# Patient Record
Sex: Male | Born: 1945
Health system: Southern US, Community
[De-identification: ages and names within clinical notes are randomized; demographics above are authoritative.]

## PROBLEM LIST (undated history)

## (undated) DIAGNOSIS — E785 Hyperlipidemia, unspecified: Secondary | ICD-10-CM

## (undated) DIAGNOSIS — I350 Nonrheumatic aortic (valve) stenosis: Secondary | ICD-10-CM

## (undated) DIAGNOSIS — K219 Gastro-esophageal reflux disease without esophagitis: Secondary | ICD-10-CM

## (undated) DIAGNOSIS — J45909 Unspecified asthma, uncomplicated: Secondary | ICD-10-CM

## (undated) DIAGNOSIS — M1990 Unspecified osteoarthritis, unspecified site: Secondary | ICD-10-CM

## (undated) DIAGNOSIS — I2510 Atherosclerotic heart disease of native coronary artery without angina pectoris: Secondary | ICD-10-CM

## (undated) DIAGNOSIS — E669 Obesity, unspecified: Secondary | ICD-10-CM

## (undated) DIAGNOSIS — D649 Anemia, unspecified: Secondary | ICD-10-CM

## (undated) DIAGNOSIS — N529 Male erectile dysfunction, unspecified: Secondary | ICD-10-CM

## (undated) DIAGNOSIS — K635 Polyp of colon: Secondary | ICD-10-CM

## (undated) DIAGNOSIS — N289 Disorder of kidney and ureter, unspecified: Secondary | ICD-10-CM

## (undated) DIAGNOSIS — G2581 Restless legs syndrome: Secondary | ICD-10-CM

## (undated) DIAGNOSIS — K5790 Diverticulosis of intestine, part unspecified, without perforation or abscess without bleeding: Secondary | ICD-10-CM

## (undated) DIAGNOSIS — K760 Fatty (change of) liver, not elsewhere classified: Secondary | ICD-10-CM

## (undated) DIAGNOSIS — R0989 Other specified symptoms and signs involving the circulatory and respiratory systems: Secondary | ICD-10-CM

## (undated) DIAGNOSIS — M109 Gout, unspecified: Secondary | ICD-10-CM

## (undated) DIAGNOSIS — I10 Essential (primary) hypertension: Secondary | ICD-10-CM

## (undated) HISTORY — DX: Unspecified osteoarthritis, unspecified site: M19.90

## (undated) HISTORY — DX: Diverticulosis of intestine, part unspecified, without perforation or abscess without bleeding: K57.90

## (undated) HISTORY — DX: Hyperlipidemia, unspecified: E78.5

## (undated) HISTORY — DX: Essential (primary) hypertension: I10

## (undated) HISTORY — DX: Male erectile dysfunction, unspecified: N52.9

## (undated) HISTORY — DX: Gout, unspecified: M10.9

## (undated) HISTORY — DX: Polyp of colon: K63.5

## (undated) HISTORY — DX: Unspecified asthma, uncomplicated: J45.909

## (undated) HISTORY — DX: Obesity, unspecified: E66.9

## (undated) HISTORY — DX: Gastro-esophageal reflux disease without esophagitis: K21.9

## (undated) HISTORY — DX: Disorder of kidney and ureter, unspecified: N28.9

## (undated) HISTORY — DX: Restless legs syndrome: G25.81

## (undated) HISTORY — DX: Fatty (change of) liver, not elsewhere classified: K76.0

## (undated) HISTORY — PX: LYMPH NODE BIOPSY: SHX201

## (undated) HISTORY — DX: Other specified symptoms and signs involving the circulatory and respiratory systems: R09.89

---

## 1999-04-19 ENCOUNTER — Encounter: Admission: RE | Admit: 1999-04-19 | Discharge: 1999-04-19 | Payer: Self-pay | Admitting: General Surgery

## 1999-04-19 ENCOUNTER — Encounter: Payer: Self-pay | Admitting: General Surgery

## 1999-04-20 ENCOUNTER — Ambulatory Visit (HOSPITAL_BASED_OUTPATIENT_CLINIC_OR_DEPARTMENT_OTHER): Admission: RE | Admit: 1999-04-20 | Discharge: 1999-04-20 | Payer: Self-pay | Admitting: General Surgery

## 1999-04-20 ENCOUNTER — Encounter (INDEPENDENT_AMBULATORY_CARE_PROVIDER_SITE_OTHER): Payer: Self-pay | Admitting: *Deleted

## 1999-05-05 ENCOUNTER — Encounter: Payer: Self-pay | Admitting: *Deleted

## 1999-05-05 ENCOUNTER — Emergency Department (HOSPITAL_COMMUNITY): Admission: EM | Admit: 1999-05-05 | Discharge: 1999-05-05 | Payer: Self-pay | Admitting: *Deleted

## 2000-05-28 ENCOUNTER — Encounter: Payer: Self-pay | Admitting: Family Medicine

## 2000-05-28 ENCOUNTER — Encounter: Admission: RE | Admit: 2000-05-28 | Discharge: 2000-05-28 | Payer: Self-pay | Admitting: Family Medicine

## 2000-11-04 ENCOUNTER — Encounter: Admission: RE | Admit: 2000-11-04 | Discharge: 2000-11-04 | Payer: Self-pay | Admitting: Family Medicine

## 2000-11-04 ENCOUNTER — Encounter: Payer: Self-pay | Admitting: Family Medicine

## 2004-08-15 ENCOUNTER — Ambulatory Visit: Payer: Self-pay | Admitting: Family Medicine

## 2004-08-21 ENCOUNTER — Ambulatory Visit: Payer: Self-pay | Admitting: Internal Medicine

## 2004-08-21 ENCOUNTER — Encounter: Admission: RE | Admit: 2004-08-21 | Discharge: 2004-08-21 | Payer: Self-pay | Admitting: Family Medicine

## 2004-08-29 ENCOUNTER — Ambulatory Visit: Payer: Self-pay | Admitting: Family Medicine

## 2004-09-04 ENCOUNTER — Ambulatory Visit: Payer: Self-pay | Admitting: Internal Medicine

## 2004-10-31 ENCOUNTER — Ambulatory Visit: Payer: Self-pay | Admitting: Family Medicine

## 2005-09-18 ENCOUNTER — Ambulatory Visit: Payer: Self-pay | Admitting: Family Medicine

## 2005-09-24 ENCOUNTER — Ambulatory Visit: Payer: Self-pay | Admitting: Family Medicine

## 2006-04-07 ENCOUNTER — Ambulatory Visit: Payer: Self-pay | Admitting: Family Medicine

## 2006-04-10 ENCOUNTER — Ambulatory Visit: Payer: Self-pay | Admitting: Family Medicine

## 2006-04-17 ENCOUNTER — Ambulatory Visit: Payer: Self-pay | Admitting: Family Medicine

## 2006-04-29 ENCOUNTER — Ambulatory Visit: Payer: Self-pay | Admitting: Family Medicine

## 2006-05-01 ENCOUNTER — Ambulatory Visit: Payer: Self-pay | Admitting: Family Medicine

## 2006-05-05 ENCOUNTER — Ambulatory Visit: Payer: Self-pay | Admitting: Family Medicine

## 2006-05-05 ENCOUNTER — Ambulatory Visit: Payer: Self-pay

## 2006-05-05 LAB — CONVERTED CEMR LAB
BUN: 15 mg/dL (ref 6–23)
Creatinine, Ser: 1.3 mg/dL (ref 0.4–1.5)
Potassium: 4.2 meq/L (ref 3.5–5.1)

## 2006-05-08 ENCOUNTER — Ambulatory Visit: Payer: Self-pay | Admitting: Cardiovascular Disease

## 2006-05-08 ENCOUNTER — Ambulatory Visit: Payer: Self-pay | Admitting: Family Medicine

## 2006-05-20 ENCOUNTER — Ambulatory Visit: Payer: Self-pay | Admitting: Family Medicine

## 2006-05-28 ENCOUNTER — Ambulatory Visit: Payer: Self-pay | Admitting: Family Medicine

## 2006-07-28 ENCOUNTER — Ambulatory Visit: Payer: Self-pay | Admitting: Family Medicine

## 2006-11-27 ENCOUNTER — Encounter: Payer: Self-pay | Admitting: Family Medicine

## 2006-11-27 DIAGNOSIS — E785 Hyperlipidemia, unspecified: Secondary | ICD-10-CM | POA: Insufficient documentation

## 2006-11-27 DIAGNOSIS — I10 Essential (primary) hypertension: Secondary | ICD-10-CM

## 2006-11-27 DIAGNOSIS — M1990 Unspecified osteoarthritis, unspecified site: Secondary | ICD-10-CM | POA: Insufficient documentation

## 2006-11-27 DIAGNOSIS — J309 Allergic rhinitis, unspecified: Secondary | ICD-10-CM

## 2006-11-27 DIAGNOSIS — K219 Gastro-esophageal reflux disease without esophagitis: Secondary | ICD-10-CM

## 2007-03-03 ENCOUNTER — Telehealth: Payer: Self-pay | Admitting: Family Medicine

## 2007-04-06 ENCOUNTER — Ambulatory Visit: Payer: Self-pay | Admitting: Family Medicine

## 2007-04-06 DIAGNOSIS — R609 Edema, unspecified: Secondary | ICD-10-CM

## 2007-05-22 ENCOUNTER — Ambulatory Visit: Payer: Self-pay | Admitting: Family Medicine

## 2007-05-22 LAB — CONVERTED CEMR LAB
ALT: 21 units/L (ref 0–53)
AST: 22 units/L (ref 0–37)
Basophils Relative: 0.4 % (ref 0.0–1.0)
Bilirubin, Direct: 0.2 mg/dL (ref 0.0–0.3)
CO2: 32 meq/L (ref 19–32)
Calcium: 9.2 mg/dL (ref 8.4–10.5)
Chloride: 105 meq/L (ref 96–112)
Eosinophils Relative: 7.2 % — ABNORMAL HIGH (ref 0.0–5.0)
Glucose, Bld: 138 mg/dL — ABNORMAL HIGH (ref 70–99)
HDL: 36.1 mg/dL — ABNORMAL LOW (ref >39.0)
Ketones, urine, test strip: NEGATIVE
Lymphocytes Relative: 41.6 % (ref 12.0–46.0)
Neutro Abs: 2.2 10*3/uL (ref 1.4–7.7)
Nitrite: NEGATIVE
Platelets: 187 10*3/uL (ref 150–400)
RBC: 4.1 M/uL — ABNORMAL LOW (ref 4.22–5.81)
Total Protein: 5.9 g/dL — ABNORMAL LOW (ref 6.0–8.3)
Urobilinogen, UA: 0.2
VLDL: 25 mg/dL (ref 0–40)
WBC Urine, dipstick: NEGATIVE
WBC: 5.3 10*3/uL (ref 4.5–10.5)

## 2007-06-05 ENCOUNTER — Ambulatory Visit: Payer: Self-pay | Admitting: Family Medicine

## 2007-07-07 ENCOUNTER — Ambulatory Visit: Payer: Self-pay | Admitting: Family Medicine

## 2007-07-07 DIAGNOSIS — H6120 Impacted cerumen, unspecified ear: Secondary | ICD-10-CM

## 2007-07-14 ENCOUNTER — Telehealth (INDEPENDENT_AMBULATORY_CARE_PROVIDER_SITE_OTHER): Payer: Self-pay | Admitting: *Deleted

## 2007-08-18 ENCOUNTER — Telehealth: Payer: Self-pay | Admitting: Family Medicine

## 2007-08-24 ENCOUNTER — Telehealth (INDEPENDENT_AMBULATORY_CARE_PROVIDER_SITE_OTHER): Payer: Self-pay | Admitting: *Deleted

## 2007-08-26 ENCOUNTER — Telehealth (INDEPENDENT_AMBULATORY_CARE_PROVIDER_SITE_OTHER): Payer: Self-pay | Admitting: *Deleted

## 2007-09-08 ENCOUNTER — Telehealth (INDEPENDENT_AMBULATORY_CARE_PROVIDER_SITE_OTHER): Payer: Self-pay | Admitting: *Deleted

## 2007-10-29 ENCOUNTER — Ambulatory Visit: Payer: Self-pay | Admitting: Family Medicine

## 2007-11-03 ENCOUNTER — Encounter: Payer: Self-pay | Admitting: Family Medicine

## 2007-11-09 ENCOUNTER — Encounter: Payer: Self-pay | Admitting: Family Medicine

## 2007-12-02 ENCOUNTER — Encounter: Payer: Self-pay | Admitting: Family Medicine

## 2007-12-22 ENCOUNTER — Ambulatory Visit: Payer: Self-pay | Admitting: Internal Medicine

## 2008-01-04 ENCOUNTER — Ambulatory Visit: Payer: Self-pay | Admitting: Family Medicine

## 2008-01-07 ENCOUNTER — Telehealth: Payer: Self-pay | Admitting: Family Medicine

## 2008-04-15 ENCOUNTER — Telehealth: Payer: Self-pay | Admitting: *Deleted

## 2008-04-22 ENCOUNTER — Ambulatory Visit: Payer: Self-pay | Admitting: Family Medicine

## 2008-04-22 LAB — CONVERTED CEMR LAB
Direct LDL: 160 mg/dL
Ferritin: 29.5 ng/mL (ref 22.0–322.0)
Total CHOL/HDL Ratio: 6.6
VLDL: 27 mg/dL (ref 0–40)

## 2008-04-25 ENCOUNTER — Telehealth: Payer: Self-pay | Admitting: *Deleted

## 2008-05-03 ENCOUNTER — Telehealth: Payer: Self-pay | Admitting: Family Medicine

## 2008-05-23 ENCOUNTER — Telehealth: Payer: Self-pay | Admitting: Family Medicine

## 2008-05-31 ENCOUNTER — Telehealth: Payer: Self-pay | Admitting: Family Medicine

## 2008-06-15 ENCOUNTER — Telehealth: Payer: Self-pay | Admitting: *Deleted

## 2008-06-20 ENCOUNTER — Telehealth: Payer: Self-pay | Admitting: *Deleted

## 2008-06-23 ENCOUNTER — Telehealth: Payer: Self-pay | Admitting: *Deleted

## 2008-08-31 ENCOUNTER — Telehealth: Payer: Self-pay | Admitting: Family Medicine

## 2008-09-19 ENCOUNTER — Telehealth: Payer: Self-pay | Admitting: Family Medicine

## 2008-09-22 ENCOUNTER — Telehealth: Payer: Self-pay | Admitting: Family Medicine

## 2008-09-27 ENCOUNTER — Telehealth: Payer: Self-pay | Admitting: Family Medicine

## 2008-09-28 ENCOUNTER — Telehealth: Payer: Self-pay | Admitting: Family Medicine

## 2008-10-24 ENCOUNTER — Telehealth: Payer: Self-pay | Admitting: Family Medicine

## 2008-10-26 ENCOUNTER — Ambulatory Visit: Payer: Self-pay | Admitting: Family Medicine

## 2008-10-26 LAB — CONVERTED CEMR LAB
AST: 25 units/L (ref 0–37)
Albumin: 3.8 g/dL (ref 3.5–5.2)
Alkaline Phosphatase: 47 units/L (ref 39–117)
BUN: 23 mg/dL (ref 6–23)
Basophils Absolute: 0 10*3/uL (ref 0.0–0.1)
Bilirubin, Direct: 0.1 mg/dL (ref 0.0–0.3)
Blood in Urine, dipstick: NEGATIVE
Chloride: 112 meq/L (ref 96–112)
Cholesterol: 227 mg/dL — ABNORMAL HIGH (ref 0–200)
Creatinine, Ser: 1.2 mg/dL (ref 0.4–1.5)
Eosinophils Absolute: 0.3 10*3/uL (ref 0.0–0.7)
Glucose, Bld: 128 mg/dL — ABNORMAL HIGH (ref 70–99)
HCT: 34.1 % — ABNORMAL LOW (ref 39.0–52.0)
Lymphs Abs: 1.7 10*3/uL (ref 0.7–4.0)
MCHC: 34.2 g/dL (ref 30.0–36.0)
MCV: 88.4 fL (ref 78.0–100.0)
Monocytes Absolute: 0.5 10*3/uL (ref 0.1–1.0)
Monocytes Relative: 8.2 % (ref 3.0–12.0)
Neutro Abs: 3 10*3/uL (ref 1.4–7.7)
Nitrite: NEGATIVE
Platelets: 164 10*3/uL (ref 150.0–400.0)
RDW: 12.2 % (ref 11.5–14.6)
Specific Gravity, Urine: 1.025
TSH: 1.45 microintl units/mL (ref 0.35–5.50)
Total CHOL/HDL Ratio: 6
WBC Urine, dipstick: NEGATIVE

## 2008-11-08 ENCOUNTER — Ambulatory Visit: Payer: Self-pay | Admitting: Family Medicine

## 2008-11-08 DIAGNOSIS — R0989 Other specified symptoms and signs involving the circulatory and respiratory systems: Secondary | ICD-10-CM

## 2009-02-07 ENCOUNTER — Telehealth: Payer: Self-pay | Admitting: Family Medicine

## 2009-02-08 ENCOUNTER — Encounter: Payer: Self-pay | Admitting: *Deleted

## 2009-03-23 ENCOUNTER — Ambulatory Visit: Payer: Self-pay | Admitting: Family Medicine

## 2009-03-23 DIAGNOSIS — G2581 Restless legs syndrome: Secondary | ICD-10-CM

## 2009-03-23 DIAGNOSIS — N529 Male erectile dysfunction, unspecified: Secondary | ICD-10-CM

## 2009-05-05 ENCOUNTER — Ambulatory Visit: Payer: Self-pay | Admitting: Family Medicine

## 2009-08-03 ENCOUNTER — Encounter (INDEPENDENT_AMBULATORY_CARE_PROVIDER_SITE_OTHER): Payer: Self-pay | Admitting: *Deleted

## 2009-08-10 ENCOUNTER — Telehealth: Payer: Self-pay | Admitting: Family Medicine

## 2009-08-15 ENCOUNTER — Telehealth: Payer: Self-pay | Admitting: Family Medicine

## 2009-11-15 ENCOUNTER — Ambulatory Visit: Payer: Self-pay | Admitting: Family Medicine

## 2009-11-15 LAB — CONVERTED CEMR LAB
ALT: 27 units/L (ref 0–53)
AST: 26 units/L (ref 0–37)
BUN: 18 mg/dL (ref 6–23)
Basophils Relative: 1 % (ref 0.0–3.0)
Bilirubin, Direct: 0.1 mg/dL (ref 0.0–0.3)
Blood in Urine, dipstick: NEGATIVE
Chloride: 106 meq/L (ref 96–112)
Creatinine, Ser: 1.1 mg/dL (ref 0.4–1.5)
Eosinophils Relative: 6.4 % — ABNORMAL HIGH (ref 0.0–5.0)
GFR calc non Af Amer: 75.47 mL/min (ref >60)
HCT: 33.5 % — ABNORMAL LOW (ref 39.0–52.0)
HDL: 41.8 mg/dL (ref >39.00)
MCV: 87.6 fL (ref 78.0–100.0)
Monocytes Absolute: 0.6 10*3/uL (ref 0.1–1.0)
Monocytes Relative: 9 % (ref 3.0–12.0)
Neutrophils Relative %: 53.3 % (ref 43.0–77.0)
Nitrite: NEGATIVE
Platelets: 189 10*3/uL (ref 150.0–400.0)
Potassium: 4.5 meq/L (ref 3.5–5.1)
RBC: 3.83 M/uL — ABNORMAL LOW (ref 4.22–5.81)
Total Bilirubin: 0.7 mg/dL (ref 0.3–1.2)
Total CHOL/HDL Ratio: 6
Total Protein: 6.3 g/dL (ref 6.0–8.3)
Urobilinogen, UA: 0.2
VLDL: 31.2 mg/dL (ref 0.0–40.0)
WBC Urine, dipstick: NEGATIVE
WBC: 6.1 10*3/uL (ref 4.5–10.5)

## 2009-11-23 ENCOUNTER — Ambulatory Visit: Payer: Self-pay | Admitting: Family Medicine

## 2009-12-14 ENCOUNTER — Telehealth: Payer: Self-pay | Admitting: Family Medicine

## 2009-12-18 ENCOUNTER — Ambulatory Visit: Payer: Self-pay | Admitting: Family Medicine

## 2009-12-18 DIAGNOSIS — D631 Anemia in chronic kidney disease: Secondary | ICD-10-CM | POA: Insufficient documentation

## 2009-12-18 DIAGNOSIS — N189 Chronic kidney disease, unspecified: Secondary | ICD-10-CM

## 2009-12-19 LAB — CONVERTED CEMR LAB
BUN: 25 mg/dL — ABNORMAL HIGH (ref 6–23)
CO2: 28 meq/L (ref 19–32)
Chloride: 113 meq/L — ABNORMAL HIGH (ref 96–112)
Creatinine, Ser: 1.5 mg/dL (ref 0.4–1.5)
Glucose, Bld: 103 mg/dL — ABNORMAL HIGH (ref 70–99)
Potassium: 4.6 meq/L (ref 3.5–5.1)
Saturation Ratios: 10.3 % — ABNORMAL LOW (ref 20.0–50.0)
Transferrin: 276.7 mg/dL (ref 212.0–360.0)

## 2010-01-11 ENCOUNTER — Encounter (HOSPITAL_COMMUNITY): Admission: RE | Admit: 2010-01-11 | Discharge: 2010-01-16 | Payer: Self-pay | Admitting: Nephrology

## 2010-04-09 ENCOUNTER — Telehealth: Payer: Self-pay | Admitting: Family Medicine

## 2010-04-12 ENCOUNTER — Encounter (HOSPITAL_COMMUNITY)
Admission: RE | Admit: 2010-04-12 | Discharge: 2010-07-03 | Payer: Self-pay | Source: Home / Self Care | Attending: Nephrology | Admitting: Nephrology

## 2010-05-07 ENCOUNTER — Encounter (INDEPENDENT_AMBULATORY_CARE_PROVIDER_SITE_OTHER): Payer: Self-pay | Admitting: *Deleted

## 2010-06-03 LAB — HM COLONOSCOPY

## 2010-06-11 ENCOUNTER — Encounter (INDEPENDENT_AMBULATORY_CARE_PROVIDER_SITE_OTHER): Payer: Self-pay | Admitting: *Deleted

## 2010-06-13 ENCOUNTER — Telehealth: Payer: Self-pay | Admitting: Family Medicine

## 2010-06-15 ENCOUNTER — Ambulatory Visit
Admission: RE | Admit: 2010-06-15 | Discharge: 2010-06-15 | Payer: Self-pay | Source: Home / Self Care | Attending: Internal Medicine | Admitting: Internal Medicine

## 2010-06-19 ENCOUNTER — Telehealth: Payer: Self-pay | Admitting: Family Medicine

## 2010-06-26 ENCOUNTER — Other Ambulatory Visit: Payer: Self-pay | Admitting: Internal Medicine

## 2010-06-26 ENCOUNTER — Ambulatory Visit
Admission: RE | Admit: 2010-06-26 | Discharge: 2010-06-26 | Payer: Self-pay | Source: Home / Self Care | Attending: Internal Medicine | Admitting: Internal Medicine

## 2010-07-02 ENCOUNTER — Encounter: Payer: Self-pay | Admitting: Internal Medicine

## 2010-07-03 NOTE — Therapy (Signed)
Summary: Audiology Evaluation/[facility]  Audiology Evaluation/[facility]   Imported By: Maryln Gottron 02/15/2010 14:47:54  _____________________________________________________________________  External Attachment:    Type:   Image     Comment:   External Document

## 2010-07-03 NOTE — Assessment & Plan Note (Signed)
Summary: CPX // RS   Vital Signs:  Patient profile:   65 year old male Height:      65.5 inches Weight:      259 pounds BMI:     42.60 Temp:     98.8 degrees F oral BP sitting:   140 / 72  (left arm) Cuff size:   regular  Vitals Entered By: Kern Reap CMA Duncan Dull) (November 23, 2009 3:03 PM) CC: cpx Is Patient Diabetic? No   CC:  cpx.  History of Present Illness: Todd Spears is a 65 year old male, who comes in today for evaluation of multiple issues.  He has underlying hypertension, for which he takes Cardura 8 mg daily, labetalol 400 mg b.i.d., Lasix, 40 mg t.i.d., Zestril 80 mg daily, clonidine, .1, if his BP goes over 170,..exforge -- 320 daily.  He also sees the renal folks Dr. Gabriel Rung  He takes Nasonex nasal spray for allergic rhinitis, along with Zyrtec 10 mg plane nightly  He also uses Nexium 40 mg daily for reflux esophagitis.  He uses Viagra 50 mg p.r.n. for ED.  He also takes frequent to 1 mg daily however, he states is not working very well.  We discussed increasing the dose.  He also takes tramadol 50 mg two tabs t.i.d. p.r.n. from his orthopedist.  Tetanus 2004 seasonal flu 2010 Pneumovax 2006 information given on shingles  Allergies (verified): No Known Drug Allergies  Past History:  Past medical, surgical, family and social histories (including risk factors) reviewed, and no changes noted (except as noted below).  Past Medical History: Reviewed history from 11/27/2006 and no changes required. Allergic rhinitis GERD Hyperlipidemia Hypertension Osteoarthritis ED R CAROTID BRUIT  Past Surgical History: Reviewed history from 11/27/2006 and no changes required. LYMPHOMA L SIDE OF WUJW11'  Family History: Reviewed history from 04/06/2007 and no changes required. Family History Hypertension Family History of Stroke F 1st degree relative <60  Social History: Reviewed history from 04/06/2007 and no changes required. Retired Married Never Smoked Alcohol  use-no Drug use-no Regular exercise-no  Review of Systems      See HPI  Physical Exam  General:  Well-developed,well-nourished,in no acute distress; alert,appropriate and cooperative throughout examination Head:  Normocephalic and atraumatic without obvious abnormalities. No apparent alopecia or balding. Eyes:  No corneal or conjunctival inflammation noted. EOMI. Perrla. Funduscopic exam benign, without hemorrhages, exudates or papilledema. Vision grossly normal. Ears:  External ear exam shows no significant lesions or deformities.  Otoscopic examination reveals clear canals, tympanic membranes are intact bilaterally without bulging, retraction, inflammation or discharge. Hearing is grossly normal bilaterally. Nose:  External nasal examination shows no deformity or inflammation. Nasal mucosa are pink and moist without lesions or exudates. Mouth:  Oral mucosa and oropharynx without lesions or exudates.  Teeth in good repair. Neck:  No deformities, masses, or tenderness noted. Chest Wall:  No deformities, masses, tenderness or gynecomastia noted. Breasts:  No masses or gynecomastia noted Lungs:  Normal respiratory effort, chest expands symmetrically. Lungs are clear to auscultation, no crackles or wheezes. Heart:  Normal rate and regular rhythm. S1 and S2 normal without gallop, murmur, click, rub or other extra sounds. Abdomen:  Bowel sounds positive,abdomen soft and non-tender without masses, organomegaly or hernias noted. Rectal:  No external abnormalities noted. Normal sphincter tone. No rectal masses or tenderness. Genitalia:  Testes bilaterally descended without nodularity, tenderness or masses. No scrotal masses or lesions. No penis lesions or urethral discharge. Prostate:  Prostate gland firm and smooth, no enlargement, nodularity, tenderness,  mass, asymmetry or induration. Msk:  No deformity or scoliosis noted of thoracic or lumbar spine.   Pulses:  R and L  carotid,radial,femoral,dorsalis pedis and posterior tibial pulses are full and equal bilaterally Extremities:  No clubbing, cyanosis, edema, or deformity noted with normal full range of motion of all joints.   Neurologic:  No cranial nerve deficits noted. Station and gait are normal. Plantar reflexes are down-going bilaterally. DTRs are symmetrical throughout. Sensory, motor and coordinative functions appear intact. Skin:  Intact without suspicious lesions or rashes Cervical Nodes:  No lymphadenopathy noted Axillary Nodes:  No palpable lymphadenopathy Inguinal Nodes:  No significant adenopathy Psych:  Cognition and judgment appear intact. Alert and cooperative with normal attention span and concentration. No apparent delusions, illusions, hallucinations   Impression & Recommendations:  Problem # 1:  PHYSICAL EXAMINATION (ICD-V70.0) Assessment Unchanged  Orders: Prescription Created Electronically [phone])  Problem # 2:  ERECTILE DYSFUNCTION, ORGANIC (ICD-607.84) Assessment: Improved  His updated medication list for this problem includes:    Viagra 50 Mg Tabs (Sildenafil citrate) ..... Uad  Orders: Prescription Created Electronically [phone])  Problem # 3:  RESTLESS LEG SYNDROME (ICD-333.94) Assessment: Deteriorated  Orders: Prescription Created Electronically [phone])  Problem # 4:  CAROTID BRUIT, RIGHT (ICD-785.9) Assessment: Unchanged  Orders: Prescription Created Electronically [phone])  Problem # 5:  HYPERTENSION (ICD-401.9) Assessment: Improved  His updated medication list for this problem includes:    Doxazosin Mesylate 8 Mg Tabs (Doxazosin mesylate) .Marland Kitchen... Take 1 tablet by mouth once a day    Labetalol Hcl 200 Mg Tabs (Labetalol hcl) .Marland Kitchen... 2 tablet by mouth twice a day    Lasix 40 Mg Tabs (Furosemide) .Marland Kitchen... Take 1 tablet by mouth three times a day    Zestril 40 Mg Tabs (Lisinopril) .Marland Kitchen... Take 1 tablet by mouth two times a day    Clonidine Hcl 0.1 Mg Tabs (Clonidine  hcl) ..... One tablet every hour for systolic blood pressure in excess of 170    Exforge 10-320 Mg Tabs (Amlodipine besylate-valsartan) .Marland Kitchen... Take one tablet by mouth once daily  Orders: Prescription Created Electronically [phone]) EKG w/ Interpretation (93000)  Problem # 6:  GERD (ICD-530.81) Assessment: Improved  His updated medication list for this problem includes:    Nexium 40 Mg Cpdr (Esomeprazole magnesium) .Marland Kitchen... Take one tab by mouth once daily  Orders: Prescription Created Electronically [phone])  Complete Medication List: 1)  Aspir-81 81 Mg Tbec (Aspirin) .... Take 1 tablet by mouth once a day 2)  Doxazosin Mesylate 8 Mg Tabs (Doxazosin mesylate) .... Take 1 tablet by mouth once a day 3)  Labetalol Hcl 200 Mg Tabs (Labetalol hcl) .... 2 tablet by mouth twice a day 4)  Lasix 40 Mg Tabs (Furosemide) .... Take 1 tablet by mouth three times a day 5)  Nasonex 50 Mcg/act Susp (Mometasone furoate) .... Spray 2 spray as directed once a day 6)  Nexium 40 Mg Cpdr (Esomeprazole magnesium) .... Take one tab by mouth once daily 7)  Zyrtec Allergy 10 Mg Tabs (Cetirizine hcl) .... Once daily 8)  Zestril 40 Mg Tabs (Lisinopril) .... Take 1 tablet by mouth two times a day 9)  Clonidine Hcl 0.1 Mg Tabs (Clonidine hcl) .... One tablet every hour for systolic blood pressure in excess of 170 10)  Exforge 10-320 Mg Tabs (Amlodipine besylate-valsartan) .... Take one tablet by mouth once daily 11)  Meloxicam 15 Mg Tabs (Meloxicam) .... Take one tab once daily 12)  Anucort-hc 25 Mg Supp (Hydrocortisone acetate) .... Insert  one at bedtime 13)  Viagra 50 Mg Tabs (Sildenafil citrate) .... Uad 14)  Tramadol Hcl 50 Mg Tabs (Tramadol hcl) .... Take 2 tabs every 6-8 hours as needed pain 15)  Metaxalone 800 Mg Tabs (Metaxalone) .... Take one tab by mouth two times a day 16)  Requip 2 Mg Tabs (Ropinirole hcl) .Marland Kitchen.. 1 tab @ bedtime  Patient Instructions: 1)  Please schedule a follow-up appointment in 1  year. 2)  It is important that you exercise regularly at least 20 minutes 5 times a week. If you develop chest pain, have severe difficulty breathing, or feel very tired , stop exercising immediately and seek medical attention. Prescriptions: REQUIP 2 MG TABS (ROPINIROLE HCL) 1 tab @ bedtime  #100 x 3   Entered and Authorized by:   Roderick Pee MD   Signed by:   Roderick Pee MD on 11/23/2009   Method used:   Print then Give to Patient   RxID:   [id number] VIAGRA 50 MG TABS (SILDENAFIL CITRATE) UAD  #6 x 11   Entered and Authorized by:   Roderick Pee MD   Signed by:   Roderick Pee MD on 11/23/2009   Method used:   Print then Give to Patient   RxID:   [id number] EXFORGE 10-320 MG TABS (AMLODIPINE BESYLATE-VALSARTAN) take one tablet by mouth once daily  #100 x 3   Entered and Authorized by:   Roderick Pee MD   Signed by:   Roderick Pee MD on 11/23/2009   Method used:   Print then Give to Patient   RxID:   [id number] CLONIDINE HCL 0.1 MG  TABS (CLONIDINE HCL) one tablet every hour for systolic blood pressure in excess of 170  #90 x 0   Entered and Authorized by:   Roderick Pee MD   Signed by:   Roderick Pee MD on 11/23/2009   Method used:   Print then Give to Patient   RxID:   [id number] ZESTRIL 40 MG  TABS (LISINOPRIL) Take 1 tablet by mouth two times a day  #200 x 3   Entered and Authorized by:   Roderick Pee MD   Signed by:   Roderick Pee MD on 11/23/2009   Method used:   Print then Give to Patient   RxID:   [id number] NEXIUM 40 MG CPDR (ESOMEPRAZOLE MAGNESIUM) take one tab by mouth once daily  #100 x 3   Entered and Authorized by:   Roderick Pee MD   Signed by:   Roderick Pee MD on 11/23/2009   Method used:   Print then Give to Patient   RxID:   [id number] NASONEX 50 MCG/ACT SUSP (MOMETASONE FUROATE) Spray 2 spray as directed once a day  #3 x 4   Entered and Authorized by:   Roderick Pee MD   Signed by:   Roderick Pee  MD on 11/23/2009   Method used:   Print then Give to Patient   RxID:   [id number] LASIX 40 MG TABS (FUROSEMIDE) Take 1 tablet by mouth three times a day  #300 x 3   Entered and Authorized by:   Roderick Pee MD   Signed by:   Roderick Pee MD on 11/23/2009   Method used:   Print then Give to Patient   RxID:   [id number] LABETALOL HCL 200 MG TABS (LABETALOL HCL) 2 tablet by mouth twice a day  #400 x 3  Entered and Authorized by:   Roderick Pee MD   Signed by:   Roderick Pee MD on 11/23/2009   Method used:   Print then Give to Patient   RxID:   [id number] DOXAZOSIN MESYLATE 8 MG TABS (DOXAZOSIN MESYLATE) Take 1 tablet by mouth once a day  #100 x 3   Entered and Authorized by:   Roderick Pee MD   Signed by:   Roderick Pee MD on 11/23/2009   Method used:   Print then Give to Patient   RxID:   [id number] REQUIP 2 MG TABS (ROPINIROLE HCL) 1 tab @ bedtime  #100 x 3   Entered and Authorized by:   Roderick Pee MD   Signed by:   Roderick Pee MD on 11/23/2009   Method used:   Electronically to        [facility]  [phone]* (retail)       [address]       [place], [place]  [address]       Ph: [phone] or [phone]       Fax: [phone]   RxID:   [id number] VIAGRA 50 MG TABS (SILDENAFIL CITRATE) UAD  #6 x 11   Entered and Authorized by:   Roderick Pee MD   Signed by:   Roderick Pee MD on 11/23/2009   Method used:   Electronically to        [facility]  [phone]* (retail)       [address]       [place], [place]  [address]       Ph: [phone] or [phone]       Fax: [phone]   RxID:   [id number] EXFORGE 10-320 MG TABS (AMLODIPINE BESYLATE-VALSARTAN) take one tablet by mouth once daily  #100 x 3   Entered and Authorized by:   Roderick Pee MD   Signed by:   Roderick Pee MD on 11/23/2009   Method used:   Electronically to        [facility]  [phone]* (retail)       [address]       [place], [place]  [address]       Ph: [phone] or [phone]       Fax: [phone]   RxID:   [id number] CLONIDINE HCL 0.1 MG  TABS (CLONIDINE HCL) one tablet every hour for systolic blood pressure in excess of 170  #90 x 0   Entered and Authorized by:   Roderick Pee MD   Signed by:   Roderick Pee MD on 11/23/2009   Method used:   Electronically to        [facility]  [phone]* (retail)       [address]       [place], [place]  [address]       Ph: [phone] or [phone]       Fax: [phone]   RxID:   [id number] ZESTRIL 40 MG  TABS (LISINOPRIL) Take 1 tablet by mouth two times a day  #200 x 3   Entered and Authorized by:   Roderick Pee MD   Signed by:   Roderick Pee MD on 11/23/2009   Method  used:   Electronically to        [facility]  [phone]* (retail)       [address]       [place], [place]  [address]       Ph: [phone] or [phone]       Fax: [phone]   RxID:   [id number] NEXIUM 40 MG CPDR (ESOMEPRAZOLE MAGNESIUM) take one tab by mouth once daily  #100 x 3   Entered and Authorized by:   Roderick Pee MD   Signed by:   Roderick Pee MD on 11/23/2009   Method used:   Electronically to        [facility]  [phone]* (retail)       [address]       [place], [place]  [address]       Ph: [phone] or [phone]       Fax: [phone]   RxID:   [id number] NASONEX 50 MCG/ACT SUSP (MOMETASONE FUROATE) Spray 2 spray as directed once a day  #3 x 4   Entered and Authorized by:   Roderick Pee MD   Signed by:   Roderick Pee MD on 11/23/2009   Method used:   Electronically to        [facility]  [phone]* (retail)       [address]       [place], [place]  [address]       Ph: [phone] or  [phone]       Fax: [phone]   RxID:   [id number] LASIX 40 MG TABS (FUROSEMIDE) Take 1 tablet by mouth three times a day  #300 x 3   Entered and Authorized by:   Roderick Pee MD   Signed by:   Roderick Pee MD on 11/23/2009   Method used:   Electronically to        [facility]  [phone]* (retail)       [address]       [place], [place]  [address]       Ph: [phone] or [phone]       Fax: [phone]   RxID:   [id number] LABETALOL HCL 200 MG TABS (LABETALOL HCL) 2 tablet by mouth twice a day  #400 x 3   Entered and Authorized by:   Roderick Pee MD   Signed by:   Roderick Pee MD on 11/23/2009   Method used:   Electronically to        [facility]  [phone]* (retail)       [address]       [place], [place]  [address]       Ph: [phone] or [phone]       Fax: [phone]   RxID:   [id number] DOXAZOSIN MESYLATE 8 MG TABS (DOXAZOSIN MESYLATE) Take 1 tablet by mouth once a day  #100 x 3   Entered and Authorized by:   Roderick Pee MD   Signed by:   Roderick Pee MD on 11/23/2009   Method used:   Electronically to        [facility]  [phone]* (retail)  [address]       [place], [place]  [address]       Ph: [phone] or [phone]       Fax: [phone]   RxID:   [id number]

## 2010-07-03 NOTE — Letter (Signed)
Summary: [facility]   [facility]   Imported By: Maryln Gottron 02/15/2010 14:42:28  _____________________________________________________________________  External Attachment:    Type:   Image     Comment:   External Document

## 2010-07-03 NOTE — Letter (Signed)
Summary: Pre Visit Letter Revised  [facility]  [address] [place], [place] [address]   Phone: [phone]  Fax: [phone]        05/07/2010 MRN: [id number] Todd Spears [address] [place], [place]  [address]             Procedure Date:  06/26/2010   Welcome to the Gastroenterology Division at [facility].    You are scheduled to see a nurse for your pre-procedure visit on 06/12/2010 at 8:30 AM on the 3rd floor at [facility], [address]  We ask that you try to arrive at our office 15 minutes prior to your appointment time to allow for check-in.  Please take a minute to review the attached form.  If you answer "Yes" to one or more of the questions on the first page, we ask that you call the person listed at your earliest opportunity.  If you answer "No" to all of the questions, please complete the rest of the form and bring it to your appointment.    Your nurse visit will consist of discussing your medical and surgical history, your immediate family medical history, and your medications.   If you are unable to list all of your medications on the form, please bring the medication bottles to your appointment and we will list them.  We will need to be aware of both prescribed and over the counter drugs.  We will need to know exact dosage information as well.    Please be prepared to read and sign documents such as consent forms, a financial agreement, and acknowledgement forms.  If necessary, and with your consent, a friend or relative is welcome to sit-in on the nurse visit with you.  Please bring your insurance card so that we may make a copy of it.  If your insurance requires a referral to see a specialist, please bring your referral form from your primary care physician.  No co-pay is required for this nurse visit.     If you cannot keep your appointment, please call [phone] to cancel or reschedule prior to your appointment date.  This  allows [place] the opportunity to schedule an appointment for another patient in need of care.    Thank you for choosing [facility] for your medical needs.  We appreciate the opportunity to care for you.  Please visit [place] at our website  to learn more about our practice.  Sincerely, The Gastroenterology Division

## 2010-07-03 NOTE — Progress Notes (Signed)
Summary: refills  Phone Note Refill Request Message from:  Fax from Pharmacy on August 15, 2009 11:47 AM  Refills Requested: Medication #1:  LABETALOL HCL 200 MG TABS 2 tablet by mouth twice a day  Medication #2:  ZESTRIL 40 MG  TABS Take 1 tablet by mouth two times a day  Method Requested: Fax to Local Pharmacy Initial call taken by: Kern Reap CMA Duncan Dull),  August 15, 2009 11:47 AM    Prescriptions: ZESTRIL 40 MG  TABS (LISINOPRIL) Take 1 tablet by mouth two times a day  #200 x 3   Entered by:   Kern Reap CMA (AAMA)   Authorized by:   Roderick Pee MD   Signed by:   Kern Reap CMA (AAMA) on 08/15/2009   Method used:   Electronically to        [facility]* (mail-order)             ,          Ph: [phone]       Fax: [phone]   RxID:   [id number] LABETALOL HCL 200 MG TABS (LABETALOL HCL) 2 tablet by mouth twice a day  #400 x 3   Entered by:   Kern Reap CMA (AAMA)   Authorized by:   Roderick Pee MD   Signed by:   Kern Reap CMA (AAMA) on 08/15/2009   Method used:   Electronically to        [facility]* (mail-order)             ,          Ph: [phone]       Fax: [phone]   RxID:   [id number]

## 2010-07-03 NOTE — Progress Notes (Signed)
Summary: Rx  Phone Note Call from Patient Call back at Work Phone [phone]   Caller: Patient Call For: Roderick Pee MD Summary of Call: wants 90-day supply Ropinirole and wants to pick up. Initial call taken by: Raechel Ache, RN,  August 10, 2009 1:12 PM  Follow-up for Phone Call        rx up front ready for p/u, pt aware Follow-up by: Alfred Levins, CMA,  August 10, 2009 2:24 PM    Prescriptions: REQUIP 1 MG TABS (ROPINIROLE HCL) 1 tab @ bedtime  #90 x 3   Entered by:   Kern Reap CMA (AAMA)   Authorized by:   Roderick Pee MD   Signed by:   Kern Reap CMA (AAMA) on 08/10/2009   Method used:   Print then Give to Patient   RxID:   [id number]

## 2010-07-03 NOTE — Letter (Signed)
Summary: Colonoscopy Letter  [facility]  [address] [place], [place] [address]   Phone: [phone]  Fax: [phone]      August 03, 2009 MRN: [id number]   Aria Kiddy [address] [place], [place]  [address]   Dear Mr. DEARMAS,   According to your medical record, it is time for you to schedule a Colonoscopy. The American Cancer Society recommends this procedure as a method to detect early colon cancer. Patients with a family history of colon cancer, or a personal history of colon polyps or inflammatory bowel disease are at increased risk.  This letter has beeen generated based on the recommendations made at the time of your procedure. If you feel that in your particular situation this may no longer apply, please contact our office.  Please call our office at [phone] to schedule this appointment or to update your records at your earliest convenience.  Thank you for cooperating with [place] to provide you with the very best care possible.   Sincerely,  Wilhemina Bonito. Marina Goodell, M.D.  [facility] Gastroenterology Division [phone]

## 2010-07-03 NOTE — Progress Notes (Signed)
Summary: rachel--please return call  Phone Note Call from Patient Call back at Work Phone [phone] Call back at [phone]   Caller: Patient---live call Summary of Call: was told to call [facility].  ( no further message was given.) Initial call taken by: Warnell Forester,  December 14, 2009 12:23 PM  Follow-up for Phone Call        patient wife would like a shingles shot.  patient would like a referral for his leg cramps is this okay to do? Follow-up by: Kern Reap CMA Duncan Dull),  December 14, 2009 5:29 PM  Additional Follow-up for Phone Call Additional follow up Details #1::        Phone Call Completed Additional Follow-up by: Kern Reap CMA Duncan Dull),  December 19, 2009 3:06 PM

## 2010-07-03 NOTE — Therapy (Signed)
Summary: Audiology Evaluation/[facility]  Audiology Evaluation/[facility]   Imported By: Maryln Gottron 02/15/2010 14:46:31  _____________________________________________________________________  External Attachment:    Type:   Image     Comment:   External Document

## 2010-07-03 NOTE — Letter (Signed)
Summary: [facility]  [facility]   Imported By: Maryln Gottron 02/15/2010 14:43:34  _____________________________________________________________________  External Attachment:    Type:   Image     Comment:   External Document

## 2010-07-03 NOTE — Progress Notes (Signed)
Summary: REQUEST FOR RX / REFERRAL  Phone Note Call from Patient   Caller: Patient     [phone] Summary of Call: Pt would like a Rx for Colchicine to be sent to [facility] on [address] [facility], pt has been exp pain from gout..... Pt would also like a referral to a podiatrist for eval of knots / nodules on feet..... Pt was offered OV but adv he couldn't come in till late Friday due to work (Dr Tawanna Cooler will be out of office).... Can you advise?   Initial call taken by: Debbra Riding,  April 09, 2010 4:00 PM  Follow-up for Phone Call        Fleet Contras please call............ I would recommend Dr. Johnny Bridge A. the podiatrist also colchicine is no longer made, if he's having trouble with his gout.  I need to see him next week for further valuation to discuss the treatment options Follow-up by: Roderick Pee MD,  April 10, 2010 5:30 PM  Additional Follow-up for Phone Call Additional follow up Details #1::        left message on machine for patient to return our call Additional Follow-up by: Kern Reap CMA Duncan Dull),  April 11, 2010 8:51 AM    Additional Follow-up for Phone Call Additional follow up Details #2::    spoke with patient  Follow-up by: Kern Reap CMA Duncan Dull),  April 12, 2010 10:49 AM

## 2010-07-03 NOTE — Assessment & Plan Note (Signed)
Summary: HIGH BP/RCD   Vital Signs:  Patient profile:   65 year old male Weight:      250 pounds Temp:     98.3 degrees F oral BP sitting:   130 / 70  (left arm) Cuff size:   regular  Vitals Entered By: Kern Reap CMA Duncan Dull) (December 18, 2009 8:44 AM) CC: blood pressure concerns   CC:  blood pressure concerns.  History of Present Illness: Todd Spears is a 65 year old, married male, nonsmoker, who comes in today for evaluation of hypotension.  About two weeks ago he began having 4 to 5 loose bowel movements daily.  He continued to take his normal medications which include labetalol 400 mg b.i.d., Lasix, 40 mg t.i.d., Zestril, 40 mg b.i.d., ex-forge -- 320 daily for his hypertension.  BP on Saturday dropped to 90/60.  The diarrhea has now abated.  He feels back to normal.  Allergies: No Known Drug Allergies PMH-FH-SH reviewed-no changes except otherwise noted  Review of Systems      See HPI  Physical Exam  General:  Well-developed,well-nourished,in no acute distress; alert,appropriate and cooperative throughout examination   Impression & Recommendations:  Problem # 1:  HYPERTENSION (ICD-401.9) Assessment Unchanged  His updated medication list for this problem includes:    Doxazosin Mesylate 8 Mg Tabs (Doxazosin mesylate) .Marland Kitchen... Take 1 tablet by mouth once a day    Labetalol Hcl 200 Mg Tabs (Labetalol hcl) .Marland Kitchen... 2 tablet by mouth twice a day    Lasix 40 Mg Tabs (Furosemide) .Marland Kitchen... Take 1 tablet by mouth three times a day    Zestril 40 Mg Tabs (Lisinopril) .Marland Kitchen... Take 1 tablet by mouth two times a day    Clonidine Hcl 0.1 Mg Tabs (Clonidine hcl) ..... One tablet every hour for systolic blood pressure in excess of 170    Exforge 10-320 Mg Tabs (Amlodipine besylate-valsartan) .Marland Kitchen... Take one tablet by mouth once daily  Orders: Venipuncture (54098) TLB-BMP (Basic Metabolic Panel-BMET) (80048-METABOL) TLB-B12 + Folate Pnl (11914_78295-A21/HYQ) TLB-IBC Pnl (Iron/FE;Transferrin)  (83550-IBC)  Complete Medication List: 1)  Aspir-81 81 Mg Tbec (Aspirin) .... Take 1 tablet by mouth once a day 2)  Doxazosin Mesylate 8 Mg Tabs (Doxazosin mesylate) .... Take 1 tablet by mouth once a day 3)  Labetalol Hcl 200 Mg Tabs (Labetalol hcl) .... 2 tablet by mouth twice a day 4)  Lasix 40 Mg Tabs (Furosemide) .... Take 1 tablet by mouth three times a day 5)  Nasonex 50 Mcg/act Susp (Mometasone furoate) .... Spray 2 spray as directed once a day 6)  Nexium 40 Mg Cpdr (Esomeprazole magnesium) .... Take one tab by mouth once daily 7)  Zyrtec Allergy 10 Mg Tabs (Cetirizine hcl) .... Once daily 8)  Zestril 40 Mg Tabs (Lisinopril) .... Take 1 tablet by mouth two times a day 9)  Clonidine Hcl 0.1 Mg Tabs (Clonidine hcl) .... One tablet every hour for systolic blood pressure in excess of 170 10)  Exforge 10-320 Mg Tabs (Amlodipine besylate-valsartan) .... Take one tablet by mouth once daily 11)  Meloxicam 15 Mg Tabs (Meloxicam) .... Take one tab once daily 12)  Anucort-hc 25 Mg Supp (Hydrocortisone acetate) .... Insert one at bedtime 13)  Viagra 50 Mg Tabs (Sildenafil citrate) .... Uad 14)  Tramadol Hcl 50 Mg Tabs (Tramadol hcl) .... Take 2 tabs every 6-8 hours as needed pain 15)  Metaxalone 800 Mg Tabs (Metaxalone) .... Take one tab by mouth two times a day 16)  Requip 2 Mg Tabs (Ropinirole hcl) .Marland KitchenMarland KitchenMarland Kitchen  1 tab @ bedtime  Patient Instructions: 1)  continue medications except hold the Lasix until you see Dr. Veleta Miners.  We will get electrolytes today.  I will call u  the report.  This afternoon

## 2010-07-05 NOTE — Letter (Signed)
Summary: [facility] Instructions  [facility]  [address] [place], [place] [address]   Phone: [phone]  Fax: [phone]       Todd Spears    Nov 01, 1945    MRN: [id number]        Procedure Day /Date:  Tuesday 06/26/2010     Arrival Time: 7:30 am     Procedure Time:8:30 am     Location of Procedure:                    _x _  [facility] (4th Floor)                        PREPARATION FOR COLONOSCOPY WITH MOVIPREP   Starting 5 days prior to your procedure Thursday 1/19 do not eat nuts, seeds, popcorn, corn, beans, peas,  salads, or any raw vegetables.  Do not take any fiber supplements (e.g. Metamucil, Citrucel, and Benefiber).  THE DAY BEFORE YOUR PROCEDURE         DATE: Monday 1/23  1.  Drink clear liquids the entire day-NO SOLID FOOD  2.  Do not drink anything colored red or purple.  Avoid juices with pulp.  No orange juice.  3.  Drink at least 64 oz. (8 glasses) of fluid/clear liquids during the day to prevent dehydration and help the prep work efficiently.  CLEAR LIQUIDS INCLUDE: Water Jello Ice Popsicles Tea (sugar ok, no milk/cream) Powdered fruit flavored drinks Coffee (sugar ok, no milk/cream) Gatorade Juice: apple, white grape, white cranberry  Lemonade Clear bullion, consomm, broth Carbonated beverages (any kind) Strained chicken noodle soup Hard Candy                             4.  In the morning, mix first dose of MoviPrep solution:    Empty 1 Pouch A and 1 Pouch B into the disposable container    Add lukewarm drinking water to the top line of the container. Mix to dissolve    Refrigerate (mixed solution should be used within 24 hrs)  5.  Begin drinking the prep at 5:00 p.m. The MoviPrep container is divided by 4 marks.   Every 15 minutes drink the solution down to the next mark (approximately 8 oz) until the full liter is complete.   6.  Follow completed prep with 16 oz of clear liquid of your choice (Nothing red  or purple).  Continue to drink clear liquids until bedtime.  7.  Before going to bed, mix second dose of MoviPrep solution:    Empty 1 Pouch A and 1 Pouch B into the disposable container    Add lukewarm drinking water to the top line of the container. Mix to dissolve    Refrigerate  THE DAY OF YOUR PROCEDURE      DATE: Tuesday 1/24  Beginning at 3:30 a.m. (5 hours before procedure):         1. Every 15 minutes, drink the solution down to the next mark (approx 8 oz) until the full liter is complete.  2. Follow completed prep with 16 oz. of clear liquid of your choice.    3. You may drink clear liquids until 6:30 am (2 HOURS BEFORE PROCEDURE).   MEDICATION INSTRUCTIONS  Unless otherwise instructed, you should take regular prescription medications with a small sip of water   as early as possible the morning of your procedure.  Additional medication instructions:  Hold lasix morning of procedure only!         OTHER INSTRUCTIONS  You will need a responsible adult at least 65 years of age to accompany you and drive you home.   This person must remain in the waiting room during your procedure.  Wear loose fitting clothing that is easily removed.  Leave jewelry and other valuables at home.  However, you may wish to bring a book to read or  an iPod/MP3 player to listen to music as you wait for your procedure to start.  Remove all body piercing jewelry and leave at home.  Total time from sign-in until discharge is approximately 2-3 hours.  You should go home directly after your procedure and rest.  You can resume normal activities the  day after your procedure.  The day of your procedure you should not:   Drive   Make legal decisions   Operate machinery   Drink alcohol   Return to work  You will receive specific instructions about eating, activities and medications before you leave.    The above instructions have been reviewed and explained to me by   Sherren Kerns RN  June 15, 2010 3:50 PM   I Fully understand and can verbalize these instructions _____________________________ Date _________

## 2010-07-05 NOTE — Progress Notes (Signed)
Summary: Pt needs name of podiatrist and rheumatologist referral  Phone Note Call from Patient Call back at Work Phone [phone]   Caller: Patient Summary of Call: Pt called and would like to get the name of the podiatrist again, that Dr. Tawanna Cooler referred him to. Also pt req a referral to Rheumatologist. Pls call.  Initial call taken by: Lucy Antigua,  June 19, 2010 4:01 PM  Follow-up for Phone Call        the podiatrist would be Dr. Johnny Bridge A.......Marland Kitchen rheumatologist would be Dr. Dareen Piano Follow-up by: Roderick Pee MD,  June 21, 2010 9:37 AM  Additional Follow-up for Phone Call Additional follow up Details #1::        patient is aware Additional Follow-up by: Kern Reap CMA Duncan Dull),  June 22, 2010 9:46 AM

## 2010-07-05 NOTE — Progress Notes (Signed)
Summary: Rx Refill  Phone Note Refill Request Message from:  Patient on June 13, 2010 8:07 AM  Refills Requested: Medication #1:  Lisinopril Needs 30 day supply sent to [facility] until [facility] order script comes in.   Method Requested: Electronic Initial call taken by: Trixie Dredge,  June 13, 2010 8:08 AM    Prescriptions: ZESTRIL 40 MG  TABS (LISINOPRIL) Take 1 tablet by mouth two times a day  #30 x 0   Entered by:   Kern Reap CMA (AAMA)   Authorized by:   Roderick Pee MD   Signed by:   Kern Reap CMA (AAMA) on 06/13/2010   Method used:   Electronically to        [facility]  [phone]* (retail)       [address]       [place], [place]  [address]       Ph: [phone] or [phone]       Fax: [phone]   RxID:   [id number]

## 2010-07-05 NOTE — Procedures (Addendum)
Summary: Colonoscopy  Patient: [name] Note: All result statuses are Final unless otherwise noted.  Tests: (1) Colonoscopy (COL)   COL Colonoscopy           DONE     [facility]     [address]     [place], [place]  [address]           COLONOSCOPY PROCEDURE REPORT           PATIENT:  Todd Spears, Todd Spears  MR#:  [id number]     BIRTHDATE:  02/02/1946, 65 yrs. old  GENDER:  male     ENDOSCOPIST:  Wilhemina Bonito. Eda Keys, MD     REF. BY:  Surveillance Program Recall,     PROCEDURE DATE:  06/26/2010     PROCEDURE:  Colonoscopy with snare polypectomy x 1     ASA CLASS:  Class III     INDICATIONS:  history of pre-cancerous (adenomatous) colon polyps,     surveillance and high-risk screening ; index exam 09-2004 w/ TA     MEDICATIONS:   Fentanyl 75 mcg IV, Versed 6 mg IV           DESCRIPTION OF PROCEDURE:   After the risks benefits and     alternatives of the procedure were thoroughly explained, informed     consent was obtained.  Digital rectal exam was performed and     revealed no abnormalities.   The LB 180AL [id number] endoscope was     introduced through the anus and advanced to the cecum, which was     identified by both the appendix and ileocecal valve, without     limitations.Time to cecum = 2:00 min.  The quality of the prep was     excellent, using MoviPrep.  The instrument was then slowly     withdrawn (time = 11:43 min) as the colon was fully examined.     <<PROCEDUREIMAGES>>           FINDINGS:  A diminutive polyp was found in the proximal transverse     colon. Polyp was snared without cautery. Retrieval was successful.     snare polyp  Moderate diverticulosis was found in the left colon.     This was otherwise a normal examination of the colon.   Retroflexed     views in the rectum revealed no abnormalities.    The scope was     then withdrawn from the patient and the procedure completed.           COMPLICATIONS:  None           ENDOSCOPIC IMPRESSION:     1)  Diminutive polyp in the proximal transverse colon - removed     2) Moderate diverticulosis in the left colon     3) Otherwise normal examination           RECOMMENDATIONS:     1) Follow up colonoscopy in 5 years           ______________________________     Wilhemina Bonito. Eda Keys, MD           CC:  The Patient;  Roderick Pee, MD           n.     Rosalie DoctorWilhemina Bonito. Eda Keys at 06/26/2010 09:08 AM           Danie Binder, [id number]  Note: An exclamation mark (!) indicates a result that was not dispersed  into the flowsheet. Document Creation Date: 06/26/2010 9:09 AM _______________________________________________________________________  (1) Order result status: Final Collection or observation date-time: 06/26/2010 09:03 Requested date-time:  Receipt date-time:  Reported date-time:  Referring Physician:   Ordering Physician: Fransico Setters [phone]) Specimen Source:  Source: Launa Grill Order Number: [id number] Lab site:   Appended Document: Colonoscopy recall 5 yrs     Procedures Next Due Date:    Colonoscopy: 06/2015

## 2010-07-05 NOTE — Miscellaneous (Signed)
Summary: previsit prep/rm  Clinical Lists Changes  Medications: Added new medication of MOVIPREP 100 GM  SOLR (PEG-KCL-NACL-NASULF-NA ASC-C) As per prep instructions. - Signed Rx of MOVIPREP 100 GM  SOLR (PEG-KCL-NACL-NASULF-NA ASC-C) As per prep instructions.;  #1 x 0;  Signed;  Entered by: Sherren Kerns RN;  Authorized by: Hilarie Fredrickson MD;  Method used: Electronically to [facility]  [phone]*, [address] [place], [place], [place]  [address], Ph: [phone] or [phone], Fax: [phone] Allergies: Added new allergy or adverse reaction of PREDNISONE Observations: Added new observation of ALLERGY REV: Done (06/15/2010 15:35) Added new observation of NKA: F (06/15/2010 15:35)    Prescriptions: MOVIPREP 100 GM  SOLR (PEG-KCL-NACL-NASULF-NA ASC-C) As per prep instructions.  #1 x 0   Entered by:   Sherren Kerns RN   Authorized by:   Hilarie Fredrickson MD   Signed by:   Sherren Kerns RN on 06/15/2010   Method used:   Electronically to        [facility]  [phone]* (retail)       [address]       [place], [place]  [address]       Ph: [phone] or [phone]       Fax: [phone]   RxID:   [id number]

## 2010-07-11 NOTE — Letter (Signed)
Summary: Patient Notice- Polyp Results  [facility]  [address] [place], [place] [address]   Phone: [phone]  Fax: [phone]        July 02, 2010 MRN: [id number]    Todd Spears [address] [place], [place]  [address]    Dear [name],  I am pleased to inform you that the colon polyp(s) removed during your recent colonoscopy was (were) found to be benign (no cancer detected) upon pathologic examination.  I recommend you have a repeat colonoscopy examination in 5 years to look for recurrent polyps, as having colon polyps increases your risk for having recurrent polyps or even colon cancer in the future.  Should you develop new or worsening symptoms of abdominal pain, bowel habit changes or bleeding from the rectum or bowels, please schedule an evaluation with either your primary care physician or with me.  Additional information/recommendations:  __ No further action with gastroenterology is needed at this time. Please      follow-up with your primary care physician for your other healthcare      needs.  Please call us if you are having persistent problems or have questions about your condition that have not been fully answered at this time.  Sincerely,  Hilarie Fredrickson MD  This letter has been electronically signed by your physician.  Appended Document: Patient Notice- Polyp Results LETTER MAILED

## 2010-07-20 ENCOUNTER — Telehealth: Payer: Self-pay | Admitting: *Deleted

## 2010-07-20 NOTE — Telephone Encounter (Signed)
Ok.............have the foot doctor set up a consult since they are the ones who are referring him

## 2010-07-20 NOTE — Telephone Encounter (Signed)
patient  Was sent to a foot doctor by his kidney doctor.  The foot doctor would like for the patient to go see a rheumatologist.  Is this okay?

## 2010-08-17 LAB — IRON AND TIBC: Iron: 111 ug/dL (ref 42–135)

## 2010-08-17 LAB — BASIC METABOLIC PANEL
BUN: 9 mg/dL (ref 6–23)
Calcium: 9.1 mg/dL (ref 8.4–10.5)
Creatinine, Ser: 1.13 mg/dL (ref 0.4–1.5)
GFR calc non Af Amer: >60 mL/min (ref >60)
Potassium: 3.3 mEq/L — ABNORMAL LOW (ref 3.5–5.1)

## 2010-10-25 ENCOUNTER — Other Ambulatory Visit: Payer: Self-pay | Admitting: Family Medicine

## 2010-11-12 ENCOUNTER — Other Ambulatory Visit: Payer: Self-pay | Admitting: Family Medicine

## 2010-11-13 ENCOUNTER — Telehealth: Payer: Self-pay | Admitting: *Deleted

## 2010-11-13 ENCOUNTER — Encounter: Payer: Self-pay | Admitting: Internal Medicine

## 2010-11-13 ENCOUNTER — Ambulatory Visit (INDEPENDENT_AMBULATORY_CARE_PROVIDER_SITE_OTHER): Payer: BC Managed Care – PPO | Admitting: Internal Medicine

## 2010-11-13 DIAGNOSIS — J309 Allergic rhinitis, unspecified: Secondary | ICD-10-CM

## 2010-11-13 DIAGNOSIS — IMO0002 Reserved for concepts with insufficient information to code with codable children: Secondary | ICD-10-CM

## 2010-11-13 DIAGNOSIS — M5416 Radiculopathy, lumbar region: Secondary | ICD-10-CM

## 2010-11-13 MED ORDER — ALBUTEROL 90 MCG/ACT IN AERS: 2.0000 | INHALATION_SPRAY | Freq: Four times a day (QID) | RESPIRATORY_TRACT | Status: DC | PRN

## 2010-11-13 MED ORDER — MONTELUKAST SODIUM 10 MG PO TABS: 10.0000 mg | ORAL_TABLET | Freq: Every day | ORAL | Status: DC

## 2010-11-13 NOTE — Assessment & Plan Note (Signed)
Obtain plain radiograph of the lumbar sacral spine. Attempt Medrol Dosepak. Followup closely if no improvement or worsening.

## 2010-11-13 NOTE — Assessment & Plan Note (Signed)
Change antihistamine to Allegra. Begin Singulair daily. Consider albuterol MDI when necessary.Followup if no improvement or worsening.

## 2010-11-13 NOTE — Progress Notes (Deleted)
  Subjective:    Patient ID: Todd Spears, male    DOB: 1946/05/08, 65 y.o.   MRN: [id number]  HPI Pt presents to clinic for evaluation of leg pain. two-month history of intermittent right low back pain that radiates to the right leg to the level of the ankle. No associated weakness however does have intermittent associated numbness on the bottom of her right foot. No injury or trauma. Has had history of cervical spine neck surgery. No exacerbating or alleviating factors. Did note recently a temporary soft tissue prominence right mid anterior leg near the tibia without injury or trauma. Resolved quickly within twenty-four hours. Denies leg swelling chest pain or shortness of breath. No known risks for thromboembolic disease. No complaints  Reviewed past medical history, medications and allergies.  Review of Systems  Respiratory: Negative for shortness of breath.   Cardiovascular: Negative for chest pain and leg swelling.  Musculoskeletal: Positive for back pain. Negative for myalgias, joint swelling, arthralgias and gait problem.  Neurological: Positive for numbness. Negative for weakness.       Objective:   Physical Exam  Nursing note and vitals reviewed. Constitutional: He appears well-developed and well-nourished. No distress.  HENT:  Head: Normocephalic and atraumatic.  Eyes: Conjunctivae are normal. No scleral icterus.  Musculoskeletal: He exhibits no edema.       Bilateral lower extremity strength 5/5. Gait normal. Modified straight leg raise negative on the right. No leg edema or palpable cords warmth/erythema  Skin: Skin is warm and dry. No rash noted. He is not diaphoretic. No erythema. No pallor.  Psychiatric: He has a normal mood and affect.          Assessment & Plan:

## 2010-11-13 NOTE — Progress Notes (Addendum)
  Subjective:    Patient ID: Todd Spears, male    DOB: 10/09/1945, 65 y.o.   MRN: [id number]  HPI Pt presents to clinic for evaluation of congestion. Notes chronic intermittent history over several years which he believes to be nasal congestion and drainage. Recently worse last week and notes a dusty environment. Symptoms are better at night and does involve nasal and throat congestion. Sometimes has a difficulty taking a deep breath but denies wheezing. Had a childhood history of asthma with no recent exacerbation. Has attempted and failed and Zyrtec as well as Claritin. Uses Nasonex on a regular basis. Denies fever chills cough chest pain or chest pressure. No other alleviating or exacerbating factors. No other complaints.  Reviewed past medical history, medications and allergies.  Review of Systems see hpi     Objective:   Physical Exam  Nursing note and vitals reviewed. Constitutional: He appears well-developed and well-nourished. No distress.  HENT:  Head: Normocephalic and atraumatic.  Right Ear: Tympanic membrane, external ear and ear canal normal.  Left Ear: Tympanic membrane, external ear and ear canal normal.  Nose: Mucosal edema and rhinorrhea present.  Mouth/Throat: Uvula is midline, oropharynx is clear and moist and mucous membranes are normal. No oropharyngeal exudate, posterior oropharyngeal edema, posterior oropharyngeal erythema or tonsillar abscesses.  Cardiovascular: Normal rate, regular rhythm and normal heart sounds.   Pulmonary/Chest: Effort normal and breath sounds normal. No respiratory distress. He has no wheezes. He has no rales.  Neurological: He is alert.  Skin: Skin is warm and dry. He is not diaphoretic.  Psychiatric: He has a normal mood and affect.          Assessment & Plan:

## 2010-11-13 NOTE — Progress Notes (Signed)
Addended by: Staci Righter on: 11/13/2010 06:05 PM   Modules accepted: Level of Service

## 2010-11-13 NOTE — Telephone Encounter (Signed)
Pt is having sob and can't take a deep breathe. Pt is not having heart problems or chest pain. Zyrtec is not helping. Pt to see Dr. Rodena Medin today.

## 2010-12-10 ENCOUNTER — Other Ambulatory Visit (INDEPENDENT_AMBULATORY_CARE_PROVIDER_SITE_OTHER): Payer: BC Managed Care – PPO

## 2010-12-10 DIAGNOSIS — Z0000 Encounter for general adult medical examination without abnormal findings: Secondary | ICD-10-CM

## 2010-12-10 LAB — HEPATIC FUNCTION PANEL
AST: 21 U/L (ref 0–37)
Albumin: 4.1 g/dL (ref 3.5–5.2)
Alkaline Phosphatase: 37 U/L — ABNORMAL LOW (ref 39–117)
Total Protein: 6.5 g/dL (ref 6.0–8.3)

## 2010-12-10 LAB — BASIC METABOLIC PANEL
CO2: 29 mEq/L (ref 19–32)
Calcium: 9.5 mg/dL (ref 8.4–10.5)
Chloride: 108 mEq/L (ref 96–112)
Potassium: 4.7 mEq/L (ref 3.5–5.1)
Sodium: 145 mEq/L (ref 135–145)

## 2010-12-10 LAB — CBC WITH DIFFERENTIAL/PLATELET
Basophils Relative: 1 % (ref 0.0–3.0)
Eosinophils Absolute: 0.2 10*3/uL (ref 0.0–0.7)
HCT: 34.8 % — ABNORMAL LOW (ref 39.0–52.0)
Hemoglobin: 11.8 g/dL — ABNORMAL LOW (ref 13.0–17.0)
Lymphocytes Relative: 31 % (ref 12.0–46.0)
Lymphs Abs: 1.9 10*3/uL (ref 0.7–4.0)
MCHC: 34 g/dL (ref 30.0–36.0)
Neutro Abs: 3.5 10*3/uL (ref 1.4–7.7)
RBC: 3.75 Mil/uL — ABNORMAL LOW (ref 4.22–5.81)

## 2010-12-10 LAB — PSA: PSA: 0.9 ng/mL (ref 0.10–4.00)

## 2010-12-10 LAB — POCT URINALYSIS DIPSTICK
Glucose, UA: NEGATIVE
Ketones, UA: NEGATIVE
Spec Grav, UA: 1.02

## 2010-12-10 LAB — LDL CHOLESTEROL, DIRECT: Direct LDL: 164.9 mg/dL

## 2010-12-10 LAB — TSH: TSH: 0.86 u[IU]/mL (ref 0.35–5.50)

## 2010-12-17 ENCOUNTER — Ambulatory Visit (INDEPENDENT_AMBULATORY_CARE_PROVIDER_SITE_OTHER): Payer: BC Managed Care – PPO | Admitting: Family Medicine

## 2010-12-17 ENCOUNTER — Encounter: Payer: Self-pay | Admitting: Family Medicine

## 2010-12-17 DIAGNOSIS — Z23 Encounter for immunization: Secondary | ICD-10-CM

## 2010-12-17 DIAGNOSIS — J309 Allergic rhinitis, unspecified: Secondary | ICD-10-CM

## 2010-12-17 DIAGNOSIS — E785 Hyperlipidemia, unspecified: Secondary | ICD-10-CM

## 2010-12-17 DIAGNOSIS — N529 Male erectile dysfunction, unspecified: Secondary | ICD-10-CM

## 2010-12-17 DIAGNOSIS — Z2911 Encounter for prophylactic immunotherapy for respiratory syncytial virus (RSV): Secondary | ICD-10-CM

## 2010-12-17 DIAGNOSIS — I10 Essential (primary) hypertension: Secondary | ICD-10-CM

## 2010-12-17 DIAGNOSIS — R0989 Other specified symptoms and signs involving the circulatory and respiratory systems: Secondary | ICD-10-CM

## 2010-12-17 DIAGNOSIS — K219 Gastro-esophageal reflux disease without esophagitis: Secondary | ICD-10-CM

## 2010-12-17 DIAGNOSIS — Z0000 Encounter for general adult medical examination without abnormal findings: Secondary | ICD-10-CM

## 2010-12-17 MED ORDER — METAXALONE 800 MG PO TABS: 800.0000 mg | ORAL_TABLET | Freq: Three times a day (TID) | ORAL | Status: DC

## 2010-12-17 MED ORDER — SILDENAFIL CITRATE 50 MG PO TABS: 50.0000 mg | ORAL_TABLET | Freq: Every day | ORAL | Status: DC | PRN

## 2010-12-17 MED ORDER — MOMETASONE FUROATE 50 MCG/ACT NA SUSP: 2.0000 | Freq: Every day | NASAL | Status: DC

## 2010-12-17 MED ORDER — MONTELUKAST SODIUM 10 MG PO TABS
10.0000 mg | ORAL_TABLET | Freq: Every day | ORAL | Status: DC
Start: 2010-12-17 — End: 2011-10-01

## 2010-12-17 MED ORDER — LABETALOL HCL 200 MG PO TABS: 200.0000 mg | ORAL_TABLET | ORAL | Status: DC

## 2010-12-17 MED ORDER — HALOBETASOL PROPIONATE 0.05 % EX CREA: TOPICAL_CREAM | Freq: Two times a day (BID) | CUTANEOUS | Status: DC

## 2010-12-17 MED ORDER — ALBUTEROL 90 MCG/ACT IN AERS: 2.0000 | INHALATION_SPRAY | Freq: Four times a day (QID) | RESPIRATORY_TRACT | Status: DC | PRN

## 2010-12-17 MED ORDER — DOXAZOSIN MESYLATE 8 MG PO TABS: 8.0000 mg | ORAL_TABLET | Freq: Every day | ORAL | Status: DC

## 2010-12-17 MED ORDER — ESOMEPRAZOLE MAGNESIUM 40 MG PO CPDR: 40.0000 mg | DELAYED_RELEASE_CAPSULE | Freq: Every day | ORAL | Status: DC

## 2010-12-17 MED ORDER — LISINOPRIL 40 MG PO TABS: 40.0000 mg | ORAL_TABLET | Freq: Every day | ORAL | Status: DC

## 2010-12-17 NOTE — Patient Instructions (Signed)
Continue your current medications.  Since y lood pressure is under good control.  Check it once weekly.  Return in one year or sooner if any problems.,,,,,,,,,,,,,,,,, walk 30 minutes daily

## 2010-12-17 NOTE — Progress Notes (Signed)
Subjective:    Patient ID: Todd Spears, male    DOB: [birth year], 65 y.o.   MRN: [id number]  HPI   Todd Spears is a 65 year old, married male, nonsmoker, who comes in today for evaluation of  Hypertension, allergic rhinitis, asthma, restless leg syndrome,  His current medications for hypertension, are Norvasc, 10 mg daily, Cardura 8 mg nightly, labetalolSerum iron level normal.  CBC normal chem 14, normal. 300 mg q.a.m., lisinopril 40 mg daily.  Blood pressure 138/80.  He stopped the Lasix because of leg cramps.  He takes OTC antihistamines steroid nasal spray, and Singulair for allergic rhinitis and asthma.  He uses Proventil p.r.n.  He uses Nexium 40 mg daily for reflux esophagitis.  Viagra 50 mg p.r.n. For ED.  He brings in some lab work that he had done at Dr. Standley Dakins office on July the fifth.  The lab work showed normal liver functions GFR of 63, normal urine   Normal and chem 14, normal.  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  This occurred about 3 years ago.  He woke up one morning.  With total hearing loss in his left ear.  He gets routine dental care, colonoscopy, normal 2012, tetanus, 2004, Pneumovax 2006, information given on shingles.  Activities of daily living.  He walks every day.  Home health safety reviewed.  No issues identified, no gas in the house, he does have a healthcare power of attorney and living will.  Cognitive function, normal    Review of Systems  Constitutional: Negative.   HENT: Negative.   Eyes: Negative.   Respiratory: Negative.   Cardiovascular: Negative.   Gastrointestinal: Negative.   Genitourinary: Negative.   Musculoskeletal: Negative.   Skin: Negative.   Neurological: Negative.   Hematological: Negative.   Psychiatric/Behavioral: Negative.        Objective:   Physical Exam  Constitutional: He is oriented to person, place, and time. He appears well-developed and well-nourished.    HENT:  Head: Normocephalic and atraumatic.  Right Ear: External ear normal.  Left Ear: External ear normal.  Nose: Nose normal.  Mouth/Throat: Oropharynx is clear and moist.  Eyes: Conjunctivae and EOM are normal. Pupils are equal, round, and reactive to light.  Neck: Normal range of motion. Neck supple. No JVD present. No tracheal deviation present. No thyromegaly present.  Cardiovascular: Normal rate, regular rhythm, normal heart sounds and intact distal pulses.  Exam reveals no gallop and no friction rub.   No murmur heard.      Soft bilateral carotid bruits.  The one on the right is a low, but louder than the one on the left, but still soft  Pulmonary/Chest: Effort normal and breath sounds normal. No stridor. No respiratory distress. He has no wheezes. He has no rales. He exhibits no tenderness.  Abdominal: Soft. Bowel sounds are normal. He exhibits no distension and no mass. There is no tenderness. There is no rebound and no guarding.  Genitourinary: Rectum normal, prostate normal and penis normal. Guaiac negative stool. No penile tenderness.  Musculoskeletal: Normal range of motion. He exhibits no edema and no tenderness.  Lymphadenopathy:    He has no cervical adenopathy.  Neurological: He is alert and oriented to person, place, and time. He has normal reflexes. No cranial nerve deficit. He exhibits normal muscle tone.  Skin: Skin is warm and dry. No rash noted. No erythema. No pallor.  Psychiatric: He has a normal mood  and affect. His behavior is normal. Judgment and thought content normal.          Assessment & Plan:  Healthy male.  Hypertension, under good control with current treatment program.  History of allergic rhinitis and asthma.  Continue above therapy.  Erectile dysfunction continue Viagra 50 mg p.r.n.  Bilateral carotid bruits, asymptomatic.  Advised to continue his diet, exercise and weight loss follow-up with me in one year or sooner if any problems

## 2010-12-19 ENCOUNTER — Other Ambulatory Visit: Payer: Self-pay | Admitting: *Deleted

## 2010-12-20 ENCOUNTER — Other Ambulatory Visit: Payer: Self-pay | Admitting: Family Medicine

## 2011-03-22 ENCOUNTER — Ambulatory Visit (INDEPENDENT_AMBULATORY_CARE_PROVIDER_SITE_OTHER): Payer: BC Managed Care – PPO | Admitting: Family Medicine

## 2011-03-22 ENCOUNTER — Encounter: Payer: Self-pay | Admitting: Family Medicine

## 2011-03-22 VITALS — BP 110/70 | Temp 98.8°F | Wt 242.0 lb

## 2011-03-22 DIAGNOSIS — Z23 Encounter for immunization: Secondary | ICD-10-CM

## 2011-03-22 NOTE — Progress Notes (Signed)
  Subjective:    Patient ID: Todd Spears, male    DOB: 1945/07/28, 65 y.o.   MRN: [id number]  HPI  Bilateral ear pressure. History of cerumen impactions previously. No hearing loss. No drainage. Denies dizziness. Patient requesting flu vaccine   Review of Systems  Constitutional: Negative for fever and chills.  HENT: Negative for hearing loss, ear pain, tinnitus and ear discharge.        Objective:   Physical Exam  Constitutional: He appears well-developed and well-nourished.  HENT:       Bilateral cerumen impactions  Cardiovascular: Normal rate and regular rhythm.   Pulmonary/Chest: Effort normal and breath sounds normal. No respiratory distress. He has no wheezes. He has no rales.          Assessment & Plan:  Cerumen impactions. Removed with irrigation. Patient tolerated well

## 2011-03-25 ENCOUNTER — Encounter: Payer: Self-pay | Admitting: Family Medicine

## 2011-03-25 ENCOUNTER — Ambulatory Visit (INDEPENDENT_AMBULATORY_CARE_PROVIDER_SITE_OTHER): Payer: BC Managed Care – PPO | Admitting: Family Medicine

## 2011-03-25 DIAGNOSIS — R011 Cardiac murmur, unspecified: Secondary | ICD-10-CM

## 2011-03-25 DIAGNOSIS — J309 Allergic rhinitis, unspecified: Secondary | ICD-10-CM

## 2011-03-25 DIAGNOSIS — I10 Essential (primary) hypertension: Secondary | ICD-10-CM

## 2011-03-25 NOTE — Patient Instructions (Signed)
Hold the Lopressor until you talk to Dr. Kem Boroughs  I will set up a cardiac evaluation for evaluation of a heart murmur and the shortness of breath.  If your allergies persist, I would recommend a consult with Dr. Reather Converse

## 2011-03-25 NOTE — Progress Notes (Signed)
  Subjective:    Patient ID: Todd Spears, male    DOB: [birth year], 65 y.o.   MRN: [id number]  HPI Todd Spears is a 65 year old male, who comes in today for evaluation of 3 problems.  stopped hisLabetalol and put him on a diuretic, 25 mg daily, and Lopressor, 50 mg daily.  Yesterday at work.  He stood up, got lightheaded and almost passed out.  He took his blood pressure was 85/50.  Because of his history of chronic renal insufficiency.  I recommend he call Dr. Shela Spears. C. For consultation in the meantime hold the lisinopril.  BP today 110/70, pulse 70 and regular.  He states for the past, year.  He's had symptoms of shortness of breath.  He describes this as a constant sensation.  It's unrelated to walking.  It does not wake up at night short of breath.  He has no history of any cardiac disease except when he was here last week.  He saw Dr. Sharman Spears to told him he had a heart murmur.  He has a history of underlying allergic rhinitis.  He was given Singulair, which did not seem to help his allergy symptoms.  Nothing seems to help   Review of Systems    General cardiac review of systems otherwise negative Objective:   Physical Exam  Obese male, in no acute distress.  Lungs are clear to auscultation.  Cardiac exam shows a grade 1 murmur heard around the aortic area.  No diastolic murmur      Assessment & Plan:  Shortness of breath, unknown etiology.  Heart murmur, recent onset cardiac eval  Allergic rhinitis.  Consult with Dr. Willa Spears if symptoms persist.  An episode of hypotension consult with Dr. Kem Spears in the meantime hold the Lopressor

## 2011-04-26 ENCOUNTER — Institutional Professional Consult (permissible substitution): Payer: BC Managed Care – PPO | Admitting: Cardiology

## 2011-07-18 ENCOUNTER — Encounter: Payer: Self-pay | Admitting: Family Medicine

## 2011-07-18 ENCOUNTER — Ambulatory Visit (INDEPENDENT_AMBULATORY_CARE_PROVIDER_SITE_OTHER): Payer: BC Managed Care – PPO | Admitting: Family Medicine

## 2011-07-18 VITALS — BP 110/62 | Temp 98.4°F | Wt 246.0 lb

## 2011-07-18 DIAGNOSIS — M1990 Unspecified osteoarthritis, unspecified site: Secondary | ICD-10-CM

## 2011-07-18 NOTE — Progress Notes (Signed)
  Subjective:    Patient ID: Todd Spears, male    DOB: 22-Feb-1946, 66 y.o.   MRN: [id number]  HPI Todd Spears is a 66 year old married male nonsmoker who comes in today for evaluation of pain and weakness in his lower extremities  He states for the last couple months he's had pain in his right and left hip. He says the pain is worse at night when he lies on that particular side. That he rolls over to the other side in the sun hurts. He then states he has difficulty getting up if he gets down to a difficulty standing up. He went to see Dr. Magnus Ivan and had a complete orthopedic evaluation which apparently was negative. He is frustrated because he's not sure what the diagnosis is.  No fever chills weight loss nausea vomiting diarrhea or change in urinary habits. Medication reviewed there have been no changes. He is not on a statin.   Review of Systems    general neuromuscular neurologic review of systems otherwise negative Objective:   Physical Exam Well-developed well-nourished overweight male in no acute distress examination of the lower extremities in the supine position. The legs appear normal skin normal pulses normal knees hips ankles normal full range of motion no tenderness sensation muscle strength reflexes gait all within normal limits       Assessment & Plan:  Pain right and left hip with muscle weakness with standing etiology unknown plan neurologic consult with Dr. Modesto Charon

## 2011-07-18 NOTE — Patient Instructions (Signed)
We will get you set up a consult to see Dr. Denton Meek neurologist for further evaluation

## 2011-07-30 ENCOUNTER — Other Ambulatory Visit: Payer: Self-pay | Admitting: Family Medicine

## 2011-07-30 DIAGNOSIS — J309 Allergic rhinitis, unspecified: Secondary | ICD-10-CM

## 2011-07-30 MED ORDER — MOMETASONE FUROATE 50 MCG/ACT NA SUSP: 2.0000 | Freq: Every day | NASAL | Status: DC

## 2011-07-30 NOTE — Telephone Encounter (Signed)
Pt need new rx nasonex #3 with 3 [facility]

## 2011-08-09 ENCOUNTER — Encounter: Payer: Self-pay | Admitting: Neurology

## 2011-09-10 ENCOUNTER — Telehealth: Payer: Self-pay | Admitting: Family Medicine

## 2011-09-10 DIAGNOSIS — I10 Essential (primary) hypertension: Secondary | ICD-10-CM

## 2011-09-10 MED ORDER — LISINOPRIL 40 MG PO TABS: 40.0000 mg | ORAL_TABLET | Freq: Every day | ORAL | Status: DC

## 2011-09-10 NOTE — Telephone Encounter (Signed)
Pleas contact pt about lisinopril (PRINIVIL,ZESTRIL) 40 MG tablet. Pt state that his rx was recently changed and that he ran out of medication early and needs a 10 day supply called into pharmacy along with a new script. Please contact pt

## 2011-09-11 ENCOUNTER — Other Ambulatory Visit: Payer: Self-pay | Admitting: Family Medicine

## 2011-09-11 DIAGNOSIS — I10 Essential (primary) hypertension: Secondary | ICD-10-CM

## 2011-09-11 NOTE — Telephone Encounter (Signed)
Pt needs 12 day supply of lisinopril 40 mg twice a day call into [facility]. Pt also needs [facility] lisinopril 40 mg twice a day #180 with 3 refills

## 2011-09-12 MED ORDER — LISINOPRIL 40 MG PO TABS: 40.0000 mg | ORAL_TABLET | Freq: Two times a day (BID) | ORAL | Status: DC

## 2011-10-01 ENCOUNTER — Ambulatory Visit (INDEPENDENT_AMBULATORY_CARE_PROVIDER_SITE_OTHER): Payer: BC Managed Care – PPO | Admitting: Neurology

## 2011-10-01 ENCOUNTER — Encounter: Payer: Self-pay | Admitting: Neurology

## 2011-10-01 VITALS — BP 116/60 | HR 60 | Ht 66.0 in | Wt 247.0 lb

## 2011-10-01 DIAGNOSIS — R200 Anesthesia of skin: Secondary | ICD-10-CM

## 2011-10-01 DIAGNOSIS — R209 Unspecified disturbances of skin sensation: Secondary | ICD-10-CM

## 2011-10-01 MED ORDER — GABAPENTIN 300 MG PO CAPS: ORAL_CAPSULE | ORAL | Status: DC

## 2011-10-01 NOTE — Progress Notes (Signed)
Dear Dr. Tawanna Cooler,  Thank you for having me see Todd Spears in consultation today at [facility] for his problem with bilateral leg pain.  As you may recall, he is a 66 y.o. year old male with a history of hyperlipidemia and hypertension as well as suddent left side sensorineural hearing loss who has had a problem with bilateral leg pain that is worse with lying or sitting.  It alternates sides, and is particularly bad when he is sleeping.  If he lies on the left it effects the left leg and when he lies on the right it effects the right leg.  He has had Xrays taken of his knee and hips and these have been unremarkable.  He denies significant back pain or worsening with back movement.  He says the pain is "aching" pain that radiates from the hip to the foot.  He denies numbness burning or tingling.  He denies worsening with cough or relief with bending forward.  He has had no changes in his bladder function.    He has a history of sudden loss of hearing in his left ear.  No known cause.  MRI done in June 2009 was unremarkable.  Past Medical History  Diagnosis Date  . Allergic rhinitis   . GERD (gastroesophageal reflux disease)   . Hyperlipidemia   . Hypertension   . Osteoarthritis   . ED (erectile dysfunction)   . Carotid bruit     r    Past Surgical History  Procedure Date  . Nm pet lymphoma     left side of neck 1999    History   Social History  . Marital Status: Married    Spouse Name: N/A    Number of Children: N/A  . Years of Education: N/A   Social History Main Topics  . Smoking status: Never Smoker   . Smokeless tobacco: Never Used  . Alcohol Use: No  . Drug Use: No  . Sexually Active: None   Other Topics Concern  . None   Social History Narrative   RetiredMarriedRegular exercise- no    Family History  Problem Relation Age of Onset  . Hypertension    . Stroke      Current Outpatient Prescriptions on File Prior to Visit  Medication Sig Dispense  Refill  . albuterol (PROVENTIL,VENTOLIN) 90 MCG/ACT inhaler Inhale 2 puffs into the lungs every 6 (six) hours as needed for wheezing.  17 g  2  . amLODipine (NORVASC) 10 MG tablet       . cetirizine (ZYRTEC) 10 MG tablet Take 10 mg by mouth daily.        . chlorthalidone (HYGROTON) 25 MG tablet Take 25 mg by mouth. Half tab in the morning       . doxazosin (CARDURA) 8 MG tablet Take 1 tablet (8 mg total) by mouth at bedtime.  100 tablet  3  . esomeprazole (NEXIUM) 40 MG capsule Take 1 capsule (40 mg total) by mouth daily before breakfast.  100 capsule  3  . halobetasol (ULTRAVATE) 0.05 % cream Apply topically 2 (two) times daily.  50 g  4  . lisinopril (PRINIVIL,ZESTRIL) 40 MG tablet Take 1 tablet (40 mg total) by mouth 2 (two) times daily.  180 tablet  3  . metoprolol (LOPRESSOR) 50 MG tablet Take 50 mg by mouth daily.        . mometasone (NASONEX) 50 MCG/ACT nasal spray Place 2 sprays into the nose daily.  17 g  3  . sildenafil (VIAGRA) 50 MG tablet Take 1 tablet (50 mg total) by mouth daily as needed.  10 tablet  6  . gabapentin (NEURONTIN) 300 MG capsule increase to 1 capsule three times per day as directed.  90 capsule  5    Allergies  Allergen Reactions  . Prednisone     REACTION: swelling, SOB      ROS:  13 systems were reviewed and are notable for joint pain and swelling, arthritis, vertigo.  All other review of systems are unremarkable.   Examination:  Filed Vitals:   10/01/11 1502  BP: 116/60  Pulse: 60  Height: 5\' 6"  (1.676 m)  Weight: 247 lb (112.038 kg)     In general, obese appearing man.  Cardiovascular: The patient has a regular rate and rhythm.  Fundoscopy:  Disks are flat. Vessel caliber within normal limits.  Mental status:   The patient is oriented to person, place and time. Recent and remote memory are intact. Attention span and concentration are normal. Language including repetition, naming, following commands are intact. Fund of knowledge of current  and historical events, as well as vocabulary are normal.  Cranial Nerves: Pupils are equally round and reactive to light. Visual fields full to confrontation. EOMs reveal full versions but fatiguable upbeating nystagmus on upgaze as well as on right and left gaze. Facial sensation and muscles of mastication are intact. Muscles of facial expression are symmetric. Hearing decreased to finger run on the left.  Weber lateralizes, right.  A>C on left.  Tongue protrusion, uvula, palate midline.  Shoulder shrug intact  Motor:  The patient has normal bulk and tone, no pronator drift.  There are no adventitious movements.  5/5 muscle strength bilaterally except for 4+/5 right hip flexor Reflexes:   Biceps  Triceps Brachioradialis Knee Ankle  Right 2+  2+  2+   2+ 0  Left  2+  2+  2+   2+ 0  Toes down  Coordination:  Normal finger to nose.  No dysdiadokinesia.  Sensation is intact to temperature and vibration.  Gait and Station are normal.   Romberg is negative.  SLR - bilaterally.  IR/ER of hips do not cause pain.  MRI brain was reviewed from 2009 and was unremarkable.   Impression/Recs:  Bilateral leg pain ? lumbar stenosis/radiculopathy.  We will get an EMG/NCS of his bilateral lower extremities as well as an MRI of his L-spine.  I have also started him on titration of gabapentin 300 tid.  He will call us after EMG/NCS to determine next steps.   We will see the patient back in 3 months.  Thank you for having [place] see Todd Spears in consultation.  Feel free to contact me with any questions.  Lupita Raider Modesto Charon, MD [facility], [place] [address] [place], [place] [address] Phone: [phone] Fax: [phone].

## 2011-10-01 NOTE — Patient Instructions (Addendum)
Your MRI is scheduled for Saturday, May 4th at 8:00am.  Please arrive to [facility] MRI by 7:45am. [phone].  We will call you with your appointment for the nerve conduction studies/electromyelogram they will be done at [facility] [address] [place], [place] [phone].   Neurontin( generic name gabapentin) 300mg  capsules  Take 1 capsule at night for 5 days, then 1 capsule two times per day for 5 days, and then 1 capsule three times a day from then on.

## 2011-10-02 ENCOUNTER — Telehealth: Payer: Self-pay | Admitting: Neurology

## 2011-10-02 NOTE — Telephone Encounter (Signed)
Spoke with Todd Spears. Appointment given to him for the NCV/EMG that is scheduled for November 04, 2011 at 10:45 am; to arrive 15 minutes prior to the appointment. Scheduled at [facility] in [facility].  No other issues voiced at this time.

## 2011-10-05 ENCOUNTER — Ambulatory Visit (HOSPITAL_COMMUNITY)
Admission: RE | Admit: 2011-10-05 | Discharge: 2011-10-05 | Disposition: A | Discharge status: Home / Self Care | Payer: BC Managed Care – PPO | Source: Ambulatory Visit | Attending: Neurology | Admitting: Neurology

## 2011-10-05 DIAGNOSIS — M79609 Pain in unspecified limb: Secondary | ICD-10-CM | POA: Insufficient documentation

## 2011-10-05 DIAGNOSIS — R200 Anesthesia of skin: Secondary | ICD-10-CM

## 2011-10-05 DIAGNOSIS — M5137 Other intervertebral disc degeneration, lumbosacral region: Secondary | ICD-10-CM | POA: Insufficient documentation

## 2011-10-05 DIAGNOSIS — M51379 Other intervertebral disc degeneration, lumbosacral region without mention of lumbar back pain or lower extremity pain: Secondary | ICD-10-CM | POA: Insufficient documentation

## 2011-10-05 DIAGNOSIS — M5126 Other intervertebral disc displacement, lumbar region: Secondary | ICD-10-CM | POA: Insufficient documentation

## 2011-10-08 ENCOUNTER — Telehealth: Payer: Self-pay | Admitting: Neurology

## 2011-10-08 NOTE — Telephone Encounter (Signed)
Picked up a call from the patient. He is still having a lot of pain from the waist down and really needs to "get to the bottom of this." He is asking for the results of his MRI L-spine done on Saturday. I told him that I would let Dr. Modesto Charon know so that he could review that. He also wanted to know if his NCV/EMG appointment could be moved up (scheduled 11/04/11). I explained that they are a separate entity with separate scheduling and to call them and ask to be placed on the cancellation list. He states he will do that. He is currently taking Gabapentin 300 mg tabs and is in the weaning up process. He is at 300 mg BID now (day 2 of 7, then goes to TID). Patient reports that it helps but that he hardly slept at all last night. I told him I would send Dr. Modesto Charon a message regarding his situation and we would be in touch. He is ok with this plan. He can be reached at his work number until 3:30 pm. **Dr. Modesto Charon, please advise MRI results. Thank you.

## 2011-10-10 NOTE — Telephone Encounter (Signed)
Called and spoke with the patient. Information given as per Dr. Modesto Charon below. The patient understands to increase the Gabapentin to 300 mg TID. His NCV/EMG is scheduled for 06/03. He told me he has called [facility] and was put on the cancellation list in hopes of being seen sooner. The pain continues in both legs. Asked that he call and let us know if the med change is helpful. He states he will.

## 2011-10-10 NOTE — Telephone Encounter (Signed)
MRI reveals some arthritis that may be pushing on a nerve root causing his left leg pain, but nothing to explain the right leg pain.  We will have to see what the EMG shows.  In the meantime, he can increase immediately to 300 tid of the gabapentin and if that does not bother him increase to 300/300/600 to see if it helps him sleep.

## 2011-10-25 ENCOUNTER — Encounter (HOSPITAL_COMMUNITY): Payer: BC Managed Care – PPO

## 2011-10-29 ENCOUNTER — Other Ambulatory Visit (HOSPITAL_COMMUNITY): Payer: Self-pay | Admitting: *Deleted

## 2011-10-30 ENCOUNTER — Encounter (HOSPITAL_COMMUNITY): Payer: BC Managed Care – PPO

## 2011-11-01 ENCOUNTER — Encounter (HOSPITAL_COMMUNITY): Payer: BC Managed Care – PPO

## 2011-11-01 ENCOUNTER — Encounter (HOSPITAL_COMMUNITY)
Admission: RE | Admit: 2011-11-01 | Discharge: 2011-11-01 | Disposition: A | Discharge status: Home / Self Care | Payer: BC Managed Care – PPO | Source: Ambulatory Visit | Attending: Nephrology | Admitting: Nephrology

## 2011-11-01 DIAGNOSIS — N189 Chronic kidney disease, unspecified: Secondary | ICD-10-CM | POA: Insufficient documentation

## 2011-11-01 DIAGNOSIS — D631 Anemia in chronic kidney disease: Secondary | ICD-10-CM | POA: Insufficient documentation

## 2011-11-01 MED ORDER — SODIUM CHLORIDE 0.9 % IV SOLN
INTRAVENOUS | Status: DC
  Administered 2011-11-01: 09:00:00 via INTRAVENOUS

## 2011-11-01 MED ORDER — FERUMOXYTOL INJECTION 510 MG/17 ML
510.0000 mg | INTRAVENOUS | Status: DC
  Administered 2011-11-01: 510 mg via INTRAVENOUS
  Filled 2011-11-01: qty 17

## 2011-11-06 ENCOUNTER — Encounter (HOSPITAL_COMMUNITY)
Admission: RE | Admit: 2011-11-06 | Discharge: 2011-11-06 | Disposition: A | Discharge status: Home / Self Care | Payer: BC Managed Care – PPO | Source: Ambulatory Visit | Attending: Nephrology | Admitting: Nephrology

## 2011-11-06 DIAGNOSIS — N039 Chronic nephritic syndrome with unspecified morphologic changes: Secondary | ICD-10-CM | POA: Insufficient documentation

## 2011-11-06 DIAGNOSIS — D631 Anemia in chronic kidney disease: Secondary | ICD-10-CM | POA: Insufficient documentation

## 2011-11-06 DIAGNOSIS — N189 Chronic kidney disease, unspecified: Secondary | ICD-10-CM | POA: Insufficient documentation

## 2011-11-06 MED ORDER — FERUMOXYTOL INJECTION 510 MG/17 ML
510.0000 mg | Freq: Once | INTRAVENOUS | Status: AC
  Administered 2011-11-06: 510 mg via INTRAVENOUS
  Filled 2011-11-06: qty 17

## 2011-11-06 MED ORDER — SODIUM CHLORIDE 0.9 % IV SOLN: INTRAVENOUS | Status: DC

## 2011-11-06 MED ORDER — SODIUM CHLORIDE 0.9 % IV SOLN
Freq: Once | INTRAVENOUS | Status: AC
  Administered 2011-11-06: 09:00:00 via INTRAVENOUS

## 2011-11-06 MED ORDER — FERUMOXYTOL INJECTION 510 MG/17 ML: 510.0000 mg | INTRAVENOUS | Status: DC

## 2011-11-25 ENCOUNTER — Ambulatory Visit: Payer: BC Managed Care – PPO | Admitting: Family Medicine

## 2011-12-05 ENCOUNTER — Other Ambulatory Visit: Payer: Self-pay | Admitting: Family Medicine

## 2011-12-10 ENCOUNTER — Ambulatory Visit (INDEPENDENT_AMBULATORY_CARE_PROVIDER_SITE_OTHER): Payer: BC Managed Care – PPO | Admitting: Family Medicine

## 2011-12-10 ENCOUNTER — Encounter: Payer: Self-pay | Admitting: Family Medicine

## 2011-12-10 VITALS — BP 124/70 | Temp 98.4°F | Wt 250.0 lb

## 2011-12-10 DIAGNOSIS — R079 Chest pain, unspecified: Secondary | ICD-10-CM

## 2011-12-10 DIAGNOSIS — K219 Gastro-esophageal reflux disease without esophagitis: Secondary | ICD-10-CM

## 2011-12-10 DIAGNOSIS — R0789 Other chest pain: Secondary | ICD-10-CM | POA: Insufficient documentation

## 2011-12-10 MED ORDER — TRAMADOL HCL 50 MG PO TABS: ORAL_TABLET | ORAL | Status: DC

## 2011-12-10 NOTE — Progress Notes (Signed)
  Subjective:    Patient ID: Todd Spears, male    DOB: 06/02/1946, 66 y.o.   MRN: [id number]  HPI Ameet is a 66 year old married male nonsmoker who comes in today for evaluation of chest pain  He states yesterday about 2 PM he was sitting at work and developed chest pain. He describes the pain as a gradual onset dull constant and he points to the left upper area of his anterior chest wall just below the clavicle. Denies any history of trauma. Yesterday the pain was an 8 and now is down to a 2. He also describes a second type of chest pain which he points to the midepigastric area. He's had a history of reflux esophagitis and takes Nexium 40 mg daily. He only drinks one cup of caffeinated coffee a day no other caffeine he does not drink nor Spears. No difficulty swallowing.  He was able to continue his normal activities throughout the day yesterday. No cardiac or pulmonary symptoms   Review of Systems General and cardiac and pulmonary and GI review of systems otherwise negative    Objective:   Physical Exam  Well-developed well-nourished male in no acute distress cardiopulmonary exam normal abdominal exam normal EKG unchanged      Assessment & Plan:  Atypical chest pain chest wall pain reassured Tylenol tramadol when necessary  Reflux esophagitis with esophageal spasm plan no caffeine strict diet double Nexium for a week return when necessary

## 2011-12-10 NOTE — Patient Instructions (Addendum)
For the chest wall pain take 2 Tylenol 3 times a day and tramadol one half to one tablet 3 times daily as needed  Double the Nexium take one twice daily, strict diet as outlined, nothing to drink for 3 hours before you go to bed, remember to prop her head up and try to sleep on 2 pillows. Do this for a week and your symptoms should abate if not call me

## 2011-12-31 ENCOUNTER — Ambulatory Visit: Payer: BC Managed Care – PPO | Admitting: Neurology

## 2012-01-01 ENCOUNTER — Institutional Professional Consult (permissible substitution): Payer: BC Managed Care – PPO | Admitting: Cardiology

## 2012-01-08 ENCOUNTER — Encounter: Payer: Self-pay | Admitting: Family Medicine

## 2012-01-08 ENCOUNTER — Ambulatory Visit (INDEPENDENT_AMBULATORY_CARE_PROVIDER_SITE_OTHER): Payer: BC Managed Care – PPO | Admitting: Family Medicine

## 2012-01-08 VITALS — BP 120/78 | Temp 98.1°F | Wt 250.0 lb

## 2012-01-08 DIAGNOSIS — M79609 Pain in unspecified limb: Secondary | ICD-10-CM

## 2012-01-08 DIAGNOSIS — M79671 Pain in right foot: Secondary | ICD-10-CM

## 2012-01-08 MED ORDER — HYDROCODONE-ACETAMINOPHEN 5-325 MG PO TABS: 1.0000 | ORAL_TABLET | Freq: Four times a day (QID) | ORAL | Status: AC | PRN

## 2012-01-08 MED ORDER — COLCHICINE 0.6 MG PO TABS: 0.6000 mg | ORAL_TABLET | Freq: Two times a day (BID) | ORAL | Status: DC

## 2012-01-08 NOTE — Patient Instructions (Addendum)
Gout Gout is an inflammatory condition (arthritis) caused by a buildup of uric acid crystals in the joints. Uric acid is a chemical that is normally present in the blood. Under some circumstances, uric acid can form into crystals in your joints. This causes joint redness, soreness, and swelling (inflammation). Repeat attacks are common. Over time, uric acid crystals can form into masses (tophi) near a joint, causing disfigurement. Gout is treatable and often preventable. CAUSES  The disease begins with elevated levels of uric acid in the blood. Uric acid is produced by your body when it breaks down a naturally found substance called purines. This also happens when you eat certain foods such as meats and fish. Causes of an elevated uric acid level include:  Being passed down from parent to child (heredity).   Diseases that cause increased uric acid production (obesity, psoriasis, some cancers).   Excessive alcohol use.   Diet, especially diets rich in meat and seafood.   Medicines, including certain cancer-fighting drugs (chemotherapy), diuretics, and aspirin.   Chronic kidney disease. The kidneys are no longer able to remove uric acid well.   Problems with metabolism.  Conditions strongly associated with gout include:  Obesity.   High blood pressure.   High cholesterol.   Diabetes.  Not everyone with elevated uric acid levels gets gout. It is not understood why some people get gout and others do not. Surgery, joint injury, and eating too much of certain foods are some of the factors that can lead to gout. SYMPTOMS   An attack of gout comes on quickly. It causes intense pain with redness, swelling, and warmth in a joint.   Fever can occur.   Often, only one joint is involved. Certain joints are more commonly involved:   Base of the big toe.   Knee.   Ankle.   Wrist.   Finger.  Without treatment, an attack usually goes away in a few days to weeks. Between attacks, you  usually will not have symptoms, which is different from many other forms of arthritis. DIAGNOSIS  Your caregiver will suspect gout based on your symptoms and exam. Removal of fluid from the joint (arthrocentesis) is done to check for uric acid crystals. Your caregiver will give you a medicine that numbs the area (local anesthetic) and use a needle to remove joint fluid for exam. Gout is confirmed when uric acid crystals are seen in joint fluid, using a special microscope. Sometimes, blood, urine, and X-ray tests are also used. TREATMENT  There are 2 phases to gout treatment: treating the sudden onset (acute) attack and preventing attacks (prophylaxis). Treatment of an Acute Attack  Medicines are used. These include anti-inflammatory medicines or steroid medicines.   An injection of steroid medicine into the affected joint is sometimes necessary.   The painful joint is rested. Movement can worsen the arthritis.   You may use warm or cold treatments on painful joints, depending which works best for you.   Discuss the use of coffee, vitamin C, or cherries with your caregiver. These may be helpful treatment options.  Treatment to Prevent Attacks After the acute attack subsides, your caregiver may advise prophylactic medicine. These medicines either help your kidneys eliminate uric acid from your body or decrease your uric acid production. You may need to stay on these medicines for a very long time. The early phase of treatment with prophylactic medicine can be associated with an increase in acute gout attacks. For this reason, during the first few months   of treatment, your caregiver may also advise you to take medicines usually used for acute gout treatment. Be sure you understand your caregiver's directions. You should also discuss dietary treatment with your caregiver. Certain foods such as meats and fish can increase uric acid levels. Other foods such as dairy can decrease levels. Your caregiver  can give you a list of foods to avoid. HOME CARE INSTRUCTIONS   Do not take aspirin to relieve pain. This raises uric acid levels.   Only take over-the-counter or prescription medicines for pain, discomfort, or fever as directed by your caregiver.   Rest the joint as much as possible. When in bed, keep sheets and blankets off painful areas.   Keep the affected joint raised (elevated).   Use crutches if the painful joint is in your leg.   Drink enough water and fluids to keep your urine clear or pale yellow. This helps your body get rid of uric acid. Do not drink alcoholic beverages. They slow the passage of uric acid.   Follow your caregiver's dietary instructions. Pay careful attention to the amount of protein you eat. Your daily diet should emphasize fruits, vegetables, whole grains, and fat-free or low-fat milk products.   Maintain a healthy body weight.  SEEK MEDICAL CARE IF:   You have an oral temperature above 102 F (38.9 C).   You develop diarrhea, vomiting, or any side effects from medicines.   You do not feel better in 24 hours, or you are getting worse.  SEEK IMMEDIATE MEDICAL CARE IF:   Your joint becomes suddenly more tender and you have:   Chills.   An oral temperature above 102 F (38.9 C), not controlled by medicine.  MAKE SURE YOU:   Understand these instructions.   Will watch your condition.   Will get help right away if you are not doing well or get worse.  Document Released: 05/17/2000 Document Revised: 05/09/2011 Document Reviewed: 08/28/2009 [employer] Patient Information 2012 [employer]. 

## 2012-01-08 NOTE — Progress Notes (Signed)
  Subjective:    Patient ID: Todd Spears, male    DOB: February 04, 1946, 66 y.o.   MRN: [id number]  HPI  Acute visit. Right ankle and foot pain. Started Sunday about 3 days ago. History of reported gout. Noted some warmth but no erythema. No history of injury. Previous intolerance to prednisone with dyspnea and reported rash. History of chronic kidney disease and avoids nonsteroidals. He took allopurinol and colchicine without relief. Pain is moderate. No fever. No chills. No calf pain. No claudication symptoms  Review of Systems  Constitutional: Negative for fever, chills and unexpected weight change.       Objective:   Physical Exam  Constitutional: He appears well-developed and well-nourished.  Cardiovascular: Normal rate and regular rhythm.   Pulmonary/Chest: Effort normal and breath sounds normal. No respiratory distress. He has no wheezes. He has no rales.  Musculoskeletal: He exhibits edema.       Patient has some mild leg edema bilaterally which is symmetric. Good distal foot pulses. Good capillary refill. Right foot is slightly warm to touch compared to left. No cellulitis changes. Mild diffuse tenderness right ankle and lateral aspect of foot.          Assessment & Plan:  Right foot pain and ankle pain. Question acute gout. Avoid nonsteroidals with history of chronic kidney disease. Cautioned against use of allopurinol-for acute gout symptoms. Refill colchicine. Would recommend prednisone but he had intolerance of previously. Limited hydrocodone for severe pain. May need to consider prophylactic such as Uloric if symptoms recur frequently

## 2012-01-28 ENCOUNTER — Other Ambulatory Visit: Payer: Self-pay | Admitting: Family Medicine

## 2012-01-30 ENCOUNTER — Ambulatory Visit (INDEPENDENT_AMBULATORY_CARE_PROVIDER_SITE_OTHER): Payer: BC Managed Care – PPO | Admitting: Family Medicine

## 2012-01-30 ENCOUNTER — Encounter: Payer: Self-pay | Admitting: Family Medicine

## 2012-01-30 VITALS — BP 120/64 | Temp 98.4°F | Wt 248.0 lb

## 2012-01-30 DIAGNOSIS — M79605 Pain in left leg: Secondary | ICD-10-CM | POA: Insufficient documentation

## 2012-01-30 DIAGNOSIS — I10 Essential (primary) hypertension: Secondary | ICD-10-CM

## 2012-01-30 DIAGNOSIS — M79604 Pain in right leg: Secondary | ICD-10-CM

## 2012-01-30 DIAGNOSIS — M79609 Pain in unspecified limb: Secondary | ICD-10-CM

## 2012-01-30 DIAGNOSIS — H6120 Impacted cerumen, unspecified ear: Secondary | ICD-10-CM

## 2012-01-30 MED ORDER — TRAMADOL HCL 50 MG PO TABS: ORAL_TABLET | ORAL | Status: DC

## 2012-01-30 MED ORDER — METAXALONE 800 MG PO TABS: ORAL_TABLET | ORAL | Status: DC

## 2012-01-30 NOTE — Progress Notes (Signed)
  Subjective:    Patient ID: Todd Spears, male    DOB: 1945/06/05, 66 y.o.   MRN: [id number]  HPI Todd Spears is a 66 year old married male nonsmoker who comes in today for evaluation of 2 problems  He has a history of cerumen impactions and indeed he had bilateral cerumen impactions which were removed by suction  He's had a history of leg pain and saw Dr. Denton Spears in April of 2013. At that time his neurologic exam was normal an MRI was normal and he was set up to get I nerve conduction study. In the meantime Dr. Modesto Spears  put him on gabapentin 300 mg 3 times a day however he states that it made his legs worse and also his legs began to swell. He states he called Dr. Edwin Spears office 3 times and never heard anything back he would like to see another physician He brings in his blood pressure readings along with a lab work from the nephrologist. Blood work is normal except for a low-grade anemia renal function is normal except for a slight increase in his creatinine. He was advised to avoid all NSAIDs  Review of Systems    general and neurologic review of systems otherwise negative Objective:   Physical Exam  Well-developed well-nourished male no acute distress he did have bilateral sugar impactions that were removed by suction and irrigation      Assessment & Plan:  Cerumen impactions resolved  Bilateral lower extremity pain referred to Dr. Porfirio Spears d.  neurologist

## 2012-01-30 NOTE — Patient Instructions (Signed)
Skelaxin and tramadol,,,,,,,,,,,,,, one half of each at bedtime for leg pain  Consult with Dr. Ewell Poe. neurologist ASAP

## 2012-03-03 ENCOUNTER — Other Ambulatory Visit: Payer: Self-pay | Admitting: Family Medicine

## 2012-03-16 ENCOUNTER — Ambulatory Visit (INDEPENDENT_AMBULATORY_CARE_PROVIDER_SITE_OTHER): Payer: BC Managed Care – PPO | Admitting: Cardiology

## 2012-03-16 ENCOUNTER — Encounter: Payer: Self-pay | Admitting: Cardiology

## 2012-03-16 VITALS — BP 118/58 | HR 58 | Wt 244.0 lb

## 2012-03-16 DIAGNOSIS — I10 Essential (primary) hypertension: Secondary | ICD-10-CM

## 2012-03-16 DIAGNOSIS — R0789 Other chest pain: Secondary | ICD-10-CM

## 2012-03-16 DIAGNOSIS — R079 Chest pain, unspecified: Secondary | ICD-10-CM

## 2012-03-16 DIAGNOSIS — E785 Hyperlipidemia, unspecified: Secondary | ICD-10-CM

## 2012-03-16 DIAGNOSIS — R011 Cardiac murmur, unspecified: Secondary | ICD-10-CM

## 2012-03-16 NOTE — Assessment & Plan Note (Addendum)
Management per primary care. I discussed diet, weight loss and exercise.

## 2012-03-16 NOTE — Assessment & Plan Note (Signed)
Symptoms atypical. Schedule exercise treadmill for risk stratification. 

## 2012-03-16 NOTE — Assessment & Plan Note (Signed)
Blood pressure controlled. Continue present medications. 

## 2012-03-16 NOTE — Progress Notes (Signed)
HPI: 66-year-old male with no prior cardiac history for evaluation of chest pain. Patient had an episode of chest pain in July. He was in the left upper chest area without radiation or associated symptoms. It lasted one to 2 hours and resolved spontaneously. The pain was not pleuritic or positional. He's had no pain since then. He has dyspnea with more extreme activities but not routine activities. No orthopnea, PND, palpitations, syncope or exertional chest pain. Occasional mild pedal edema. Because of his chest pain cardiology was asked to evaluate.  Current Outpatient Prescriptions  Medication Sig Dispense Refill  . albuterol (PROVENTIL,VENTOLIN) 90 MCG/ACT inhaler Inhale 2 puffs into the lungs every 6 (six) hours as needed.      . amLODipine (NORVASC) 10 MG tablet Take 10 mg by mouth daily.       . cetirizine (ZYRTEC) 10 MG tablet Take 10 mg by mouth daily.        . chlorthalidone (HYGROTON) 25 MG tablet Take 25 mg by mouth. Half tab in the morning       . colchicine (COLCRYS) 0.6 MG tablet Take 1 tablet (0.6 mg total) by mouth 2 (two) times daily.  30 tablet  1  . Cyanocobalamin (VITAMIN B 12 PO) Take 1 tablet by mouth daily.       . doxazosin (CARDURA) 8 MG tablet TAKE 1 TABLET DAILY (NOTE: YOU WILL NEED TO BE SEEN, PLEASE MAKE APPOINTMENT)  90 tablet  1  . halobetasol (ULTRAVATE) 0.05 % cream Apply topically 2 (two) times daily.  50 g  4  . lisinopril (PRINIVIL,ZESTRIL) 40 MG tablet Take 1 tablet (40 mg total) by mouth 2 (two) times daily.  180 tablet  3  . metaxalone (SKELAXIN) 800 MG tablet as needed.      . metoprolol (LOPRESSOR) 50 MG tablet Take 50 mg by mouth daily.        . mometasone (NASONEX) 50 MCG/ACT nasal spray Place 2 sprays into the nose daily.  17 g  3  . NEXIUM 40 MG capsule TAKE 1 CAPSULE DAILY BEFORE BREAKFAST  100 capsule  2  . sildenafil (VIAGRA) 50 MG tablet Take 1 tablet (50 mg total) by mouth daily as needed.  10 tablet  6  . traMADol (ULTRAM) 50 MG tablet One half  to one tablet 3 times daily when necessary for pain  30 tablet  1  . traMADol (ULTRAM) 50 MG tablet 1/2-1 tablet each bedtime  60 tablet  2    Allergies  Allergen Reactions  . Prednisone     REACTION: swelling, SOB    Past Medical History  Diagnosis Date  . Allergic rhinitis   . GERD (gastroesophageal reflux disease)   . Hyperlipidemia   . Hypertension   . Osteoarthritis   . ED (erectile dysfunction)   . Carotid bruit     r  . Renal insufficiency   . Asthma     Past Surgical History  Procedure Date  . Nm pet lymphoma     left side of neck 1999    History   Social History  . Marital Status: Married    Spouse Name: N/A    Number of Children: 1  . Years of Education: N/A   Occupational History  .     Social History Main Topics  . Smoking status: Former Smoker  . Smokeless tobacco: Never Used  . Alcohol Use: No  . Drug Use: No  . Sexually Active: Not on file   Other   Topics Concern  . Not on file   Social History Narrative   RetiredMarriedRegular exercise- no    Family History  Problem Relation Age of Onset  . Hypertension    . Stroke    . Coronary artery disease Father     in his 50s    ROS: no fevers or chills, productive cough, hemoptysis, dysphasia, odynophagia, melena, hematochezia, dysuria, hematuria, rash, seizure activity, orthopnea, PND, claudication. Remaining systems are negative.  Physical Exam:   Blood pressure 118/58, pulse 58, weight 244 lb (110.678 kg).  General:  Well developed/obese in NAD Skin warm/dry Patient not depressed No peripheral clubbing Back-normal HEENT-normal/normal eyelids Neck supple/normal carotid upstroke bilaterally; no bruits; no JVD; no thyromegaly chest - CTA/ normal expansion CV - RRR/normal S1 and S2; no rubs or gallops;  PMI nondisplaced, 2/6 systolic murmur left sternal border. S2 is not diminished. Abdomen -NT/ND, no HSM, no mass, + bowel sounds, no bruit 2+ femoral pulses, no bruits Ext-no edema,  chords, 2+ DP Neuro-grossly nonfocal  ECG sinus bradycardia at a rate of 58. No ST changes.  

## 2012-03-16 NOTE — Patient Instructions (Addendum)
Your physician recommends that you schedule a follow-up appointment in: AS NEEDED PENDING TEST RESULTS  Your physician has requested that you have an echocardiogram. Echocardiography is a painless test that uses sound waves to create images of your heart. It provides your doctor with information about the size and shape of your heart and how well your heart's chambers and valves are working. This procedure takes approximately one hour. There are no restrictions for this procedure.   Your physician has requested that you have an exercise tolerance test. For further information please visit [email or web address] Please also follow instruction sheet, as given.    Exercise Stress Electrocardiography An exercise stress test is a heart test (EKG) which is done while you are moving. You will walk on a treadmill. This test will tell your doctor how your heart does when it is forced to work harder and how much activity you can safely handle. BEFORE THE TEST  Wear shorts or athletic pants.  Wear comfortable tennis shoes.  Women need to wear a bra that allows patches to be put on under it. TEST  An EKG cable will be attached to your waist. This cable is hooked up to patches, which look like round stickers stuck to your chest.  You will be asked to walk on the treadmill.  You will walk until you are too tired or until you are told to stop.  Tell the doctor right away if you have:  Chest pain.  Leg cramps.  Shortness of breath.  Dizziness.  The test may last 30 minutes to 1 hour. The timing depends on your physical condition and the condition of your heart. AFTER THE TEST  You will rest for about 6 minutes. During this time, your heart rhythm and blood pressure will be checked.  The testing equipment will be removed from your body and you can get dressed.  You may go home or back to your hospital room. You may keep doing all your usual activities as told by your doctor. Finding out the  results of your test Ask when your test results will be ready. Make sure you get your test results. Document Released: 11/06/2007 Document Revised: 08/12/2011 Document Reviewed: 11/06/2007 [facility] Patient Information 2013 [place], [place].

## 2012-03-16 NOTE — Assessment & Plan Note (Signed)
Schedule echocardiogram to more fully assess.

## 2012-03-24 ENCOUNTER — Ambulatory Visit (HOSPITAL_COMMUNITY): Payer: BC Managed Care – PPO | Attending: Cardiology

## 2012-03-24 DIAGNOSIS — R011 Cardiac murmur, unspecified: Secondary | ICD-10-CM | POA: Insufficient documentation

## 2012-03-24 DIAGNOSIS — R079 Chest pain, unspecified: Secondary | ICD-10-CM | POA: Insufficient documentation

## 2012-03-24 NOTE — Progress Notes (Signed)
Echocardiogram performed.  

## 2012-03-25 ENCOUNTER — Telehealth: Payer: Self-pay | Admitting: *Deleted

## 2012-03-25 NOTE — Telephone Encounter (Signed)
lmom echo good and will repeat echo in 2 yrs.

## 2012-03-25 NOTE — Telephone Encounter (Signed)
Message copied by Tarri Fuller on Wed Mar 25, 2012 10:34 AM ------      Message from: Lewayne Bunting      Created: Tue Mar 24, 2012  7:06 PM       Fu echo 2 years      Olga Millers

## 2012-03-26 NOTE — Addendum Note (Signed)
Addended by: Marrion Coy L on: 03/26/2012 03:36 PM   Modules accepted: Orders

## 2012-04-01 ENCOUNTER — Telehealth: Payer: Self-pay | Admitting: Family Medicine

## 2012-04-01 DIAGNOSIS — M1990 Unspecified osteoarthritis, unspecified site: Secondary | ICD-10-CM

## 2012-04-01 NOTE — Telephone Encounter (Signed)
Patient called stating that he need a referral a  Rheumatologist,  Dr. Donnetta Hail as they now require a referral from the PCP. Please assist.

## 2012-04-03 ENCOUNTER — Ambulatory Visit
Admission: RE | Admit: 2012-04-03 | Discharge: 2012-04-03 | Disposition: A | Discharge status: Home / Self Care | Payer: BC Managed Care – PPO | Source: Ambulatory Visit | Attending: Nurse Practitioner | Admitting: Nurse Practitioner

## 2012-04-03 ENCOUNTER — Encounter: Payer: Self-pay | Admitting: Nurse Practitioner

## 2012-04-03 ENCOUNTER — Ambulatory Visit (INDEPENDENT_AMBULATORY_CARE_PROVIDER_SITE_OTHER): Payer: BC Managed Care – PPO | Admitting: Nurse Practitioner

## 2012-04-03 ENCOUNTER — Encounter: Payer: BC Managed Care – PPO | Admitting: Nurse Practitioner

## 2012-04-03 VITALS — BP 125/58 | HR 65 | Ht 67.0 in | Wt 242.0 lb

## 2012-04-03 DIAGNOSIS — R011 Cardiac murmur, unspecified: Secondary | ICD-10-CM

## 2012-04-03 DIAGNOSIS — R9439 Abnormal result of other cardiovascular function study: Secondary | ICD-10-CM

## 2012-04-03 DIAGNOSIS — R079 Chest pain, unspecified: Secondary | ICD-10-CM

## 2012-04-03 LAB — CBC WITH DIFFERENTIAL/PLATELET
Basophils Absolute: 0.1 10*3/uL (ref 0.0–0.1)
Basophils Relative: 0.8 % (ref 0.0–3.0)
Eosinophils Absolute: 0.3 10*3/uL (ref 0.0–0.7)
Eosinophils Relative: 4 % (ref 0.0–5.0)
HCT: 33.2 % — ABNORMAL LOW (ref 39.0–52.0)
Hemoglobin: 10.9 g/dL — ABNORMAL LOW (ref 13.0–17.0)
Lymphocytes Relative: 36.7 % (ref 12.0–46.0)
Lymphs Abs: 2.8 10*3/uL (ref 0.7–4.0)
MCHC: 32.8 g/dL (ref 30.0–36.0)
MCV: 91.7 fl (ref 78.0–100.0)
Monocytes Absolute: 0.5 10*3/uL (ref 0.1–1.0)
Monocytes Relative: 6.3 % (ref 3.0–12.0)
Neutro Abs: 3.9 10*3/uL (ref 1.4–7.7)
Neutrophils Relative %: 52.2 % (ref 43.0–77.0)
Platelets: 168 10*3/uL (ref 150.0–400.0)
RBC: 3.62 Mil/uL — ABNORMAL LOW (ref 4.22–5.81)
RDW: 12.9 % (ref 11.5–14.6)
WBC: 7.6 10*3/uL (ref 4.5–10.5)

## 2012-04-03 LAB — BASIC METABOLIC PANEL
BUN: 33 mg/dL — ABNORMAL HIGH (ref 6–23)
CO2: 20 mEq/L (ref 19–32)
Calcium: 8.8 mg/dL (ref 8.4–10.5)
Chloride: 107 mEq/L (ref 96–112)
Creatinine, Ser: 2 mg/dL — ABNORMAL HIGH (ref 0.4–1.5)
GFR: 36.24 mL/min — ABNORMAL LOW (ref >60.00)
Glucose, Bld: 116 mg/dL — ABNORMAL HIGH (ref 70–99)
Potassium: 4 mEq/L (ref 3.5–5.1)
Sodium: 136 mEq/L (ref 135–145)

## 2012-04-03 LAB — PROTIME-INR
INR: 1 ratio (ref 0.8–1.0)
Prothrombin Time: 10.6 s (ref 10.2–12.4)

## 2012-04-03 NOTE — Patient Instructions (Addendum)
We need to arrange for a heart catheterization next week with Dr. Swaziland.  We need to check labs today  When you leave here today I want you to go to [facility] [address] You can walk in.  Start a baby aspirin each day.  If you have recurrent chest pain, go to the ER  You are scheduled for an outpatient cardiac catheterization on Monday, November 4th with Dr. Swaziland at 8:30am or associates.  Go the [facility] Center at [facility] on Monday, November 4th at 7:30am.  Call the Heart & Vascular Center at [phone] if you are unable to make your appointment.  The code to get into the parking garage under the building is 8000. Then go to the first floor.  You must have someone available to drive you home. Someone needs to be with you for the first 24 hours after you arrive home. Please wear clothes that are easy to get on and off.  Do not eat or drink after midnight on Sunday. You may have water only with your medications on the morning of your procedure.   May sure you take your aspirin on the day of your procedure.      Directions to the Outpatient Cardiac Cath Lab at [facility]:  Please Note:  Park in [place] under the building not the parking deck.  From [employer]: Turn onto [address] Left onto [place] (1st stoplight) Right at the brick entrance to the hospital ([address]) [place] to the right and you will see a blue sign "Heart [facility] V[facility] Center" Parking garage is a sharp right, to get through the gate put in the code 8000. Once you park, take the elevator to the first floor.  Please do not arrive before 6:30 am.  The building will be dark before that time.  From [employer]: Turn onto [address] Turn left into the brick entrance to the hospital (Main circle [address]) [place] to the right and you will see a blue sign "[facility]" Parking garage is a sharp right, to get through the gate put in the code  8000. Once you park, take the elevator to the first floor.  Please do not arrive before 6:30 am.  The building will be dark before that time.

## 2012-04-03 NOTE — Progress Notes (Signed)
Exercise Treadmill Test  Pre-Exercise Testing Evaluation Rhythm: normal sinus  Rate: 65   PR:  .17 QRS:  .08  QT:  .36 QTc: .37           Test  Exercise Tolerance Test Ordering MD: Olga Millers, MD  Interpreting MD: Norma Fredrickson, NP  Unique Test No: 1   Treadmill:  2  Indication for ETT: chest pain - rule out ischemia  Contraindication to ETT: No   Stress Modality: exercise - treadmill  Cardiac Imaging Performed: non   Protocol: standard Bruce - maximal  Max BP:  159/53  Max MPHR (bpm):  154 85% MPR (bpm):  131  MPHR obtained (bpm):  150 % MPHR obtained:  97  Reached 85% MPHR (min:sec):  2:05 Total Exercise Time (min-sec):  4:01  Workload in METS:  5.8 Borg Scale: 17  Reason ETT Terminated:  Fatigue    ST Segment Analysis At Rest: normal ST segments - no evidence of significant ST depression With Exercise: significant ischemic ST depression  Other Information Arrhythmia:  No Angina during ETT:  absent (0) Quality of ETT:  diagnostic  ETT Interpretation:  abnormal - evidence of ST depression consistent with ischemia  Comments: Todd Spears presents today for GXT testing. He has had chest pain back in July. Felt to be "indigestion". Seen earlier this month and was referred for testing. He has a strong family history of CAD, HTN, obesity and is male. He does not smoke. He has not had any further chest pain. Did have an echo recently showing mild AS with normal LV function.   Today, he exercised on the standard Bruce protocol. He exercised for a total of 4 minutes. He has poor exercise tolerance. Adequate blood pressure. Clinically negative for chest pain. Test was stopped due to fatigue and dyspnea. EKG was positive for ischemia. He has ST depression laterally. Tracings reviewed with Dr. Excell Seltzer in the office this afternoon.   Recommendations: In light of the EKG changes cardiac catheterization has been recommended. The procedure, risks and benefits have been reviewed and he is  willing to proceed. The procedure is set up for Monday November 4th in the JV lab. Labs and CXR will be obtained today. Aspirin 81 mg has been started. He remains without chest pain. If he has recurrent chest pain, he is to go to the ER.   Patient is agreeable to this plan and will call if any problems develop in the interim.

## 2012-04-06 ENCOUNTER — Encounter (HOSPITAL_BASED_OUTPATIENT_CLINIC_OR_DEPARTMENT_OTHER)
Admission: RE | Disposition: A | Discharge status: Hospice | Payer: Self-pay | Source: Ambulatory Visit | Attending: Cardiovascular Disease

## 2012-04-06 ENCOUNTER — Encounter (HOSPITAL_COMMUNITY): Payer: Self-pay | Admitting: *Deleted

## 2012-04-06 ENCOUNTER — Ambulatory Visit (HOSPITAL_COMMUNITY)
Admission: AD | Admit: 2012-04-06 | Discharge: 2012-04-07 | Disposition: A | Discharge status: Home / Self Care | Payer: BC Managed Care – PPO | Source: Ambulatory Visit | Attending: Cardiovascular Disease | Admitting: Cardiovascular Disease

## 2012-04-06 ENCOUNTER — Other Ambulatory Visit: Payer: Self-pay | Admitting: Cardiovascular Disease

## 2012-04-06 ENCOUNTER — Inpatient Hospital Stay (HOSPITAL_BASED_OUTPATIENT_CLINIC_OR_DEPARTMENT_OTHER)
Admission: RE | Admit: 2012-04-06 | Discharge: 2012-04-06 | Disposition: A | Discharge status: Hospice | Payer: BC Managed Care – PPO | Source: Ambulatory Visit | Attending: Cardiovascular Disease | Admitting: Cardiovascular Disease

## 2012-04-06 ENCOUNTER — Encounter (HOSPITAL_COMMUNITY)
Admission: AD | Disposition: A | Discharge status: Home / Self Care | Payer: Self-pay | Source: Ambulatory Visit | Attending: Cardiovascular Disease

## 2012-04-06 DIAGNOSIS — J309 Allergic rhinitis, unspecified: Secondary | ICD-10-CM

## 2012-04-06 DIAGNOSIS — K219 Gastro-esophageal reflux disease without esophagitis: Secondary | ICD-10-CM | POA: Insufficient documentation

## 2012-04-06 DIAGNOSIS — I10 Essential (primary) hypertension: Secondary | ICD-10-CM | POA: Insufficient documentation

## 2012-04-06 DIAGNOSIS — Z23 Encounter for immunization: Secondary | ICD-10-CM | POA: Insufficient documentation

## 2012-04-06 DIAGNOSIS — I208 Other forms of angina pectoris: Secondary | ICD-10-CM

## 2012-04-06 DIAGNOSIS — J45909 Unspecified asthma, uncomplicated: Secondary | ICD-10-CM | POA: Insufficient documentation

## 2012-04-06 DIAGNOSIS — M1990 Unspecified osteoarthritis, unspecified site: Secondary | ICD-10-CM | POA: Insufficient documentation

## 2012-04-06 DIAGNOSIS — I2510 Atherosclerotic heart disease of native coronary artery without angina pectoris: Secondary | ICD-10-CM | POA: Insufficient documentation

## 2012-04-06 DIAGNOSIS — R0609 Other forms of dyspnea: Secondary | ICD-10-CM | POA: Insufficient documentation

## 2012-04-06 DIAGNOSIS — N529 Male erectile dysfunction, unspecified: Secondary | ICD-10-CM

## 2012-04-06 DIAGNOSIS — R0989 Other specified symptoms and signs involving the circulatory and respiratory systems: Secondary | ICD-10-CM | POA: Insufficient documentation

## 2012-04-06 DIAGNOSIS — E785 Hyperlipidemia, unspecified: Secondary | ICD-10-CM | POA: Insufficient documentation

## 2012-04-06 DIAGNOSIS — M79605 Pain in left leg: Secondary | ICD-10-CM

## 2012-04-06 DIAGNOSIS — D631 Anemia in chronic kidney disease: Secondary | ICD-10-CM

## 2012-04-06 HISTORY — PX: PERCUTANEOUS CORONARY STENT INTERVENTION (PCI-S): SHX5485

## 2012-04-06 HISTORY — DX: Nonrheumatic aortic (valve) stenosis: I35.0

## 2012-04-06 HISTORY — DX: Atherosclerotic heart disease of native coronary artery without angina pectoris: I25.10

## 2012-04-06 HISTORY — DX: Anemia, unspecified: D64.9

## 2012-04-06 LAB — POCT I-STAT, CHEM 8
BUN: 33 mg/dL — ABNORMAL HIGH (ref 6–23)
Creatinine, Ser: 1.4 mg/dL — ABNORMAL HIGH (ref 0.50–1.35)
Potassium: 3.8 mEq/L (ref 3.5–5.1)
Sodium: 142 mEq/L (ref 135–145)

## 2012-04-06 LAB — POCT ACTIVATED CLOTTING TIME: Activated Clotting Time: 319 seconds

## 2012-04-06 SURGERY — PERCUTANEOUS CORONARY STENT INTERVENTION (PCI-S)
Anesthesia: LOCAL

## 2012-04-06 SURGERY — JV LEFT HEART CATHETERIZATION WITH CORONARY ANGIOGRAM
Anesthesia: Moderate Sedation

## 2012-04-06 MED ORDER — LISINOPRIL 40 MG PO TABS
40.0000 mg | ORAL_TABLET | Freq: Two times a day (BID) | ORAL | Status: DC
  Administered 2012-04-07: 11:00:00 40 mg via ORAL
  Filled 2012-04-06 (×3): qty 1

## 2012-04-06 MED ORDER — ASPIRIN 81 MG PO CHEW
81.0000 mg | CHEWABLE_TABLET | Freq: Every day | ORAL | Status: DC
  Administered 2012-04-07: 81 mg via ORAL
  Filled 2012-04-06 (×2): qty 1

## 2012-04-06 MED ORDER — PRASUGREL HCL 10 MG PO TABS
10.0000 mg | ORAL_TABLET | Freq: Every day | ORAL | Status: DC
  Administered 2012-04-07: 11:00:00 10 mg via ORAL
  Filled 2012-04-06: qty 1

## 2012-04-06 MED ORDER — BIVALIRUDIN 250 MG IV SOLR
INTRAVENOUS | Status: AC
  Filled 2012-04-06: qty 250

## 2012-04-06 MED ORDER — CHLORTHALIDONE 25 MG PO TABS
25.0000 mg | ORAL_TABLET | Freq: Every day | ORAL | Status: DC
  Filled 2012-04-06: qty 1

## 2012-04-06 MED ORDER — ALBUTEROL SULFATE HFA 108 (90 BASE) MCG/ACT IN AERS
2.0000 | INHALATION_SPRAY | Freq: Four times a day (QID) | RESPIRATORY_TRACT | Status: DC | PRN
  Filled 2012-04-06: qty 6.7

## 2012-04-06 MED ORDER — SODIUM CHLORIDE 0.9 % IV SOLN
250.0000 mL | INTRAVENOUS | Status: DC | PRN
Start: 2012-04-06 — End: 2012-04-07

## 2012-04-06 MED ORDER — AMLODIPINE BESYLATE 10 MG PO TABS
10.0000 mg | ORAL_TABLET | Freq: Every day | ORAL | Status: DC
  Filled 2012-04-06 (×2): qty 1

## 2012-04-06 MED ORDER — ALBUTEROL 90 MCG/ACT IN AERS: 2.0000 | INHALATION_SPRAY | Freq: Four times a day (QID) | RESPIRATORY_TRACT | Status: DC | PRN

## 2012-04-06 MED ORDER — LORATADINE 10 MG PO TABS
10.0000 mg | ORAL_TABLET | Freq: Every day | ORAL | Status: DC
  Administered 2012-04-07: 11:00:00 10 mg via ORAL
  Filled 2012-04-06 (×2): qty 1

## 2012-04-06 MED ORDER — ONDANSETRON HCL 4 MG/2ML IJ SOLN: 4.0000 mg | Freq: Four times a day (QID) | INTRAMUSCULAR | Status: DC | PRN

## 2012-04-06 MED ORDER — METOPROLOL TARTRATE 50 MG PO TABS: 50.0000 mg | ORAL_TABLET | Freq: Every day | ORAL | Status: DC

## 2012-04-06 MED ORDER — PANTOPRAZOLE SODIUM 40 MG PO TBEC
80.0000 mg | DELAYED_RELEASE_TABLET | Freq: Every day | ORAL | Status: DC
  Administered 2012-04-07: 12:00:00 80 mg via ORAL
  Filled 2012-04-06: qty 2

## 2012-04-06 MED ORDER — METOPROLOL TARTRATE 50 MG PO TABS
50.0000 mg | ORAL_TABLET | Freq: Every day | ORAL | Status: DC
  Filled 2012-04-06: qty 1

## 2012-04-06 MED ORDER — NITROGLYCERIN 0.2 MG/ML ON CALL CATH LAB
INTRAVENOUS | Status: AC
  Filled 2012-04-06: qty 1

## 2012-04-06 MED ORDER — DOXAZOSIN MESYLATE 8 MG PO TABS
8.0000 mg | ORAL_TABLET | Freq: Every day | ORAL | Status: DC
  Filled 2012-04-06 (×2): qty 1

## 2012-04-06 MED ORDER — HEPARIN (PORCINE) IN NACL 100-0.45 UNIT/ML-% IJ SOLN
1000.0000 [IU]/h | INTRAMUSCULAR | Status: DC
Start: 2012-04-06 — End: 2012-04-06
  Administered 2012-04-06: 1000 [IU]/h via INTRAVENOUS
  Filled 2012-04-06: qty 250

## 2012-04-06 MED ORDER — PRASUGREL HCL 10 MG PO TABS
60.0000 mg | ORAL_TABLET | Freq: Once | ORAL | Status: AC
  Administered 2012-04-06: 60 mg via ORAL
  Filled 2012-04-06: qty 6

## 2012-04-06 MED ORDER — SODIUM CHLORIDE 0.9 % IV SOLN
INTRAVENOUS | Status: AC
  Administered 2012-04-07: 02:00:00 via INTRAVENOUS

## 2012-04-06 MED ORDER — FENTANYL CITRATE 0.05 MG/ML IJ SOLN
INTRAMUSCULAR | Status: AC
  Filled 2012-04-06: qty 2

## 2012-04-06 MED ORDER — SODIUM CHLORIDE 0.9 % IJ SOLN: 3.0000 mL | INTRAMUSCULAR | Status: DC | PRN

## 2012-04-06 MED ORDER — SODIUM CHLORIDE 0.9 % IJ SOLN: 3.0000 mL | Freq: Two times a day (BID) | INTRAMUSCULAR | Status: DC

## 2012-04-06 MED ORDER — PRASUGREL HCL 10 MG PO TABS: 10.0000 mg | ORAL_TABLET | Freq: Every day | ORAL | Status: DC

## 2012-04-06 MED ORDER — OXYCODONE-ACETAMINOPHEN 5-325 MG PO TABS: 1.0000 | ORAL_TABLET | ORAL | Status: DC | PRN

## 2012-04-06 MED ORDER — HEPARIN (PORCINE) IN NACL 100-0.45 UNIT/ML-% IJ SOLN: 10.0000 [IU]/kg/h | INTRAMUSCULAR | Status: DC

## 2012-04-06 MED ORDER — DIAZEPAM 5 MG PO TABS: 5.0000 mg | ORAL_TABLET | ORAL | Status: DC | PRN

## 2012-04-06 MED ORDER — ASPIRIN 81 MG PO CHEW: 81.0000 mg | CHEWABLE_TABLET | Freq: Every day | ORAL | Status: DC

## 2012-04-06 MED ORDER — HEPARIN (PORCINE) IN NACL 2-0.9 UNIT/ML-% IJ SOLN
INTRAMUSCULAR | Status: AC
  Filled 2012-04-06: qty 1000

## 2012-04-06 MED ORDER — METOPROLOL SUCCINATE ER 50 MG PO TB24
50.0000 mg | ORAL_TABLET | Freq: Every day | ORAL | Status: DC
  Administered 2012-04-07: 11:00:00 50 mg via ORAL
  Filled 2012-04-06 (×2): qty 1

## 2012-04-06 MED ORDER — ACETAMINOPHEN 325 MG PO TABS: 650.0000 mg | ORAL_TABLET | ORAL | Status: DC | PRN

## 2012-04-06 MED ORDER — INFLUENZA VIRUS VACC SPLIT PF IM SUSP
0.5000 mL | INTRAMUSCULAR | Status: AC
  Administered 2012-04-07: 11:00:00 0.5 mL via INTRAMUSCULAR
  Filled 2012-04-06: qty 0.5

## 2012-04-06 MED ORDER — MIDAZOLAM HCL 2 MG/2ML IJ SOLN
INTRAMUSCULAR | Status: AC
  Filled 2012-04-06: qty 2

## 2012-04-06 MED ORDER — TRAMADOL HCL 50 MG PO TABS: 50.0000 mg | ORAL_TABLET | Freq: Two times a day (BID) | ORAL | Status: DC | PRN

## 2012-04-06 MED ORDER — ASPIRIN EC 325 MG PO TBEC
325.0000 mg | DELAYED_RELEASE_TABLET | Freq: Every day | ORAL | Status: DC
Start: 2012-04-06 — End: 2012-04-06

## 2012-04-06 MED ORDER — COLCHICINE 0.6 MG PO TABS
0.6000 mg | ORAL_TABLET | Freq: Two times a day (BID) | ORAL | Status: DC
  Filled 2012-04-06 (×3): qty 1

## 2012-04-06 MED ORDER — SODIUM CHLORIDE 0.45 % IV SOLN: INTRAVENOUS | Status: DC

## 2012-04-06 MED ORDER — ASPIRIN EC 325 MG PO TBEC: 325.0000 mg | DELAYED_RELEASE_TABLET | Freq: Every day | ORAL | Status: DC

## 2012-04-06 MED ORDER — LIDOCAINE HCL (PF) 1 % IJ SOLN
INTRAMUSCULAR | Status: AC
  Filled 2012-04-06: qty 30

## 2012-04-06 NOTE — CV Procedure (Signed)
      Catheterization   Indication:  Angina abnormal ETT  Procedure: After informed consent and clinical "time out" the right groin was prepped and draped in a sterile fashion.  A 5Fr sheath was placed in the right femoral artery using seldinger technique and local lidocaine.  Standard JL4, JR4 and angled pigtail catheters were used to engage the coronary arteries.  Coronary arteries were visualized in orthogonal views using caudal and cranial angulation.  RAO ventriculography was done using 25 * cc of contrast.    Medications:   Versed: 2 mg's  Fentanyl: 25 ug's  Coronary Arteries: Right dominant with no anomalies  LM: Normal  LAD: 90% mid vessel stenosis    IM: 30-40% calcific disease  D1: 30% multiple discrete lesions    Circumflex: Normal   OM1: normal  OM2: normal  RCA: 95% mid vessel stenosis   PDA: normal  PLA: normal  Ventriculography: EF: 55 %, no RWMA;s  Hemodynamics:  Aortic Pressure:  134 53 mmHg  LV Pressure:  131 12  mmHg  Impression:  Discussed with Dr Swaziland PCI/Stent to LAD and RCA.  Will load with Plavix in JV lab  Charlton Haws 04/06/2012 11:23 AM

## 2012-04-06 NOTE — Progress Notes (Signed)
Bedrest completed, rt groin level 0.  Pt denies complaints.  Orthostatic VS begun after lying flat for 5 minutes, BP 134/47 w/ HR 64.  Sat on side of bed x 5 minutes, bp 98/38 and tele showed junctional rhythm w/ HR 47-55, pt voiced sudden need to move bowels.  Pt told need to stay in bed b/c of significant drop of BP but pt insisted on getting up to bsc where he quickly became nauseated, pale and diaphoretic.  Passed soft dark BM.  Did not lose consciousness but affect quickly became very distant. Hoisted back to bed w/ RN x 2. bp 122/65, 1st degree HB 60's.  Given 200 cc IVF bolus.  Pt feeling much better. Hurman Horn PA informed, order received to hold all hypertensives tonight and keep in bed.  Wife at bedside, call light in reach.

## 2012-04-06 NOTE — Progress Notes (Signed)
Transported to main cath via stretcher and monitor.

## 2012-04-06 NOTE — CV Procedure (Signed)
   CARDIAC CATH NOTE  Name: Todd Spears MRN: [id number] DOB: [birth year]  Procedure: PTCA and stenting of the mid LAD and mid RCA  Indication: 66 year-old white male with recent onset of chest pain. Exercise stress test was abnormal showing evidence of ischemia. Diagnostic cardiac catheterization demonstrated a focal 95% stenosis in the mid LAD and a focal 90% stenosis in the mid RCA. PCI was recommended.  Procedural Details: The right groin was prepped, draped, and anesthetized with 1% lidocaine. A 6 Fr sheath was exchanged for the diagnostic sheath in the right femoral artery.  Weight-based bivalirudin was given for anticoagulation. Once a therapeutic ACT was achieved, a 6 [place] XB LAD 3.5 guide catheter was inserted.  A pro-water coronary guidewire was used to cross the lesion in the LAD.  The lesion was predilated with a 2.5 mm balloon.  The lesion was then stented with a 3.0 x 12 mm Promus stent.  The stent was postdilated with a 3.0 mm noncompliant balloon.  Following PCI, there was 0% residual stenosis and TIMI-3 flow.  We then approached the lesion in the right coronary. A 6 French F. L4 guide was inserted. A pro-water guidewire was used to cross the lesion. The lesion was predilated with a 2.5 mm balloon. The lesion was then stented with a 3.0 x 12 mm Promus stent. The stent was postdilated with a 3.25 mm noncompliant balloon. Following PCI, there was 0% residual stenosis and TIMI grade 3 flow. Final angiography confirmed an excellent result. The patient tolerated the procedure well. There were no immediate procedural complications. Femoral hemostasis was achieved with a Mynx closure device. The patient was transferred to the post catheterization recovery area for further monitoring.  Lesion Data: Vessel: LAD, mid Percent stenosis (pre): 95% TIMI-flow (pre):  3 Stent:  3.0 x 12 mm Promus Percent stenosis (post): 0% TIMI-flow (post): 3  Vessel #2: Mid RCA Percent stenosis (pre-):  90% TIMI flow (pre-) 3 Stent: 3.0 x 12 mm Promus Percent stenoses (post) 0% TIMI flow (post) 3  Conclusions:  Successful intracoronary stenting of the mid LAD and mid RCA with drug-eluting stents.  Recommendations: Dual antiplatelet therapy for one year.  [facility] 04/06/2012, 5:42 PM

## 2012-04-06 NOTE — H&P (Signed)
HPI: 66 year old male with no prior cardiac history for evaluation of chest pain with Dr Jens Som in office 10/14  Patient had an episode of chest pain in July. He was in the left upper chest area without radiation or associated symptoms. It lasted one to 2 hours and resolved spontaneously. The pain was not pleuritic or positional. He's had no pain since then. He has dyspnea with more extreme activities but not routine activities. No orthopnea, PND, palpitations, syncope or exertional chest pain. Occasional mild pedal edema. Because of his chest pain cardiology was asked to evaluate. Set up for ETT which was positive and subsequently set up for JV cath.    Current Outpatient Prescriptions   Medication  Sig  Dispense  Refill   .  albuterol (PROVENTIL,VENTOLIN) 90 MCG/ACT inhaler  Inhale 2 puffs into the lungs every 6 (six) hours as needed.         Marland Kitchen  amLODipine (NORVASC) 10 MG tablet  Take 10 mg by mouth daily.          .  cetirizine (ZYRTEC) 10 MG tablet  Take 10 mg by mouth daily.           .  chlorthalidone (HYGROTON) 25 MG tablet  Take 25 mg by mouth. Half tab in the morning          .  colchicine (COLCRYS) 0.6 MG tablet  Take 1 tablet (0.6 mg total) by mouth 2 (two) times daily.   30 tablet   1   .  Cyanocobalamin (VITAMIN B 12 PO)  Take 1 tablet by mouth daily.          Marland Kitchen  doxazosin (CARDURA) 8 MG tablet  TAKE 1 TABLET DAILY (NOTE: YOU WILL NEED TO BE SEEN, PLEASE MAKE APPOINTMENT)   90 tablet   1   .  halobetasol (ULTRAVATE) 0.05 % cream  Apply topically 2 (two) times daily.   50 g   4   .  lisinopril (PRINIVIL,ZESTRIL) 40 MG tablet  Take 1 tablet (40 mg total) by mouth 2 (two) times daily.   180 tablet   3   .  metaxalone (SKELAXIN) 800 MG tablet  as needed.         .  metoprolol (LOPRESSOR) 50 MG tablet  Take 50 mg by mouth daily.           .  mometasone (NASONEX) 50 MCG/ACT nasal spray  Place 2 sprays into the nose daily.   17 g   3   .  NEXIUM 40 MG capsule  TAKE 1 CAPSULE DAILY  BEFORE BREAKFAST   100 capsule   2   .  sildenafil (VIAGRA) 50 MG tablet  Take 1 tablet (50 mg total) by mouth daily as needed.   10 tablet   6   .  traMADol (ULTRAM) 50 MG tablet  One half to one tablet 3 times daily when necessary for pain   30 tablet   1   .  traMADol (ULTRAM) 50 MG tablet  1/2-1 tablet each bedtime   60 tablet   2         Allergies   Allergen  Reactions   .  Prednisone         REACTION: swelling, SOB        Past Medical History   Diagnosis  Date   .  Allergic rhinitis     .  GERD (gastroesophageal reflux disease)     .  Hyperlipidemia     .  Hypertension     .  Osteoarthritis     .  ED (erectile dysfunction)     .  Carotid bruit         r   .  Renal insufficiency     .  Asthma           Past Surgical History   Procedure  Date   .  Nm pet lymphoma         left side of neck 1999         History       Social History   .  Marital Status:  Married       Spouse Name:  N/A       Number of Children:  1   .  Years of Education:  N/A       Occupational History   .           Social History Main Topics   .  Smoking status:  Former Games developer   .  Smokeless tobacco:  Never Used   .  Alcohol Use:  No   .  Drug Use:  No   .  Sexually Active:  Not on file       Other Topics  Concern   .  Not on file       Social History Narrative     RetiredMarriedRegular exercise- no         Family History   Problem  Relation  Age of Onset   .  Hypertension       .  Stroke       .  Coronary artery disease  Father         in his 74s        ROS: no fevers or chills, productive cough, hemoptysis, dysphasia, odynophagia, melena, hematochezia, dysuria, hematuria, rash, seizure activity, orthopnea, PND, claudication. Remaining systems are negative.   Physical Exam:    Blood pressure 118/58, pulse 58, weight 244 lb (110.678 kg).   General:  Well developed/obese in NAD Skin warm/dry Patient not depressed No peripheral  clubbing Back-normal HEENT-normal/normal eyelids Neck supple/normal carotid upstroke bilaterally; no bruits; no JVD; no thyromegaly chest - CTA/ normal expansion CV - RRR/normal S1 and S2; no rubs or gallops;  PMI nondisplaced, 2/6 systolic murmur left sternal border. S2 is not diminished. Abdomen -NT/ND, no HSM, no mass, + bowel sounds, no bruit 2+ femoral pulses, no bruits Ext-no edema, chords, 2+ DP Neuro-grossly nonfocal   ECG sinus bradycardia at a rate of 58. No ST changes.   Cath:  Severe 2VD with 90% stenosis in RCA and LAD.  See separate cath note  Impression/Plan:  Patient will be loaded with Effient ( he is on chronic H2 blocker)  Sheath sewn in and discussed case with Dr Swaziland Will try to proceed with 2 V PCI/Stent today Heparin for sheath.  ASA given  Tolerated diagnositic cath well

## 2012-04-06 NOTE — Interval H&P Note (Signed)
History and Physical Interval Note:  04/06/2012 11:04 AM  Todd Spears  has presented today for surgery, with the diagnosis of abn stress test  The various methods of treatment have been discussed with the patient and family. After consideration of risks, benefits and other options for treatment, the patient has consented to  Procedure(s) (LRB) with comments: JV LEFT HEART CATHETERIZATION WITH CORONARY ANGIOGRAM (N/A) as a surgical intervention .  The patient's history has been reviewed, patient examined, no change in status, stable for surgery.  I have reviewed the patient's chart and labs.  Questions were answered to the patient's satisfaction.     Charlton Haws

## 2012-04-06 NOTE — H&P (View-Only) (Signed)
HPI: 66 year old male with no prior cardiac history for evaluation of chest pain. Patient had an episode of chest pain in July. He was in the left upper chest area without radiation or associated symptoms. It lasted one to 2 hours and resolved spontaneously. The pain was not pleuritic or positional. He's had no pain since then. He has dyspnea with more extreme activities but not routine activities. No orthopnea, PND, palpitations, syncope or exertional chest pain. Occasional mild pedal edema. Because of his chest pain cardiology was asked to evaluate.  Current Outpatient Prescriptions  Medication Sig Dispense Refill  . albuterol (PROVENTIL,VENTOLIN) 90 MCG/ACT inhaler Inhale 2 puffs into the lungs every 6 (six) hours as needed.      Marland Kitchen amLODipine (NORVASC) 10 MG tablet Take 10 mg by mouth daily.       . cetirizine (ZYRTEC) 10 MG tablet Take 10 mg by mouth daily.        . chlorthalidone (HYGROTON) 25 MG tablet Take 25 mg by mouth. Half tab in the morning       . colchicine (COLCRYS) 0.6 MG tablet Take 1 tablet (0.6 mg total) by mouth 2 (two) times daily.  30 tablet  1  . Cyanocobalamin (VITAMIN B 12 PO) Take 1 tablet by mouth daily.       Marland Kitchen doxazosin (CARDURA) 8 MG tablet TAKE 1 TABLET DAILY (NOTE: YOU WILL NEED TO BE SEEN, PLEASE MAKE APPOINTMENT)  90 tablet  1  . halobetasol (ULTRAVATE) 0.05 % cream Apply topically 2 (two) times daily.  50 g  4  . lisinopril (PRINIVIL,ZESTRIL) 40 MG tablet Take 1 tablet (40 mg total) by mouth 2 (two) times daily.  180 tablet  3  . metaxalone (SKELAXIN) 800 MG tablet as needed.      . metoprolol (LOPRESSOR) 50 MG tablet Take 50 mg by mouth daily.        . mometasone (NASONEX) 50 MCG/ACT nasal spray Place 2 sprays into the nose daily.  17 g  3  . NEXIUM 40 MG capsule TAKE 1 CAPSULE DAILY BEFORE BREAKFAST  100 capsule  2  . sildenafil (VIAGRA) 50 MG tablet Take 1 tablet (50 mg total) by mouth daily as needed.  10 tablet  6  . traMADol (ULTRAM) 50 MG tablet One half  to one tablet 3 times daily when necessary for pain  30 tablet  1  . traMADol (ULTRAM) 50 MG tablet 1/2-1 tablet each bedtime  60 tablet  2    Allergies  Allergen Reactions  . Prednisone     REACTION: swelling, SOB    Past Medical History  Diagnosis Date  . Allergic rhinitis   . GERD (gastroesophageal reflux disease)   . Hyperlipidemia   . Hypertension   . Osteoarthritis   . ED (erectile dysfunction)   . Carotid bruit     r  . Renal insufficiency   . Asthma     Past Surgical History  Procedure Date  . Nm pet lymphoma     left side of neck 1999    History   Social History  . Marital Status: Married    Spouse Name: N/A    Number of Children: 1  . Years of Education: N/A   Occupational History  .     Social History Main Topics  . Smoking status: Former Games developer  . Smokeless tobacco: Never Used  . Alcohol Use: No  . Drug Use: No  . Sexually Active: Not on file   Other  Topics Concern  . Not on file   Social History Narrative   RetiredMarriedRegular exercise- no    Family History  Problem Relation Age of Onset  . Hypertension    . Stroke    . Coronary artery disease Father     in his 65s    ROS: no fevers or chills, productive cough, hemoptysis, dysphasia, odynophagia, melena, hematochezia, dysuria, hematuria, rash, seizure activity, orthopnea, PND, claudication. Remaining systems are negative.  Physical Exam:   Blood pressure 118/58, pulse 58, weight 244 lb (110.678 kg).  General:  Well developed/obese in NAD Skin warm/dry Patient not depressed No peripheral clubbing Back-normal HEENT-normal/normal eyelids Neck supple/normal carotid upstroke bilaterally; no bruits; no JVD; no thyromegaly chest - CTA/ normal expansion CV - RRR/normal S1 and S2; no rubs or gallops;  PMI nondisplaced, 2/6 systolic murmur left sternal border. S2 is not diminished. Abdomen -NT/ND, no HSM, no mass, + bowel sounds, no bruit 2+ femoral pulses, no bruits Ext-no edema,  chords, 2+ DP Neuro-grossly nonfocal  ECG sinus bradycardia at a rate of 58. No ST changes.

## 2012-04-07 ENCOUNTER — Encounter (HOSPITAL_COMMUNITY): Payer: Self-pay | Admitting: Physician Assistant

## 2012-04-07 DIAGNOSIS — I10 Essential (primary) hypertension: Secondary | ICD-10-CM

## 2012-04-07 DIAGNOSIS — N039 Chronic nephritic syndrome with unspecified morphologic changes: Secondary | ICD-10-CM

## 2012-04-07 DIAGNOSIS — I208 Other forms of angina pectoris: Secondary | ICD-10-CM

## 2012-04-07 LAB — BASIC METABOLIC PANEL
BUN: 22 mg/dL (ref 6–23)
CO2: 22 mEq/L (ref 19–32)
Chloride: 110 mEq/L (ref 96–112)
Glucose, Bld: 125 mg/dL — ABNORMAL HIGH (ref 70–99)
Potassium: 3.4 mEq/L — ABNORMAL LOW (ref 3.5–5.1)

## 2012-04-07 LAB — CBC
HCT: 26.5 % — ABNORMAL LOW (ref 39.0–52.0)
Hemoglobin: 9.2 g/dL — ABNORMAL LOW (ref 13.0–17.0)
WBC: 7.5 10*3/uL (ref 4.0–10.5)

## 2012-04-07 MED ORDER — PRASUGREL HCL 10 MG PO TABS: 10.0000 mg | ORAL_TABLET | Freq: Every day | ORAL | Status: DC

## 2012-04-07 MED ORDER — NITROGLYCERIN 0.4 MG SL SUBL: 0.4000 mg | SUBLINGUAL_TABLET | SUBLINGUAL | Status: DC | PRN

## 2012-04-07 MED ORDER — POTASSIUM CHLORIDE CRYS ER 20 MEQ PO TBCR
40.0000 meq | EXTENDED_RELEASE_TABLET | Freq: Once | ORAL | Status: AC
  Administered 2012-04-07: 12:00:00 40 meq via ORAL
  Filled 2012-04-07: qty 2

## 2012-04-07 MED ORDER — SILDENAFIL CITRATE 50 MG PO TABS: 50.0000 mg | ORAL_TABLET | Freq: Every day | ORAL | Status: DC | PRN

## 2012-04-07 MED ORDER — POTASSIUM CHLORIDE CRYS ER 20 MEQ PO TBCR
EXTENDED_RELEASE_TABLET | ORAL | Status: AC
  Filled 2012-04-07: qty 2

## 2012-04-07 MED FILL — Dextrose Inj 5%: INTRAVENOUS | Qty: 50 | Status: AC

## 2012-04-07 NOTE — Progress Notes (Signed)
CARDIAC REHAB PHASE I   PRE:  Rate/Rhythm: 69 SR  BP:  Supine:   Sitting: 118/44  Standing:    SaO2:   MODE:  Ambulation: 1000 ft   POST:  Rate/Rhythem: 82 SR  BP:  Supine:   Sitting: 130/65  Standing:    SaO2:  0905-1015  Pt tolerated ambulation well without c/o of cp or SOB. VS stable Completed discharge education with pt and wife. They voice understanding. Pt declines Outpt CRP due to his work hours.  Beatrix Fetters

## 2012-04-07 NOTE — Discharge Summary (Signed)
Discharge Summary   Patient ID: Todd Spears MRN: [id number], DOB/AGE: [birth year] 66 y.o. Admit date: 04/06/2012 D/C date:     04/07/2012  Primary Cardiologist: Jens Som  Primary Discharge Diagnoses:  1. CAD s/p PTCA/stenting of the mid LAD and mid RCA 04/06/12 2. Mild AS by echo 03/24/12 3. Anemia 4. Vagal episode post-cath  Secondary Discharge Diagnoses:  1. Allergic rhinitis 2. GERD 3. HL 4. HTN 5. Osteoarthritis 6. Erectile dysfunction 7. Carotid bruit 8. Renal insufficiency 9. Asthma  Hospital Course: 66 year old male with no prior cardiac history presented to the office 10/14 for evaluation of chest pain. It was in the left upper chest area without radiation or associated symptoms. He has also had some dyspnea with extreme activities. No orthopnea, PND, palpitations, syncope or exertional chest pain. He did have occasional mild pedal edema. EKG showed sinus bradycardia but no ST changes. 2D echo 10/22 showed normal EF 55-65%, mild LVH, mild AS, PA pressure . ETT was arranged 04/03/12 which was abnormal with positive EKG for ischemia. Test was stopped after 4 minutes for dyspnea and fatigue, clinically negative for CP. 81mg  ASA was started. He was set up for outpatient cath in the [facility] and came in for this procedure 04/06/12. This demonstrated 90% mid LAD, 95% mid RCA, EF 55%. He subsequently underwent PTCA and stenting of the mid LAD and mid RCA. The patient tolerated the procedure well. However, yesterday evening the patient was sitting on the side of the bed for 5 minutes, BP 98/38, tele 47-55 - he voiced a sudden need to move his bowels. He was told to stay in bed because of the drop in his BP but he insisted on getting up to [facility] and became nauseated, pale and diaphoretic. He did not lose consciousness but affect quickly became very distant. He had a soft dark BM. No overt bleeding. No groin hematoma development. No back pain. He was helped back to bed and given a 200cc IVF  bolus with improvement in his symptoms. His antihypertensives were held. This morning he is feeling better. He has ambulated without difficulty. Hgb is 9.2 today but he has a significant hx of anemia and reportedly receives iron infusions as an outpatient - discussed with Dr. Swaziland who feels this is likely dilutional/procedural related - we will monitor as OP for now. The patient will be instructed to seek medical attention if he has blood in stool or dark tarry stools. Given his hypotension, we will also keep his lisinopril, Cardura, and chlorthalidone on hold per Dr. Swaziland. (Of note, his lisinopril/chlorthalidone had been on hold prior to admission due to renal insufficiency). The patient will be instructed to check his BP periodically as an outpatient and to let us know if it starts running higher. Will defer statin initiation to outpatient decision. Dr. Swaziland has seen and examined the patient and feels he is stable for discharge.  Discharge Vitals: Blood pressure 118/44, pulse 68, temperature 98.1 F (36.7 C), temperature source Oral, resp. rate 19, height 5\' 7"  (1.702 m), weight 241 lb 13.5 oz (109.7 kg), SpO2 100.00%.  Labs: Lab Results  Component Value Date   WBC 7.5 04/07/2012   HGB 9.2* 04/07/2012   HCT 26.5* 04/07/2012   MCV 87.5 04/07/2012   PLT 171 04/07/2012     Lab 04/07/12 0635  NA 140  K 3.4*  CL 110  CO2 22  BUN 22  CREATININE 1.16  CALCIUM 9.0  PROT --  BILITOT --  ALKPHOS --  ALT --  AST --  GLUCOSE 125*    Lab Results  Component Value Date   CHOL 220* 12/10/2010   HDL 56.60 12/10/2010   TRIG 62.1 12/10/2010     Diagnostic Studies/Procedures   Dg Chest 2 View 04/03/2012  *RADIOLOGY REPORT*  Clinical Data: Chest pains, shortness of breath, abnormal stress test, for cardiac catheterization  CHEST - 2 VIEW  Comparison: None.  Findings: No active infiltrate or effusion is seen.  Mediastinal contours appear normal.  The heart is mildly enlarged.  There are degenerative  changes throughout the thoracic spine.  There appears to be osteolysis of the distal right clavicle.  This can be seen with trauma, arthritis, or renal osteodystrophy.  IMPRESSION: No active lung disease.  Osteolysis of the distal right clavicle as noted above.   Original Report Authenticated By: Dwyane Dee, M.D.    2D echo 03/24/12 Study Conclusions - Left ventricle: The cavity size was normal. Wall thickness was increased in a pattern of mild LVH. Systolic function was normal. The estimated ejection fraction was in the range of 55% to 65%. Wall motion was normal; there were no regional wall motion abnormalities. Left ventricular diastolic function parameters were normal. - Aortic valve: There was mild stenosis. - Left atrium: The atrium was mildly dilated. - Atrial septum: No defect or patent foramen ovale was identified. - Pulmonary arteries: PA peak pressure: 32mm Hg (S).  Cath 04/06/12 Coronary Arteries:  Right dominant with no anomalies  LM: Normal  LAD: 90% mid vessel stenosis  IM: 30-40% calcific disease  D1: 30% multiple discrete lesions  Circumflex: Normal  OM1: normal  OM2: normal  RCA: 95% mid vessel stenosis  PDA: normal  PLA: normal  Ventriculography: EF: 55 %, no RWMA;s  Hemodynamics:  Aortic Pressure: 134 53 mmHg LV Pressure: 131 12 mmHg   Discharge Medications   Current Discharge Medication List    START taking these medications   Details  nitroGLYCERIN (NITROSTAT) 0.4 MG SL tablet Place 1 tablet (0.4 mg total) under the tongue every 5 (five) minutes as needed for chest pain (up to 3 doses). Do not take this medication within 24 hours of taking a Viagra tablet. Qty: 25 tablet, Refills: 4    prasugrel (EFFIENT) 10 MG TABS Take 1 tablet (10 mg total) by mouth daily. Qty: 30 tablet, Refills: 6      CONTINUE these medications which have CHANGED   Details  sildenafil (VIAGRA) 50 MG tablet Take 1 tablet (50 mg total) by mouth daily as needed. For erectile  dysfunction. Do not take this medication within 24 hours of taking a nitroglycerin tablet or nitroglycerin-containing medications.      CONTINUE these medications which have NOT CHANGED   Details  albuterol (PROVENTIL HFA;VENTOLIN HFA) 108 (90 BASE) MCG/ACT inhaler Inhale 2 puffs into the lungs every 6 (six) hours as needed. For shortness of breath    amLODipine (NORVASC) 10 MG tablet Take 10 mg by mouth daily.     aspirin 81 MG chewable tablet Chew 81 mg by mouth daily.    cetirizine (ZYRTEC) 10 MG tablet Take 10 mg by mouth daily.      colchicine 0.6 MG tablet Take 0.6 mg by mouth daily as needed. For gout    Cyanocobalamin (VITAMIN B 12 PO) Take 1 tablet by mouth daily.     halobetasol (ULTRAVATE) 0.05 % cream Apply topically 2 (two) times daily. Qty: 50 g, Refills: 4   Associated Diagnoses: Allergic rhinitis, cause unspecified  metaxalone (SKELAXIN) 800 MG tablet Take 800 mg by mouth 2 (two) times daily as needed. For pain    metoprolol succinate (TOPROL-XL) 50 MG 24 hr tablet Take 50 mg by mouth daily. Take with or immediately following a meal.    mometasone (NASONEX) 50 MCG/ACT nasal spray Place 2 sprays into the nose daily. Qty: 17 g, Refills: 3   Associated Diagnoses: Allergic rhinitis, cause unspecified    NEXIUM 40 MG capsule TAKE 1 CAPSULE DAILY BEFORE BREAKFAST Qty: 100 capsule, Refills: 2    traMADol (ULTRAM) 50 MG tablet One half to one tablet 3 times daily when necessary for pain Qty: 30 tablet, Refills: 1      STOP taking these medications     chlorthalidone (HYGROTON) 25 MG tablet Comments:  Reason for Stopping:       doxazosin (CARDURA) 8 MG tablet Comments:  Reason for Stopping:       lisinopril (PRINIVIL,ZESTRIL) 40 MG tablet Comments:  Reason for Stopping:          Disposition   The patient will be discharged in stable condition to home. Discharge Orders    Future Appointments: Provider: Department: Dept Phone: Center:   04/09/2012 8:00 AM  [facility] Main Office [place]) [phone] [facility]   04/21/2012 1:30 PM Rosalio Macadamia, NP [facility] Main Office [place]) [phone] [facility]     Future Orders Please Complete By Expires   Diet - low sodium heart healthy      Increase activity slowly      Comments:   No driving for 1 week. No lifting over 5 lbs for 1 week. No sexual activity for 1 week. Keep procedure site clean & dry. If you notice increased pain, swelling, bleeding or pus, call/return!  You may shower, but no soaking baths/hot tubs/pools for 1 week.   Discharge instructions      Comments:   - If you develop rectal bleeding, blood in your stool, or dark tarry stools, please contact your doctor immediately. - Check your blood pressure daily if possible. Since some of your medications have been held, we will need to see what your blood pressure is doing outside the hospital. If you find that your blood pressure numbers are routinely over 130 on the top number or 80 on the bottom number, call your doctor.     Follow-up Information    Follow up with [facility]. (You will have a complete blood count to trend your anemia 04/09/12 - can come anytime between 8:30am-4:30pm (except 2-2:30 lab is closed) )    Contact information:   [address] [place] [address] [phone]      Follow up with Norma Fredrickson, NP. (04/21/12 at 1:30pm)    Contact information:   [facility] [address] [place] [address] [phone]       Follow up with TODD,JEFFREY Freida Busman, MD. (To follow your anemia. Your blood sugar was also mildly elevated and needs to be followed.)    Contact information:   [address] Christena Flake [facility] [place] [address] [phone]            Duration of Discharge Encounter: Greater than 30 minutes including physician and PA time.  Signed, Dayna Dunn [name] 04/07/2012, 11:11 AM

## 2012-04-07 NOTE — Discharge Summary (Signed)
Patient seen and examined and history reviewed. Agree with above findings and plan. Patient had a hypotensive episode when first out of bed for BM last night. Responded to fluid challenge. Sounds vagal to me. Feels well this am. Hgb drop noted. No significant groin hematoma. Patient has a history of chronic anemia and has periodic iron infusions. Some of drop explained by cath procedure and dilution. Will follow closely as outpatient. Will hold ACEi and diuretic until seen back as outpatient and renal function rechecked.  Theron Arista [facility] 04/07/2012 12:28 PM

## 2012-04-08 ENCOUNTER — Telehealth: Payer: Self-pay | Admitting: Nurse Practitioner

## 2012-04-08 ENCOUNTER — Other Ambulatory Visit: Payer: Self-pay | Admitting: *Deleted

## 2012-04-08 DIAGNOSIS — D631 Anemia in chronic kidney disease: Secondary | ICD-10-CM

## 2012-04-08 NOTE — Telephone Encounter (Signed)
Patient started on Effient Monday.  Is going to Rheumatologist for gout and arthritis in feet.  He was concerned there may be a conflict with medications they may prescribe.  Advised patient to call after his consult with name and would check then.  States he is actually coming here after his consult for labs.  Advised he could ask for me and I would get name of medication.

## 2012-04-08 NOTE — Telephone Encounter (Signed)
Plz return call to pt regarding medication after stent surgery 04/06/12. Pt can be reached at [phone]

## 2012-04-09 ENCOUNTER — Other Ambulatory Visit (INDEPENDENT_AMBULATORY_CARE_PROVIDER_SITE_OTHER): Payer: BC Managed Care – PPO

## 2012-04-09 ENCOUNTER — Other Ambulatory Visit: Payer: Self-pay | Admitting: *Deleted

## 2012-04-09 DIAGNOSIS — D631 Anemia in chronic kidney disease: Secondary | ICD-10-CM

## 2012-04-09 DIAGNOSIS — N039 Chronic nephritic syndrome with unspecified morphologic changes: Secondary | ICD-10-CM

## 2012-04-09 LAB — CBC WITH DIFFERENTIAL/PLATELET
Eosinophils Relative: 4.3 % (ref 0.0–5.0)
HCT: 27.8 % — ABNORMAL LOW (ref 39.0–52.0)
Lymphocytes Relative: 29.8 % (ref 12.0–46.0)
Lymphs Abs: 2.2 10*3/uL (ref 0.7–4.0)
Monocytes Relative: 8 % (ref 3.0–12.0)
Platelets: 170 10*3/uL (ref 150.0–400.0)
WBC: 7.3 10*3/uL (ref 4.5–10.5)

## 2012-04-09 MED ORDER — PRASUGREL HCL 10 MG PO TABS: 10.0000 mg | ORAL_TABLET | Freq: Every day | ORAL | Status: DC

## 2012-04-09 NOTE — Telephone Encounter (Signed)
Advised patient

## 2012-04-09 NOTE — Telephone Encounter (Signed)
Patient was Rx'd Uloric and Colcrys by rheumatologist today. Discussed with Lawson Fiscal NP and ok to take with Effient

## 2012-04-10 ENCOUNTER — Telehealth: Payer: Self-pay | Admitting: Family Medicine

## 2012-04-10 NOTE — Telephone Encounter (Signed)
Left message on machine for patient to take OTC iron bid until office visit next week

## 2012-04-10 NOTE — Telephone Encounter (Signed)
Pt was discharged from hospital Tues, Nov 5. He had blood work yesterday and was advised to call his PCP and get a RX for Anemia. [phone]  Thanks!  He has an appt 04/16/12 @ 3:15.

## 2012-04-11 ENCOUNTER — Telehealth: Payer: Self-pay | Admitting: Physician Assistant

## 2012-04-11 NOTE — Telephone Encounter (Signed)
Patient called answering service re: high BP and associated headache. Reading his chart, he had been on lisinopril, chlorthalidone and doxazosin prior to cath, however these were held due to mild acute renal insufficiency. He also had an episode of vagal-mediated presyncope post-cath for which these meds were held. There is a note to consider restarted ACEi or diuretic on follow-up (11/18). Patient reports he takes his BP several times a day, and SBP has been running 140s. Today, he noticed his SBP spiked to 160 and he experienced a mild headache. This improved gradually has his BP normalized over time. No increased Na+ intake. He takes Toprol-XL in the AM and Norvasc in the PM. Advised to take an extra 1/2 tab of Toprol-XL (25mg ) for SBP > 140 until he follows up. Advised adding ACEi would be beneficial from a cardiac standpoint, but will defer to Dr. Swaziland when renal function can be monitored on follow-up.    Jacqulyn Bath, [name] 04/11/2012 6:48 PM

## 2012-04-13 ENCOUNTER — Telehealth: Payer: Self-pay | Admitting: Nurse Practitioner

## 2012-04-13 MED ORDER — HYDRALAZINE HCL 25 MG PO TABS: 25.0000 mg | ORAL_TABLET | Freq: Two times a day (BID) | ORAL | Status: DC

## 2012-04-13 NOTE — Telephone Encounter (Signed)
T/w pt about high bp's went over med list and discussed with Norma Fredrickson, NP she started pt on hydralazine (25 mg) bid pt verballu understood, I [facility] today 04/13/12

## 2012-04-13 NOTE — Telephone Encounter (Signed)
Pt calling re BP running high , had two stents put in 04-06-12 and was told to call if problem with BP, PLS CALL  [phone]

## 2012-04-13 NOTE — Telephone Encounter (Signed)
Need to find out his actual readings and get a correct med list. Does he have follow up soon. I will be happy to see him back.

## 2012-04-15 ENCOUNTER — Telehealth: Payer: Self-pay | Admitting: Cardiovascular Disease

## 2012-04-15 NOTE — Telephone Encounter (Signed)
Pt was put on hydralazine but he is concerned because when he read the side effects it said to be careful if you are on asa and he is and he wants to make sure it is ok for him to take this medication and the warning signs also said it will loosen up the arteries so blood can flow throw and he has a stent and he wants to make sure his stent will not loosen up and come out.

## 2012-04-15 NOTE — Telephone Encounter (Signed)
F/u   Pt calling nurse [phone],  Was disconnected in error.

## 2012-04-15 NOTE — Telephone Encounter (Signed)
LMTCB ./CY 

## 2012-04-16 ENCOUNTER — Ambulatory Visit (INDEPENDENT_AMBULATORY_CARE_PROVIDER_SITE_OTHER): Payer: BC Managed Care – PPO | Admitting: Family Medicine

## 2012-04-16 ENCOUNTER — Encounter: Payer: Self-pay | Admitting: Family Medicine

## 2012-04-16 VITALS — BP 128/80 | HR 76 | Temp 98.1°F | Resp 16 | Wt 237.0 lb

## 2012-04-16 DIAGNOSIS — D631 Anemia in chronic kidney disease: Secondary | ICD-10-CM

## 2012-04-16 DIAGNOSIS — N039 Chronic nephritic syndrome with unspecified morphologic changes: Secondary | ICD-10-CM

## 2012-04-16 NOTE — Telephone Encounter (Signed)
Left pt a message to call back. 

## 2012-04-16 NOTE — Progress Notes (Signed)
  Subjective:    Patient ID: Todd Spears, male    DOB: July 26, 1945, 66 y.o.   MRN: [id number]  HPI Todd Spears is a 66 year old married male nonsmoker who comes in today for followup having had 2 stents recently inserted in his coronary arteries. He had a 95% right and a 90% left both of which were asymptomatic. He went for cardiac evaluation at the urging of his wife although he was asymptomatic. The notes say he was having chest pain but he was not. The initial evaluation which included an exam and echocardiogram are normal. He then had a exercise stress test which was abnormal. He was subsequently admitted and had the angiogram and then 2 stents put in. In his hospital stay his hemoglobin was 9.2 however he had an IV running at the time. Hemoglobin today 10.4. He's had a history of low-grade anemia secondary to some renal insufficiency previous iron levels have all been normal    Review of Systems General and hematologic review of systems otherwise negative    Objective:   Physical Exam Well-developed well-nourished male in no acute distress hemoglobin 10.4       Assessment & Plan:  Low-grade anemia plan 1 iron tablet at bedtime followup CBC in 2 months

## 2012-04-16 NOTE — Patient Instructions (Signed)
Take one iron tablet at bedtime  Return in 2 months for followup

## 2012-04-16 NOTE — Telephone Encounter (Signed)
Follow-up:    Patient returned Christine's call from yesterday about his medication as it relates to his stents.  Please call back.

## 2012-04-17 ENCOUNTER — Telehealth: Payer: Self-pay | Admitting: *Deleted

## 2012-04-17 DIAGNOSIS — Z79899 Other long term (current) drug therapy: Secondary | ICD-10-CM

## 2012-04-17 MED ORDER — LISINOPRIL 20 MG PO TABS: 20.0000 mg | ORAL_TABLET | Freq: Every day | ORAL | Status: DC

## 2012-04-17 NOTE — Telephone Encounter (Signed)
Pt aware ./cy 

## 2012-04-17 NOTE — Telephone Encounter (Signed)
He can stop the Hydralazine  Restart his Lisinopril at 20 mg a day. He was previously on 40 mg.   Will need a recheck of his BMET at his return visit.   Thanks

## 2012-04-17 NOTE — Telephone Encounter (Signed)
Lawson Fiscal started the hydralazine.  Cr back to normal consider ACE or changing beta blocker to labatolol

## 2012-04-17 NOTE — Telephone Encounter (Signed)
SPOKE WITH PT THIS AM .PT WAS RECENTLY STARTED ON HYDRALAZINE 25 MG BID  FOR ELEVATED B/P   SINCE STARTING  IS COMPLAINING OF LEG CRAMPS LAST NIGHT  RIGHT HAND AND MUSCLE PAIN TO ARMS ALSO  HAS HAD A COUPLE OF NIGHTMARES  HAS F/U WITH LORI ON TUES WILL FORWARD TO DR Eden Emms FOR REVIEW   B/P THIS AM WAS 140/79 .Todd Spears

## 2012-04-20 NOTE — Telephone Encounter (Signed)
SEE OTHER PHONE NOTE  MED CHANGES  WERE MADE  DID NT TOLERATE .Todd Spears

## 2012-04-21 ENCOUNTER — Encounter: Payer: Self-pay | Admitting: Nurse Practitioner

## 2012-04-21 ENCOUNTER — Ambulatory Visit (INDEPENDENT_AMBULATORY_CARE_PROVIDER_SITE_OTHER): Payer: BC Managed Care – PPO | Admitting: Nurse Practitioner

## 2012-04-21 VITALS — BP 140/68 | HR 64 | Ht 67.0 in | Wt 233.0 lb

## 2012-04-21 DIAGNOSIS — I259 Chronic ischemic heart disease, unspecified: Secondary | ICD-10-CM

## 2012-04-21 LAB — BASIC METABOLIC PANEL
BUN: 27 mg/dL — ABNORMAL HIGH (ref 6–23)
CO2: 25 mEq/L (ref 19–32)
Calcium: 9 mg/dL (ref 8.4–10.5)
Chloride: 97 mEq/L (ref 96–112)
Creatinine, Ser: 1.5 mg/dL (ref 0.4–1.5)
GFR: 48.88 mL/min — ABNORMAL LOW (ref >60.00)
Glucose, Bld: 111 mg/dL — ABNORMAL HIGH (ref 70–99)
Potassium: 4.5 mEq/L (ref 3.5–5.1)
Sodium: 129 mEq/L — ABNORMAL LOW (ref 135–145)

## 2012-04-21 LAB — CBC WITH DIFFERENTIAL/PLATELET
Basophils Absolute: 0.1 10*3/uL (ref 0.0–0.1)
Basophils Relative: 1.1 % (ref 0.0–3.0)
Eosinophils Absolute: 0.2 10*3/uL (ref 0.0–0.7)
Eosinophils Relative: 2.4 % (ref 0.0–5.0)
HCT: 31.8 % — ABNORMAL LOW (ref 39.0–52.0)
Hemoglobin: 10.8 g/dL — ABNORMAL LOW (ref 13.0–17.0)
Lymphocytes Relative: 28.2 % (ref 12.0–46.0)
Lymphs Abs: 2.5 10*3/uL (ref 0.7–4.0)
MCHC: 33.8 g/dL (ref 30.0–36.0)
MCV: 91.3 fl (ref 78.0–100.0)
Monocytes Absolute: 0.8 10*3/uL (ref 0.1–1.0)
Monocytes Relative: 8.7 % (ref 3.0–12.0)
Neutro Abs: 5.2 10*3/uL (ref 1.4–7.7)
Neutrophils Relative %: 59.6 % (ref 43.0–77.0)
Platelets: 243 10*3/uL (ref 150.0–400.0)
RBC: 3.49 Mil/uL — ABNORMAL LOW (ref 4.22–5.81)
RDW: 12.9 % (ref 11.5–14.6)
WBC: 8.7 10*3/uL (ref 4.5–10.5)

## 2012-04-21 NOTE — Patient Instructions (Addendum)
I think you are doing well.  Monitor your blood pressure at home. Keep a record  Stay on your current medicines  We will see you back in 3 months with Dr. Swaziland.  We need to check labs today (BMET/CBC)  Here are my tips to lose weight:  1. Drink only water. You do not need milk, juice, tea, soda or diet soda.  2. Do not eat anything "white". This includes white bread, potatoes, rice or mayo  3. Stay away from fried foods and sweets  4. Your portion should be the size of the palm of your hand.  5. Know what your weaknesses are and avoid.  6. Find an exercise you like and do it every day for 45 to 60 minutes.  Call the [facility] office at [phone] if you have any questions, problems or concerns.

## 2012-04-21 NOTE — Progress Notes (Signed)
Todd Spears Date of Birth: 1945-10-02 Medical Record #[id number]  History of Present Illness: Todd Spears is seen back today for a post hospital visit. He is seen for Dr. Swaziland. He has had a recent abnormal GXT and underwent cardiac cath with subsequent 2 vessel PCI for 90% mid LAD and 95% mid RCA disease. His EF is normal at 55%.  Other issues include CKD, HTN, obesity, gout,,OA, ED, carotid bruit and asthma. His diuretic and ACE was stopped post procedure due to his kidney function and due to some symptomatic hypotension. Since discharge he has had issues with his blood pressure. Tried hydralazine but had nightmares. Recheck of his labs showed normal renal function. His ACE was started back at half dose.   He comes back today. He is here with his wife. He is doing better. No symptoms. BP has trended down nicely at home. His readings are reviewed from home. Doing well on half dose Lisinopril. He is walking each day. Not going to be able to go to cardiac rehab. Anxious to get back to his routines. No problems with his groin. Needs recheck of his labs today.   Current Outpatient Prescriptions on File Prior to Visit  Medication Sig Dispense Refill  . albuterol (PROVENTIL HFA;VENTOLIN HFA) 108 (90 BASE) MCG/ACT inhaler Inhale 2 puffs into the lungs every 6 (six) hours as needed. For shortness of breath      . amLODipine (NORVASC) 10 MG tablet Take 10 mg by mouth daily.       Marland Kitchen aspirin 81 MG chewable tablet Chew 81 mg by mouth daily.      Marland Kitchen COLCRYS 0.6 MG tablet Take 0.6 mg by mouth daily.       . Cyanocobalamin (VITAMIN B 12 PO) Take 1 tablet by mouth daily.       . Febuxostat (ULORIC PO) Take by mouth.      . halobetasol (ULTRAVATE) 0.05 % cream Apply topically 2 (two) times daily.  50 g  4  . lisinopril (PRINIVIL,ZESTRIL) 20 MG tablet Take 1 tablet (20 mg total) by mouth daily.      Marland Kitchen loratadine (CLARITIN) 10 MG tablet Take 10 mg by mouth daily.      . metaxalone (SKELAXIN) 800 MG tablet Take 800  mg by mouth 2 (two) times daily as needed. For pain      . metoprolol succinate (TOPROL-XL) 50 MG 24 hr tablet Take 50 mg by mouth daily. Take with or immediately following a meal.      . NEXIUM 40 MG capsule TAKE 1 CAPSULE DAILY BEFORE BREAKFAST  100 capsule  2  . nitroGLYCERIN (NITROSTAT) 0.4 MG SL tablet Place 1 tablet (0.4 mg total) under the tongue every 5 (five) minutes as needed for chest pain (up to 3 doses). Do not take this medication within 24 hours of taking a Viagra tablet.  25 tablet  4  . prasugrel (EFFIENT) 10 MG TABS Take 1 tablet (10 mg total) by mouth daily.  90 tablet  4  . sildenafil (VIAGRA) 50 MG tablet Take 1 tablet (50 mg total) by mouth daily as needed. For erectile dysfunction. Do not take this medication within 24 hours of taking a nitroglycerin tablet or nitroglycerin-containing medications.      . traMADol (ULTRAM) 50 MG tablet One half to one tablet 3 times daily when necessary for pain  30 tablet  1  . [DISCONTINUED] mometasone (NASONEX) 50 MCG/ACT nasal spray Place 2 sprays into the nose daily.  17  g  3    Allergies  Allergen Reactions  . Hydralazine     Cramps, nightmares  . Prednisone     REACTION: swelling, SOB    Past Medical History  Diagnosis Date  . Allergic rhinitis   . GERD (gastroesophageal reflux disease)   . Hyperlipidemia   . Hypertension   . Osteoarthritis   . ED (erectile dysfunction)   . Carotid bruit     r  . Renal insufficiency   . Asthma   . Obesity   . CAD (coronary artery disease)     a. s/p PTCA/stenting of the mid LAD and mid RCA 04/06/12  . Aortic stenosis     a. Mild by echo 03/24/12.  Marland Kitchen Anemia     Past Surgical History  Procedure Date  . Nm pet lymphoma     left side of neck 1999    History  Smoking status  . Former Smoker  Smokeless tobacco  . Never Used    History  Alcohol Use No    Family History  Problem Relation Age of Onset  . Hypertension    . Stroke    . Coronary artery disease Father     in  his 43s  . Heart disease Father   . Heart attack Father     Review of Systems: The review of systems is per the HPI.  All other systems were reviewed and are negative.  Physical Exam: BP 140/68  Pulse 64  Ht 5\' 7"  (1.702 m)  Wt 233 lb (105.688 kg)  BMI 36.49 kg/m2 Patient is very pleasant and in no acute distress. He is obese. Skin is warm and dry. Color is normal.  HEENT is unremarkable. Normocephalic/atraumatic. PERRL. Sclera are nonicteric. Neck is supple. No masses. No JVD. Lungs are clear. Cardiac exam shows a regular rate and rhythm. Abdomen is soft. Extremities are without edema. Gait and ROM are intact. No gross neurologic deficits noted.  LABORATORY DATA: BMET and CBC pending  Lab Results  Component Value Date   WBC 7.3 04/09/2012   HGB 9.4* 04/09/2012   HCT 27.8* 04/09/2012   PLT 170.0 04/09/2012   GLUCOSE 125* 04/07/2012   CHOL 220* 12/10/2010   TRIG 74.0 12/10/2010   HDL 56.60 12/10/2010   LDLDIRECT 164.9 12/10/2010   ALT 23 12/10/2010   AST 21 12/10/2010   NA 140 04/07/2012   K 3.4* 04/07/2012   CL 110 04/07/2012   CREATININE 1.16 04/07/2012   BUN 22 04/07/2012   CO2 22 04/07/2012   TSH 0.86 12/10/2010   PSA 0.90 12/10/2010   INR 1.0 04/03/2012    Cath Conclusion:  Coronary Arteries:  Right dominant with no anomalies  LM: Normal  LAD: 90% mid vessel stenosis  IM: 30-40% calcific disease  D1: 30% multiple discrete lesions  Circumflex: Normal  OM1: normal  OM2: normal  RCA: 95% mid vessel stenosis  PDA: normal  PLA: normal  Ventriculography: EF: 55 %, no RWMA;s   Impression: Discussed with Dr Swaziland PCI/Stent to LAD and RCA. Will load with Plavix in [facility] lab  Todd Spears  04/06/2012  11:23 AM  PCI Conclusions:   Successful intracoronary stenting of the mid LAD and mid RCA with drug-eluting stents.  Recommendations: Dual antiplatelet therapy for one year.   Theron Arista [facility]  04/06/2012, 5:42 PM   Assessment / Plan:  1. CAD - recent 2 vessel PCI - doing well. We  discussed the need for walking daily with a goal of  45 to 60 minutes per day. Same medicines. Will see him back in about 3 months.   2. HTN - back on half dose of his ACE. Needs to recheck labs today. BP looks good currently.   3. Anemia - is apparently chronic - will need to follow with his DAPT. Recheck today.   4. CKD - rechecking labs today  Overall, he is doing well. We have had a nice discussion regarding his CV risk factor modification. Will see him back in 3 months.   Patient is agreeable to this plan and will call if any problems develop in the interim.

## 2012-04-22 ENCOUNTER — Other Ambulatory Visit: Payer: Self-pay | Admitting: *Deleted

## 2012-04-22 DIAGNOSIS — E871 Hypo-osmolality and hyponatremia: Secondary | ICD-10-CM

## 2012-04-29 ENCOUNTER — Ambulatory Visit (INDEPENDENT_AMBULATORY_CARE_PROVIDER_SITE_OTHER): Payer: BC Managed Care – PPO | Admitting: *Deleted

## 2012-04-29 ENCOUNTER — Telehealth: Payer: Self-pay | Admitting: Physician Assistant

## 2012-04-29 ENCOUNTER — Other Ambulatory Visit: Payer: Self-pay

## 2012-04-29 DIAGNOSIS — E871 Hypo-osmolality and hyponatremia: Secondary | ICD-10-CM

## 2012-04-29 LAB — BASIC METABOLIC PANEL
BUN: 23 mg/dL (ref 6–23)
Calcium: 9 mg/dL (ref 8.4–10.5)
Creat: 1.39 mg/dL — ABNORMAL HIGH (ref 0.50–1.35)

## 2012-04-29 MED ORDER — PRASUGREL HCL 10 MG PO TABS: 10.0000 mg | ORAL_TABLET | Freq: Every day | ORAL | Status: DC

## 2012-04-29 NOTE — Telephone Encounter (Signed)
Stat labs called to me this evening. Na abnormal at 128, Cr 1.3 stable from labs that were obtained from last week. OV note reviewed. Will forward to Norma Fredrickson NP for review. Dayna Dunn [name]

## 2012-04-30 NOTE — Telephone Encounter (Signed)
Already reviewed with new orders per Dr. Swaziland.

## 2012-05-01 ENCOUNTER — Other Ambulatory Visit: Payer: Self-pay

## 2012-05-01 MED ORDER — PRASUGREL HCL 10 MG PO TABS: 10.0000 mg | ORAL_TABLET | Freq: Every day | ORAL | Status: DC

## 2012-06-16 ENCOUNTER — Ambulatory Visit (INDEPENDENT_AMBULATORY_CARE_PROVIDER_SITE_OTHER): Payer: BC Managed Care – PPO | Admitting: Family Medicine

## 2012-06-16 ENCOUNTER — Encounter: Payer: Self-pay | Admitting: Family Medicine

## 2012-06-16 VITALS — BP 160/80 | Temp 98.4°F | Wt 220.0 lb

## 2012-06-16 DIAGNOSIS — N039 Chronic nephritic syndrome with unspecified morphologic changes: Secondary | ICD-10-CM

## 2012-06-16 DIAGNOSIS — D631 Anemia in chronic kidney disease: Secondary | ICD-10-CM

## 2012-06-16 DIAGNOSIS — D649 Anemia, unspecified: Secondary | ICD-10-CM

## 2012-06-16 NOTE — Progress Notes (Signed)
  Subjective:    Patient ID: Todd Spears, male    DOB: 08-08-1945, 67 y.o.   MRN: [id number]  HPI Todd Spears is a 67 year old male who comes in today for followup of anemia  We've had him on iron one tablet daily his hemoglobin a month ago was 10.8 today's 11.9. No side effects and the iron   Review of Systems General review of systems otherwise negative    Objective:   Physical Exam Well-developed well-nourished male in no acute distress       Assessment & Plan:  Iron deficiency anemia responding to oral iron continue iron followup hemoglobin in 2 months

## 2012-06-16 NOTE — Patient Instructions (Signed)
Continue one iron tablet daily at bedtime  Followup hemoglobin in 2 months

## 2012-07-02 ENCOUNTER — Encounter: Payer: Self-pay | Admitting: Cardiology

## 2012-07-14 ENCOUNTER — Ambulatory Visit: Payer: BC Managed Care – PPO | Admitting: Cardiology

## 2012-07-23 ENCOUNTER — Ambulatory Visit (INDEPENDENT_AMBULATORY_CARE_PROVIDER_SITE_OTHER): Payer: BC Managed Care – PPO | Admitting: Cardiology

## 2012-07-23 ENCOUNTER — Encounter: Payer: Self-pay | Admitting: Cardiology

## 2012-07-23 VITALS — BP 144/68 | HR 55 | Ht 67.0 in | Wt 213.0 lb

## 2012-07-23 DIAGNOSIS — D631 Anemia in chronic kidney disease: Secondary | ICD-10-CM

## 2012-07-23 DIAGNOSIS — E785 Hyperlipidemia, unspecified: Secondary | ICD-10-CM

## 2012-07-23 DIAGNOSIS — I10 Essential (primary) hypertension: Secondary | ICD-10-CM

## 2012-07-23 DIAGNOSIS — I2510 Atherosclerotic heart disease of native coronary artery without angina pectoris: Secondary | ICD-10-CM

## 2012-07-23 NOTE — Patient Instructions (Signed)
Continue your current therapy  I will see you again in 6 months.   

## 2012-07-24 NOTE — Progress Notes (Signed)
Todd Spears Date of Birth: Jun 14, 1945 Medical Record #[id number]  History of Present Illness: Todd Spears is seen back today for a followup visit. He has had an abnormal GXT and underwent cardiac cath with subsequent 2 vessel PCI for 90% mid LAD and 95% mid RCA disease on 04/06/2012. His EF is normal at 55%.  Other issues include CKD, HTN, obesity, gout,,OA, ED, carotid bruit and asthma. Since his last visit he reports that he is unwell. Blood  Pressure readings at home have been very good with typical readings between 110 and 115 systolic. He is still working full time. He walks one hour a day on his treadmill. He is eating a healthy diet and has lost 20 pounds. He feels great. He denies any chest pain or shortness of breath.  Current Outpatient Prescriptions on File Prior to Visit  Medication Sig Dispense Refill  . albuterol (PROVENTIL HFA;VENTOLIN HFA) 108 (90 BASE) MCG/ACT inhaler Inhale 2 puffs into the lungs every 6 (six) hours as needed. For shortness of breath      . amLODipine (NORVASC) 10 MG tablet Take 10 mg by mouth daily.       Marland Kitchen aspirin 81 MG chewable tablet Chew 81 mg by mouth daily.      Marland Kitchen COLCRYS 0.6 MG tablet Take 0.6 mg by mouth daily.       . Cyanocobalamin (VITAMIN B 12 PO) Take 1 tablet by mouth daily.       . Febuxostat (ULORIC PO) Take by mouth.      . halobetasol (ULTRAVATE) 0.05 % cream Apply topically 2 (two) times daily.  50 g  4  . loratadine (CLARITIN) 10 MG tablet Take 10 mg by mouth daily.      . metaxalone (SKELAXIN) 800 MG tablet Take 800 mg by mouth 2 (two) times daily as needed. For pain      . metoprolol succinate (TOPROL-XL) 50 MG 24 hr tablet Take 50 mg by mouth daily. Take with or immediately following a meal.      . mometasone (NASONEX) 50 MCG/ACT nasal spray Place 2 sprays into the nose as needed.      Marland Kitchen NEXIUM 40 MG capsule TAKE 1 CAPSULE DAILY BEFORE BREAKFAST  100 capsule  2  . nitroGLYCERIN (NITROSTAT) 0.4 MG SL tablet Place 1 tablet (0.4 mg total)  under the tongue every 5 (five) minutes as needed for chest pain (up to 3 doses). Do not take this medication within 24 hours of taking a Viagra tablet.  25 tablet  4  . prasugrel (EFFIENT) 10 MG TABS Take 1 tablet (10 mg total) by mouth daily.  90 tablet  2  . sildenafil (VIAGRA) 50 MG tablet Take 1 tablet (50 mg total) by mouth daily as needed. For erectile dysfunction. Do not take this medication within 24 hours of taking a nitroglycerin tablet or nitroglycerin-containing medications.      . traMADol (ULTRAM) 50 MG tablet One half to one tablet 3 times daily when necessary for pain  30 tablet  1   No current facility-administered medications on file prior to visit.    Allergies  Allergen Reactions  . Hydralazine     Cramps, nightmares  . Prednisone     REACTION: swelling, SOB    Past Medical History  Diagnosis Date  . Allergic rhinitis   . GERD (gastroesophageal reflux disease)   . Hyperlipidemia   . Hypertension   . Osteoarthritis   . ED (erectile dysfunction)   . Carotid  bruit     r  . Renal insufficiency   . Asthma   . Obesity   . CAD (coronary artery disease)     a. s/p PTCA/stenting of the mid LAD and mid RCA 04/06/12  . Aortic stenosis     a. Mild by echo 03/24/12.  Marland Kitchen Anemia     Past Surgical History  Procedure Laterality Date  . Nm pet lymphoma      left side of neck 1999    History  Smoking status  . Former Smoker  Smokeless tobacco  . Never Used    History  Alcohol Use No    Family History  Problem Relation Age of Onset  . Hypertension    . Stroke    . Coronary artery disease Father     in his 55s  . Heart disease Father   . Heart attack Father     Review of Systems: The review of systems is per the HPI.  All other systems were reviewed and are negative.  Physical Exam: BP 144/68  Pulse 55  Ht 5\' 7"  (1.702 m)  Wt 213 lb (96.616 kg)  BMI 33.35 kg/m2  SpO2 97% Patient is very pleasant and in no acute distress. He has clearly lost a  significant amount of weight. Skin is warm and dry. Color is normal.  HEENT is unremarkable. Normocephalic/atraumatic. PERRL. Sclera are nonicteric. Neck is supple. No masses. No JVD. Lungs are clear. Cardiac exam shows a regular rate and rhythm. Abdomen is soft. Extremities are without edema. Gait and ROM are intact. No gross neurologic deficits noted.  LABORATORY DATA: BMET and CBC pending Dated 07/14/2012: Hemoglobin 11.8. Sodium 126. Potassium 4.7. BUN 14, creatinine 1.23. Urinalysis is negative. Iron levels were normal.  Assessment / Plan:  1. CAD - status post 2 vessel PCI with DES- doing well. Continue current therapy with aspirin and Effient. We will followup in 6 months and if he continues to do well at that time we will stop the Efffient.  2. HTN - blood pressure control is excellent.  3. Anemia   4. CKD - stable

## 2012-08-06 ENCOUNTER — Encounter: Payer: Self-pay | Admitting: Family Medicine

## 2012-08-09 ENCOUNTER — Other Ambulatory Visit: Payer: Self-pay | Admitting: Family Medicine

## 2012-08-18 ENCOUNTER — Ambulatory Visit: Payer: BC Managed Care – PPO | Admitting: Family Medicine

## 2012-09-15 ENCOUNTER — Ambulatory Visit (INDEPENDENT_AMBULATORY_CARE_PROVIDER_SITE_OTHER): Payer: BC Managed Care – PPO | Admitting: Family Medicine

## 2012-09-15 ENCOUNTER — Encounter: Payer: Self-pay | Admitting: Family Medicine

## 2012-09-15 VITALS — BP 130/80 | Temp 98.3°F | Wt 214.0 lb

## 2012-09-15 DIAGNOSIS — D649 Anemia, unspecified: Secondary | ICD-10-CM

## 2012-09-15 DIAGNOSIS — G2581 Restless legs syndrome: Secondary | ICD-10-CM

## 2012-09-15 DIAGNOSIS — J309 Allergic rhinitis, unspecified: Secondary | ICD-10-CM

## 2012-09-15 LAB — CBC WITH DIFFERENTIAL/PLATELET
Basophils Absolute: 0.1 10*3/uL (ref 0.0–0.1)
Eosinophils Relative: 4.9 % (ref 0.0–5.0)
Lymphs Abs: 2.4 10*3/uL (ref 0.7–4.0)
MCV: 89 fl (ref 78.0–100.0)
Monocytes Absolute: 0.5 10*3/uL (ref 0.1–1.0)
Monocytes Relative: 7.7 % (ref 3.0–12.0)
Neutrophils Relative %: 49.4 % (ref 43.0–77.0)
Platelets: 210 10*3/uL (ref 150.0–400.0)
RDW: 12.9 % (ref 11.5–14.6)
WBC: 6.5 10*3/uL (ref 4.5–10.5)

## 2012-09-15 LAB — RETICULOCYTES
RBC.: 3.75 MIL/uL — ABNORMAL LOW (ref 4.22–5.81)
Retic Ct Pct: 1.2 % (ref 0.4–2.3)

## 2012-09-15 LAB — POCT HEMOGLOBIN: Hemoglobin: 11.2 g/dL — AB (ref 14.1–18.1)

## 2012-09-15 LAB — FERRITIN: Ferritin: 558.6 ng/mL — ABNORMAL HIGH (ref 22.0–322.0)

## 2012-09-15 MED ORDER — ALBUTEROL SULFATE HFA 108 (90 BASE) MCG/ACT IN AERS: 2.0000 | INHALATION_SPRAY | Freq: Four times a day (QID) | RESPIRATORY_TRACT | Status: DC | PRN

## 2012-09-15 MED ORDER — MOMETASONE FUROATE 50 MCG/ACT NA SUSP: 2.0000 | NASAL | Status: DC | PRN

## 2012-09-15 MED ORDER — TRAMADOL HCL 50 MG PO TABS: ORAL_TABLET | ORAL | Status: DC

## 2012-09-15 NOTE — Patient Instructions (Addendum)
Labs today  Stop the iron  Will call you when I get all your lab work back

## 2012-09-15 NOTE — Progress Notes (Signed)
  Subjective:    Patient ID: Todd Spears, male    DOB: Jun 11, 1945, 67 y.o.   MRN: [id number]  HPI Todd Spears is a 67 year old male who comes in today for followup of anemia  We saw him a couple months ago his hemoglobin dropped to 10. We held his aspirin and start him on iron. Hemoglobin then increased to 11.9. He's back today for followup his hemoglobin today is 11.2. He states he feels well has no complaints   Review of Systems    review of systems negative no fever weight loss change in bowel habits rectal bleeding etc. Etc. Objective:   Physical Exam Well-developed well-nourished and in no acute distress       Assessment & Plan:  Anemia unknown etiology

## 2012-10-01 ENCOUNTER — Telehealth: Payer: Self-pay | Admitting: Family Medicine

## 2012-10-01 DIAGNOSIS — R899 Unspecified abnormal finding in specimens from other organs, systems and tissues: Secondary | ICD-10-CM

## 2012-10-01 DIAGNOSIS — D649 Anemia, unspecified: Secondary | ICD-10-CM

## 2012-10-01 NOTE — Telephone Encounter (Signed)
Todd Spears, please add referral, thank you!

## 2012-10-01 NOTE — Telephone Encounter (Signed)
Tiffany called from the [facility] and stated that the PT attempted to call and schedule an appt. He stated that he was instructed to do so by Dr. Tawanna Cooler, although there is not a referral , and I found no details regarding this in Dr. Barbette Or last note. Please assist.

## 2012-10-05 ENCOUNTER — Telehealth: Payer: Self-pay | Admitting: Family Medicine

## 2012-10-05 NOTE — Telephone Encounter (Signed)
Spoke with patient.

## 2012-10-05 NOTE — Telephone Encounter (Signed)
Ok to see Dr Clelia Croft per Dr Tawanna Cooler

## 2012-10-05 NOTE — Telephone Encounter (Signed)
PT states that he received an appt to see Dr. Clelia Croft because Dr. Elnita Maxwell is only focusing on GI patients. He wanted to speak with Dr. Tawanna Cooler about this physician and if they are competent. Please assist.

## 2012-10-07 ENCOUNTER — Telehealth: Payer: Self-pay | Admitting: Oncology

## 2012-10-07 NOTE — Telephone Encounter (Signed)
C/D 10/07/12 for appt. 11/03/12

## 2012-10-30 ENCOUNTER — Other Ambulatory Visit: Payer: Self-pay | Admitting: Oncology

## 2012-10-30 DIAGNOSIS — N039 Chronic nephritic syndrome with unspecified morphologic changes: Secondary | ICD-10-CM

## 2012-10-30 DIAGNOSIS — D631 Anemia in chronic kidney disease: Secondary | ICD-10-CM

## 2012-11-03 ENCOUNTER — Ambulatory Visit (HOSPITAL_BASED_OUTPATIENT_CLINIC_OR_DEPARTMENT_OTHER): Payer: BC Managed Care – PPO

## 2012-11-03 ENCOUNTER — Telehealth: Payer: Self-pay | Admitting: Oncology

## 2012-11-03 ENCOUNTER — Ambulatory Visit: Payer: BC Managed Care – PPO | Admitting: Family Medicine

## 2012-11-03 ENCOUNTER — Ambulatory Visit (HOSPITAL_BASED_OUTPATIENT_CLINIC_OR_DEPARTMENT_OTHER): Payer: BC Managed Care – PPO | Admitting: Oncology

## 2012-11-03 ENCOUNTER — Encounter: Payer: Self-pay | Admitting: Oncology

## 2012-11-03 ENCOUNTER — Other Ambulatory Visit (HOSPITAL_BASED_OUTPATIENT_CLINIC_OR_DEPARTMENT_OTHER): Payer: BC Managed Care – PPO | Admitting: Lab

## 2012-11-03 DIAGNOSIS — N039 Chronic nephritic syndrome with unspecified morphologic changes: Secondary | ICD-10-CM

## 2012-11-03 DIAGNOSIS — N189 Chronic kidney disease, unspecified: Secondary | ICD-10-CM

## 2012-11-03 DIAGNOSIS — D631 Anemia in chronic kidney disease: Secondary | ICD-10-CM

## 2012-11-03 DIAGNOSIS — D649 Anemia, unspecified: Secondary | ICD-10-CM

## 2012-11-03 LAB — CBC WITH DIFFERENTIAL/PLATELET
Basophils Absolute: 0.1 10*3/uL (ref 0.0–0.1)
Eosinophils Absolute: 0.4 10*3/uL (ref 0.0–0.5)
HCT: 32 % — ABNORMAL LOW (ref 38.4–49.9)
HGB: 11 g/dL — ABNORMAL LOW (ref 13.0–17.1)
MONO#: 0.6 10*3/uL (ref 0.1–0.9)
NEUT#: 3.4 10*3/uL (ref 1.5–6.5)
NEUT%: 53.2 % (ref 39.0–75.0)
RDW: 12.9 % (ref 11.0–14.6)
WBC: 6.5 10*3/uL (ref 4.0–10.3)
lymph#: 2 10*3/uL (ref 0.9–3.3)

## 2012-11-03 LAB — COMPREHENSIVE METABOLIC PANEL (CC13)
Albumin: 3.5 g/dL (ref 3.5–5.0)
BUN: 14.5 mg/dL (ref 7.0–26.0)
CO2: 27 mEq/L (ref 22–29)
Calcium: 9.1 mg/dL (ref 8.4–10.4)
Chloride: 100 mEq/L (ref 98–107)
Creatinine: 1.1 mg/dL (ref 0.7–1.3)
Potassium: 4.4 mEq/L (ref 3.5–5.1)

## 2012-11-03 NOTE — Progress Notes (Signed)
Mailed updated medication list to patient's home 

## 2012-11-03 NOTE — Progress Notes (Signed)
Reason for Referral: Anemia.  HPI: Todd Spears is a 67 year old gentleman native of [place] where he lived the majority of his life. He is a gentleman with a past medical history significant for coronary artery disease and hyperlipidemia. He has also history of renal insufficiency and followed by nephrology. He was diagnosed with renal insufficiency and found to have a low ferritin and was treated with IV iron infusions in the past. And his hemoglobin has ranged between 9 and 11 dating back to 2008. Patient developed coronary artery disease and required stenting in 2013. Most recently his hemoglobin still fluctuated between 9 and 10 and was started on Integra plus iron supplementation. Clinically he has been asymptomatic, he has not reported any symptoms of chest pain or trouble breathing he had not reported any shortness of breath he had not reported any weakness fatigue or tiredness. His continued to perform activities of daily living without any hindrance or decline. His continued to work full time and that has not changed.  He has not reported any bleeding symptoms. He has not reported any hematochezia or melena and he is up to speed on his colonoscopies. He has not reported any GU bleeding has not reported any constitutional symptoms at this time.   Past Medical History  Diagnosis Date  . Allergic rhinitis   . GERD (gastroesophageal reflux disease)   . Hyperlipidemia   . Hypertension   . Osteoarthritis   . ED (erectile dysfunction)   . Carotid bruit     r  . Renal insufficiency   . Asthma   . Obesity   . CAD (coronary artery disease)     a. s/p PTCA/stenting of the mid LAD and mid RCA 04/06/12  . Aortic stenosis     a. Mild by echo 03/24/12.  Marland Kitchen Anemia   :  Past Surgical History  Procedure Laterality Date  . Nm pet lymphoma      left side of neck 1999  :  Current outpatient prescriptions:albuterol (PROVENTIL HFA;VENTOLIN HFA) 108 (90 BASE) MCG/ACT inhaler,  Inhale 2 puffs into the lungs every 6 (six) hours as needed. For shortness of breath, Disp: 2 Inhaler, Rfl: 1;  amLODipine (NORVASC) 10 MG tablet, Take 10 mg by mouth daily. , Disp: , Rfl: ;  aspirin 81 MG chewable tablet, Chew 81 mg by mouth daily., Disp: , Rfl: ;  COLCRYS 0.6 MG tablet, Take 0.6 mg by mouth daily. , Disp: , Rfl:  Cyanocobalamin (VITAMIN B 12 PO), Take 1 tablet by mouth daily. , Disp: , Rfl: ;  Febuxostat (ULORIC PO), Take by mouth., Disp: , Rfl: ;  halobetasol (ULTRAVATE) 0.05 % cream, Apply topically 2 (two) times daily., Disp: 50 g, Rfl: 4;  lisinopril (PRINIVIL,ZESTRIL) 20 MG tablet, 10 mg. Half tab daily, Disp: , Rfl: ;  loratadine (CLARITIN) 10 MG tablet, Take 10 mg by mouth daily., Disp: , Rfl:  metaxalone (SKELAXIN) 800 MG tablet, Take 800 mg by mouth 2 (two) times daily as needed. For pain, Disp: , Rfl: ;  metoprolol succinate (TOPROL-XL) 50 MG 24 hr tablet, Take 50 mg by mouth daily. Take with or immediately following a meal., Disp: , Rfl: ;  mometasone (NASONEX) 50 MCG/ACT nasal spray, Place 2 sprays into the nose as needed., Disp: 17 g, Rfl: 11;  NEXIUM 40 MG capsule, TAKE 1 CAPSULE DAILY BEFORE BREAKFAST, Disp: 100 capsule, Rfl: 1 nitroGLYCERIN (NITROSTAT) 0.4 MG SL tablet, Place 1 tablet (0.4 mg total) under the tongue every 5 (five)  minutes as needed for chest pain (up to 3 doses). Do not take this medication within 24 hours of taking a Viagra tablet., Disp: 25 tablet, Rfl: 4;  prasugrel (EFFIENT) 10 MG TABS, Take 1 tablet (10 mg total) by mouth daily., Disp: 90 tablet, Rfl: 2 sildenafil (VIAGRA) 50 MG tablet, Take 1 tablet (50 mg total) by mouth daily as needed. For erectile dysfunction. Do not take this medication within 24 hours of taking a nitroglycerin tablet or nitroglycerin-containing medications., Disp: , Rfl: ;  traMADol (ULTRAM) 50 MG tablet, One half to one tablet 3 times daily when necessary for pain, Disp: 100 tablet, Rfl: 3:  Allergies  Allergen Reactions  .  Hydralazine     Cramps, nightmares  . Prednisone     REACTION: swelling, SOB  :  Family History  Problem Relation Age of Onset  . Hypertension    . Stroke    . Coronary artery disease Father     in his 106s  . Heart disease Father   . Heart attack Father   :  History   Social History  . Marital Status: Married    Spouse Name: N/A    Number of Children: 1  . Years of Education: N/A   Occupational History  .     Social History Main Topics  . Smoking status: Former Games developer  . Smokeless tobacco: Never Used  . Alcohol Use: No  . Drug Use: No  . Sexually Active: Not Currently   Other Topics Concern  . Not on file   Social History Narrative   Retired   Married   Regular exercise- no  :  A comprehensive review of systems was negative.  Exam: ECOG 0 General appearance: alert, cooperative and appears stated age Head: Normocephalic, without obvious abnormality, atraumatic Nose: Nares normal. Septum midline. Mucosa normal. No drainage or sinus tenderness. Throat: lips, mucosa, and tongue normal; teeth and gums normal Neck: no adenopathy, no carotid bruit, no JVD, supple, symmetrical, trachea midline and thyroid not enlarged, symmetric, no tenderness/mass/nodules Resp: clear to auscultation bilaterally Chest wall: no tenderness Cardio: regular rate and rhythm, S1, S2 normal, no murmur, click, rub or gallop GI: soft, non-tender; bowel sounds normal; no masses,  no organomegaly Extremities: extremities normal, atraumatic, no cyanosis or edema Skin: Skin color, texture, turgor normal. No rashes or lesions Lymph nodes: Cervical, supraclavicular, and axillary nodes normal.   Recent Labs  11/03/12 1048  WBC 6.5  HGB 11.0*  HCT 32.0*  PLT 211      Blood smear review: normal     Assessment and Plan:   67 year old gentleman with the following issues:  1. Normocytic normochromic anemia: This appears to be multifactorial in nature and dating back to at least  2008. The differential diagnosis includes anemia of chronic disease, anemia of renal disease and an element of iron deficiency anemia. He is currently taking iron supplementation which have helped keeping his hemoglobin close to baseline between 10 and 11. Other causes of his anemia such as myelodysplastic syndrome and plasma cell disorder such as multiple myeloma should be on the differential and are being evaluated at this time. I've also obtain an erythropoietin level to determine the whether he has an element of anemia of chronic disease as well as anemia of renal disease which is anxious the most likely etiology at this time. I doubt he has a malignant disorder such as multiple myeloma or leukemia but he could have an early signs of myelodysplastic syndrome.  In terms of management, at this time I see you really no need for any growth factor support such as Aranesp or any transfusions. I see no need for a bone marrow biopsy at however I will continue to monitor him closely and repeat blood counts in about 4 months. If his blood counts continue to run around baseline and we'll continue to monitor him. If he develops further cytopenias or there is any signs to suggest a plasma cell disorder and we will consider a bone marrow biopsy. All his questions are answered today.  2. Chronic renal insufficiency appear to be at baseline creatinine as of late which could also be contributing to his anemia.  3. Followup will be in 4 months time.

## 2012-11-03 NOTE — Telephone Encounter (Signed)
gv and printed appt sched and avs for pt  °

## 2012-11-03 NOTE — Progress Notes (Signed)
Checked in new patient. No financial issues. Patient said his POA is his wife, but nothing in writing.

## 2012-11-05 LAB — ERYTHROPOIETIN: Erythropoietin: 8.6 m[IU]/mL (ref 2.6–18.5)

## 2012-11-05 LAB — SPEP & IFE WITH QIG
Alpha-1-Globulin: 4.6 % (ref 2.9–4.9)
Alpha-2-Globulin: 13.3 % — ABNORMAL HIGH (ref 7.1–11.8)
Beta 2: 3.6 % (ref 3.2–6.5)
Gamma Globulin: 10.7 % — ABNORMAL LOW (ref 11.1–18.8)
IgG (Immunoglobin G), Serum: 803 mg/dL (ref 650–1600)
IgM, Serum: 27 mg/dL — ABNORMAL LOW (ref 41–251)
M-Spike, %: 0.17 g/dL

## 2012-11-05 LAB — FERRITIN: Ferritin: 594 ng/mL — ABNORMAL HIGH (ref 22–322)

## 2012-11-09 ENCOUNTER — Encounter: Payer: Self-pay | Admitting: Family Medicine

## 2012-11-09 ENCOUNTER — Ambulatory Visit (INDEPENDENT_AMBULATORY_CARE_PROVIDER_SITE_OTHER): Payer: BC Managed Care – PPO | Admitting: Family Medicine

## 2012-11-09 VITALS — BP 120/68 | Temp 98.0°F | Wt 212.0 lb

## 2012-11-09 DIAGNOSIS — H6123 Impacted cerumen, bilateral: Secondary | ICD-10-CM

## 2012-11-09 DIAGNOSIS — H6120 Impacted cerumen, unspecified ear: Secondary | ICD-10-CM

## 2012-11-09 NOTE — Patient Instructions (Signed)
Return when necessary 

## 2012-11-09 NOTE — Progress Notes (Signed)
  Subjective:    Patient ID: Todd Spears, male    DOB: Mar 06, 1946, 67 y.o.   MRN: [id number]  HPI Todd Spears is a 67 year old married male nonsmoker who comes in today for evaluation of decreased hearing bilaterally  He's had a history of cerumen impactions in the past   Review of Systems    review of systems negative Objective:   Physical Exam  Well-developed and nourished male no acute distress examination the ears shows bilateral cerumen impactions  With a combination of irrigation and a curet the wax was removed from both ears without complications      Assessment & Plan:  Cerumen impactions bilaterally removed with irrigation and a curet no complications

## 2013-01-06 ENCOUNTER — Other Ambulatory Visit: Payer: BC Managed Care – PPO

## 2013-01-06 ENCOUNTER — Other Ambulatory Visit: Payer: Self-pay

## 2013-01-12 ENCOUNTER — Encounter: Payer: BC Managed Care – PPO | Admitting: Family Medicine

## 2013-01-21 ENCOUNTER — Encounter: Payer: Self-pay | Admitting: Cardiology

## 2013-01-21 ENCOUNTER — Other Ambulatory Visit: Payer: Self-pay

## 2013-01-21 ENCOUNTER — Ambulatory Visit (INDEPENDENT_AMBULATORY_CARE_PROVIDER_SITE_OTHER): Payer: BC Managed Care – PPO | Admitting: Cardiology

## 2013-01-21 VITALS — BP 162/68 | HR 53 | Ht 67.0 in | Wt 206.4 lb

## 2013-01-21 DIAGNOSIS — I2510 Atherosclerotic heart disease of native coronary artery without angina pectoris: Secondary | ICD-10-CM

## 2013-01-21 DIAGNOSIS — E785 Hyperlipidemia, unspecified: Secondary | ICD-10-CM

## 2013-01-21 DIAGNOSIS — I10 Essential (primary) hypertension: Secondary | ICD-10-CM

## 2013-01-21 MED ORDER — LISINOPRIL 20 MG PO TABS: 20.0000 mg | ORAL_TABLET | Freq: Every day | ORAL | Status: DC

## 2013-01-21 NOTE — Patient Instructions (Addendum)
Continue your current therapy except increase lisinopril to 20 mg daily.  On November 4 you can stop taking Effient.  We will schedule you for a stress test in November.  I will see you in 6 months.

## 2013-01-21 NOTE — Progress Notes (Signed)
Todd Spears Date of Birth: 09-12-1945 Medical Record #[id number]  History of Present Illness: Todd Spears is seen back today for a followup visit. He has had an abnormal GXT and underwent cardiac cath with subsequent 2 vessel PCI for 90% mid LAD and 95% mid RCA disease on 04/06/2012. His EF is normal at 55%. At that time he really had no significant anginal symptoms. Other issues include CKD, HTN, obesity, gout,,OA, ED, carotid bruit and asthma. On followup today he continues to do very well. He is walking at least an hour a day on his treadmill. He has made significant dietary modifications and has lost an additional 7 pounds. His last renal panel showed improvement in his creatinine.  Current Outpatient Prescriptions on File Prior to Visit  Medication Sig Dispense Refill  . albuterol (PROVENTIL HFA;VENTOLIN HFA) 108 (90 BASE) MCG/ACT inhaler Inhale 2 puffs into the lungs every 6 (six) hours as needed. For shortness of breath  2 Inhaler  1  . amLODipine (NORVASC) 10 MG tablet Take 10 mg by mouth daily.       Marland Kitchen aspirin 81 MG chewable tablet Chew 81 mg by mouth daily.      Marland Kitchen COLCRYS 0.6 MG tablet Take 0.6 mg by mouth daily.       . Cyanocobalamin (VITAMIN B 12 PO) Take 1 tablet by mouth daily.       . Febuxostat (ULORIC PO) Take by mouth.      . FeFum-FePoly-FA-B Cmp-C-Biot (INTEGRA PLUS PO) Take 1 tablet by mouth daily.      . halobetasol (ULTRAVATE) 0.05 % cream Apply topically 2 (two) times daily.  50 g  4  . loratadine (CLARITIN) 10 MG tablet Take 10 mg by mouth daily.      . metaxalone (SKELAXIN) 800 MG tablet Take 800 mg by mouth 2 (two) times daily as needed. For pain      . metoprolol succinate (TOPROL-XL) 50 MG 24 hr tablet Take 50 mg by mouth daily. Take with or immediately following a meal.      . mometasone (NASONEX) 50 MCG/ACT nasal spray Place 2 sprays into the nose as needed.  17 g  11  . Multiple Vitamins-Minerals (VISION-VITE PRESERVE PO) Take 1 tablet by mouth daily.      Marland Kitchen  NEXIUM 40 MG capsule TAKE 1 CAPSULE DAILY BEFORE BREAKFAST  100 capsule  1  . nitroGLYCERIN (NITROSTAT) 0.4 MG SL tablet Place 1 tablet (0.4 mg total) under the tongue every 5 (five) minutes as needed for chest pain (up to 3 doses). Do not take this medication within 24 hours of taking a Viagra tablet.  25 tablet  4  . prasugrel (EFFIENT) 10 MG TABS Take 1 tablet (10 mg total) by mouth daily.  90 tablet  2  . sildenafil (VIAGRA) 50 MG tablet Take 1 tablet (50 mg total) by mouth daily as needed. For erectile dysfunction. Do not take this medication within 24 hours of taking a nitroglycerin tablet or nitroglycerin-containing medications.      . traMADol (ULTRAM) 50 MG tablet One half to one tablet 3 times daily when necessary for pain  100 tablet  3   No current facility-administered medications on file prior to visit.    Allergies  Allergen Reactions  . Hydralazine     Cramps, nightmares  . Prednisone     REACTION: swelling, SOB    Past Medical History  Diagnosis Date  . Allergic rhinitis   . GERD (gastroesophageal reflux disease)   .  Hyperlipidemia   . Hypertension   . Osteoarthritis   . ED (erectile dysfunction)   . Carotid bruit     r  . Renal insufficiency   . Asthma   . Obesity   . CAD (coronary artery disease)     a. s/p PTCA/stenting of the mid LAD and mid RCA 04/06/12  . Aortic stenosis     a. Mild by echo 03/24/12.  Marland Kitchen Anemia     Past Surgical History  Procedure Laterality Date  . Nm pet lymphoma      left side of neck 1999    History  Smoking status  . Former Smoker  Smokeless tobacco  . Never Used    History  Alcohol Use No    Family History  Problem Relation Age of Onset  . Hypertension    . Stroke    . Coronary artery disease Father     in his 49s  . Heart disease Father   . Heart attack Father     Review of Systems: The review of systems is per the HPI.  All other systems were reviewed and are negative.  Physical Exam: BP 162/68  Pulse  53  Ht 5\' 7"  (1.702 m)  Wt 206 lb 6.4 oz (93.622 kg)  BMI 32.32 kg/m2  SpO2 98% Patient is very pleasant and in no acute distress. He has clearly lost a significant amount of weight. Skin is warm and dry. Color is normal.  HEENT is unremarkable. Normocephalic/atraumatic. PERRL. Sclera are nonicteric. Neck is supple. No masses. No JVD. Lungs are clear. Cardiac exam shows a regular rate and rhythm. Abdomen is soft. Extremities are without edema. Gait and ROM are intact. No gross neurologic deficits noted.  LABORATORY DATA: BMET and CBC pending Lab Results  Component Value Date   WBC 6.5 11/03/2012   HGB 11.0* 11/03/2012   HCT 32.0* 11/03/2012   PLT 211 11/03/2012   GLUCOSE 103* 11/03/2012   CHOL 220* 12/10/2010   TRIG 74.0 12/10/2010   HDL 56.60 12/10/2010   LDLDIRECT 164.9 12/10/2010   ALT 19 11/03/2012   AST 23 11/03/2012   NA 135* 11/03/2012   K 4.4 11/03/2012   CL 100 11/03/2012   CREATININE 1.1 11/03/2012   BUN 14.5 11/03/2012   CO2 27 11/03/2012   TSH 0.86 12/10/2010   PSA 0.90 12/10/2010   INR 1.0 04/03/2012     Assessment / Plan:  1. CAD - status post 2 vessel PCI with DES in November 2013- doing well. Continue current therapy with aspirin and Effient. He may stop Effient on 04/06/2013. I recommended a regular stress test in November since symptoms were not a reliable indicator prior to his intervention.  2. HTN - blood pressure control is suboptimal. I recommended increasing lisinopril to 20 mg per day.  3. Anemia   4. CKD - improved

## 2013-01-26 ENCOUNTER — Telehealth: Payer: Self-pay

## 2013-01-26 ENCOUNTER — Other Ambulatory Visit: Payer: Self-pay

## 2013-01-26 MED ORDER — LISINOPRIL 20 MG PO TABS: 20.0000 mg | ORAL_TABLET | Freq: Every day | ORAL | Status: DC

## 2013-01-26 NOTE — Telephone Encounter (Signed)
pt mail orderd pharm called to verify pt lisinopril dosage. verified with pharmacist bethanny. Rx for should read lisinopril 20 mg tab daily.

## 2013-01-27 ENCOUNTER — Telehealth: Payer: Self-pay | Admitting: Family Medicine

## 2013-01-27 MED ORDER — ESOMEPRAZOLE MAGNESIUM 40 MG PO CPDR: DELAYED_RELEASE_CAPSULE | ORAL | Status: DC

## 2013-01-27 NOTE — Telephone Encounter (Signed)
PT is calling to request a year RX of his NEXIUM 40 MG capsule. [facility] Please assist.

## 2013-02-25 ENCOUNTER — Other Ambulatory Visit (INDEPENDENT_AMBULATORY_CARE_PROVIDER_SITE_OTHER): Payer: BC Managed Care – PPO

## 2013-02-25 DIAGNOSIS — Z0000 Encounter for general adult medical examination without abnormal findings: Secondary | ICD-10-CM

## 2013-02-25 LAB — HEPATIC FUNCTION PANEL
ALT: 18 U/L (ref 0–53)
AST: 21 U/L (ref 0–37)
Alkaline Phosphatase: 51 U/L (ref 39–117)
Bilirubin, Direct: 0.1 mg/dL (ref 0.0–0.3)
Total Bilirubin: 0.7 mg/dL (ref 0.3–1.2)

## 2013-02-25 LAB — BASIC METABOLIC PANEL
BUN: 15 mg/dL (ref 6–23)
Calcium: 9.4 mg/dL (ref 8.4–10.5)
GFR: 66.6 mL/min (ref >60.00)
Glucose, Bld: 91 mg/dL (ref 70–99)
Potassium: 4.9 mEq/L (ref 3.5–5.1)
Sodium: 137 mEq/L (ref 135–145)

## 2013-02-25 LAB — CBC WITH DIFFERENTIAL/PLATELET
Basophils Absolute: 0.1 10*3/uL (ref 0.0–0.1)
Eosinophils Relative: 4.4 % (ref 0.0–5.0)
HCT: 34.3 % — ABNORMAL LOW (ref 39.0–52.0)
Lymphocytes Relative: 39.5 % (ref 12.0–46.0)
Lymphs Abs: 2.4 10*3/uL (ref 0.7–4.0)
Monocytes Relative: 9.2 % (ref 3.0–12.0)
Platelets: 231 10*3/uL (ref 150.0–400.0)
RDW: 13.4 % (ref 11.5–14.6)
WBC: 6.1 10*3/uL (ref 4.5–10.5)

## 2013-02-25 LAB — POCT URINALYSIS DIPSTICK
Bilirubin, UA: NEGATIVE
Glucose, UA: NEGATIVE
Ketones, UA: NEGATIVE
Leukocytes, UA: NEGATIVE
Nitrite, UA: NEGATIVE
Protein, UA: NEGATIVE
pH, UA: 6

## 2013-03-03 ENCOUNTER — Telehealth: Payer: Self-pay | Admitting: Oncology

## 2013-03-03 NOTE — Telephone Encounter (Signed)
pt cancelled appt for 10/3, do not want to r/s appt

## 2013-03-04 ENCOUNTER — Encounter: Payer: Self-pay | Admitting: Family Medicine

## 2013-03-04 ENCOUNTER — Ambulatory Visit (INDEPENDENT_AMBULATORY_CARE_PROVIDER_SITE_OTHER): Payer: BC Managed Care – PPO | Admitting: Family Medicine

## 2013-03-04 VITALS — BP 130/70 | Temp 98.0°F | Ht 65.5 in | Wt 208.0 lb

## 2013-03-04 DIAGNOSIS — I2510 Atherosclerotic heart disease of native coronary artery without angina pectoris: Secondary | ICD-10-CM

## 2013-03-04 DIAGNOSIS — E785 Hyperlipidemia, unspecified: Secondary | ICD-10-CM

## 2013-03-04 DIAGNOSIS — I10 Essential (primary) hypertension: Secondary | ICD-10-CM

## 2013-03-04 DIAGNOSIS — K219 Gastro-esophageal reflux disease without esophagitis: Secondary | ICD-10-CM

## 2013-03-04 DIAGNOSIS — G2581 Restless legs syndrome: Secondary | ICD-10-CM

## 2013-03-04 DIAGNOSIS — J309 Allergic rhinitis, unspecified: Secondary | ICD-10-CM

## 2013-03-04 DIAGNOSIS — D631 Anemia in chronic kidney disease: Secondary | ICD-10-CM

## 2013-03-04 DIAGNOSIS — R0789 Other chest pain: Secondary | ICD-10-CM

## 2013-03-04 DIAGNOSIS — M1990 Unspecified osteoarthritis, unspecified site: Secondary | ICD-10-CM

## 2013-03-04 DIAGNOSIS — N529 Male erectile dysfunction, unspecified: Secondary | ICD-10-CM

## 2013-03-04 DIAGNOSIS — Z23 Encounter for immunization: Secondary | ICD-10-CM

## 2013-03-04 DIAGNOSIS — R0989 Other specified symptoms and signs involving the circulatory and respiratory systems: Secondary | ICD-10-CM

## 2013-03-04 MED ORDER — AMLODIPINE BESYLATE 10 MG PO TABS: 10.0000 mg | ORAL_TABLET | Freq: Every day | ORAL | Status: DC

## 2013-03-04 MED ORDER — LISINOPRIL 20 MG PO TABS: 20.0000 mg | ORAL_TABLET | Freq: Two times a day (BID) | ORAL | Status: DC

## 2013-03-04 MED ORDER — SILDENAFIL CITRATE 50 MG PO TABS: 50.0000 mg | ORAL_TABLET | Freq: Every day | ORAL | Status: DC | PRN

## 2013-03-04 MED ORDER — NITROGLYCERIN 0.4 MG SL SUBL: 0.4000 mg | SUBLINGUAL_TABLET | SUBLINGUAL | Status: DC | PRN

## 2013-03-04 MED ORDER — TRAMADOL HCL 50 MG PO TABS: ORAL_TABLET | ORAL | Status: DC

## 2013-03-04 MED ORDER — METOPROLOL SUCCINATE ER 50 MG PO TB24: 50.0000 mg | ORAL_TABLET | Freq: Every day | ORAL | Status: DC

## 2013-03-04 MED ORDER — MOMETASONE FUROATE 50 MCG/ACT NA SUSP: 2.0000 | NASAL | Status: DC | PRN

## 2013-03-04 MED ORDER — ESOMEPRAZOLE MAGNESIUM 40 MG PO CPDR: DELAYED_RELEASE_CAPSULE | ORAL | Status: DC

## 2013-03-04 NOTE — Progress Notes (Signed)
Subjective:    Patient ID: Todd Spears, male    DOB: 08-Jan-1946, 66 y.o.   MRN: [id number]  HPI Todd Spears is a 67 year old married male nonsmoker who comes in today for a Medicare wellness examination  He had 2 cardiac stents last winter put in by Dr. Swaziland. Since that time he's lost 81 pounds and is walking 60 minutes daily. He states he feels better.  He takes no mass 10 mg daily and lisinopril 20 mg twice a day for hypertension. He's on medication for to prevent gout because he is intolerant of allopurinol.  He uses Nasonex for allergic rhinitis  Refill nitroglycerin although he's not had any chest pain. He's currently on and on Coumadin blood thinner.  Uses Viagra 50 mg when necessary for ED and tramadol 50 mg when necessary for pain.  He gets routine eye care, dental care, colonoscopy and GI, vaccinations updated he'll be given a Pneumovax tetanus and flu shot today  Cognitive function normal he walks 60 minutes daily home health safety reviewed no issues identified, no guns in the house, he does have a health care power of attorney and living well   Review of Systems  Constitutional: Negative.   HENT: Negative.   Eyes: Negative.   Respiratory: Negative.   Cardiovascular: Negative.   Gastrointestinal: Negative.   Endocrine: Negative.   Genitourinary: Negative.   Musculoskeletal: Negative.   Skin: Negative.   Allergic/Immunologic: Negative.   Neurological: Negative.   Hematological: Negative.   Psychiatric/Behavioral: Negative.        Objective:   Physical Exam  Constitutional: He is oriented to person, place, and time. He appears well-developed and well-nourished.  HENT:  Head: Normocephalic and atraumatic.  Right Ear: External ear normal.  Left Ear: External ear normal.  Nose: Nose normal.  Mouth/Throat: Oropharynx is clear and moist.  Eyes: Conjunctivae and EOM are normal. Pupils are equal, round, and reactive to light.  Neck: Normal range of motion. Neck  supple. No JVD present. No tracheal deviation present. No thyromegaly present.  Cardiovascular: Normal rate, regular rhythm, normal heart sounds and intact distal pulses.  Exam reveals no gallop and no friction rub.   No murmur heard. Soft right carotid bruit last vascular study was a couple years ago due for followup although the bruit  has not changed  Aorta normal peripheral pulses 2+ and symmetrical  Pulmonary/Chest: Effort normal and breath sounds normal. No stridor. No respiratory distress. He has no wheezes. He has no rales. He exhibits no tenderness.  Abdominal: Soft. Bowel sounds are normal. He exhibits no distension and no mass. There is no tenderness. There is no rebound and no guarding.  Genitourinary: Rectum normal, prostate normal and penis normal. Guaiac negative stool. No penile tenderness.  Musculoskeletal: Normal range of motion. He exhibits no edema and no tenderness.  Lymphadenopathy:    He has no cervical adenopathy.  Neurological: He is alert and oriented to person, place, and time. He has normal reflexes. No cranial nerve deficit. He exhibits normal muscle tone.  Skin: Skin is warm and dry. No rash noted. No erythema. No pallor.  Total body skin exam normal  Psychiatric: He has a normal mood and affect. His behavior is normal. Judgment and thought content normal.          Assessment & Plan:  Hypertension adult continue current therapy  Reflux esophagitis continue Nexium 40 daily  Allergic rhinitis continue OTC Claritin and steroid nasal spray  History of gout followup by [facility]  Status post stents x2 followed by Dr. Swaziland  Erectile dysfunction refill Viagra  Occasional pain of osteoarthritis tramadol when necessary

## 2013-03-04 NOTE — Patient Instructions (Signed)
Continue your good health habits diet and exercise  Return in one year for general physical examination sooner if any problems

## 2013-03-05 ENCOUNTER — Other Ambulatory Visit: Payer: BC Managed Care – PPO | Admitting: Lab

## 2013-03-05 ENCOUNTER — Ambulatory Visit: Payer: BC Managed Care – PPO | Admitting: Oncology

## 2013-03-30 ENCOUNTER — Telehealth: Payer: Self-pay

## 2013-03-30 NOTE — Telephone Encounter (Signed)
patient called wanting a 15 tab refill on his effient 10mg  hell will stop taking this on November 4th I suggest for him to pick up samples inseat of getting a 10 day refill

## 2013-04-13 ENCOUNTER — Ambulatory Visit (INDEPENDENT_AMBULATORY_CARE_PROVIDER_SITE_OTHER): Payer: BC Managed Care – PPO | Admitting: Nurse Practitioner

## 2013-04-13 VITALS — BP 186/80 | HR 61

## 2013-04-13 DIAGNOSIS — E785 Hyperlipidemia, unspecified: Secondary | ICD-10-CM

## 2013-04-13 DIAGNOSIS — I10 Essential (primary) hypertension: Secondary | ICD-10-CM

## 2013-04-13 DIAGNOSIS — I2510 Atherosclerotic heart disease of native coronary artery without angina pectoris: Secondary | ICD-10-CM

## 2013-04-13 NOTE — Progress Notes (Signed)
Exercise Treadmill Test  Pre-Exercise Testing Evaluation Rhythm: sinus bradycardia  Rate: 56     Test  Exercise Tolerance Test Ordering MD: Peter Swaziland, MD  Interpreting MD: Shawn Route NP  Unique Test No: 1  Treadmill:  1  Indication for ETT: known ASHD  Contraindication to ETT: No   Stress Modality: exercise - treadmill  Cardiac Imaging Performed: non   Protocol: standard Bruce - maximal  Max BP:  201/81  Max MPHR (bpm):  153 85% MPR (bpm):  130  MPHR obtained (bpm):  123 % MPHR obtained:  83%  Reached 85% MPHR (min:sec):  NA Total Exercise Time (min-sec):  13  Workload in METS:  7.3 Borg Scale: 13  Reason ETT Terminated:  ST depression (>76mm)    ST Segment Analysis At Rest: normal ST segments - no evidence of significant ST depression With Exercise: significant ischemic ST depression  Other Information Arrhythmia:  No Angina during ETT:  absent (0) Quality of ETT:  non-diagnostic  ETT Interpretation:  abnormal - evidence of ST depression consistent with ischemia  Comments: Patient presents today for routine GXT. Has known CAD with 2 vessel PCI one year ago and had had unreliable symptoms at the time of his initial presentation (abnormal GXT with poor exercise tolerance and EKG changes).    Today the patient exercised on the standard Bruce protocol for a total of 6:15 minutes.  Improved exercise tolerance.  Hypertensive blood pressure response.  Clinically negative for chest pain. Test was stopped due to EKG changes.  EKG positive for ischemia - took 8 minutes to recover. No significant arrhythmia noted.   Recommendations: Patient prefers to proceed on with Myoview testing. I have reviewed his tracings with Dr. Ladona Ridgel. Will proceed on with stress Myoview. May end up needing to be recathed. Last renal function was normal.   Further disposition to follow.  Patient is agreeable to this plan and will call if any problems develop in the interim.   Rosalio Macadamia, RN,  ANP-C [facility] [address] [place], [place]  [address]

## 2013-04-21 ENCOUNTER — Encounter: Payer: Self-pay | Admitting: Internal Medicine

## 2013-04-21 ENCOUNTER — Ambulatory Visit (HOSPITAL_COMMUNITY): Payer: BC Managed Care – PPO | Attending: Cardiology | Admitting: Radiology

## 2013-04-21 VITALS — BP 168/59 | Ht 67.0 in | Wt 205.0 lb

## 2013-04-21 DIAGNOSIS — R0609 Other forms of dyspnea: Secondary | ICD-10-CM | POA: Insufficient documentation

## 2013-04-21 DIAGNOSIS — I2510 Atherosclerotic heart disease of native coronary artery without angina pectoris: Secondary | ICD-10-CM

## 2013-04-21 DIAGNOSIS — R0602 Shortness of breath: Secondary | ICD-10-CM

## 2013-04-21 DIAGNOSIS — Z8249 Family history of ischemic heart disease and other diseases of the circulatory system: Secondary | ICD-10-CM | POA: Insufficient documentation

## 2013-04-21 DIAGNOSIS — Z87891 Personal history of nicotine dependence: Secondary | ICD-10-CM | POA: Insufficient documentation

## 2013-04-21 DIAGNOSIS — R9439 Abnormal result of other cardiovascular function study: Secondary | ICD-10-CM | POA: Insufficient documentation

## 2013-04-21 DIAGNOSIS — R0989 Other specified symptoms and signs involving the circulatory and respiratory systems: Secondary | ICD-10-CM | POA: Insufficient documentation

## 2013-04-21 DIAGNOSIS — I10 Essential (primary) hypertension: Secondary | ICD-10-CM

## 2013-04-21 DIAGNOSIS — E785 Hyperlipidemia, unspecified: Secondary | ICD-10-CM

## 2013-04-21 MED ORDER — TECHNETIUM TC 99M SESTAMIBI GENERIC - CARDIOLITE
10.0000 | Freq: Once | INTRAVENOUS | Status: AC | PRN
  Administered 2013-04-21: 10 via INTRAVENOUS

## 2013-04-21 MED ORDER — TECHNETIUM TC 99M SESTAMIBI GENERIC - CARDIOLITE
30.0000 | Freq: Once | INTRAVENOUS | Status: AC | PRN
  Administered 2013-04-21: 30 via INTRAVENOUS

## 2013-04-21 NOTE — Progress Notes (Signed)
[facility] SITE 3 NUCLEAR MED [address] [place], [place] [address] [phone]    Cardiology Nuclear Med Study  Todd Spears is a 67 y.o. male     MRN : [id number]     DOB: [birth year]  Procedure Date: 04/21/2013  Nuclear Med Background Indication for Stress Test:  Evaluation for Ischemia and 04-13-13:Abnormal GXT: Significant Ischemic ST depression with exercise History:  CAD-Stents,Echo -EF 55-65% Cardiac Risk Factors: Family History - CAD, History of Smoking, Hypertension and Lipids  Symptoms:  DOE   Nuclear Pre-Procedure Caffeine/Decaff Intake:  None > 12 hrs NPO After: 7:00pm   Lungs:  clear O2 Sat: 97% on room air. IV 0.9% NS with Angio Cath:  22g  IV Site: R Wrist x 1, tolerated well IV Started by:  Irean Hong, RN  Chest Size (in):  48 Cup Size: n/a  Height: 5\' 7"  (1.702 m)  Weight:  205 lb (92.987 kg)  BMI:  Body mass index is 32.1 kg/(m^2). Tech Comments:  HeldTtoprol x 48 hrs    Nuclear Med Study 1 or 2 day study: 1 day  Stress Test Type:  Stress  Reading MD: Kristeen Miss, MD  Order Authorizing Provider:  Peter Swaziland, MD, and Norma Fredrickson, NP  Resting Radionuclide: Technetium 39m Sestamibi  Resting Radionuclide Dose: 11.0 mCi   Stress Radionuclide:  Technetium 29m Sestamibi  Stress Radionuclide Dose: 33.0 mCi           Stress Protocol Rest HR: 64 Stress HR: 151  Rest BP: 168/59 Stress BP: 218/82  Exercise Time (min): 6:30 METS: 7.7   Predicted Max HR: 153 bpm % Max HR: 98.69 bpm Rate Pressure Product: 21308   Dose of Adenosine (mg):  n/a Dose of Lexiscan: n/a mg  Dose of Atropine (mg): n/a Dose of Dobutamine: n/a mcg/kg/min (at max HR)  Stress Test Technologist: Bonnita Levan, RN  Nuclear Technologist:  Harlow Asa, CNMT     Rest Procedure:  Myocardial perfusion imaging was performed at rest 45 minutes following the intravenous administration of Technetium 81m Sestamibi. Rest ECG: NSR - Normal EKG  Stress Procedure:  The  patient exercised on the treadmill utilizing the Bruce Protocol for 6:30 minutes. The patient stopped due to dyspnea,fatigue and denied any chest pain.  Technetium 31m Sestamibi was injected at peak exercise and myocardial perfusion imaging was performed after a brief delay. Stress ECG: There were mild ST depressions that resolved slowly in the recovery phase.    QPS Raw Data Images:  Normal; no motion artifact; normal heart/lung ratio. Stress Images:  There is a small moderately severe defect in the inferior basal segments. Rest Images:  There is a small moderately severe defect in the inferior basal segments. Subtraction (SDS):  No evidence of ischemia. Transient Ischemic Dilatation (Normal <1.22):  1.10 Lung/Heart Ratio (Normal <0.45):  0.28  Quantitative Gated Spect Images QGS EDV:  122 ml QGS ESV:  53 ml  Impression Exercise Capacity:  Fair exercise capacity. BP Response:  Normal blood pressure response. Clinical Symptoms:  No significant symptoms noted. ECG Impression:  Mild ST depression that resolved slowly in the recovery phase.  Comparison with Prior Nuclear Study: No images to compare  Overall Impression:  Low risk stress nuclear study   He has a hx of PCI of the LAD and RCA.  He has evidence of a small subendocardial MI in the inferior basal segments.  .  LV Ejection Fraction: 57%.  LV Wall Motion:  There is mild hypokinesis  of the inferior basal segments.  The overall LV function is normal.    Vesta Mixer, Montez Hageman., MD, [facility] 04/21/2013, 5:44 PM Office - [phone] Pager [phone]

## 2013-04-26 ENCOUNTER — Telehealth: Payer: Self-pay | Admitting: Cardiology

## 2013-04-26 NOTE — Telephone Encounter (Signed)
follw up     Pt calling you back for his result of test.

## 2013-04-26 NOTE — Telephone Encounter (Signed)
Returned call to patient no answer.LMTC. 

## 2013-04-26 NOTE — Telephone Encounter (Signed)
Returned call to patient myoview results given. 

## 2013-04-26 NOTE — Telephone Encounter (Signed)
Follow Up: ° ° ° °Pt returning call for results.  °

## 2013-07-06 ENCOUNTER — Ambulatory Visit: Payer: BC Managed Care – PPO | Admitting: Cardiology

## 2013-07-26 ENCOUNTER — Other Ambulatory Visit: Payer: Self-pay | Admitting: Family Medicine

## 2013-08-05 ENCOUNTER — Telehealth: Payer: Self-pay | Admitting: *Deleted

## 2013-08-05 MED ORDER — HYDROCODONE-HOMATROPINE 5-1.5 MG/5ML PO SYRP: ORAL_SOLUTION | ORAL | Status: DC

## 2013-08-05 NOTE — Telephone Encounter (Signed)
Rx ready for pick up and Left message on machine for patient   

## 2013-08-05 NOTE — Telephone Encounter (Signed)
Patient calling with a cough would like a Rx.  Per Dr Sherren Mocha - Hydromet

## 2013-08-13 ENCOUNTER — Ambulatory Visit (INDEPENDENT_AMBULATORY_CARE_PROVIDER_SITE_OTHER): Payer: BC Managed Care – PPO | Admitting: Family Medicine

## 2013-08-13 ENCOUNTER — Encounter: Payer: Self-pay | Admitting: Family Medicine

## 2013-08-13 VITALS — BP 148/64 | HR 60 | Temp 98.3°F | Ht 67.0 in | Wt 210.0 lb

## 2013-08-13 DIAGNOSIS — J209 Acute bronchitis, unspecified: Secondary | ICD-10-CM

## 2013-08-13 MED ORDER — AMOXICILLIN-POT CLAVULANATE 875-125 MG PO TABS: 1.0000 | ORAL_TABLET | Freq: Two times a day (BID) | ORAL | Status: DC

## 2013-08-13 NOTE — Progress Notes (Signed)
Pre visit review using our clinic review tool, if applicable. No additional management support is needed unless otherwise documented below in the visit note. 

## 2013-08-13 NOTE — Progress Notes (Signed)
   Subjective:    Patient ID: Todd Spears, male    DOB: 02-16-1946, 68 y.o.   MRN: [id number]  HPI Here for 6 weeks of PND, chest tightness and coughing up green sputum. No fever. He went to Urgent Care and was given an antibiotic (Levaquin?) which did not help.    Review of Systems  Constitutional: Negative.   HENT: Positive for congestion and postnasal drip. Negative for sinus pressure.   Eyes: Negative.   Respiratory: Positive for cough.        Objective:   Physical Exam  Constitutional: He appears well-developed and well-nourished.  HENT:  Right Ear: External ear normal.  Left Ear: External ear normal.  Nose: Nose normal.  Mouth/Throat: Oropharynx is clear and moist.  Eyes: Conjunctivae are normal.  Neck: Neck supple.  Pulmonary/Chest: Effort normal and breath sounds normal.  Lymphadenopathy:    He has no cervical adenopathy.          Assessment & Plan:  Try Augmentin and add Mucinex bid

## 2013-08-20 ENCOUNTER — Ambulatory Visit (INDEPENDENT_AMBULATORY_CARE_PROVIDER_SITE_OTHER): Payer: BC Managed Care – PPO | Admitting: Cardiology

## 2013-08-20 ENCOUNTER — Encounter: Payer: Self-pay | Admitting: Cardiology

## 2013-08-20 VITALS — BP 158/72 | HR 55 | Ht 67.0 in | Wt 212.8 lb

## 2013-08-20 DIAGNOSIS — I2510 Atherosclerotic heart disease of native coronary artery without angina pectoris: Secondary | ICD-10-CM

## 2013-08-20 DIAGNOSIS — E785 Hyperlipidemia, unspecified: Secondary | ICD-10-CM

## 2013-08-20 DIAGNOSIS — I10 Essential (primary) hypertension: Secondary | ICD-10-CM

## 2013-08-20 NOTE — Patient Instructions (Addendum)
Continue your current therapy  We will try low dose Crestor 5 mg three days a week. If you develop cramps let me know.   Work on your lifestyle changes with aerobic exercise and healthy diet.  Keep an eye on your blood pressure..  I will see you in 6 months.

## 2013-08-21 NOTE — Progress Notes (Signed)
Todd Spears Date of Birth: January 12, 1946 Medical Record #[id number]  History of Present Illness: Todd Spears is seen back today for a followup visit. He has had an abnormal GXT and underwent cardiac cath with subsequent 2 vessel PCI with Promus DES for 90% mid LAD and 95% mid RCA disease on 04/06/2012. His EF is normal at 55%. At that time he really had no significant anginal symptoms. Other issues include CKD, HTN, obesity, gout,,OA, ED, carotid bruit and asthma. On followup today he continues to do well from a cardiac standpoint. No chest pain or SOB. He has been dealing with a sinus infection since February. He does have a history of severe cramps on lipitor in the past.   Current Outpatient Prescriptions on File Prior to Visit  Medication Sig Dispense Refill  . albuterol (PROVENTIL HFA;VENTOLIN HFA) 108 (90 BASE) MCG/ACT inhaler Inhale 2 puffs into the lungs every 6 (six) hours as needed. For shortness of breath  2 Inhaler  1  . amLODipine (NORVASC) 10 MG tablet Take 1 tablet (10 mg total) by mouth daily.  100 tablet  3  . amoxicillin-clavulanate (AUGMENTIN) 875-125 MG per tablet Take 1 tablet by mouth 2 (two) times daily.  20 tablet  0  . aspirin 81 MG chewable tablet Chew 81 mg by mouth daily.      Marland Kitchen COLCRYS 0.6 MG tablet Take 0.6 mg by mouth daily.       . Cyanocobalamin (VITAMIN B 12 PO) Take 1 tablet by mouth daily.       Marland Kitchen esomeprazole (NEXIUM) 40 MG capsule TAKE 1 CAPSULE DAILY BEFORE BREAKFAST  100 capsule  3  . Febuxostat (ULORIC PO) Take by mouth.      . FeFum-FePoly-FA-B Cmp-C-Biot (INTEGRA PLUS PO) Take 1 tablet by mouth daily.      . halobetasol (ULTRAVATE) 0.05 % cream Apply topically 2 (two) times daily.  50 g  4  . HYDROcodone-homatropine (HYCODAN) 5-1.5 MG/5ML syrup Use half to one teaspoon at bedtime as needed for cough  240 mL  0  . lisinopril (PRINIVIL,ZESTRIL) 20 MG tablet Take 1 tablet (20 mg total) by mouth 2 (two) times daily.  200 tablet  3  . loratadine (CLARITIN) 10 MG  tablet Take 10 mg by mouth daily.      . metaxalone (SKELAXIN) 800 MG tablet TAKE ONE-HALF TO ONE TABLET BY MOUTH AT BEDTIME  60 tablet  0  . metoprolol succinate (TOPROL-XL) 50 MG 24 hr tablet Take 1 tablet (50 mg total) by mouth daily. Take with or immediately following a meal.  100 tablet  3  . mometasone (NASONEX) 50 MCG/ACT nasal spray Place 2 sprays into the nose as needed.  17 g  11  . Multiple Vitamins-Minerals (VISION-VITE PRESERVE PO) Take 1 tablet by mouth daily.      . nitroGLYCERIN (NITROSTAT) 0.4 MG SL tablet Place 1 tablet (0.4 mg total) under the tongue every 5 (five) minutes as needed for chest pain (up to 3 doses). Do not take this medication within 24 hours of taking a Viagra tablet.  25 tablet  4  . sildenafil (VIAGRA) 50 MG tablet Take 1 tablet (50 mg total) by mouth daily as needed. For erectile dysfunction. Do not take this medication within 24 hours of taking a nitroglycerin tablet or nitroglycerin-containing medications.  10 tablet  6  . traMADol (ULTRAM) 50 MG tablet One half to one tablet 3 times daily when necessary for pain  100 tablet  3  No current facility-administered medications on file prior to visit.    Allergies  Allergen Reactions  . Hydralazine     Cramps, nightmares  . Prednisone     REACTION: swelling, SOB    Past Medical History  Diagnosis Date  . Allergic rhinitis   . GERD (gastroesophageal reflux disease)   . Hyperlipidemia   . Hypertension   . Osteoarthritis   . ED (erectile dysfunction)   . Carotid bruit     r  . Renal insufficiency   . Asthma   . Obesity   . CAD (coronary artery disease)     a. s/p PTCA/stenting of the mid LAD and mid RCA 04/06/12  . Aortic stenosis     a. Mild by echo 03/24/12.  Marland Kitchen Anemia     Past Surgical History  Procedure Laterality Date  . Nm pet lymphoma      left side of neck 1999  . Cardiac stents Bilateral     History  Smoking status  . Former Smoker  Smokeless tobacco  . Never Used     History  Alcohol Use No    Family History  Problem Relation Age of Onset  . Hypertension    . Stroke    . Coronary artery disease Father     in his 44s  . Heart disease Father   . Heart attack Father     Review of Systems: The review of systems is per the HPI.  All other systems were reviewed and are negative.  Physical Exam: BP 158/72  Pulse 55  Ht 5\' 7"  (1.702 m)  Wt 212 lb 12.8 oz (96.525 kg)  BMI 33.32 kg/m2 Patient is very pleasant and in no acute distress. Skin is warm and dry. Color is normal.  HEENT is unremarkable. Normocephalic/atraumatic. PERRL. Sclera are nonicteric. Neck is supple. No masses. No JVD. Lungs are clear. Cardiac exam shows a regular rate and rhythm. Abdomen is soft. Extremities are without edema. Gait and ROM are intact. No gross neurologic deficits noted.  LABORATORY DATA: Lab Results  Component Value Date   WBC 6.1 02/25/2013   HGB 11.6* 02/25/2013   HCT 34.3* 02/25/2013   PLT 231.0 02/25/2013   GLUCOSE 91 02/25/2013   CHOL 194 02/25/2013   TRIG 63.0 02/25/2013   HDL 58.70 02/25/2013   LDLDIRECT 164.9 12/10/2010   LDLCALC 123* 02/25/2013   ALT 18 02/25/2013   AST 21 02/25/2013   NA 137 02/25/2013   K 4.9 02/25/2013   CL 100 02/25/2013   CREATININE 1.2 02/25/2013   BUN 15 02/25/2013   CO2 30 02/25/2013   TSH 2.31 02/25/2013   PSA 0.59 02/25/2013   INR 1.0 04/03/2012   Ecg: NSR rate 55 bpm, normal.  Cardiology Nuclear Med Study  Todd Spears is a 68 y.o. male MRN : [id number] DOB: [birth year]  Procedure Date: 04/21/2013  Nuclear Med Background  Indication for Stress Test: Evaluation for Ischemia and 04-13-13:Abnormal GXT: Significant Ischemic ST depression with exercise  History: CAD-Stents,Echo -EF 55-65%  Cardiac Risk Factors: Family History - CAD, History of Smoking, Hypertension and Lipids  Symptoms: DOE  Nuclear Pre-Procedure  Caffeine/Decaff Intake: None > 12 hrs  NPO After: 7:00pm   Lungs: clear  O2 Sat: 97% on room air.  IV 0.9% NS with  Angio Cath: 22g   IV Site: R Wrist x 1, tolerated well  IV Started by: Irven Baltimore, RN   Chest Size (in): 48  Cup Size: n/a   Height:  5\' 7"  (1.702 m)  Weight: 205 lb (92.987 kg)   BMI: Body mass index is 32.1 kg/(m^2).  Tech Comments: HeldTtoprol x 48 hrs   Nuclear Med Study  1 or 2 day study: 1 day  Stress Test Type: Stress   Reading MD: Mertie Moores, MD  Order Authorizing Provider: Peter Martinique, MD, and Truitt Merle, NP   Resting Radionuclide: Technetium 94m Sestamibi  Resting Radionuclide Dose: 11.0 mCi   Stress Radionuclide: Technetium 18m Sestamibi  Stress Radionuclide Dose: 33.0 mCi   Stress Protocol  Rest HR: 64  Stress HR: 151   Rest BP: 168/59  Stress BP: 218/82   Exercise Time (min): 6:30  METS: 7.7   Predicted Max HR: 153 bpm  % Max HR: 98.69 bpm  Rate Pressure Product: 32918  Dose of Adenosine (mg): n/a  Dose of Lexiscan: n/a mg   Dose of Atropine (mg): n/a  Dose of Dobutamine: n/a mcg/kg/min (at max HR)   Stress Test Technologist: Crissie Figures, RN  Nuclear Technologist: Vedia Pereyra, CNMT   Rest Procedure: Myocardial perfusion imaging was performed at rest 45 minutes following the intravenous administration of Technetium 33m Sestamibi.  Rest ECG: NSR - Normal EKG  Stress Procedure: The patient exercised on the treadmill utilizing the Bruce Protocol for 6:30 minutes. The patient stopped due to dyspnea,fatigue and denied any chest pain. Technetium 52m Sestamibi was injected at peak exercise and myocardial perfusion imaging was performed after a brief delay.  Stress ECG: There were mild ST depressions that resolved slowly in the recovery phase.  QPS  Raw Data Images: Normal; no motion artifact; normal heart/lung ratio.  Stress Images: There is a small moderately severe defect in the inferior basal segments.  Rest Images: There is a small moderately severe defect in the inferior basal segments.  Subtraction (SDS): No evidence of ischemia.  Transient Ischemic Dilatation  (Normal <1.22): 1.10  Lung/Heart Ratio (Normal <0.45): 0.28  Quantitative Gated Spect Images  QGS EDV: 122 ml  QGS ESV: 53 ml  Impression  Exercise Capacity: Fair exercise capacity.  BP Response: Normal blood pressure response.  Clinical Symptoms: No significant symptoms noted.  ECG Impression: Mild ST depression that resolved slowly in the recovery phase.  Comparison with Prior Nuclear Study: No images to compare  Overall Impression: Low risk stress nuclear study He has a hx of PCI of the LAD and RCA. He has evidence of a small subendocardial MI in the inferior basal segments. .  LV Ejection Fraction: 57%. LV Wall Motion: There is mild hypokinesis of the inferior basal segments. The overall LV function is normal.  Thayer Headings, Brooke Bonito., MD, [facility]  04/21/2013, 5:44 PM  Office - [phone]  Pager [phone]  Assessment / Plan:  1. CAD - status post 2 vessel PCI with DES in November 2013- doing well. Continue current therapy with aspirin. Since his symptoms were not a reliable indicator of his disease we did a stress test in Nov. This showed ischemic ST changes without symptoms. Subsequent Myoview study was low risk with a small area of inferobasal scar and no ischemia. We will continue medical therapy and risk factor modification.  2. HTN - blood pressure control is suboptimal. He is on high does amlodipine and lisinopril. Unable to titrate beta blocker due to bradycardia. I asked that he monitor his BP at home and let me know if it is staying over [id number] systolic in which case we would need to add therapy ? HCTZ versus hydralazine.   3.  Anemia   4. CKD - improved  5. Hyperlipidemia. Intolerant of statins in the past. Will try very low dose Crestor 5 mg 3 days a week to see if he can tolerate. If he cannot tolerate this then will refer to lipid clinic to see if he is a candidate for one of the PCSK 9 trials.

## 2013-11-05 ENCOUNTER — Telehealth: Payer: Self-pay

## 2013-11-05 NOTE — Telephone Encounter (Signed)
Patient call to get samples of crestor placed samples up front

## 2013-11-10 ENCOUNTER — Encounter: Payer: Self-pay | Admitting: Podiatrist

## 2013-11-10 ENCOUNTER — Ambulatory Visit (INDEPENDENT_AMBULATORY_CARE_PROVIDER_SITE_OTHER): Payer: BC Managed Care – PPO | Admitting: Podiatrist

## 2013-11-10 VITALS — BP 130/64 | HR 60 | Resp 12

## 2013-11-10 DIAGNOSIS — M216X9 Other acquired deformities of unspecified foot: Secondary | ICD-10-CM

## 2013-11-10 DIAGNOSIS — Q828 Other specified congenital malformations of skin: Secondary | ICD-10-CM

## 2013-11-10 NOTE — Progress Notes (Signed)
   Subjective:    Patient ID: Todd Spears, male    DOB: 12-Oct-1945, 68 y.o.   MRN: [id number]  HPI  PT STATED HAVE CALLUS ON BOTH FEET AND THEY BEEN HURTING FOR LESS THAN 1 YEAR. THE CALLUSES ARE GETTING THICKER. THE FEET GET AGGRAVATED BY WALKING  AND PUTTING PRESSURE. TRIED TO KEEP THEM TRIM AS NEEDED.    Review of Systems  All other systems reviewed and are negative.      Objective:   Physical Exam  Patient is awake, alert, and oriented x 3.  In no acute distress.  Vascular status is intact with palpable pedal pulses at 2/4 DP and PT bilateral and capillary refill time within normal limits. Neurological sensation is also intact bilaterally via Semmes Weinstein monofilament at 5/5 sites. Light touch, vibratory sensation, Achilles tendon reflex is intact. General Dermatological exam reveals skin color, turger and texture as normal. No open lesions present.  Musculature intact with dorsiflexion, plantarflexion, inversion, eversion.   High arched foot type with prominent metatarsal heads present.  Keratotic lesion present with is intractable and has a waxy appearance present submet 5 bilateral.      Assessment & Plan:  Prominent metatarsals, porokeratotic lesion   Plan:  Debridement of lesions carried out without complication. He will call if he needs further follow up

## 2013-11-10 NOTE — Patient Instructions (Signed)

## 2013-12-02 ENCOUNTER — Telehealth: Payer: Self-pay | Admitting: Cardiology

## 2013-12-04 ENCOUNTER — Emergency Department (HOSPITAL_BASED_OUTPATIENT_CLINIC_OR_DEPARTMENT_OTHER)
Admission: EM | Admit: 2013-12-04 | Discharge: 2013-12-04 | Disposition: A | Discharge status: Home / Self Care | Payer: BC Managed Care – PPO | Attending: Emergency Medicine | Admitting: Emergency Medicine

## 2013-12-04 ENCOUNTER — Encounter (HOSPITAL_BASED_OUTPATIENT_CLINIC_OR_DEPARTMENT_OTHER): Payer: Self-pay | Admitting: Emergency Medicine

## 2013-12-04 DIAGNOSIS — K219 Gastro-esophageal reflux disease without esophagitis: Secondary | ICD-10-CM | POA: Insufficient documentation

## 2013-12-04 DIAGNOSIS — M10441 Other secondary gout, right hand: Secondary | ICD-10-CM

## 2013-12-04 DIAGNOSIS — Z79899 Other long term (current) drug therapy: Secondary | ICD-10-CM | POA: Insufficient documentation

## 2013-12-04 DIAGNOSIS — Z87448 Personal history of other diseases of urinary system: Secondary | ICD-10-CM | POA: Insufficient documentation

## 2013-12-04 DIAGNOSIS — I10 Essential (primary) hypertension: Secondary | ICD-10-CM | POA: Insufficient documentation

## 2013-12-04 DIAGNOSIS — Z7982 Long term (current) use of aspirin: Secondary | ICD-10-CM | POA: Insufficient documentation

## 2013-12-04 DIAGNOSIS — I359 Nonrheumatic aortic valve disorder, unspecified: Secondary | ICD-10-CM | POA: Insufficient documentation

## 2013-12-04 DIAGNOSIS — M109 Gout, unspecified: Secondary | ICD-10-CM | POA: Insufficient documentation

## 2013-12-04 DIAGNOSIS — I2510 Atherosclerotic heart disease of native coronary artery without angina pectoris: Secondary | ICD-10-CM | POA: Insufficient documentation

## 2013-12-04 DIAGNOSIS — E669 Obesity, unspecified: Secondary | ICD-10-CM | POA: Insufficient documentation

## 2013-12-04 DIAGNOSIS — J45909 Unspecified asthma, uncomplicated: Secondary | ICD-10-CM | POA: Insufficient documentation

## 2013-12-04 DIAGNOSIS — M1990 Unspecified osteoarthritis, unspecified site: Secondary | ICD-10-CM | POA: Insufficient documentation

## 2013-12-04 DIAGNOSIS — Z862 Personal history of diseases of the blood and blood-forming organs and certain disorders involving the immune mechanism: Secondary | ICD-10-CM | POA: Insufficient documentation

## 2013-12-04 DIAGNOSIS — N529 Male erectile dysfunction, unspecified: Secondary | ICD-10-CM | POA: Insufficient documentation

## 2013-12-04 MED ORDER — METHYLPREDNISOLONE SODIUM SUCC 125 MG IJ SOLR
125.0000 mg | Freq: Once | INTRAMUSCULAR | Status: AC
  Administered 2013-12-04: 125 mg via INTRAMUSCULAR
  Filled 2013-12-04: qty 2

## 2013-12-04 NOTE — ED Provider Notes (Signed)
CSN: [id number]     Arrival date & time 12/04/13  1443 History   First MD Initiated Contact with Patient 12/04/13 1547     Chief Complaint  Patient presents with  . Hand Pain     (Consider location/radiation/quality/duration/timing/severity/associated sxs/prior Treatment) Patient is a 68 y.o. male presenting with hand pain. The history is provided by the patient. No language interpreter was used.  Hand Pain This is a recurrent problem. The current episode started yesterday. The problem occurs constantly. The problem has been gradually worsening. Associated symptoms include joint swelling and myalgias. Pertinent negatives include no numbness. Nothing aggravates the symptoms. He has tried nothing for the symptoms. The treatment provided no relief.  Pt has gout and has to get a steroid injection when his gout flares up.  Past Medical History  Diagnosis Date  . Allergic rhinitis   . GERD (gastroesophageal reflux disease)   . Hyperlipidemia   . Hypertension   . Osteoarthritis   . ED (erectile dysfunction)   . Carotid bruit     r  . Renal insufficiency   . Asthma   . Obesity   . CAD (coronary artery disease)     a. s/p PTCA/stenting of the mid LAD and mid RCA 04/06/12  . Aortic stenosis     a. Mild by echo 03/24/12.  Marland Kitchen Anemia    Past Surgical History  Procedure Laterality Date  . Nm pet lymphoma      left side of neck 1999  . Cardiac stents Bilateral    Family History  Problem Relation Age of Onset  . Hypertension    . Stroke    . Coronary artery disease Father     in his 15s  . Heart disease Father   . Heart attack Father    History  Substance Use Topics  . Smoking status: Former Research scientist (life sciences)  . Smokeless tobacco: Never Used  . Alcohol Use: No    Review of Systems  Musculoskeletal: Positive for joint swelling and myalgias.  Neurological: Negative for numbness.  All other systems reviewed and are negative.     Allergies  Hydralazine and Prednisone  Home  Medications   Prior to Admission medications   Medication Sig Start Date End Date Taking? Authorizing Provider  albuterol (PROVENTIL HFA;VENTOLIN HFA) 108 (90 BASE) MCG/ACT inhaler Inhale 2 puffs into the lungs every 6 (six) hours as needed. For shortness of breath 09/15/12   Dorena Cookey, MD  amLODipine (NORVASC) 10 MG tablet Take 1 tablet (10 mg total) by mouth daily. 03/04/13   Dorena Cookey, MD  amoxicillin-clavulanate (AUGMENTIN) 875-125 MG per tablet Take 1 tablet by mouth 2 (two) times daily. 08/13/13   Laurey Morale, MD  aspirin 81 MG chewable tablet Chew 81 mg by mouth daily.    Historical Provider, MD  COLCRYS 0.6 MG tablet Take 0.6 mg by mouth daily.  04/09/12   Historical Provider, MD  Cyanocobalamin (VITAMIN B 12 PO) Take 1 tablet by mouth daily.     Historical Provider, MD  esomeprazole (NEXIUM) 40 MG capsule TAKE 1 CAPSULE DAILY BEFORE BREAKFAST 03/04/13   Dorena Cookey, MD  Febuxostat (ULORIC PO) Take by mouth.    Historical Provider, MD  FeFum-FePoly-FA-B Cmp-C-Biot (INTEGRA PLUS PO) Take 1 tablet by mouth daily.    Historical Provider, MD  halobetasol (ULTRAVATE) 0.05 % cream Apply topically 2 (two) times daily. 12/17/10   Dorena Cookey, MD  HYDROcodone-homatropine [facility]) 5-1.5 MG/5ML syrup Use half to one  teaspoon at bedtime as needed for cough 08/05/13   Dorena Cookey, MD  lisinopril (PRINIVIL,ZESTRIL) 20 MG tablet Take 1 tablet (20 mg total) by mouth 2 (two) times daily. 03/04/13   Dorena Cookey, MD  loratadine (CLARITIN) 10 MG tablet Take 10 mg by mouth daily.    Historical Provider, MD  metaxalone (SKELAXIN) 800 MG tablet TAKE ONE-HALF TO ONE TABLET BY MOUTH AT BEDTIME    Dorena Cookey, MD  metoprolol succinate (TOPROL-XL) 50 MG 24 hr tablet Take 1 tablet (50 mg total) by mouth daily. Take with or immediately following a meal. 03/04/13   Dorena Cookey, MD  mometasone (NASONEX) 50 MCG/ACT nasal spray Place 2 sprays into the nose as needed. 03/04/13   Dorena Cookey, MD   Multiple Vitamins-Minerals (VISION-VITE PRESERVE PO) Take 1 tablet by mouth daily.    Historical Provider, MD  nitroGLYCERIN (NITROSTAT) 0.4 MG SL tablet Place 1 tablet (0.4 mg total) under the tongue every 5 (five) minutes as needed for chest pain (up to 3 doses). Do not take this medication within 24 hours of taking a Viagra tablet. 03/04/13   Dorena Cookey, MD  sildenafil (VIAGRA) 50 MG tablet Take 1 tablet (50 mg total) by mouth daily as needed. For erectile dysfunction. Do not take this medication within 24 hours of taking a nitroglycerin tablet or nitroglycerin-containing medications. 03/04/13   Dorena Cookey, MD  traMADol Veatrice Bourbon) 50 MG tablet One half to one tablet 3 times daily when necessary for pain 03/04/13   Dorena Cookey, MD   BP 162/62  Pulse 58  Temp(Src) 98.3 F (36.8 C) (Oral)  Resp 18  SpO2 100% Physical Exam  Nursing note and vitals reviewed. Constitutional: He is oriented to person, place, and time. He appears well-developed and well-nourished.  HENT:  Head: Normocephalic.  Eyes: EOM are normal.  Neck: Normal range of motion.  Musculoskeletal: He exhibits tenderness.  Swollen right hand,   From,  nv and ns intact  Neurological: He is alert and oriented to person, place, and time.  Psychiatric: He has a normal mood and affect.    ED Course  Procedures (including critical care time) Labs Review Labs Reviewed - No data to display  Imaging Review No results found.   EKG Interpretation None      MDM   Final diagnoses:  None    Solumedrol IM    Fransico Meadow, [name] 12/04/13 1627

## 2013-12-04 NOTE — Discharge Instructions (Signed)

## 2013-12-04 NOTE — ED Notes (Addendum)
Patient has right hand pain and swelling. He sees a rheumatologist, but will be out of town next week. He usually gets a steroid shot when his gout or arthritis is flared up.

## 2013-12-04 NOTE — ED Notes (Signed)
No adverse effects noted to IM injection.  

## 2013-12-06 NOTE — Telephone Encounter (Signed)
Closed encounter °

## 2013-12-06 NOTE — ED Provider Notes (Signed)
Medical screening examination/treatment/procedure(s) were performed by non-physician practitioner and as supervising physician I was immediately available for consultation/collaboration.   EKG Interpretation None        Ephraim Hamburger, MD 12/06/13 [phone]

## 2014-01-15 ENCOUNTER — Other Ambulatory Visit: Payer: Self-pay | Admitting: Family Medicine

## 2014-02-10 ENCOUNTER — Encounter: Payer: Self-pay | Admitting: Cardiology

## 2014-02-10 ENCOUNTER — Ambulatory Visit (INDEPENDENT_AMBULATORY_CARE_PROVIDER_SITE_OTHER): Payer: BC Managed Care – PPO | Admitting: Cardiology

## 2014-02-10 VITALS — BP 165/71 | HR 51 | Ht 67.0 in | Wt 215.9 lb

## 2014-02-10 DIAGNOSIS — I10 Essential (primary) hypertension: Secondary | ICD-10-CM

## 2014-02-10 DIAGNOSIS — I2510 Atherosclerotic heart disease of native coronary artery without angina pectoris: Secondary | ICD-10-CM

## 2014-02-10 DIAGNOSIS — E785 Hyperlipidemia, unspecified: Secondary | ICD-10-CM

## 2014-02-10 NOTE — Patient Instructions (Signed)
We will check fasting lab work  Continue your current therapy  I will see you in 6 months. 

## 2014-02-11 LAB — HEPATIC FUNCTION PANEL
ALBUMIN: 4.1 g/dL (ref 3.5–5.2)
ALT: 19 U/L (ref 0–53)
AST: 21 U/L (ref 0–37)
Alkaline Phosphatase: 53 U/L (ref 39–117)
Bilirubin, Direct: 0.1 mg/dL (ref 0.0–0.3)
Indirect Bilirubin: 0.6 mg/dL (ref 0.2–1.2)
TOTAL PROTEIN: 6.4 g/dL (ref 6.0–8.3)
Total Bilirubin: 0.7 mg/dL (ref 0.2–1.2)

## 2014-02-11 LAB — BASIC METABOLIC PANEL
BUN: 20 mg/dL (ref 6–23)
CHLORIDE: 104 meq/L (ref 96–112)
CO2: 24 mEq/L (ref 19–32)
Calcium: 9.3 mg/dL (ref 8.4–10.5)
Creat: 1.24 mg/dL (ref 0.50–1.35)
Glucose, Bld: 105 mg/dL — ABNORMAL HIGH (ref 70–99)
POTASSIUM: 4.9 meq/L (ref 3.5–5.3)
Sodium: 139 mEq/L (ref 135–145)

## 2014-02-11 LAB — LIPID PANEL
Cholesterol: 180 mg/dL (ref 0–200)
HDL: 51 mg/dL (ref >39)
LDL CALC: 110 mg/dL — AB (ref 0–99)
TRIGLYCERIDES: 93 mg/dL (ref <150)
Total CHOL/HDL Ratio: 3.5 Ratio
VLDL: 19 mg/dL (ref 0–40)

## 2014-02-11 NOTE — Progress Notes (Signed)
Todd Spears Date of Birth: September 15, 1945 Medical Record #[id number]  History of Present Illness: Todd Spears is seen back today for a followup visit. He is s/p 2 vessel PCI with Promus DES for 90% mid LAD and 95% mid RCA disease on 04/06/2012. His EF is normal at 55%. At that time he really had no significant anginal symptoms but had an abnormal stress test. Last Myoview in Nov. 2014 showed a fixed inferior defect without ischemia. Other issues include CKD, HTN, obesity, gout,,OA, ED, carotid bruit and asthma. On followup today he continues to do well from a cardiac standpoint. No chest pain or SOB. His blood pressure has been well controlled at home. He is intolerant of statins but has been able to tolerate Crestor 3 days a week.   Current Outpatient Prescriptions on File Prior to Visit  Medication Sig Dispense Refill  . albuterol (PROVENTIL HFA;VENTOLIN HFA) 108 (90 BASE) MCG/ACT inhaler Inhale 2 puffs into the lungs every 6 (six) hours as needed. For shortness of breath  2 Inhaler  1  . amLODipine (NORVASC) 10 MG tablet Take 1 tablet (10 mg total) by mouth daily.  100 tablet  3  . aspirin 81 MG chewable tablet Chew 81 mg by mouth daily.      Marland Kitchen COLCRYS 0.6 MG tablet Take 0.6 mg by mouth daily.       . Cyanocobalamin (VITAMIN B 12 PO) Take 1 tablet by mouth daily.       Marland Kitchen esomeprazole (NEXIUM) 40 MG capsule TAKE 1 CAPSULE DAILY BEFORE BREAKFAST  100 capsule  3  . Febuxostat (ULORIC PO) Take by mouth.      . FeFum-FePoly-FA-B Cmp-C-Biot (INTEGRA PLUS PO) Take 1 tablet by mouth daily.      . halobetasol (ULTRAVATE) 0.05 % cream Apply topically 2 (two) times daily.  50 g  4  . HYDROcodone-homatropine (HYCODAN) 5-1.5 MG/5ML syrup Use half to one teaspoon at bedtime as needed for cough  240 mL  0  . lisinopril (PRINIVIL,ZESTRIL) 20 MG tablet TAKE 1 TABLET TWICE A DAY  200 tablet  1  . loratadine (CLARITIN) 10 MG tablet Take 10 mg by mouth daily.      . metaxalone (SKELAXIN) 800 MG tablet TAKE ONE-HALF TO  ONE TABLET BY MOUTH AT BEDTIME  60 tablet  0  . metoprolol succinate (TOPROL-XL) 50 MG 24 hr tablet Take 1 tablet (50 mg total) by mouth daily. Take with or immediately following a meal.  100 tablet  3  . mometasone (NASONEX) 50 MCG/ACT nasal spray Place 2 sprays into the nose as needed.  17 g  11  . Multiple Vitamins-Minerals (VISION-VITE PRESERVE PO) Take 1 tablet by mouth daily.      . nitroGLYCERIN (NITROSTAT) 0.4 MG SL tablet Place 1 tablet (0.4 mg total) under the tongue every 5 (five) minutes as needed for chest pain (up to 3 doses). Do not take this medication within 24 hours of taking a Viagra tablet.  25 tablet  4  . sildenafil (VIAGRA) 50 MG tablet Take 1 tablet (50 mg total) by mouth daily as needed. For erectile dysfunction. Do not take this medication within 24 hours of taking a nitroglycerin tablet or nitroglycerin-containing medications.  10 tablet  6  . traMADol (ULTRAM) 50 MG tablet One half to one tablet 3 times daily when necessary for pain  100 tablet  3   No current facility-administered medications on file prior to visit.    Allergies  Allergen Reactions  .  Hydralazine     Cramps, nightmares  . Prednisone     REACTION: swelling, SOB    Past Medical History  Diagnosis Date  . Allergic rhinitis   . GERD (gastroesophageal reflux disease)   . Hyperlipidemia   . Hypertension   . Osteoarthritis   . ED (erectile dysfunction)   . Carotid bruit     r  . Renal insufficiency   . Asthma   . Obesity   . CAD (coronary artery disease)     a. s/p PTCA/stenting of the mid LAD and mid RCA 04/06/12  . Aortic stenosis     a. Mild by echo 03/24/12.  Marland Kitchen Anemia     Past Surgical History  Procedure Laterality Date  . Nm pet lymphoma      left side of neck 1999  . Cardiac stents Bilateral     History  Smoking status  . Former Smoker  Smokeless tobacco  . Never Used    History  Alcohol Use No    Family History  Problem Relation Age of Onset  . Hypertension    .  Stroke    . Coronary artery disease Father     in his 3s  . Heart disease Father   . Heart attack Father     Review of Systems: The review of systems is per the HPI.  All other systems were reviewed and are negative.  Physical Exam: BP 165/71  Pulse 51  Ht 5\' 7"  (1.702 m)  Wt 215 lb 14.4 oz (97.932 kg)  BMI 33.81 kg/m2 Patient is very pleasant and in no acute distress. Skin is warm and dry. Color is normal.  HEENT is unremarkable. Normocephalic/atraumatic. PERRL. Sclera are nonicteric. Neck is supple. No masses. No JVD. Lungs are clear. Cardiac exam shows a regular rate and rhythm. Abdomen is soft. Extremities are without edema. Gait and ROM are intact. No gross neurologic deficits noted.  LABORATORY DATA: Lab Results  Component Value Date   WBC 6.1 02/25/2013   HGB 11.6* 02/25/2013   HCT 34.3* 02/25/2013   PLT 231.0 02/25/2013   GLUCOSE 91 02/25/2013   CHOL 194 02/25/2013   TRIG 63.0 02/25/2013   HDL 58.70 02/25/2013   LDLDIRECT 164.9 12/10/2010   LDLCALC 123* 02/25/2013   ALT 18 02/25/2013   AST 21 02/25/2013   NA 137 02/25/2013   K 4.9 02/25/2013   CL 100 02/25/2013   CREATININE 1.2 02/25/2013   BUN 15 02/25/2013   CO2 30 02/25/2013   TSH 2.31 02/25/2013   PSA 0.59 02/25/2013   INR 1.0 04/03/2012   Assessment / Plan:  1. CAD - status post 2 vessel PCI with DES in November 2013- doing well. Continue current therapy with aspirin.  Subsequent Myoview study was low risk with a small area of inferobasal scar and no ischemia. We will continue medical therapy and risk factor modification.  2. HTN - blood pressure is elevated today but readings at home have been good. Will monitor on current therapy.  3. Anemia   4. CKD - followed by nephrology.  5. Hyperlipidemia. Intolerant of statins in the past. Maximal tolerated dose of Crestor 5 mg 3x/week. Will check fasting lab today.  He may be a candidate for PCSK9 drug.

## 2014-02-25 ENCOUNTER — Other Ambulatory Visit: Payer: Self-pay | Admitting: Family Medicine

## 2014-03-14 ENCOUNTER — Ambulatory Visit: Payer: BC Managed Care – PPO | Admitting: *Deleted

## 2014-03-24 ENCOUNTER — Other Ambulatory Visit: Payer: Self-pay | Admitting: Family Medicine

## 2014-03-25 ENCOUNTER — Telehealth: Payer: Self-pay | Admitting: Cardiology

## 2014-03-25 MED ORDER — HYDRALAZINE HCL 25 MG PO TABS: 25.0000 mg | ORAL_TABLET | Freq: Three times a day (TID) | ORAL | Status: DC

## 2014-03-25 NOTE — Telephone Encounter (Signed)
Returned call to patient he stated around 10:00 am this morning he started having severe left arm pain ,no chest pain,no sob.Stated he does not remember doing anything to cause pain.Stated he took 2 325 mg aspirin.Stated left arm pain continued and at 12:50 pm he took 2 more 325 mg aspirin.Stated left arm pain is gone but B/P is elevated.Stated B/P ranging today 336/12,244,975 systolic.Stated at present 183/84.Pulse ranging 56,62,57 bpm.Stated he wanted to know what to do about elevated B/P.Message sent to [place] for advice.Advised if starts having left arm pain again he needs to go to ER.

## 2014-03-25 NOTE — Addendum Note (Signed)
Addended by: Golden Hurter D on: 03/25/2014 06:20 PM   Modules accepted: Orders

## 2014-03-25 NOTE — Telephone Encounter (Signed)
He is already on maximum doses of Toprol, amlodipine, and lisinopril. We can add hydralazine. 25 mg tid for BP.  Peter Martinique MD, [facility]

## 2014-03-25 NOTE — Telephone Encounter (Signed)
Returned call to patient  Dr.Jordan advised continue all other medications and start Hydralazine 25 mg three times a day.Advised to call back if B/P still elevated.Advised to go to ER if has any more left arm pain.

## 2014-03-26 ENCOUNTER — Encounter: Payer: Self-pay | Admitting: Family Medicine

## 2014-03-28 NOTE — Telephone Encounter (Signed)
Received call from patient he stated he started taking Hydralazine 25 mg three times a day 03/25/14.Stated he feels better this morning no left arm pain.Stated B/P is improving this morning 133/71 pulse 62.B/P over the weekend ranging 150/70,152/76,149/80,144/72.Advised to continue to monitor B/P and call back if needed.

## 2014-03-30 ENCOUNTER — Encounter: Payer: Self-pay | Admitting: Podiatrist

## 2014-03-30 ENCOUNTER — Ambulatory Visit (INDEPENDENT_AMBULATORY_CARE_PROVIDER_SITE_OTHER): Payer: BC Managed Care – PPO | Admitting: Podiatrist

## 2014-03-30 VITALS — BP 154/76 | HR 60 | Resp 12

## 2014-03-30 DIAGNOSIS — Q828 Other specified congenital malformations of skin: Secondary | ICD-10-CM

## 2014-03-30 NOTE — Patient Instructions (Signed)
Corns and Calluses Corns are small areas of thickened skin that usually occur on the top, sides, or tip of a toe. They contain a cone-shaped core with a point that can press on a nerve below. This causes pain. Calluses are areas of thickened skin that usually develop on hands, fingers, palms, soles of the feet, and heels. These are areas that experience frequent friction or pressure. CAUSES  Corns are usually the result of rubbing (friction) or pressure from shoes that are too tight or do not fit properly. Calluses are caused by repeated friction and pressure on the affected areas. SYMPTOMS  A hard growth on the skin.  Pain or tenderness under the skin.  Sometimes, redness and swelling.  Increased discomfort while wearing tight-fitting shoes. DIAGNOSIS  Your caregiver can usually tell what the problem is by doing a physical exam. TREATMENT  Removing the cause of the friction or pressure is usually the only treatment needed. However, sometimes medicines can be used to help soften the hardened, thickened areas. These medicines include salicylic acid plasters and 12% ammonium lactate lotion. These medicines should only be used under the direction of your caregiver. HOME CARE INSTRUCTIONS   Try to remove pressure from the affected area.  You may wear donut-shaped corn pads to protect your skin.  You may use a pumice stone or nonmetallic nail file to gently reduce the thickness of a corn.  Wear properly fitted footwear.  If you have calluses on the hands, wear gloves during activities that cause friction.  If you have diabetes, you should regularly examine your feet. Tell your caregiver if you notice any problems with your feet. SEEK IMMEDIATE MEDICAL CARE IF:   You have increased pain, swelling, redness, or warmth in the affected area.  Your corn or callus starts to drain fluid or bleeds.  You are not getting better, even with treatment. Document Released: 02/24/2004 Document  Revised: 08/12/2011 Document Reviewed: 01/15/2011 Exit[employer] Patient Information 2015 [employer]. This information is not intended to replace advice given to you by your health care provider. Make sure you discuss any questions you have with your health care provider.  

## 2014-03-30 NOTE — Progress Notes (Signed)
   Subjective:    Patient ID: Todd Spears, male    DOB: 03/18/1946, 68 y.o.   MRN: [id number]  HPI NEW PROBLEM:  PT STATED RT BOTTOM OF THE HEEL HAVE THICK SKIN/KNOT AND BEEN HURTING FOR 2 MONTHS. THE HEEL IS BEEN THE SAME, BUT NOT WORSE. THE FOOT GET AGGRAVATED BY PRESSURE/WALKING. TRIED TO KEEP IT TRIM.   Review of Systems  All other systems reviewed and are negative.      Objective:   Physical Exam  Patient is awake, alert, and oriented x 3. In no acute distress. Vascular status is intact with palpable pedal pulses at 2/4 DP and PT bilateral and capillary refill time within normal limits. Neurological sensation is also intact bilaterally via Semmes Weinstein monofilament at 5/5 sites. Light touch, vibratory sensation, Achilles tendon reflex is intact. General Dermatological exam reveals skin color, turger and texture as normal. No open lesions present. Musculature intact with dorsiflexion, plantarflexion, inversion, eversion.   High arched foot type with prominent metatarsal heads present. Keratotic lesion present with is intractable and has a waxy appearance present right heel.      Assessment & Plan:  Porokeratotic lesion right heel  Plan:  debridment of lesion accomplished today without complication.  He will call if this fails to relieve his discomfort.

## 2014-04-05 ENCOUNTER — Telehealth: Payer: Self-pay | Admitting: Cardiology

## 2014-04-05 MED ORDER — ROSUVASTATIN CALCIUM 5 MG PO TABS: 5.0000 mg | ORAL_TABLET | ORAL | Status: DC

## 2014-04-05 MED ORDER — HYDRALAZINE HCL 25 MG PO TABS: 25.0000 mg | ORAL_TABLET | Freq: Three times a day (TID) | ORAL | Status: DC

## 2014-04-05 NOTE — Telephone Encounter (Signed)
Pt would like some samples of Crestor please. He also wants to know if Dr Martinique is going to keep him on Hydralazine? If so he will need a new prescription for a 90 days supply at [facility] please.

## 2014-04-05 NOTE — Telephone Encounter (Signed)
No crestor samples - Rx sent to [facility] Hydralazine refilled to [facility]  Patient advised to contact [facility] office to see if they have samples

## 2014-04-06 ENCOUNTER — Telehealth: Payer: Self-pay | Admitting: Cardiology

## 2014-04-06 NOTE — Telephone Encounter (Signed)
Returned call to patient no answer.LMTC. 

## 2014-04-06 NOTE — Telephone Encounter (Signed)
Pt called in wanting to speak to [facility] about his BP readings and discuss some new medications he was put on. He will be faxing his readings to our office. Please call  Thanks

## 2014-04-07 NOTE — Telephone Encounter (Signed)
Received call from patient wanting to make sure I received his recent B/P readings.I did receive B/P readings will show Dr.Jordan next week.He is out of office this week.Stated he feels good, B/P still alittle elevated, but has improved.Stated taking all medications as prescribed.

## 2014-04-11 ENCOUNTER — Encounter: Payer: Self-pay | Admitting: Family Medicine

## 2014-04-11 ENCOUNTER — Ambulatory Visit (INDEPENDENT_AMBULATORY_CARE_PROVIDER_SITE_OTHER): Payer: BC Managed Care – PPO | Admitting: Family Medicine

## 2014-04-11 VITALS — BP 130/70 | Temp 97.9°F | Wt 221.0 lb

## 2014-04-11 DIAGNOSIS — R1030 Lower abdominal pain, unspecified: Secondary | ICD-10-CM

## 2014-04-11 DIAGNOSIS — R1031 Right lower quadrant pain: Secondary | ICD-10-CM | POA: Insufficient documentation

## 2014-04-11 NOTE — Progress Notes (Signed)
   Subjective:    Patient ID: Todd Spears, male    DOB: 04-05-1946, 68 y.o.   MRN: [id number]  HPI Todd Spears is a 68 year old male who comes in today with a 2 month history of intermittent pain in his right groin  He says he has no history of trauma but he noticed a nap months ago some intermittent pain in the right groin. It will come on suddenly last for minutes to sometimes a bit longer and goes away. It's a sharp pain  No history of any trauma no history of any urinary tract difficulties   Review of Systems Review of systems otherwise negative    Objective:   Physical Exam  Well-developed well-nourished male no acute distress in supine position I think I can palpate a small quarter size right inguinal hernia on that right side      Assessment & Plan:  Right groin pain question right inguinal hernia,,,,,,,,, consult with Dr. Donne Hazel

## 2014-04-11 NOTE — Patient Instructions (Signed)
Call the general surgery office and asked to see Dr. Serita Grammes for a consult,,,,,,,,,,, question right inguinal hernia

## 2014-04-13 ENCOUNTER — Telehealth: Payer: Self-pay | Admitting: *Deleted

## 2014-04-13 DIAGNOSIS — R1031 Right lower quadrant pain: Secondary | ICD-10-CM

## 2014-04-13 NOTE — Telephone Encounter (Signed)
Patient tried to get an appointment for his right groin pain but Dr Cristal Generous office will require an ultrasound done before they can schedule an appointment.   Okay to order?

## 2014-04-14 ENCOUNTER — Other Ambulatory Visit: Payer: Self-pay | Admitting: Family Medicine

## 2014-04-14 NOTE — Telephone Encounter (Signed)
Okay per Dr Todd. Order placed. 

## 2014-04-15 NOTE — Telephone Encounter (Signed)
Returned call to patient no answer.LMTC. 

## 2014-04-19 ENCOUNTER — Ambulatory Visit
Admission: RE | Admit: 2014-04-19 | Discharge: 2014-04-19 | Disposition: A | Discharge status: Home / Self Care | Payer: BC Managed Care – PPO | Source: Ambulatory Visit | Attending: Family Medicine | Admitting: Family Medicine

## 2014-04-19 ENCOUNTER — Other Ambulatory Visit: Payer: Self-pay | Admitting: Family Medicine

## 2014-04-19 DIAGNOSIS — R1031 Right lower quadrant pain: Secondary | ICD-10-CM

## 2014-04-20 NOTE — Telephone Encounter (Signed)
Returned call to patient Dr.Jordan reviewed B/P readings you mailed to office.He advised to continue same medications,no change.Advised to continue to monitor B/P and bring B/P diary to next visit.

## 2014-05-12 ENCOUNTER — Encounter (HOSPITAL_COMMUNITY): Payer: Self-pay | Admitting: Cardiology

## 2014-06-13 ENCOUNTER — Telehealth: Payer: Self-pay | Admitting: Family Medicine

## 2014-06-13 NOTE — Telephone Encounter (Signed)
Pt was not scheduled cpx labs and has decided to come in prior to appt.  Pt wanted to know if dr todd wanted any bc he was not scheduled. Made pt appt and advised I would let you know

## 2014-06-13 NOTE — Telephone Encounter (Signed)
Left message on machine for patient to return our call 

## 2014-06-14 ENCOUNTER — Other Ambulatory Visit (INDEPENDENT_AMBULATORY_CARE_PROVIDER_SITE_OTHER): Payer: BLUE CROSS/BLUE SHIELD

## 2014-06-14 DIAGNOSIS — Z0000 Encounter for general adult medical examination without abnormal findings: Secondary | ICD-10-CM

## 2014-06-14 LAB — BASIC METABOLIC PANEL
BUN: 18 mg/dL (ref 6–23)
CHLORIDE: 104 meq/L (ref 96–112)
CO2: 29 mEq/L (ref 19–32)
Calcium: 9.3 mg/dL (ref 8.4–10.5)
Creatinine, Ser: 1.1 mg/dL (ref 0.4–1.5)
GFR: 69.09 mL/min (ref >60.00)
GLUCOSE: 110 mg/dL — AB (ref 70–99)
POTASSIUM: 5 meq/L (ref 3.5–5.1)
Sodium: 137 mEq/L (ref 135–145)

## 2014-06-14 LAB — LIPID PANEL
Cholesterol: 180 mg/dL (ref 0–200)
HDL: 51.3 mg/dL (ref >39.00)
LDL CALC: 109 mg/dL — AB (ref 0–99)
NONHDL: 128.7
Total CHOL/HDL Ratio: 4
Triglycerides: 98 mg/dL (ref 0.0–149.0)
VLDL: 19.6 mg/dL (ref 0.0–40.0)

## 2014-06-14 LAB — HEPATIC FUNCTION PANEL
ALK PHOS: 47 U/L (ref 39–117)
ALT: 24 U/L (ref 0–53)
AST: 26 U/L (ref 0–37)
Albumin: 3.9 g/dL (ref 3.5–5.2)
BILIRUBIN DIRECT: 0.1 mg/dL (ref 0.0–0.3)
TOTAL PROTEIN: 6.4 g/dL (ref 6.0–8.3)
Total Bilirubin: 0.7 mg/dL (ref 0.2–1.2)

## 2014-06-14 LAB — POCT URINALYSIS DIPSTICK
BILIRUBIN UA: NEGATIVE
Blood, UA: NEGATIVE
Glucose, UA: NEGATIVE
KETONES UA: NEGATIVE
Leukocytes, UA: NEGATIVE
Nitrite, UA: NEGATIVE
Protein, UA: NEGATIVE
Spec Grav, UA: 1.01
Urobilinogen, UA: 0.2
pH, UA: 5.5

## 2014-06-14 LAB — CBC WITH DIFFERENTIAL/PLATELET
Basophils Absolute: 0.1 10*3/uL (ref 0.0–0.1)
Basophils Relative: 1.1 % (ref 0.0–3.0)
EOS ABS: 0.7 10*3/uL (ref 0.0–0.7)
EOS PCT: 10 % — AB (ref 0.0–5.0)
HCT: 36.9 % — ABNORMAL LOW (ref 39.0–52.0)
Hemoglobin: 12.2 g/dL — ABNORMAL LOW (ref 13.0–17.0)
LYMPHS PCT: 37.7 % (ref 12.0–46.0)
Lymphs Abs: 2.7 10*3/uL (ref 0.7–4.0)
MCHC: 33 g/dL (ref 30.0–36.0)
MCV: 91.4 fl (ref 78.0–100.0)
MONO ABS: 0.5 10*3/uL (ref 0.1–1.0)
Monocytes Relative: 6.8 % (ref 3.0–12.0)
NEUTROS PCT: 44.4 % (ref 43.0–77.0)
Neutro Abs: 3.2 10*3/uL (ref 1.4–7.7)
PLATELETS: 220 10*3/uL (ref 150.0–400.0)
RBC: 4.04 Mil/uL — ABNORMAL LOW (ref 4.22–5.81)
RDW: 13.7 % (ref 11.5–15.5)
WBC: 7.1 10*3/uL (ref 4.0–10.5)

## 2014-06-14 LAB — TSH: TSH: 2.2 u[IU]/mL (ref 0.35–4.50)

## 2014-06-14 LAB — PSA: PSA: 0.58 ng/mL (ref 0.10–4.00)

## 2014-06-15 NOTE — Telephone Encounter (Signed)
Spoke with patient.

## 2014-06-16 ENCOUNTER — Ambulatory Visit (INDEPENDENT_AMBULATORY_CARE_PROVIDER_SITE_OTHER): Payer: BLUE CROSS/BLUE SHIELD | Admitting: Family Medicine

## 2014-06-16 ENCOUNTER — Encounter: Payer: Self-pay | Admitting: Family Medicine

## 2014-06-16 VITALS — BP 120/68 | Temp 98.3°F | Ht 66.0 in | Wt 223.0 lb

## 2014-06-16 DIAGNOSIS — E785 Hyperlipidemia, unspecified: Secondary | ICD-10-CM

## 2014-06-16 DIAGNOSIS — I10 Essential (primary) hypertension: Secondary | ICD-10-CM

## 2014-06-16 DIAGNOSIS — N189 Chronic kidney disease, unspecified: Secondary | ICD-10-CM

## 2014-06-16 DIAGNOSIS — D631 Anemia in chronic kidney disease: Secondary | ICD-10-CM

## 2014-06-16 DIAGNOSIS — Z23 Encounter for immunization: Secondary | ICD-10-CM

## 2014-06-16 MED ORDER — AMLODIPINE BESYLATE 10 MG PO TABS: 10.0000 mg | ORAL_TABLET | Freq: Every day | ORAL | Status: DC

## 2014-06-16 MED ORDER — ESOMEPRAZOLE MAGNESIUM 40 MG PO CPDR: DELAYED_RELEASE_CAPSULE | ORAL | Status: DC

## 2014-06-16 MED ORDER — METOPROLOL SUCCINATE ER 50 MG PO TB24: ORAL_TABLET | ORAL | Status: DC

## 2014-06-16 MED ORDER — LISINOPRIL 20 MG PO TABS: 20.0000 mg | ORAL_TABLET | Freq: Two times a day (BID) | ORAL | Status: DC

## 2014-06-16 NOTE — Progress Notes (Signed)
Pre visit review using our clinic review tool, if applicable. No additional management support is needed unless otherwise documented below in the visit note. 

## 2014-06-16 NOTE — Progress Notes (Signed)
Subjective:    Patient ID: Todd Spears, male    DOB: 1945/08/17, 69 y.o.   MRN: [id number]  HPI Todd Spears is a 69 year old male nonsmoker who comes in today for evaluation of hypertension hyperlipidemia reflux esophagitis  Her hypertension takes Norvasc 10 mg, Apresoline 25 mg 3 times daily, lisinopril 20 mg daily, Toprol 50 mg daily. BP here today 120/68. He sees his cardiologist Dr. Martinique on a yearly basis because of underlying coronary artery disease.  He takes colchicine at the direction of his rheumatologist  He takes Nexium 40 mg daily because of a history of reflux  He uses over-the-counter medication for his allergic rhinitis  Dr. Martinique has some on Crestor 5 mg Monday Wednesday Friday he's to go back and see him in March for follow-up.  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   HENT: Negative.   Eyes: Negative.   Respiratory: Negative.   Cardiovascular: Negative.   Gastrointestinal: Negative.   Endocrine: Negative.   Genitourinary: Negative.   Musculoskeletal: Negative.   Skin: Negative.   Allergic/Immunologic: Negative.   Neurological: Negative.   Hematological: Negative.   Psychiatric/Behavioral: Negative.        Objective:   Physical Exam  Constitutional: He is oriented to person, place, and time. He appears well-developed and well-nourished.  HENT:  Head: Normocephalic and atraumatic.  Right Ear: External ear normal.  Left Ear: External ear normal.  Nose: Nose normal.  Mouth/Throat: Oropharynx is clear and moist.  Eyes: Conjunctivae and EOM are normal. Pupils are equal, round, and reactive to light.  Neck: Normal range of motion. Neck supple. No JVD  present. No tracheal deviation present. No thyromegaly present.  Cardiovascular: Normal rate, regular rhythm, normal heart sounds and intact distal pulses.  Exam reveals no gallop and no friction rub.   No murmur heard. Right carotid bruit unchanged..... States Dr. Martinique checks it frequently and does frequent ultrasounds. Asymptomatic  Pulmonary/Chest: Effort normal and breath sounds normal. No stridor. No respiratory distress. He has no wheezes. He has no rales. He exhibits no tenderness.  Abdominal: Soft. Bowel sounds are normal. He exhibits no distension and no mass. There is no tenderness. There is no rebound and no guarding.  Genitourinary: Rectum normal and penis normal. Guaiac negative stool. No penile tenderness.  1+ symmetrical nonnodular BPH  Musculoskeletal: Normal range of motion. He exhibits no edema or tenderness.  Lymphadenopathy:    He has no cervical adenopathy.  Neurological: He is alert and oriented to person, place, and time. He has normal reflexes. No cranial nerve deficit. He exhibits normal muscle tone.  Skin: Skin is warm and dry. No rash noted. No erythema. No pallor.  Total body skin exam normal  Psychiatric: He has a normal mood and affect. His behavior is normal. Judgment and thought content normal.  Nursing note and vitals reviewed.         Assessment & Plan:  Hypertension at goal........ continue current therapy  Hyperlipidemia........ continue Crestor 5 mg Monday Wednesday Friday under the direction of Dr. Martinique  History of gout followed by rheumatology  Underlying coronary artery disease followed by Dr. Martinique  Obesity weight unchanged 223 pounds....... again recommended diet exercise and weight  loss

## 2014-06-16 NOTE — Patient Instructions (Addendum)
Continue current medications  Follow-up in one year sooner if any problem  Remember to do that walking 30 minutes daily  Healthcare power of attorney and living well

## 2014-06-17 ENCOUNTER — Telehealth: Payer: Self-pay | Admitting: Family Medicine

## 2014-06-17 NOTE — Telephone Encounter (Signed)
emmi emailed °

## 2014-08-11 ENCOUNTER — Ambulatory Visit (INDEPENDENT_AMBULATORY_CARE_PROVIDER_SITE_OTHER): Payer: BLUE CROSS/BLUE SHIELD | Admitting: Cardiology

## 2014-08-11 ENCOUNTER — Encounter: Payer: Self-pay | Admitting: Cardiology

## 2014-08-11 VITALS — BP 150/62 | HR 55 | Ht 67.0 in | Wt 223.4 lb

## 2014-08-11 DIAGNOSIS — I2510 Atherosclerotic heart disease of native coronary artery without angina pectoris: Secondary | ICD-10-CM

## 2014-08-11 DIAGNOSIS — E785 Hyperlipidemia, unspecified: Secondary | ICD-10-CM

## 2014-08-11 DIAGNOSIS — I10 Essential (primary) hypertension: Secondary | ICD-10-CM

## 2014-08-11 NOTE — Patient Instructions (Signed)
Continue your current therapy  I will see you in 6 months.   

## 2014-08-12 NOTE — Progress Notes (Signed)
[name] Date of Birth: 01-Dec-1945 Medical Record #[id number]  History of Present Illness: Todd Spears is seen back today for follow up CAD. He is s/p 2 vessel PCI with Promus DES for 90% mid LAD and 95% mid RCA disease on 04/06/2012. His EF is normal at 55%. At that time he really had no significant anginal symptoms but had an abnormal stress test. Last Myoview in Nov. 2014 showed a fixed inferior defect without ischemia. Other issues include CKD, HTN, obesity, gout,,OA, ED, carotid bruit and asthma. On followup today he continues to do well from a cardiac standpoint. No chest pain or SOB. His blood pressure has been well controlled at home. He is intolerant of statins but has been able to tolerate Crestor 3 days a week. His activity has been limited recently due to pain in his right groin with flexion. He has been seen by Dr. Donne Hazel who thinks it is a muscular problem.    Current Outpatient Prescriptions on File Prior to Visit  Medication Sig Dispense Refill  . albuterol (PROVENTIL HFA;VENTOLIN HFA) 108 (90 BASE) MCG/ACT inhaler Inhale 2 puffs into the lungs every 6 (six) hours as needed. For shortness of breath 2 Inhaler 1  . amLODipine (NORVASC) 10 MG tablet Take 1 tablet (10 mg total) by mouth daily. 100 tablet 3  . aspirin 81 MG chewable tablet Chew 81 mg by mouth daily.    Marland Kitchen COLCRYS 0.6 MG tablet Take 0.6 mg by mouth daily.     . Cyanocobalamin (VITAMIN B 12 PO) Take 1 tablet by mouth daily.     Marland Kitchen esomeprazole (NEXIUM) 40 MG capsule TAKE 1 CAPSULE DAILY BEFORE BREAKFAST 100 capsule 3  . halobetasol (ULTRAVATE) 0.05 % cream Apply topically 2 (two) times daily. 50 g 4  . hydrALAZINE (APRESOLINE) 25 MG tablet Take 1 tablet (25 mg total) by mouth 3 (three) times daily. 270 tablet 3  . lisinopril (PRINIVIL,ZESTRIL) 20 MG tablet Take 1 tablet (20 mg total) by mouth 2 (two) times daily. 200 tablet 3  . loratadine (CLARITIN) 10 MG tablet Take 10 mg by mouth daily.    . metoprolol succinate  (TOPROL-XL) 50 MG 24 hr tablet TAKE 1 TABLET DAILY WITH OR IMMEDIATELY FOLLOWING A MEAL 100 tablet 3  . mometasone (NASONEX) 50 MCG/ACT nasal spray Place 2 sprays into the nose as needed. 17 g 11  . Multiple Vitamins-Minerals (VISION-VITE PRESERVE PO) Take 1 tablet by mouth daily.    . nitroGLYCERIN (NITROSTAT) 0.4 MG SL tablet Place 1 tablet (0.4 mg total) under the tongue every 5 (five) minutes as needed for chest pain (up to 3 doses). Do not take this medication within 24 hours of taking a Viagra tablet. 25 tablet 4  . rosuvastatin (CRESTOR) 5 MG tablet Take 1 tablet (5 mg total) by mouth 3 (three) times a week. M-W-F 12 tablet 3  . traMADol (ULTRAM) 50 MG tablet One half to one tablet 3 times daily when necessary for pain 100 tablet 3   No current facility-administered medications on file prior to visit.    Allergies  Allergen Reactions  . Prednisone     REACTION: swelling, SOB    Past Medical History  Diagnosis Date  . Allergic rhinitis   . GERD (gastroesophageal reflux disease)   . Hyperlipidemia   . Hypertension   . Osteoarthritis   . ED (erectile dysfunction)   . Carotid bruit     r  . Renal insufficiency   . Asthma   .  Obesity   . CAD (coronary artery disease)     a. s/p PTCA/stenting of the mid LAD and mid RCA 04/06/12  . Aortic stenosis     a. Mild by echo 03/24/12.  Marland Kitchen Anemia     Past Surgical History  Procedure Laterality Date  . Nm pet lymphoma      left side of neck 1999  . Cardiac stents Bilateral   . Percutaneous coronary stent intervention (pci-s) N/A 04/06/2012    Procedure: PERCUTANEOUS CORONARY STENT INTERVENTION (PCI-S);  Surgeon: Kathryne Ramella M Martinique, MD;  Location: [facility] CATH LAB;  Service: Cardiovascular;  Laterality: N/A;    History  Smoking status  . Former Smoker  Smokeless tobacco  . Never Used    History  Alcohol Use No    Family History  Problem Relation Age of Onset  . Hypertension    . Stroke    . Coronary artery disease Father     in  his 71s  . Heart disease Father   . Heart attack Father     Review of Systems: The review of systems is per the HPI.  All other systems were reviewed and are negative.  Physical Exam: BP 150/62 mmHg  Pulse 55  Ht 5\' 7"  (1.702 m)  Wt 223 lb 6.4 oz (101.334 kg)  BMI 34.98 kg/m2 Patient is very pleasant and in no acute distress. Skin is warm and dry. Color is normal.  HEENT is unremarkable. Normocephalic/atraumatic. PERRL. Sclera are nonicteric. Neck is supple. No masses. No JVD. Lungs are clear. Cardiac exam shows a regular rate and rhythm. Abdomen is soft. Extremities are without edema. Gait and ROM are intact. No gross neurologic deficits noted.  LABORATORY DATA: Lab Results  Component Value Date   WBC 7.1 06/14/2014   HGB 12.2* 06/14/2014   HCT 36.9* 06/14/2014   PLT 220.0 06/14/2014   GLUCOSE 110* 06/14/2014   CHOL 180 06/14/2014   TRIG 98.0 06/14/2014   HDL 51.30 06/14/2014   LDLDIRECT 164.9 12/10/2010   LDLCALC 109* 06/14/2014   ALT 24 06/14/2014   AST 26 06/14/2014   NA 137 06/14/2014   K 5.0 06/14/2014   CL 104 06/14/2014   CREATININE 1.1 06/14/2014   BUN 18 06/14/2014   CO2 29 06/14/2014   TSH 2.20 06/14/2014   PSA 0.58 06/14/2014   INR 1.0 04/03/2012    Ecg today shows sinus brady with rate 55 bpm. Otherwise normal. I have personally reviewed and interpreted this study.  Assessment / Plan:  1. CAD - status post 2 vessel PCI with DES in November 2013- doing well. Continue current therapy with aspirin.  Subsequent Myoview study was low risk with a small area of inferobasal scar and no ischemia. We will continue medical therapy and risk factor modification.  2. HTN - blood pressure is elevated today but readings at home have been good. Will monitor on current therapy.  3. Anemia   4. CKD - followed by nephrology.  5. Hyperlipidemia. Intolerant of statins in the past. Maximal tolerated dose of Crestor 5 mg 3x/week. Lipids look fair and LDL is the lowest it  has been. Work on dietary modification and weight loss.

## 2014-08-19 ENCOUNTER — Encounter: Payer: Self-pay | Admitting: Podiatrist

## 2014-08-19 ENCOUNTER — Ambulatory Visit (INDEPENDENT_AMBULATORY_CARE_PROVIDER_SITE_OTHER): Payer: BLUE CROSS/BLUE SHIELD | Admitting: Podiatrist

## 2014-08-19 DIAGNOSIS — M79609 Pain in unspecified limb: Principal | ICD-10-CM

## 2014-08-19 DIAGNOSIS — B351 Tinea unguium: Secondary | ICD-10-CM

## 2014-08-19 DIAGNOSIS — Q828 Other specified congenital malformations of skin: Secondary | ICD-10-CM

## 2014-08-19 DIAGNOSIS — M216X9 Other acquired deformities of unspecified foot: Secondary | ICD-10-CM

## 2014-08-19 NOTE — Progress Notes (Signed)
   Subjective:    Patient ID: [name], male    DOB: 08/19/1945, 69 y.o.   MRN: [id number]  Chief Complaint  Patient presents with  . Nail Problem    TOENAILS AND CALLUS TRIM.          Objective:   Physical Exam  Patient is awake, alert, and oriented x 3.  In no acute distress.  Vascular status is intact with palpable pedal pulses at 2/4 DP and PT bilateral and capillary refill time within normal limits. Neurological sensation is also intact bilaterally via Semmes Weinstein monofilament at 5/5 sites. Light touch, vibratory sensation, Achilles tendon reflex is intact. General Dermatological exam thick, discolored, distrophic and mycotic toenails x 10.  Symptomatic in shoes and with ambulation.  Musculature intact with dorsiflexion, plantarflexion, inversion, eversion.   High arched foot type with prominent metatarsal heads present.  Keratotic lesion present with is intractable and has a waxy appearance present submet 5 bilateral.      Assessment & Plan:  Prominent metatarsals, porokeratotic lesion, symptomatic toenails x 10   Plan:  Debridement of toenails and lesions carried out without complication. He will be seen back in 3 months for continued pallative care.

## 2014-10-05 ENCOUNTER — Other Ambulatory Visit: Payer: Self-pay | Admitting: General Surgery

## 2014-10-05 DIAGNOSIS — R1031 Right lower quadrant pain: Secondary | ICD-10-CM

## 2014-10-07 ENCOUNTER — Other Ambulatory Visit: Payer: BLUE CROSS/BLUE SHIELD

## 2014-10-12 ENCOUNTER — Ambulatory Visit
Admission: RE | Admit: 2014-10-12 | Discharge: 2014-10-12 | Disposition: A | Discharge status: Home / Self Care | Payer: BLUE CROSS/BLUE SHIELD | Source: Ambulatory Visit | Attending: General Surgery | Admitting: General Surgery

## 2014-10-12 DIAGNOSIS — R1031 Right lower quadrant pain: Secondary | ICD-10-CM

## 2014-10-12 MED ORDER — IOHEXOL 300 MG/ML  SOLN
125.0000 mL | Freq: Once | INTRAMUSCULAR | Status: AC | PRN
  Administered 2014-10-12: 125 mL via INTRAVENOUS

## 2014-10-13 ENCOUNTER — Ambulatory Visit: Payer: BLUE CROSS/BLUE SHIELD | Admitting: Cardiology

## 2014-11-11 ENCOUNTER — Ambulatory Visit (INDEPENDENT_AMBULATORY_CARE_PROVIDER_SITE_OTHER): Payer: BLUE CROSS/BLUE SHIELD | Admitting: Family Medicine

## 2014-11-11 ENCOUNTER — Encounter: Payer: Self-pay | Admitting: Family Medicine

## 2014-11-11 VITALS — BP 138/76 | HR 55 | Temp 98.1°F | Wt 221.0 lb

## 2014-11-11 DIAGNOSIS — R0981 Nasal congestion: Secondary | ICD-10-CM | POA: Diagnosis not present

## 2014-11-11 MED ORDER — AMOXICILLIN 875 MG PO TABS: 875.0000 mg | ORAL_TABLET | Freq: Two times a day (BID) | ORAL | Status: DC

## 2014-11-11 NOTE — Progress Notes (Signed)
   Subjective:    Patient ID: Todd Spears, male    DOB: 25-Dec-1945, 69 y.o.   MRN: [id number]  HPI Patient seen with three-week history of nasal congestive symptoms. He initially had some sore throat and cough and nasal congestion and cold-like symptoms. He felt this may have represented allergies. He takes Zyrtec and Flonase at baseline but has had some persistent paranasal/ethmoid sinus pressure. No purulent secretions. No fevers or chills. Cough is relatively mild and dry. No headaches.  Past Medical History  Diagnosis Date  . Allergic rhinitis   . GERD (gastroesophageal reflux disease)   . Hyperlipidemia   . Hypertension   . Osteoarthritis   . ED (erectile dysfunction)   . Carotid bruit     r  . Renal insufficiency   . Asthma   . Obesity   . CAD (coronary artery disease)     a. s/p PTCA/stenting of the mid LAD and mid RCA 04/06/12  . Aortic stenosis     a. Mild by echo 03/24/12.  Marland Kitchen Anemia    Past Surgical History  Procedure Laterality Date  . Nm pet lymphoma      left side of neck 1999  . Cardiac stents Bilateral   . Percutaneous coronary stent intervention (pci-s) N/A 04/06/2012    Procedure: PERCUTANEOUS CORONARY STENT INTERVENTION (PCI-S);  Surgeon: Peter M Martinique, MD;  Location: [facility] CATH LAB;  Service: Cardiovascular;  Laterality: N/A;    reports that he has quit smoking. He has never used smokeless tobacco. He reports that he does not drink alcohol or use illicit drugs. family history includes Coronary artery disease in his father; Heart attack in his father; Heart disease in his father; Hypertension in an other family member; Stroke in an other family member. Allergies  Allergen Reactions  . Prednisone     REACTION: swelling, SOB      Review of Systems  Constitutional: Negative for fever and chills.  HENT: Positive for congestion. Negative for voice change.   Respiratory: Positive for cough. Negative for shortness of breath and wheezing.        Objective:   Physical Exam  Constitutional: He appears well-developed and well-nourished.  HENT:  Right Ear: External ear normal.  Left Ear: External ear normal.  Mouth/Throat: Oropharynx is clear and moist.  Minimal erythema nasal mucosa bilaterally  Neck: Neck supple.  Cardiovascular: Normal rate and regular rhythm.   Pulmonary/Chest: Effort normal and breath sounds normal. No respiratory distress. He has no wheezes. He has no rales.  Musculoskeletal: He exhibits no edema.  Lymphadenopathy:    He has no cervical adenopathy.          Assessment & Plan:  Persistent nasal congestion. Differential is allergic versus acute sinusitis. He's been intolerant of prednisone previously. Continue Zyrtec and Flonase. Observe for now. Consider amoxicillin if he has any fever, progressive headache, purulent secretions, or progressive facial pain

## 2014-11-11 NOTE — Patient Instructions (Signed)
Consider starting the antibiotic for any fever, progressive headache or facial pain, upper teeth pain, or pus-like drainage from nose.

## 2014-11-11 NOTE — Progress Notes (Signed)
Pre visit review using our clinic review tool, if applicable. No additional management support is needed unless otherwise documented below in the visit note. 

## 2014-11-21 ENCOUNTER — Other Ambulatory Visit: Payer: Self-pay | Admitting: *Deleted

## 2014-11-21 ENCOUNTER — Telehealth: Payer: Self-pay | Admitting: Cardiology

## 2014-11-21 MED ORDER — NITROGLYCERIN 0.4 MG SL SUBL: 0.4000 mg | SUBLINGUAL_TABLET | SUBLINGUAL | Status: DC | PRN

## 2014-11-21 NOTE — Telephone Encounter (Signed)
°  1. Which medications need to be refilled? Nitroglycerin-new prescription,ruined his old bottle-no chest pains  2. Which pharmacy is medication to be sent to?[facility]  3. Do they need a 30 day or 90 day supply? 1 bottle  4. Would they like a call back once the medication has been sent to the pharmacy? yes

## 2014-11-25 ENCOUNTER — Ambulatory Visit (INDEPENDENT_AMBULATORY_CARE_PROVIDER_SITE_OTHER): Payer: BLUE CROSS/BLUE SHIELD | Admitting: Podiatry

## 2014-11-25 DIAGNOSIS — B351 Tinea unguium: Secondary | ICD-10-CM

## 2014-11-25 DIAGNOSIS — M79676 Pain in unspecified toe(s): Secondary | ICD-10-CM | POA: Diagnosis not present

## 2014-11-25 DIAGNOSIS — Q828 Other specified congenital malformations of skin: Secondary | ICD-10-CM

## 2014-11-26 NOTE — Progress Notes (Signed)
Patient ID: Todd Spears, male   DOB: 1946/05/22, 69 y.o.   MRN: [id number]  Subjective: 69 y.o.-year-old male returns the office today for painful, elongated, thickened toenails which he cannot trim himself and for painful calluses. Denies any redness or drainage around the nails/calluses. Denies any acute changes since last appointment and no new complaints today. Denies any systemic complaints such as fevers, chills, nausea, vomiting.   Objective: AAO 3, NAD DP/PT pulses palpable, CRT less than 3 seconds Protective sensation intact with Simms Weinstein monofilament, Achilles tendon reflex intact.  Nails hypertrophic, dystrophic, elongated, brittle, discolored 10. There is tenderness overlying the nails 1-5 bilaterally. There is no surrounding erythema or drainage along the nail sites. Hyperkeratotic lesions bilateral submetatarsal 5. Upon debridement of the lesions no underlying ulceration, drainage or other clinical signs of infection. No open lesions or pre-ulcerative lesions are identified. No other areas of tenderness bilateral lower extremities. No overlying edema, erythema, increased warmth. No pain with calf compression, swelling, warmth, erythema.  Assessment: Patient presents with symptomatic onychomycosis; hyperkeratotic lesions  Plan: -Treatment options including alternatives, risks, complications were discussed -Nails sharply debrided 10 without complication/bleeding. -Hyperkeratotic lesions debrided 2 without complication/bleeding -Discussed daily foot inspection. If there are any changes, to call the office immediately.  -Follow-up in 3 months or sooner if any problems are to arise. In the meantime, encouraged to call the office with any questions, concerns, changes symptoms.  Celesta Gentile, DPM

## 2015-02-13 ENCOUNTER — Encounter: Payer: Self-pay | Admitting: Cardiology

## 2015-02-13 ENCOUNTER — Ambulatory Visit (INDEPENDENT_AMBULATORY_CARE_PROVIDER_SITE_OTHER): Payer: BLUE CROSS/BLUE SHIELD | Admitting: Cardiology

## 2015-02-13 VITALS — BP 130/66 | HR 59

## 2015-02-13 DIAGNOSIS — I10 Essential (primary) hypertension: Secondary | ICD-10-CM | POA: Diagnosis not present

## 2015-02-13 DIAGNOSIS — E785 Hyperlipidemia, unspecified: Secondary | ICD-10-CM

## 2015-02-13 DIAGNOSIS — I2510 Atherosclerotic heart disease of native coronary artery without angina pectoris: Secondary | ICD-10-CM

## 2015-02-13 NOTE — Patient Instructions (Signed)
Continue your current therapy and lifestyle modifications  I will see you in 6 months

## 2015-02-13 NOTE — Progress Notes (Signed)
Todd Spears Date of Birth: 04/22/1946 Medical Record #[id number]  History of Present Illness: Todd Spears is seen back today for follow up CAD. He is s/p 2 vessel PCI with Promus DES for 90% mid LAD and 95% mid RCA disease on 04/06/2012. His EF is normal at 55%. At that time he really had no significant anginal symptoms but had an abnormal stress test. Last Myoview in Nov. 2014 showed a fixed inferior defect without ischemia. Other issues include CKD, HTN, obesity, gout,,OA, ED, carotid bruit and asthma.  On followup today he continues to do well from a cardiac standpoint. He has made significant lifestyle modifications and has lost 8 lbs. He is walking one hour a day. No chest pain or SOB. His blood pressure has been well controlled at home. He is intolerant of statins even 5 mg of Crestor 3 days a week.     Current Outpatient Prescriptions on File Prior to Visit  Medication Sig Dispense Refill  . albuterol (PROVENTIL HFA;VENTOLIN HFA) 108 (90 BASE) MCG/ACT inhaler Inhale 2 puffs into the lungs every 6 (six) hours as needed. For shortness of breath 2 Inhaler 1  . amLODipine (NORVASC) 10 MG tablet Take 1 tablet (10 mg total) by mouth daily. 100 tablet 3  . aspirin 81 MG chewable tablet Chew 81 mg by mouth daily.    Marland Kitchen COLCRYS 0.6 MG tablet Take 0.6 mg by mouth daily.     . Cyanocobalamin (VITAMIN B 12 PO) Take 1 tablet by mouth daily.     Marland Kitchen esomeprazole (NEXIUM) 40 MG capsule TAKE 1 CAPSULE DAILY BEFORE BREAKFAST 100 capsule 3  . FeFum-FePoly-FA-B Cmp-C-Biot (INTEGRA PLUS) CAPS Take 1 tablet by mouth daily.    . halobetasol (ULTRAVATE) 0.05 % cream Apply topically 2 (two) times daily. 50 g 4  . hydrALAZINE (APRESOLINE) 25 MG tablet Take 1 tablet (25 mg total) by mouth 3 (three) times daily. 270 tablet 3  . lisinopril (PRINIVIL,ZESTRIL) 20 MG tablet Take 1 tablet (20 mg total) by mouth 2 (two) times daily. 200 tablet 3  . loratadine (CLARITIN) 10 MG tablet Take 10 mg by mouth daily.    . metoprolol  succinate (TOPROL-XL) 50 MG 24 hr tablet TAKE 1 TABLET DAILY WITH OR IMMEDIATELY FOLLOWING A MEAL 100 tablet 3  . mometasone (NASONEX) 50 MCG/ACT nasal spray Place 2 sprays into the nose as needed. 17 g 11  . Multiple Vitamins-Minerals (VISION-VITE PRESERVE PO) Take 1 tablet by mouth daily.    . nitroGLYCERIN (NITROSTAT) 0.4 MG SL tablet Place 1 tablet (0.4 mg total) under the tongue every 5 (five) minutes as needed for chest pain (up to 3 doses. not within 24hrs of Viagra). 25 tablet 6  . traMADol (ULTRAM) 50 MG tablet One half to one tablet 3 times daily when necessary for pain 100 tablet 3  . ULORIC 80 MG TABS Take 1 tablet by mouth daily.     No current facility-administered medications on file prior to visit.    Allergies  Allergen Reactions  . Crestor [Rosuvastatin Calcium]     myalgia  . Prednisone     REACTION: swelling, SOB    Past Medical History  Diagnosis Date  . Allergic rhinitis   . GERD (gastroesophageal reflux disease)   . Hyperlipidemia   . Hypertension   . Osteoarthritis   . ED (erectile dysfunction)   . Carotid bruit     r  . Renal insufficiency   . Asthma   . Obesity   .  CAD (coronary artery disease)     a. s/p PTCA/stenting of the mid LAD and mid RCA 04/06/12  . Aortic stenosis     a. Mild by echo 03/24/12.  Marland Kitchen Anemia     Past Surgical History  Procedure Laterality Date  . Nm pet lymphoma      left side of neck 1999  . Cardiac stents Bilateral   . Percutaneous coronary stent intervention (pci-s) N/A 04/06/2012    Procedure: PERCUTANEOUS CORONARY STENT INTERVENTION (PCI-S);  Surgeon: Peter M Martinique, MD;  Location: [facility] CATH LAB;  Service: Cardiovascular;  Laterality: N/A;    History  Smoking status  . Former Smoker  Smokeless tobacco  . Never Used    History  Alcohol Use No    Family History  Problem Relation Age of Onset  . Hypertension    . Stroke    . Coronary artery disease Father     in his 60s  . Heart disease Father   . Heart  attack Father     Review of Systems: The review of systems is per the HPI.  All other systems were reviewed and are negative.  Physical Exam: BP 130/66 mmHg  Pulse 59 Patient is an obese WM in no acute distress. Skin is warm and dry. Color is normal.  HEENT is unremarkable. Normocephalic/atraumatic. PERRL. Sclera are nonicteric. Neck is supple. No masses. No JVD. Lungs are clear. Cardiac exam shows a regular rate and rhythm. normal S1-2. No gallop or murmur. Abdomen is soft. Extremities are without edema. Gait and ROM are intact. No gross neurologic deficits noted.  LABORATORY DATA: Lab Results  Component Value Date   WBC 7.1 06/14/2014   HGB 12.2* 06/14/2014   HCT 36.9* 06/14/2014   PLT 220.0 06/14/2014   GLUCOSE 110* 06/14/2014   CHOL 180 06/14/2014   TRIG 98.0 06/14/2014   HDL 51.30 06/14/2014   LDLDIRECT 164.9 12/10/2010   LDLCALC 109* 06/14/2014   ALT 24 06/14/2014   AST 26 06/14/2014   NA 137 06/14/2014   K 5.0 06/14/2014   CL 104 06/14/2014   CREATININE 1.1 06/14/2014   BUN 18 06/14/2014   CO2 29 06/14/2014   TSH 2.20 06/14/2014   PSA 0.58 06/14/2014   INR 1.0 04/03/2012    Ecg today shows sinus brady with rate 59 bpm. Otherwise normal. I have personally reviewed and interpreted this study.  Assessment / Plan:  1. CAD - status post 2 vessel PCI with DES in November 2013- doing well. Continue current therapy with aspirin.  Subsequent Myoview study in Nov 2014 was low risk with a small area of inferobasal scar and no ischemia. We will continue medical therapy and risk factor modification. Plan repeat stress test in one year.  2. HTN - blood pressure is well controlled. Will monitor on current therapy.  3. Anemia   4. CKD - followed by nephrology.  5. Hyperlipidemia. Intolerant of statins. Continue with  dietary modification and weight loss.

## 2015-02-24 ENCOUNTER — Ambulatory Visit (INDEPENDENT_AMBULATORY_CARE_PROVIDER_SITE_OTHER): Payer: BLUE CROSS/BLUE SHIELD | Admitting: Podiatry

## 2015-02-24 ENCOUNTER — Encounter: Payer: Self-pay | Admitting: Podiatry

## 2015-02-24 DIAGNOSIS — B351 Tinea unguium: Secondary | ICD-10-CM | POA: Diagnosis not present

## 2015-02-24 DIAGNOSIS — Q828 Other specified congenital malformations of skin: Secondary | ICD-10-CM

## 2015-02-24 DIAGNOSIS — M79676 Pain in unspecified toe(s): Secondary | ICD-10-CM

## 2015-02-25 NOTE — Progress Notes (Signed)
Patient ID: Todd Spears, male   DOB: September 30, 1945, 69 y.o.   MRN: [id number] Complaint:  Visit Type: Patient returns to my office for continued preventative foot care services. Complaint: Patient states" my nails have grown long and thick and become painful to walk and wear shoes" Patient has been diagnosed with DM with no foot complications. The patient presents for preventative foot care services. No changes to ROS.  Painful callus under the outside ball of both feet.  Podiatric Exam: Vascular: dorsalis pedis and posterior tibial pulses are palpable bilateral. Capillary return is immediate. Temperature gradient is WNL. Skin turgor WNL  Sensorium: Normal Semmes Weinstein monofilament test. Normal tactile sensation bilaterally. Nail Exam: Pt has thick disfigured discolored nails with subungual debris noted bilateral entire nail hallux through fifth toenails Ulcer Exam: There is no evidence of ulcer or pre-ulcerative changes or infection. Orthopedic Exam: Muscle tone and strength are WNL. No limitations in general ROM. No crepitus or effusions noted. Foot type and digits show no abnormalities. Bony prominences are unremarkable. Skin:  Porokeratosis sub 5th metatarsal head both feet.. No infection or ulcers  Diagnosis:  Onychomycosis, , Pain in right toe, pain in left toes. Porokeratosis B/L  Treatment & Plan Procedures and Treatment: Consent by patient was obtained for treatment procedures. The patient understood the discussion of treatment and procedures well. All questions were answered thoroughly reviewed. Debridement of mycotic and hypertrophic toenails, 1 through 5 bilateral and clearing of subungual debris. No ulceration, no infection noted. Debride porokeratosis Return Visit-Office Procedure: Patient instructed to return to the office for a follow up visit 3 months for continued evaluation and treatment.

## 2015-03-03 ENCOUNTER — Ambulatory Visit: Payer: BLUE CROSS/BLUE SHIELD | Admitting: Cardiology

## 2015-03-06 ENCOUNTER — Other Ambulatory Visit: Payer: Self-pay | Admitting: Cardiology

## 2015-03-06 NOTE — Telephone Encounter (Signed)
Rx request sent to pharmacy.  

## 2015-03-14 ENCOUNTER — Encounter: Payer: Self-pay | Admitting: Family Medicine

## 2015-03-14 ENCOUNTER — Ambulatory Visit (INDEPENDENT_AMBULATORY_CARE_PROVIDER_SITE_OTHER): Payer: BLUE CROSS/BLUE SHIELD | Admitting: Family Medicine

## 2015-03-14 VITALS — BP 140/72 | HR 60 | Temp 97.8°F | Ht 67.0 in | Wt 208.0 lb

## 2015-03-14 DIAGNOSIS — Z23 Encounter for immunization: Secondary | ICD-10-CM

## 2015-03-14 DIAGNOSIS — L821 Other seborrheic keratosis: Secondary | ICD-10-CM | POA: Diagnosis not present

## 2015-03-14 NOTE — Patient Instructions (Signed)
Set up a time in January to transfer your care to  [facility]

## 2015-03-14 NOTE — Progress Notes (Signed)
   Subjective:    Patient ID: Todd Spears, male    DOB: 1945-09-10, 69 y.o.   MRN: [id number]  HPI Todd Spears is a 69 year old married male nonsmoker who comes in today for evaluation of a lesion on his scalp  His wife noticed a lesion on his scalp and advise recommend he get checked  He's never had a history of any skin cancer he has had dark eyes and somewhat dark skin.  He's also needs a flu shot.  Last physical January 2016. He wishes to switch to [employer]   Review of Systems Review of systems negative    Objective:   Physical Exam  Well-developed well-nourished male no acute distress vital signs stable he is afebrile examination scalp shows a 5 mm lesion it's brown and peels off easily consistent with a seborrheic keratosis.      Assessment & Plan:  Seborrheic keratosis,,,,,,,,,, reassured  Flu shot given

## 2015-05-02 ENCOUNTER — Ambulatory Visit: Payer: BLUE CROSS/BLUE SHIELD | Admitting: Adult Health

## 2015-05-05 ENCOUNTER — Ambulatory Visit: Payer: BLUE CROSS/BLUE SHIELD | Admitting: Adult Health

## 2015-05-18 ENCOUNTER — Encounter: Payer: Self-pay | Admitting: Podiatry

## 2015-05-18 ENCOUNTER — Ambulatory Visit (INDEPENDENT_AMBULATORY_CARE_PROVIDER_SITE_OTHER): Payer: BLUE CROSS/BLUE SHIELD | Admitting: Podiatry

## 2015-05-18 DIAGNOSIS — B351 Tinea unguium: Secondary | ICD-10-CM

## 2015-05-18 DIAGNOSIS — M79676 Pain in unspecified toe(s): Secondary | ICD-10-CM | POA: Diagnosis not present

## 2015-05-18 DIAGNOSIS — Q828 Other specified congenital malformations of skin: Secondary | ICD-10-CM

## 2015-05-18 NOTE — Progress Notes (Signed)
Patient ID: Todd Spears, male   DOB: [birth year], 69 y.o.   MRN: [id number] Complaint:  Visit Type: Patient returns to my office for continued preventative foot care services. Complaint: Patient states" my nails have grown long and thick and become painful to walk and wear shoes" Patient has been diagnosed with DM with no foot complications. The patient presents for preventative foot care services. No changes to ROS.  Painful callus under the outside ball of both feet.  Podiatric Exam: Vascular: dorsalis pedis and posterior tibial pulses are palpable bilateral. Capillary return is immediate. Temperature gradient is WNL. Skin turgor WNL  Sensorium: Normal Semmes Weinstein monofilament test. Normal tactile sensation bilaterally. Nail Exam: Pt has thick disfigured discolored nails with subungual debris noted bilateral entire nail hallux through fifth toenails Ulcer Exam: There is no evidence of ulcer or pre-ulcerative changes or infection. Orthopedic Exam: Muscle tone and strength are WNL. No limitations in general ROM. No crepitus or effusions noted. Foot type and digits show no abnormalities. Bony prominences are unremarkable. Skin:  Porokeratosis sub 5th metatarsal head both feet.. No infection or ulcers  Diagnosis:  Onychomycosis, , Pain in right toe, pain in left toes. Porokeratosis B/L  Treatment & Plan Procedures and Treatment: Consent by patient was obtained for treatment procedures. The patient understood the discussion of treatment and procedures well. All questions were answered thoroughly reviewed. Debridement of mycotic and hypertrophic toenails, 1 through 5 bilateral and clearing of subungual debris. No ulceration, no infection noted. Debride porokeratosis Return Visit-Office Procedure: Patient instructed to return to the office for a follow up visit 3 months for continued evaluation and treatment.  Gardiner Barefoot DPM

## 2015-05-23 ENCOUNTER — Encounter: Payer: Self-pay | Admitting: Family Medicine

## 2015-05-23 ENCOUNTER — Ambulatory Visit (INDEPENDENT_AMBULATORY_CARE_PROVIDER_SITE_OTHER): Payer: BLUE CROSS/BLUE SHIELD | Admitting: Family Medicine

## 2015-05-23 VITALS — BP 140/70 | Temp 98.2°F | Wt 212.0 lb

## 2015-05-23 DIAGNOSIS — J309 Allergic rhinitis, unspecified: Secondary | ICD-10-CM

## 2015-05-23 MED ORDER — MOMETASONE FUROATE 50 MCG/ACT NA SUSP: 2.0000 | NASAL | Status: DC | PRN

## 2015-05-23 MED ORDER — MONTELUKAST SODIUM 10 MG PO TABS: 10.0000 mg | ORAL_TABLET | Freq: Every day | ORAL | Status: DC

## 2015-05-23 NOTE — Progress Notes (Signed)
Pre visit review using our clinic review tool, if applicable. No additional management support is needed unless otherwise documented below in the visit note. 

## 2015-05-23 NOTE — Progress Notes (Signed)
   Subjective:    Patient ID: Todd Spears, male    DOB: 08-Jan-1946, 69 y.o.   MRN: [id number]  HPI Todd Spears is a 69 year old male nonsmoker who comes in today for evaluation of allergic rhinitis  He's had a long-standing history of allergic rhinitis and occasional asthma when he gets a bad viral infection. His current treatment program is Claritin plain or Zyrtec plain daily along with some saline nasal spray. He's out of the steroid nasal spray. He was informed that it's over-the-counter now.  He's had no trouble breathing.  He's never seen an allergist  He tells me he is allergic to prednisone   Review of Systems Review of systems otherwise negative    Objective:   Physical Exam  Well developed well-nourished male no acute distress vital signs stable he is afebrile HEENT were negative except for bilateral cerumen impactions. The nose exam shows the septum in the midline turbinates are edematous no polyps visualized      Assessment & Plan:  Allergic rhinitis......... outlined game plan see orders and instructions........... allergy evaluation if symptoms don't improve with conservative therapy  Bilateral cerumen impactions,,,,,,,,,,,,, earwax kit

## 2015-05-23 NOTE — Patient Instructions (Addendum)
Steroid nasal spray.........Marland Kitchen 1 shot up each nostril twice daily  Before using the steroid nasal spray at bedtime,,,,,,,,, irrigate with a saline nasal spray  Singulair 10 mg,,,,,,,,,,, one daily at bedtime  Plain Claritin 10 mg,,,,,,,,,,,,,, one in the morning  Plain Zyrtec 10 mg,,,,,,,,,, 1 at bedtime  If the above does not alleviate her symptoms then I would recommend an allergy evaluation by Dr. Duanne Limerick or [place].......Marland Kitchen or 2 new adult nurse practitioners or Dr. Martinique

## 2015-05-30 ENCOUNTER — Ambulatory Visit: Payer: BLUE CROSS/BLUE SHIELD | Admitting: Physician Assistant

## 2015-06-02 ENCOUNTER — Other Ambulatory Visit: Payer: Self-pay | Admitting: Cardiology

## 2015-06-02 NOTE — Telephone Encounter (Signed)
Rx request sent to pharmacy.  

## 2015-06-14 ENCOUNTER — Other Ambulatory Visit: Payer: Self-pay | Admitting: Family Medicine

## 2015-06-16 ENCOUNTER — Other Ambulatory Visit: Payer: Self-pay | Admitting: Family Medicine

## 2015-06-23 ENCOUNTER — Telehealth: Payer: Self-pay | Admitting: Cardiology

## 2015-06-23 DIAGNOSIS — I10 Essential (primary) hypertension: Secondary | ICD-10-CM

## 2015-06-23 MED ORDER — CHLORTHALIDONE 25 MG PO TABS: 25.0000 mg | ORAL_TABLET | Freq: Every day | ORAL | Status: DC

## 2015-06-23 NOTE — Telephone Encounter (Signed)
Returned call to patient no answer.LMTC. 

## 2015-06-23 NOTE — Telephone Encounter (Signed)
Pt called in stating that this week his BP has been flucuating a lot but more so it has been elevated. He is also complaining or swollen ankles and headaches. Please f/u with him  Thanks

## 2015-06-23 NOTE — Telephone Encounter (Signed)
He is pretty well maxed out on lisinopril, amlodipine, and metoprolol. We could increase hydralazine but with swelling in his ankles I would favor starting chlorthalidone 25 mg daily in addition to his current meds. Needs a repeat BMET in 2 weeks.   Raelene Trew Martinique MD, [facility]

## 2015-06-23 NOTE — Addendum Note (Signed)
Addended by: Golden Hurter D on: 06/23/2015 06:12 PM   Modules accepted: Orders

## 2015-06-23 NOTE — Telephone Encounter (Signed)
Returned call to patient Dr.Jordan's recommendations given.Lab Order mailed to patient.Advised to call back if not better.

## 2015-06-23 NOTE — Telephone Encounter (Signed)
Returned call to patient.He stated for the past 3 weeks B/P up and down.B/P ranging 180/81,157/72,148/70,124/70 pulse in 50's.Stated he has swelling in both ankles and has had a slight headache.He eats no salt.Stated he wanted to ask Dr,Jordan if medication needs to be changed.Message sent to [place] for advice.

## 2015-06-29 ENCOUNTER — Telehealth: Payer: Self-pay | Admitting: Cardiology

## 2015-06-29 MED ORDER — HYDRALAZINE HCL 50 MG PO TABS: 50.0000 mg | ORAL_TABLET | Freq: Three times a day (TID) | ORAL | Status: DC

## 2015-06-29 NOTE — Telephone Encounter (Signed)
Returned call to patient.He stated since he started taking Chlorthalidone 25 mg daily he has been having muscle spasms and cramping in arms and legs.Message sent to [place] for advice.

## 2015-06-29 NOTE — Addendum Note (Signed)
Addended by: Golden Hurter D on: 06/29/2015 01:41 PM   Modules accepted: Orders, Medications

## 2015-06-29 NOTE — Telephone Encounter (Signed)
Returned call to patient.Dr.Jordan advised to stop chlorthalidone.Increase hydralazine to 50 mg three times a day.

## 2015-06-29 NOTE — Telephone Encounter (Signed)
New message     Pt c/o medication issue:  1. Name of Medication: chlorthalidone 2. How are you currently taking this medication (dosage and times per day)? 25mg  daily 3. Are you having a reaction (difficulty breathing--STAT)? no  4. What is your medication issue?  Started rx Monday----muscle spasms and cramps.  Pt cannot tolerate medication.  Please call

## 2015-06-29 NOTE — Telephone Encounter (Signed)
Stop chlorthalidone. Increase hydralazine to 50 mg tid for BP.  Mitchell Epling Martinique MD, [facility]

## 2015-07-03 ENCOUNTER — Inpatient Hospital Stay (HOSPITAL_COMMUNITY)
Admission: EM | Admit: 2015-07-03 | Discharge: 2015-07-06 | DRG: 644 | Disposition: A | Discharge status: Home / Self Care | Payer: BLUE CROSS/BLUE SHIELD | Attending: Internal Medicine | Admitting: Internal Medicine

## 2015-07-03 ENCOUNTER — Encounter (HOSPITAL_COMMUNITY): Payer: Self-pay | Admitting: *Deleted

## 2015-07-03 DIAGNOSIS — N179 Acute kidney failure, unspecified: Secondary | ICD-10-CM | POA: Diagnosis present

## 2015-07-03 DIAGNOSIS — I1310 Hypertensive heart and chronic kidney disease without heart failure, with stage 1 through stage 4 chronic kidney disease, or unspecified chronic kidney disease: Secondary | ICD-10-CM | POA: Diagnosis present

## 2015-07-03 DIAGNOSIS — N189 Chronic kidney disease, unspecified: Secondary | ICD-10-CM

## 2015-07-03 DIAGNOSIS — D72829 Elevated white blood cell count, unspecified: Secondary | ICD-10-CM | POA: Diagnosis present

## 2015-07-03 DIAGNOSIS — Z955 Presence of coronary angioplasty implant and graft: Secondary | ICD-10-CM

## 2015-07-03 DIAGNOSIS — D631 Anemia in chronic kidney disease: Secondary | ICD-10-CM | POA: Diagnosis present

## 2015-07-03 DIAGNOSIS — N182 Chronic kidney disease, stage 2 (mild): Secondary | ICD-10-CM | POA: Diagnosis present

## 2015-07-03 DIAGNOSIS — J309 Allergic rhinitis, unspecified: Secondary | ICD-10-CM

## 2015-07-03 DIAGNOSIS — E871 Hypo-osmolality and hyponatremia: Secondary | ICD-10-CM

## 2015-07-03 DIAGNOSIS — K219 Gastro-esophageal reflux disease without esophagitis: Secondary | ICD-10-CM | POA: Diagnosis present

## 2015-07-03 DIAGNOSIS — E222 Syndrome of inappropriate secretion of antidiuretic hormone: Secondary | ICD-10-CM | POA: Diagnosis not present

## 2015-07-03 DIAGNOSIS — E860 Dehydration: Secondary | ICD-10-CM | POA: Diagnosis present

## 2015-07-03 DIAGNOSIS — R251 Tremor, unspecified: Secondary | ICD-10-CM | POA: Diagnosis present

## 2015-07-03 DIAGNOSIS — I10 Essential (primary) hypertension: Secondary | ICD-10-CM | POA: Diagnosis present

## 2015-07-03 LAB — CBC WITH DIFFERENTIAL/PLATELET
BASOS ABS: 0.1 10*3/uL (ref 0.0–0.1)
BASOS PCT: 1 %
Eosinophils Absolute: 0.4 10*3/uL (ref 0.0–0.7)
Eosinophils Relative: 4 %
HEMATOCRIT: 35.4 % — AB (ref 39.0–52.0)
Hemoglobin: 12.4 g/dL — ABNORMAL LOW (ref 13.0–17.0)
LYMPHS PCT: 29 %
Lymphs Abs: 3.2 10*3/uL (ref 0.7–4.0)
MCH: 30 pg (ref 26.0–34.0)
MCHC: 35 g/dL (ref 30.0–36.0)
MCV: 85.5 fL (ref 78.0–100.0)
Monocytes Absolute: 1.1 10*3/uL — ABNORMAL HIGH (ref 0.1–1.0)
Monocytes Relative: 10 %
NEUTROS ABS: 6.3 10*3/uL (ref 1.7–7.7)
Neutrophils Relative %: 56 %
PLATELETS: 293 10*3/uL (ref 150–400)
RBC: 4.14 MIL/uL — AB (ref 4.22–5.81)
RDW: 12.2 % (ref 11.5–15.5)
WBC: 11.2 10*3/uL — AB (ref 4.0–10.5)

## 2015-07-03 LAB — COMPREHENSIVE METABOLIC PANEL
ALBUMIN: 4.3 g/dL (ref 3.5–5.0)
ALT: 25 U/L (ref 17–63)
AST: 30 U/L (ref 15–41)
Alkaline Phosphatase: 60 U/L (ref 38–126)
Anion gap: 14 (ref 5–15)
BILIRUBIN TOTAL: 0.6 mg/dL (ref 0.3–1.2)
BUN: 18 mg/dL (ref 6–20)
CHLORIDE: 87 mmol/L — AB (ref 101–111)
CO2: 20 mmol/L — ABNORMAL LOW (ref 22–32)
CREATININE: 1.48 mg/dL — AB (ref 0.61–1.24)
Calcium: 9.4 mg/dL (ref 8.9–10.3)
GFR calc Af Amer: 54 mL/min — ABNORMAL LOW (ref >60)
GFR, EST NON AFRICAN AMERICAN: 46 mL/min — AB (ref >60)
GLUCOSE: 140 mg/dL — AB (ref 65–99)
Potassium: 4.7 mmol/L (ref 3.5–5.1)
Sodium: 121 mmol/L — ABNORMAL LOW (ref 135–145)
Total Protein: 7.3 g/dL (ref 6.5–8.1)

## 2015-07-03 LAB — I-STAT CHEM 8, ED
BUN: 21 mg/dL — ABNORMAL HIGH (ref 6–20)
Calcium, Ion: 1.09 mmol/L — ABNORMAL LOW (ref 1.13–1.30)
Chloride: 88 mmol/L — ABNORMAL LOW (ref 101–111)
Creatinine, Ser: 1.5 mg/dL — ABNORMAL HIGH (ref 0.61–1.24)
Glucose, Bld: 136 mg/dL — ABNORMAL HIGH (ref 65–99)
HEMATOCRIT: 42 % (ref 39.0–52.0)
HEMOGLOBIN: 14.3 g/dL (ref 13.0–17.0)
POTASSIUM: 4.5 mmol/L (ref 3.5–5.1)
SODIUM: 121 mmol/L — AB (ref 135–145)
TCO2: 20 mmol/L (ref 0–100)

## 2015-07-03 LAB — MAGNESIUM: MAGNESIUM: 1.7 mg/dL (ref 1.7–2.4)

## 2015-07-03 MED ORDER — SODIUM CHLORIDE 0.9 % IV SOLN
INTRAVENOUS | Status: DC
  Administered 2015-07-03: 23:00:00 via INTRAVENOUS

## 2015-07-03 MED ORDER — MONTELUKAST SODIUM 10 MG PO TABS
10.0000 mg | ORAL_TABLET | Freq: Every day | ORAL | Status: DC
  Administered 2015-07-04 – 2015-07-05 (×3): 10 mg via ORAL
  Filled 2015-07-03 (×3): qty 1

## 2015-07-03 MED ORDER — ASPIRIN 81 MG PO CHEW
81.0000 mg | CHEWABLE_TABLET | Freq: Every day | ORAL | Status: DC
  Administered 2015-07-04 – 2015-07-06 (×3): 81 mg via ORAL
  Filled 2015-07-03 (×3): qty 1

## 2015-07-03 MED ORDER — SENNOSIDES-DOCUSATE SODIUM 8.6-50 MG PO TABS: 1.0000 | ORAL_TABLET | Freq: Every evening | ORAL | Status: DC | PRN

## 2015-07-03 MED ORDER — FE FUMARATE-B12-VIT C-FA-IFC PO CAPS
1.0000 | ORAL_CAPSULE | Freq: Every day | ORAL | Status: DC
  Administered 2015-07-04 – 2015-07-06 (×3): 1 via ORAL
  Filled 2015-07-03 (×3): qty 1

## 2015-07-03 MED ORDER — SODIUM CHLORIDE 0.9% FLUSH
3.0000 mL | Freq: Two times a day (BID) | INTRAVENOUS | Status: DC
  Administered 2015-07-04: 3 mL via INTRAVENOUS
  Administered 2015-07-04: 10 mL via INTRAVENOUS
  Administered 2015-07-05 – 2015-07-06 (×3): 3 mL via INTRAVENOUS

## 2015-07-03 MED ORDER — LORATADINE 10 MG PO TABS
10.0000 mg | ORAL_TABLET | Freq: Every day | ORAL | Status: DC
  Administered 2015-07-04 – 2015-07-06 (×3): 10 mg via ORAL
  Filled 2015-07-03 (×3): qty 1

## 2015-07-03 MED ORDER — ACETAMINOPHEN 325 MG PO TABS: 650.0000 mg | ORAL_TABLET | Freq: Four times a day (QID) | ORAL | Status: DC | PRN

## 2015-07-03 MED ORDER — ONDANSETRON HCL 4 MG/2ML IJ SOLN: 4.0000 mg | Freq: Four times a day (QID) | INTRAMUSCULAR | Status: DC | PRN

## 2015-07-03 MED ORDER — ACETAMINOPHEN 650 MG RE SUPP: 650.0000 mg | Freq: Four times a day (QID) | RECTAL | Status: DC | PRN

## 2015-07-03 MED ORDER — SALINE SPRAY 0.65 % NA SOLN: 1.0000 | Freq: Every day | NASAL | Status: DC | PRN

## 2015-07-03 MED ORDER — ALBUTEROL SULFATE HFA 108 (90 BASE) MCG/ACT IN AERS: 2.0000 | INHALATION_SPRAY | RESPIRATORY_TRACT | Status: DC | PRN

## 2015-07-03 MED ORDER — ADULT MULTIVITAMIN W/MINERALS CH
1.0000 | ORAL_TABLET | Freq: Every day | ORAL | Status: DC
  Administered 2015-07-04 – 2015-07-06 (×3): 1 via ORAL
  Filled 2015-07-03 (×3): qty 1

## 2015-07-03 MED ORDER — HYDROCODONE-ACETAMINOPHEN 5-325 MG PO TABS: 1.0000 | ORAL_TABLET | ORAL | Status: DC | PRN

## 2015-07-03 MED ORDER — NITROGLYCERIN 0.4 MG SL SUBL: 0.4000 mg | SUBLINGUAL_TABLET | SUBLINGUAL | Status: DC | PRN

## 2015-07-03 MED ORDER — SODIUM CHLORIDE 0.9 % IV SOLN
INTRAVENOUS | Status: DC
  Administered 2015-07-04: 01:00:00 via INTRAVENOUS

## 2015-07-03 MED ORDER — TURMERIC 500 MG PO CAPS: 500.0000 mg | ORAL_CAPSULE | Freq: Every day | ORAL | Status: DC

## 2015-07-03 MED ORDER — METOPROLOL SUCCINATE ER 50 MG PO TB24
50.0000 mg | ORAL_TABLET | Freq: Every day | ORAL | Status: DC
  Administered 2015-07-05 – 2015-07-06 (×2): 50 mg via ORAL
  Filled 2015-07-03 (×3): qty 1

## 2015-07-03 MED ORDER — PANTOPRAZOLE SODIUM 40 MG PO TBEC
40.0000 mg | DELAYED_RELEASE_TABLET | Freq: Every day | ORAL | Status: DC
  Administered 2015-07-04 – 2015-07-06 (×3): 40 mg via ORAL
  Filled 2015-07-03 (×3): qty 1

## 2015-07-03 MED ORDER — HYDRALAZINE HCL 25 MG PO TABS
25.0000 mg | ORAL_TABLET | Freq: Three times a day (TID) | ORAL | Status: DC
  Administered 2015-07-04 – 2015-07-05 (×5): 25 mg via ORAL
  Filled 2015-07-03 (×7): qty 1

## 2015-07-03 MED ORDER — FEBUXOSTAT 40 MG PO TABS
80.0000 mg | ORAL_TABLET | Freq: Every day | ORAL | Status: DC
  Administered 2015-07-04 – 2015-07-06 (×3): 80 mg via ORAL
  Filled 2015-07-03 (×3): qty 2

## 2015-07-03 MED ORDER — ONDANSETRON HCL 4 MG PO TABS: 4.0000 mg | ORAL_TABLET | Freq: Four times a day (QID) | ORAL | Status: DC | PRN

## 2015-07-03 MED ORDER — BISACODYL 5 MG PO TBEC: 5.0000 mg | DELAYED_RELEASE_TABLET | Freq: Every day | ORAL | Status: DC | PRN

## 2015-07-03 MED ORDER — SODIUM CHLORIDE 0.9 % IV BOLUS (SEPSIS): 500.0000 mL | Freq: Once | INTRAVENOUS | Status: DC

## 2015-07-03 MED ORDER — HEPARIN SODIUM (PORCINE) 5000 UNIT/ML IJ SOLN
5000.0000 [IU] | Freq: Three times a day (TID) | INTRAMUSCULAR | Status: DC
  Administered 2015-07-04 – 2015-07-06 (×8): 5000 [IU] via SUBCUTANEOUS
  Filled 2015-07-03 (×8): qty 1

## 2015-07-03 MED ORDER — AMLODIPINE BESYLATE 10 MG PO TABS
10.0000 mg | ORAL_TABLET | Freq: Every day | ORAL | Status: DC
  Administered 2015-07-04 – 2015-07-06 (×3): 10 mg via ORAL
  Filled 2015-07-03 (×3): qty 1

## 2015-07-03 MED ORDER — LORAZEPAM 2 MG/ML IJ SOLN
1.0000 mg | Freq: Once | INTRAMUSCULAR | Status: AC
  Administered 2015-07-03: 1 mg via INTRAVENOUS
  Filled 2015-07-03: qty 1

## 2015-07-03 NOTE — H&P (Signed)
[facility] History and Physical  Todd Spears [id number] DOB: 1945/10/19 DOA: 07/03/2015  Referring physician: ED physician PCP: Joycelyn Man, MD  Specialists:  Dr. Martinique, cardiology   Chief Complaint:  Tremors   HPI: Todd Spears is a 70 y.o. male with PMH of CAD with stents to LAD and RCA, asthma, and hypertension who presents to the ED with approximately 1 week of worsening tremors. Patient is on numerous antihypertensives with Spears pressure difficult to control, and was just started on chlorthalidone by his cardiologist on 06/23/2015. Approximately 2 days later, patient began to experience tremors in all extremities as well as his face. He spoke with his cardiologist late last week and was advised to stop chlorthalidone, but the tremors have persisted, maybe even worsened. Patient reports accompanying fatigue, but denies subjective fevers, chills, chest pain, palpitations, cough, or dysuria. He denies abdominal pain, nausea, vomiting, or constipation, but reports one loose stool earlier today. His tremors became particularly severe this evening, prompting his wife to activate EMS. He reportedly had a temperature of 101F in the field, with vital signs otherwise normal.  In ED, patient was found to be afebrile (rectal), saturating well on room air, and with vital signs stable. Initial Spears work revealed a serum sodium of 121 with chloride of 88. Serum creatinine is 1.5, up from an apparent baseline of 1.1. There is a mild normocytic anemia on CBC and a leukocytosis to 11,400 is present. Patient was started on normal saline infusion, remained stable in the ED, and will be admitted for ongoing evaluation and management of hyponatremia.   Where does patient live?   At home     Can patient participate in ADLs?  Yes         Review of Systems:   General:  Fatigue. no fevers, chills, sweats, weight change, poor appetite  HEENT: no blurry vision, hearing changes or sore  throat Pulm: no dyspnea, cough, or wheeze CV: no chest pain or palpitations Abd: no nausea, vomiting, abdominal pain, diarrhea, or constipation GU: no dysuria, hematuria, increased urinary frequency, or urgency  Ext: no leg edema Neuro: no focal weakness, numbness, or tingling, no vision change or hearing loss. Diffuse tremors Skin: no rash, no wounds MSK: No muscle spasm, no deformity, no red, hot, or swollen joint Heme: No easy bruising or bleeding Travel history: No recent long distant travel    Allergy:  Allergies  Allergen Reactions  . Prednisone Shortness Of Breath and Swelling  . Crestor [Rosuvastatin Calcium] Other (See Comments)    myalgia    Past Medical History  Diagnosis Date  . Allergic rhinitis   . GERD (gastroesophageal reflux disease)   . Hyperlipidemia   . Hypertension   . Osteoarthritis   . ED (erectile dysfunction)   . Carotid bruit     r  . Renal insufficiency   . Asthma   . Obesity   . CAD (coronary artery disease)     a. s/p PTCA/stenting of the mid LAD and mid RCA 04/06/12  . Aortic stenosis     a. Mild by echo 03/24/12.  Marland Kitchen Anemia     Past Surgical History  Procedure Laterality Date  . Nm pet lymphoma      left side of neck 1999  . Cardiac stents Bilateral   . Percutaneous coronary stent intervention (pci-s) N/A 04/06/2012    Procedure: PERCUTANEOUS CORONARY STENT INTERVENTION (PCI-S);  Surgeon: Peter M Martinique, MD;  Location: [facility] CATH LAB;  Service: Cardiovascular;  Laterality: N/A;    Social History:  reports that he has quit smoking. He has never used smokeless tobacco. He reports that he does not drink alcohol or use illicit drugs.  Family History:  Family History  Problem Relation Age of Onset  . Hypertension    . Stroke    . Coronary artery disease Father     in his 57s  . Heart disease Father   . Heart attack Father      Prior to Admission medications   Medication Sig Start Date End Date Taking? Authorizing Provider  albuterol  (PROVENTIL HFA;VENTOLIN HFA) 108 (90 BASE) MCG/ACT inhaler Inhale 2 puffs into the lungs every 6 (six) hours as needed. For shortness of breath 09/15/12  Yes Dorena Cookey, MD  amLODipine (NORVASC) 10 MG tablet Take 1 tablet (10 mg total) by mouth daily. 06/16/14  Yes Dorena Cookey, MD  aspirin 81 MG chewable tablet Chew 81 mg by mouth daily.   Yes Historical Provider, MD  COLCRYS 0.6 MG tablet Take 0.6 mg by mouth daily as needed (for gout flareups).  04/09/12  Yes Historical Provider, MD  Cyanocobalamin (VITAMIN B 12 PO) Take 1 tablet by mouth daily.    Yes Historical Provider, MD  esomeprazole (NEXIUM) 40 MG capsule TAKE 1 CAPSULE DAILY BEFORE BREAKFAST 06/16/15  Yes Dorena Cookey, MD  FeFum-FePoly-FA-B Cmp-C-Biot (INTEGRA PLUS) CAPS Take 1 tablet by mouth daily. 07/04/14  Yes Historical Provider, MD  hydrALAZINE (APRESOLINE) 50 MG tablet Take 1 tablet (50 mg total) by mouth 3 (three) times daily. 06/29/15  Yes Peter M Martinique, MD  lisinopril (PRINIVIL,ZESTRIL) 20 MG tablet TAKE 1 TABLET TWICE A DAY 06/16/15  Yes Dorena Cookey, MD  loratadine (CLARITIN) 10 MG tablet Take 10 mg by mouth daily.   Yes Historical Provider, MD  metoprolol succinate (TOPROL-XL) 50 MG 24 hr tablet TAKE 1 TABLET DAILY WITH OR IMMEDIATELY FOLLOWING A MEAL 06/16/14  Yes Dorena Cookey, MD  montelukast (SINGULAIR) 10 MG tablet Take 1 tablet (10 mg total) by mouth at bedtime. 05/23/15  Yes Dorena Cookey, MD  Multiple Vitamin (MULTIVITAMIN WITH MINERALS) TABS tablet Take 1 tablet by mouth daily.   Yes Historical Provider, MD  Multiple Vitamins-Minerals (VISION-VITE PRESERVE PO) Take 1 tablet by mouth daily.   Yes Historical Provider, MD  Saline (SIMPLY SALINE) 0.9 % AERS Place 1 spray into the nose daily as needed (at bedtime).   Yes Historical Provider, MD  TURMERIC PO Take 500 mg by mouth daily.   Yes Historical Provider, MD  ULORIC 80 MG TABS Take 1 tablet by mouth daily. 07/25/14  Yes Historical Provider, MD  chlorthalidone  (HYGROTON) 25 MG tablet Take 25 mg by mouth daily. Reported on 07/03/2015 06/23/15   Historical Provider, MD  halobetasol (ULTRAVATE) 0.05 % cream Apply topically 2 (two) times daily. 12/17/10   Dorena Cookey, MD  mometasone (NASONEX) 50 MCG/ACT nasal spray Place 2 sprays into the nose as needed. 05/23/15   Dorena Cookey, MD  nitroGLYCERIN (NITROSTAT) 0.4 MG SL tablet Place 1 tablet (0.4 mg total) under the tongue every 5 (five) minutes as needed for chest pain (up to 3 doses. not within 24hrs of Viagra). 11/21/14   Peter M Martinique, MD  traMADol Veatrice Bourbon) 50 MG tablet One half to one tablet 3 times daily when necessary for pain 03/04/13   Dorena Cookey, MD    Physical Exam: Filed Vitals:   07/03/15 2215 07/03/15 2230 07/03/15 2237  07/03/15 2245  BP: 150/66 144/58  147/62  Pulse: 61 63  63  Temp:   98.5 F (36.9 C)   TempSrc:   Rectal   Resp: 13 16  16   Height:      Weight:      SpO2: 97% 98%  99%   General: Not in acute distress HEENT:       Eyes: PERRL, EOMI, no scleral icterus or conjunctival pallor.       ENT: No discharge from the ears or nose, no pharyngeal ulcers, petechiae or exudate, no tonsillar enlargement.        Neck: No JVD, no bruit, no appreciable mass Heme: No cervical adenopathy, no pallor Cardiac: S1/S2, RRR, No murmurs, No gallops or rubs. Pulm: Good air movement bilaterally. No rales, wheezing, rhonchi or rubs. Abd: Soft, nondistended, nontender, no rebound pain or gaurding, no mass or organomegaly, BS present. Ext: No LE edema bilaterally. 2+DP/PT pulse bilaterally. Musculoskeletal: No gross deformity, no red, hot, swollen joints   Skin: No rashes or wounds on exposed surfaces  Neuro: Alert, oriented X3, cranial nerves II-XII grossly intact, muscle strength 5/5 in all extremities, sensation to light touch intact.  Patellar reflex 2+ bilaterally. No focal findings. Fine tremor in RLE noted while pt at rest.  Psych: Patient is not overtly psychotic, appropriate mood  and affect.  Labs on Admission:  Basic Metabolic Panel:  Recent Labs Lab 07/03/15 2155 07/03/15 2205  NA 121* 121*  K 4.7 4.5  CL 87* 88*  CO2 20*  --   GLUCOSE 140* 136*  BUN 18 21*  CREATININE 1.48* 1.50*  CALCIUM 9.4  --   MG 1.7  --    Liver Function Tests:  Recent Labs Lab 07/03/15 2155  AST 30  ALT 25  ALKPHOS 60  BILITOT 0.6  PROT 7.3  ALBUMIN 4.3   No results for input(s): LIPASE, AMYLASE in the last 168 hours. No results for input(s): AMMONIA in the last 168 hours. CBC:  Recent Labs Lab 07/03/15 2155 07/03/15 2205  WBC 11.2*  --   NEUTROABS 6.3  --   HGB 12.4* 14.3  HCT 35.4* 42.0  MCV 85.5  --   PLT 293  --    Cardiac Enzymes: No results for input(s): CKTOTAL, CKMB, CKMBINDEX, TROPONINI in the last 168 hours.  BNP (last 3 results) No results for input(s): BNP in the last 8760 hours.  ProBNP (last 3 results) No results for input(s): PROBNP in the last 8760 hours.  CBG: No results for input(s): GLUCAP in the last 168 hours.  Radiological Exams on Admission: No results found.  EKG: Independently reviewed.  Abnormal findings:   Sinus rhythm, low-voltage QRS    Assessment/Plan  1. Hyponatremia  - Na 121 on admission  - Suspect secondary to chlorthalidone, started ~10 days ago  - Running NS at 100 cc/hr  - Check BMP q4h overnight, adjust fluids prn  - Goal is to raise sodium by ~6 in 24 hrs  - Hold chlorthalidone    2. AKI on CKD 2 - SCr 1.50 on admission, up from apparent baseline of 1.1  - Likely secondary to dehydration - IVF with NS at 100 cc/hr, will adjust based on serial sodium measurements  - Repeat chem panel  - Avoid nephrotoxins where possible  - Holding lisinopril, will resume when SCr stabilizes    3. Hypertension  - Holding chlorthalidone and lisinopril as above  - Continue Norvasc and hydralazine  - Will use  IV hydralazine pushes prn SBP >180 or DBP >100   4. Anemia  - Hgb 12.4 on admission, stable  - Secondary  to chronic kidney disease  - No suggestion of active bleed  - Trend   5. Leukocytosis  - WBC 11,200 on admission  - Temp reportedly 28 F when EMS arrived at pt's home  - No s/s of infection identified  - Check procalcitonin  - Watch off abx  - Culture if fever spikes    DVT ppx: SQ Heparin     Code Status: Full code Family Communication:  Yes, patient's wife at bed side Disposition Plan: Admit to inpatient   Date of Service 07/03/2015    Vianne Bulls, MD [facility] Pager [phone]  If 7PM-7AM, please contact night-coverage [email or web address] Password [email or web address] 07/03/2015, 11:44 PM

## 2015-07-03 NOTE — ED Notes (Signed)
EMS reports CBG 172, 96% RA, 132/64

## 2015-07-03 NOTE — ED Notes (Signed)
Patient presents via EMS for tremors.  Patient was started on Chlorthalidone 25mg  several days ago and started with the muscle tremors.  Dr Martinique instructd him to stop the chlorthalidone and increase his Hydralazine to 50mg  3 times a day.  This AM he took 25mg , this afternoon he took 37.5mg  and was fine.  Tonight when he took there 50mg  he started with the tremors again approximately 63mins after taking the med.  Stated he had 1 bout of diarrhea and EMS rported he had a fever of 101 with their tympanic

## 2015-07-04 LAB — OSMOLALITY, URINE: Osmolality, Ur: 375 mOsm/kg (ref 300–900)

## 2015-07-04 LAB — BASIC METABOLIC PANEL
ANION GAP: 8 (ref 5–15)
ANION GAP: 10 (ref 5–15)
Anion gap: 14 (ref 5–15)
Anion gap: 9 (ref 5–15)
BUN: 18 mg/dL (ref 6–20)
BUN: 16 mg/dL (ref 6–20)
BUN: 14 mg/dL (ref 6–20)
BUN: 16 mg/dL (ref 6–20)
CALCIUM: 9 mg/dL (ref 8.9–10.3)
CHLORIDE: 91 mmol/L — AB (ref 101–111)
CHLORIDE: 92 mmol/L — AB (ref 101–111)
CO2: 21 mmol/L — ABNORMAL LOW (ref 22–32)
CO2: 22 mmol/L (ref 22–32)
CO2: 25 mmol/L (ref 22–32)
CO2: 23 mmol/L (ref 22–32)
CREATININE: 1.28 mg/dL — AB (ref 0.61–1.24)
CREATININE: 1.18 mg/dL (ref 0.61–1.24)
CREATININE: 1.12 mg/dL (ref 0.61–1.24)
Calcium: 9.1 mg/dL (ref 8.9–10.3)
Calcium: 8.7 mg/dL — ABNORMAL LOW (ref 8.9–10.3)
Calcium: 9.3 mg/dL (ref 8.9–10.3)
Chloride: 85 mmol/L — ABNORMAL LOW (ref 101–111)
Chloride: 88 mmol/L — ABNORMAL LOW (ref 101–111)
Creatinine, Ser: 1.18 mg/dL (ref 0.61–1.24)
GFR calc Af Amer: >60 mL/min (ref >60)
GFR calc Af Amer: >60 mL/min (ref >60)
GFR calc Af Amer: >60 mL/min (ref >60)
GFR calc non Af Amer: >60 mL/min (ref >60)
GFR calc non Af Amer: >60 mL/min (ref >60)
GFR, EST NON AFRICAN AMERICAN: 55 mL/min — AB (ref >60)
GLUCOSE: 121 mg/dL — AB (ref 65–99)
GLUCOSE: 130 mg/dL — AB (ref 65–99)
Glucose, Bld: 126 mg/dL — ABNORMAL HIGH (ref 65–99)
Glucose, Bld: 99 mg/dL (ref 65–99)
POTASSIUM: 5 mmol/L (ref 3.5–5.1)
Potassium: 4.8 mmol/L (ref 3.5–5.1)
Potassium: 4.3 mmol/L (ref 3.5–5.1)
Potassium: 4 mmol/L (ref 3.5–5.1)
SODIUM: 120 mmol/L — AB (ref 135–145)
SODIUM: 119 mmol/L — AB (ref 135–145)
Sodium: 124 mmol/L — ABNORMAL LOW (ref 135–145)
Sodium: 125 mmol/L — ABNORMAL LOW (ref 135–145)

## 2015-07-04 LAB — CBC WITH DIFFERENTIAL/PLATELET
BASOS PCT: 1 %
Basophils Absolute: 0.1 10*3/uL (ref 0.0–0.1)
EOS ABS: 0.2 10*3/uL (ref 0.0–0.7)
Eosinophils Relative: 3 %
HEMATOCRIT: 29.6 % — AB (ref 39.0–52.0)
Hemoglobin: 10.6 g/dL — ABNORMAL LOW (ref 13.0–17.0)
LYMPHS ABS: 2.6 10*3/uL (ref 0.7–4.0)
Lymphocytes Relative: 34 %
MCH: 30.6 pg (ref 26.0–34.0)
MCHC: 35.8 g/dL (ref 30.0–36.0)
MCV: 85.5 fL (ref 78.0–100.0)
MONO ABS: 0.6 10*3/uL (ref 0.1–1.0)
MONOS PCT: 8 %
NEUTROS ABS: 4.1 10*3/uL (ref 1.7–7.7)
Neutrophils Relative %: 54 %
Platelets: 238 10*3/uL (ref 150–400)
RBC: 3.46 MIL/uL — ABNORMAL LOW (ref 4.22–5.81)
RDW: 12.3 % (ref 11.5–15.5)
WBC: 7.6 10*3/uL (ref 4.0–10.5)

## 2015-07-04 LAB — TSH: TSH: 2.659 u[IU]/mL (ref 0.350–4.500)

## 2015-07-04 LAB — URINALYSIS, ROUTINE W REFLEX MICROSCOPIC
BILIRUBIN URINE: NEGATIVE
Glucose, UA: NEGATIVE mg/dL
Hgb urine dipstick: NEGATIVE
Ketones, ur: NEGATIVE mg/dL
Leukocytes, UA: NEGATIVE
NITRITE: NEGATIVE
PROTEIN: NEGATIVE mg/dL
SPECIFIC GRAVITY, URINE: 1.011 (ref 1.005–1.030)
pH: 5.5 (ref 5.0–8.0)

## 2015-07-04 LAB — MAGNESIUM: MAGNESIUM: 1.7 mg/dL (ref 1.7–2.4)

## 2015-07-04 LAB — PROCALCITONIN

## 2015-07-04 LAB — SODIUM, URINE, RANDOM: SODIUM UR: 36 mmol/L

## 2015-07-04 LAB — OSMOLALITY: Osmolality: 255 mOsm/kg — ABNORMAL LOW (ref 275–295)

## 2015-07-04 LAB — CREATININE, URINE, RANDOM: CREATININE, URINE: 100.97 mg/dL

## 2015-07-04 LAB — GLUCOSE, CAPILLARY: Glucose-Capillary: 109 mg/dL — ABNORMAL HIGH (ref 65–99)

## 2015-07-04 MED ORDER — ALBUTEROL SULFATE (2.5 MG/3ML) 0.083% IN NEBU: 3.0000 mL | INHALATION_SOLUTION | RESPIRATORY_TRACT | Status: DC | PRN

## 2015-07-04 MED ORDER — SODIUM CHLORIDE 1 G PO TABS
1.0000 g | ORAL_TABLET | Freq: Three times a day (TID) | ORAL | Status: DC
  Filled 2015-07-04 (×3): qty 1

## 2015-07-04 MED ORDER — FUROSEMIDE 40 MG PO TABS
40.0000 mg | ORAL_TABLET | Freq: Once | ORAL | Status: AC
  Administered 2015-07-04: 40 mg via ORAL
  Filled 2015-07-04: qty 1

## 2015-07-04 NOTE — Progress Notes (Signed)
NURSING PROGRESS NOTE  Todd Larmer AtkinsMRN: [id number] Admission Data: 07/04/2015 12:30am Attending Provider:Timothy Criss Rosales, MD PCP: Joycelyn Man, MD Code status: Full  Allergies:  Allergies  Allergen Reactions  . Prednisone Shortness Of Breath and Swelling  . Crestor [Rosuvastatin Calcium] Other (See Comments)    myalgia    Past Medical History:  Past Medical History  Diagnosis Date  . Allergic rhinitis   . GERD (gastroesophageal reflux disease)   . Hyperlipidemia   . Hypertension   . Osteoarthritis   . ED (erectile dysfunction)   . Carotid bruit     r  . Renal insufficiency   . Asthma   . Obesity   . CAD (coronary artery disease)     a. s/p PTCA/stenting of the mid LAD and mid RCA 04/06/12  . Aortic stenosis     a. Mild by echo 03/24/12.  Marland Kitchen Anemia     Past Surgical History:  Past Surgical History  Procedure Laterality Date  . Nm pet lymphoma      left side of neck 1999  . Cardiac stents Bilateral   . Percutaneous coronary stent intervention (pci-s) N/A 04/06/2012    Procedure: PERCUTANEOUS CORONARY STENT INTERVENTION (PCI-S);  Surgeon: Peter M Martinique, MD;  Location: [facility] CATH LAB;  Service: Cardiovascular;  Laterality: N/A;    Todd Spears is a 70 y.o. male patient, arrived to floor in room 5W03 via stretcher, transferred from ED. Patient alert and oriented X 4. No acute distress noted. Denies pain.   Vital signs: Oral temperature 98.4 F (36.9 C), Blood pressure 131/54, Pulse 60, RR 18, SpO2 100 % on room air.  Cardiac monitoring: Telemetry box 5W # 21 in place.  IV access: Left forearm; condition patent and no redness.  Skin: intact, no pressure ulcer noted in sacral area.   Patient's ID armband verified with patient/ family, and in place. Information packet given to patient/ family. Fall risk assessed, SR up X2, patient/ family able to verbalize understanding of risks associated with falls and to call nurse or staff to assist before getting out  of bed. Patient/ family oriented to room and equipment. Call bell within reach.

## 2015-07-04 NOTE — Progress Notes (Signed)
Utilization review completed. Taina Landry, RN, BSN. 

## 2015-07-04 NOTE — Progress Notes (Signed)
Paged MD about pt's BP of 131/51 and HR 55 and giving scheduled Hydralazine. MD returned page with permission to hold morning dose of Hydralazine. Will carry out orders at this time

## 2015-07-04 NOTE — Progress Notes (Signed)
Paged MD about pt critical sodium lab value of 119. MD returned page and gave orders to continue with current treatment.  Will carry out orders at this time.

## 2015-07-04 NOTE — ED Provider Notes (Signed)
CSN: [id number]     Arrival date & time 07/03/15  2124 History   First MD Initiated Contact with Patient 07/03/15 2131     Chief Complaint  Patient presents with  . Tremors     (Consider location/radiation/quality/duration/timing/severity/associated sxs/prior Treatment) HPI   44 y m w PMH htn, hl who was recently started on chlorthalidone and since earlier today has been having tremors in all four extremities without any other sx.  With ems he was thought to be febrile to 101 with their tympanic thermometer but he noticed no fever at home or other infectious sx.  Past Medical History  Diagnosis Date  . Allergic rhinitis   . GERD (gastroesophageal reflux disease)   . Hyperlipidemia   . Hypertension   . Osteoarthritis   . ED (erectile dysfunction)   . Carotid bruit     r  . Renal insufficiency   . Asthma   . Obesity   . CAD (coronary artery disease)     a. s/p PTCA/stenting of the mid LAD and mid RCA 04/06/12  . Aortic stenosis     a. Mild by echo 03/24/12.  Marland Kitchen Anemia    Past Surgical History  Procedure Laterality Date  . Nm pet lymphoma      left side of neck 1999  . Cardiac stents Bilateral   . Percutaneous coronary stent intervention (pci-s) N/A 04/06/2012    Procedure: PERCUTANEOUS CORONARY STENT INTERVENTION (PCI-S);  Surgeon: Peter M Martinique, MD;  Location: [facility] CATH LAB;  Service: Cardiovascular;  Laterality: N/A;   Family History  Problem Relation Age of Onset  . Hypertension    . Stroke    . Coronary artery disease Father     in his 27s  . Heart disease Father   . Heart attack Father    Social History  Substance Use Topics  . Smoking status: Former Research scientist (life sciences)  . Smokeless tobacco: Never Used  . Alcohol Use: No    Review of Systems  Constitutional: Negative for fever and chills.  Eyes: Negative for redness.  Respiratory: Negative for cough and shortness of breath.   Cardiovascular: Negative for chest pain.  Gastrointestinal: Negative for nausea, vomiting,  abdominal pain and diarrhea.  Genitourinary: Negative for dysuria.  Skin: Negative for rash.  Neurological: Positive for tremors. Negative for headaches.  All other systems reviewed and are negative.     Allergies  Prednisone and Crestor  Home Medications   Prior to Admission medications   Medication Sig Start Date End Date Taking? Authorizing Provider  albuterol (PROVENTIL HFA;VENTOLIN HFA) 108 (90 BASE) MCG/ACT inhaler Inhale 2 puffs into the lungs every 6 (six) hours as needed. For shortness of breath 09/15/12  Yes Dorena Cookey, MD  amLODipine (NORVASC) 10 MG tablet Take 1 tablet (10 mg total) by mouth daily. 06/16/14  Yes Dorena Cookey, MD  aspirin 81 MG chewable tablet Chew 81 mg by mouth daily.   Yes Historical Provider, MD  COLCRYS 0.6 MG tablet Take 0.6 mg by mouth daily as needed (for gout flareups).  04/09/12  Yes Historical Provider, MD  Cyanocobalamin (VITAMIN B 12 PO) Take 1 tablet by mouth daily.    Yes Historical Provider, MD  esomeprazole (NEXIUM) 40 MG capsule TAKE 1 CAPSULE DAILY BEFORE BREAKFAST 06/16/15  Yes Dorena Cookey, MD  FeFum-FePoly-FA-B Cmp-C-Biot (INTEGRA PLUS) CAPS Take 1 tablet by mouth daily. 07/04/14  Yes Historical Provider, MD  hydrALAZINE (APRESOLINE) 50 MG tablet Take 1 tablet (50 mg total) by  mouth 3 (three) times daily. 06/29/15  Yes Peter M Martinique, MD  lisinopril (PRINIVIL,ZESTRIL) 20 MG tablet TAKE 1 TABLET TWICE A DAY 06/16/15  Yes Dorena Cookey, MD  loratadine (CLARITIN) 10 MG tablet Take 10 mg by mouth daily.   Yes Historical Provider, MD  metoprolol succinate (TOPROL-XL) 50 MG 24 hr tablet TAKE 1 TABLET DAILY WITH OR IMMEDIATELY FOLLOWING A MEAL 06/16/14  Yes Dorena Cookey, MD  montelukast (SINGULAIR) 10 MG tablet Take 1 tablet (10 mg total) by mouth at bedtime. 05/23/15  Yes Dorena Cookey, MD  Multiple Vitamin (MULTIVITAMIN WITH MINERALS) TABS tablet Take 1 tablet by mouth daily.   Yes Historical Provider, MD  Multiple Vitamins-Minerals  (VISION-VITE PRESERVE PO) Take 1 tablet by mouth daily.   Yes Historical Provider, MD  Saline (SIMPLY SALINE) 0.9 % AERS Place 1 spray into the nose daily as needed (at bedtime).   Yes Historical Provider, MD  TURMERIC PO Take 500 mg by mouth daily.   Yes Historical Provider, MD  ULORIC 80 MG TABS Take 1 tablet by mouth daily. 07/25/14  Yes Historical Provider, MD  chlorthalidone (HYGROTON) 25 MG tablet Take 25 mg by mouth daily. Reported on 07/03/2015 06/23/15   Historical Provider, MD  halobetasol (ULTRAVATE) 0.05 % cream Apply topically 2 (two) times daily. 12/17/10   Dorena Cookey, MD  mometasone (NASONEX) 50 MCG/ACT nasal spray Place 2 sprays into the nose as needed. 05/23/15   Dorena Cookey, MD  nitroGLYCERIN (NITROSTAT) 0.4 MG SL tablet Place 1 tablet (0.4 mg total) under the tongue every 5 (five) minutes as needed for chest pain (up to 3 doses. not within 24hrs of Viagra). 11/21/14   Peter M Martinique, MD  traMADol Veatrice Bourbon) 50 MG tablet One half to one tablet 3 times daily when necessary for pain 03/04/13   Dorena Cookey, MD   BP 131/51 mmHg  Pulse 55  Temp(Src) 97.9 F (36.6 C) (Oral)  Resp 14  Ht 5\' 7"  (1.702 m)  Wt 97.07 kg  BMI 33.51 kg/m2  SpO2 99% Physical Exam  Constitutional: He is oriented to person, place, and time. No distress.  HENT:  Head: Normocephalic and atraumatic.  Eyes: EOM are normal. Pupils are equal, round, and reactive to light.  Neck: Normal range of motion. Neck supple.  Cardiovascular: Normal rate.   Pulmonary/Chest: Effort normal. No respiratory distress.  Abdominal: Soft. There is no tenderness.  Musculoskeletal: Normal range of motion.  Neurological: He is alert and oriented to person, place, and time. He has normal strength. No cranial nerve deficit or sensory deficit. GCS eye subscore is 4. GCS verbal subscore is 5. GCS motor subscore is 6.  Patient with intermittent tremors in all four extremities.  Skin: No rash noted. He is not diaphoretic.   Psychiatric: He has a normal mood and affect.    ED Course  Procedures (including critical care time) Labs Review Labs Reviewed  CBC WITH DIFFERENTIAL/PLATELET - Abnormal; Notable for the following:    WBC 11.2 (*)    RBC 4.14 (*)    Hemoglobin 12.4 (*)    HCT 35.4 (*)    Monocytes Absolute 1.1 (*)    All other components within normal limits  COMPREHENSIVE METABOLIC PANEL - Abnormal; Notable for the following:    Sodium 121 (*)    Chloride 87 (*)    CO2 20 (*)    Glucose, Bld 140 (*)    Creatinine, Ser 1.48 (*)    GFR  calc non Af Amer 46 (*)    GFR calc Af Amer 54 (*)    All other components within normal limits  OSMOLALITY - Abnormal; Notable for the following:    Osmolality 255 (*)    All other components within normal limits  BASIC METABOLIC PANEL - Abnormal; Notable for the following:    Sodium 120 (*)    Chloride 85 (*)    CO2 21 (*)    Glucose, Bld 126 (*)    Creatinine, Ser 1.28 (*)    GFR calc non Af Amer 55 (*)    All other components within normal limits  BASIC METABOLIC PANEL - Abnormal; Notable for the following:    Sodium 119 (*)    Chloride 88 (*)    Glucose, Bld 121 (*)    Calcium 8.7 (*)    All other components within normal limits  CBC WITH DIFFERENTIAL/PLATELET - Abnormal; Notable for the following:    RBC 3.46 (*)    Hemoglobin 10.6 (*)    HCT 29.6 (*)    All other components within normal limits  BASIC METABOLIC PANEL - Abnormal; Notable for the following:    Sodium 124 (*)    Chloride 91 (*)    Glucose, Bld 130 (*)    All other components within normal limits  GLUCOSE, CAPILLARY - Abnormal; Notable for the following:    Glucose-Capillary 109 (*)    All other components within normal limits  I-STAT CHEM 8, ED - Abnormal; Notable for the following:    Sodium 121 (*)    Chloride 88 (*)    BUN 21 (*)    Creatinine, Ser 1.50 (*)    Glucose, Bld 136 (*)    Calcium, Ion 1.09 (*)    All other components within normal limits  MAGNESIUM   OSMOLALITY, URINE  URINALYSIS, ROUTINE W REFLEX MICROSCOPIC (NOT AT [facility])  SODIUM, URINE, RANDOM  CREATININE, URINE, RANDOM  PROCALCITONIN  MAGNESIUM  TSH  UREA NITROGEN, URINE  BASIC METABOLIC PANEL  BASIC METABOLIC PANEL    Imaging Review No results found. I have personally reviewed and evaluated these images and lab results as part of my medical decision-making.   EKG Interpretation   Date/Time:  Monday July 03 2015 21:35:14 EST Ventricular Rate:  71 PR Interval:  197 QRS Duration: 104 QT Interval:  378 QTC Calculation: 411 R Axis:   9 Text Interpretation:  Sinus rhythm Low voltage, precordial leads No  significant change since last tracing Confirmed by YAO  MD, Audric ([id number])  on 07/03/2015 9:38:17 PM      MDM   Final diagnoses:  Hyponatremia tremors    89 y m w PMH htn, hl who was recently started on chlorthalidone and since earlier today has been having tremors in all four extremities without any other sx.  On exam, AFVSS.  Unremarkable neuro exam other than intermittent tremors.  Concern for infection vs. Electrolyte abnormality.  Will obtain cbc/bmp/ rectal temp   Rectal temp normal, rest of vitals unremarkable, cbc w/o leukocytosis, doubt infectious process.  Bmp with na of 121.  Will start gentle NS infusion and admit for acute hyponatremia likely 2/2 diuretics    Jarome Matin, MD 07/04/15 [place] Yao, MD 07/06/15 [id number]

## 2015-07-04 NOTE — Progress Notes (Signed)
CRITICAL VALUE ALERT  Critical value received:  Sodium 119  Date of notification:  07/04/2015  Time of notification:  [id number]  Critical value read back:Yes.    Nurse who received alert:  Will Bonnet, RN  MD notified (1st page):  Schorr  Time of first page:  [phone]  Responding MD:  Schorr  Time MD responded:  [phone]

## 2015-07-04 NOTE — Progress Notes (Signed)
Attempted report X1

## 2015-07-04 NOTE — ED Notes (Signed)
Patient to 5W incorrect documntation

## 2015-07-04 NOTE — Progress Notes (Signed)
Patient Demographics:    Todd Spears, is a 70 y.o. male, DOB - Oct 20, 1945, [id number]  Admit date - 07/03/2015   Admitting Physician Vianne Bulls, MD  Outpatient Primary MD for the patient is TODD,JEFFREY Zenia Resides, MD  LOS - 1   Chief Complaint  Patient presents with  . Tremors        Subjective:    Todd Spears today has, No headache, No chest pain, No abdominal pain - No Nausea, No new weakness tingling or numbness, No Cough - SOB.     Assessment  & Plan :     1. Hyponatremia - most likely SIADH, Ur Osm >> Sr Osm, also drinks ++ water,  Also recently started on chlorthalidone 10 days ago - stop IV fluids, Will place him on strict 1.2 L of water and total fluid restriction, nursing staff and patient counseled, discussed with nephrologist Dr. Posey Pronto, one-time dose of Lasix 40 mg orally. Monitor BMP every 12 hours.  2. AKI On admission. Urine sodium greater than 100, much improved after ACE inhibitor was held. Continue to hold ACE inhibitor and avoid nephrotoxins. Monitor BMP. AKI resolved.  3. Essential hypertension. On Lopressor, Norvasc and hydralazine monitor.  4. AOCD - stable.  5. GERD - PPI.  6. Mild nonspecific leukocytosis upon admission. Resolved. Monitor temperature curve. Nontoxic appearance. UA stable.    Code Status : Full  Family Communication  : None  Disposition Plan  : Home 1-2 days  Consults  :  Renal - phone   Procedures  :    DVT Prophylaxis  :  Heparin   Lab Results  Component Value Date   PLT 238 07/04/2015    Inpatient Medications  Scheduled Meds: . amLODipine  10 mg Oral Daily  . aspirin  81 mg Oral Daily  . febuxostat  80 mg Oral Daily  . ferrous Q000111Q C-folic acid  1 capsule Oral Daily  . heparin  5,000 Units Subcutaneous 3 times  per day  . hydrALAZINE  25 mg Oral 3 times per day  . loratadine  10 mg Oral Daily  . metoprolol succinate  50 mg Oral Daily  . montelukast  10 mg Oral QHS  . multivitamin with minerals  1 tablet Oral Daily  . pantoprazole  40 mg Oral Daily  . sodium chloride flush  3 mL Intravenous Q12H  . sodium chloride  1 g Oral TID WC   Continuous Infusions:  PRN Meds:.acetaminophen **OR** [DISCONTINUED] acetaminophen, albuterol, bisacodyl, HYDROcodone-acetaminophen, nitroGLYCERIN, [DISCONTINUED] ondansetron **OR** ondansetron (ZOFRAN) IV, senna-docusate, sodium chloride  Antibiotics  :     Anti-infectives    None        Objective:   Filed Vitals:   07/04/15 0000 07/04/15 0015 07/04/15 0047 07/04/15 0511  BP: 139/67 121/60 131/54 131/51  Pulse: 66 59 60 55  Temp:   98.4 F (36.9 C) 97.9 F (36.6 C)  TempSrc:   Oral Oral  Resp: 16 17 18 14   Height:      Weight:      SpO2: 99% 99% 100% 99%    Wt Readings from Last 3 Encounters:  07/03/15 97.07 kg (214 lb)  05/23/15 96.163 kg (212 lb)  03/14/15 94.348 kg (208 lb)  Intake/Output Summary (Last 24 hours) at 07/04/15 1028 Last data filed at 07/04/15 0647  Gross per 24 hour  Intake 567.08 ml  Output    625 ml  Net -57.92 ml     Physical Exam  Awake Alert, Oriented X 3, No new F.N deficits, Normal affect [place].AT,PERRAL Supple Neck,No JVD, No cervical lymphadenopathy appriciated.  Symmetrical Chest wall movement, Good air movement bilaterally, CTAB RRR,No Gallops,Rubs or new Murmurs, No Parasternal Heave +ve B.Sounds, Abd Soft, No tenderness, No organomegaly appriciated, No rebound - guarding or rigidity. No Cyanosis, Clubbing or edema, No new Rash or bruise       Data Review:   Micro Results No results found for this or any previous visit (from the past 240 hour(s)).  Radiology Reports No results found.   CBC  Recent Labs Lab 07/03/15 2155 07/03/15 2205 07/04/15 0410  WBC 11.2*  --  7.6  HGB 12.4* 14.3 10.6*   HCT 35.4* 42.0 29.6*  PLT 293  --  238  MCV 85.5  --  85.5  MCH 30.0  --  30.6  MCHC 35.0  --  35.8  RDW 12.2  --  12.3  LYMPHSABS 3.2  --  2.6  MONOABS 1.1*  --  0.6  EOSABS 0.4  --  0.2  BASOSABS 0.1  --  0.1    Chemistries   Recent Labs Lab 07/03/15 2155 07/03/15 2205 07/04/15 0144 07/04/15 0417  NA 121* 121* 120* 119*  K 4.7 4.5 4.8 4.3  CL 87* 88* 85* 88*  CO2 20*  --  21* 22  GLUCOSE 140* 136* 126* 121*  BUN 18 21* 18 16  CREATININE 1.48* 1.50* 1.28* 1.18  CALCIUM 9.4  --  9.1 8.7*  MG 1.7  --  1.7  --   AST 30  --   --   --   ALT 25  --   --   --   ALKPHOS 60  --   --   --   BILITOT 0.6  --   --   --    ------------------------------------------------------------------------------------------------------------------ No results for input(s): CHOL, HDL, LDLCALC, TRIG, CHOLHDL, LDLDIRECT in the last 72 hours.  No results found for: HGBA1C ------------------------------------------------------------------------------------------------------------------  Recent Labs  07/04/15 0145  TSH 2.659   ------------------------------------------------------------------------------------------------------------------ No results for input(s): VITAMINB12, FOLATE, FERRITIN, TIBC, IRON, RETICCTPCT in the last 72 hours.  Coagulation profile No results for input(s): INR, PROTIME in the last 168 hours.  No results for input(s): DDIMER in the last 72 hours.  Cardiac Enzymes No results for input(s): CKMB, TROPONINI, MYOGLOBIN in the last 168 hours.  Invalid input(s): CK ------------------------------------------------------------------------------------------------------------------ No results found for: BNP  Time Spent in minutes   35   Liliahna Cudd K M.D on 07/04/2015 at 10:28 AM  Between 7am to 7pm - Pager - [phone]  After 7pm go to [email or web address] - password [email or web address]  [facility] -  Office  [phone]

## 2015-07-05 DIAGNOSIS — E871 Hypo-osmolality and hyponatremia: Secondary | ICD-10-CM

## 2015-07-05 DIAGNOSIS — I10 Essential (primary) hypertension: Secondary | ICD-10-CM

## 2015-07-05 DIAGNOSIS — N189 Chronic kidney disease, unspecified: Secondary | ICD-10-CM

## 2015-07-05 DIAGNOSIS — N179 Acute kidney failure, unspecified: Secondary | ICD-10-CM

## 2015-07-05 DIAGNOSIS — D72829 Elevated white blood cell count, unspecified: Secondary | ICD-10-CM

## 2015-07-05 LAB — BASIC METABOLIC PANEL
ANION GAP: 11 (ref 5–15)
BUN: 18 mg/dL (ref 6–20)
CO2: 26 mmol/L (ref 22–32)
Calcium: 9.6 mg/dL (ref 8.9–10.3)
Chloride: 90 mmol/L — ABNORMAL LOW (ref 101–111)
Creatinine, Ser: 1.19 mg/dL (ref 0.61–1.24)
GFR calc Af Amer: >60 mL/min (ref >60)
GLUCOSE: 121 mg/dL — AB (ref 65–99)
POTASSIUM: 5 mmol/L (ref 3.5–5.1)
Sodium: 127 mmol/L — ABNORMAL LOW (ref 135–145)

## 2015-07-05 LAB — UREA NITROGEN, URINE: Urea Nitrogen, Ur: 480 mg/dL

## 2015-07-05 LAB — PROCALCITONIN: Procalcitonin: <0.1 ng/mL

## 2015-07-05 LAB — GLUCOSE, CAPILLARY: Glucose-Capillary: 102 mg/dL — ABNORMAL HIGH (ref 65–99)

## 2015-07-05 NOTE — Care Management Note (Signed)
Case Management Note  Patient Details  Name: Todd Spears MRN: [id number] Date of Birth: April 14, 1946  Subjective/Objective:                 Spoke with patient and wife at the bedside. No CM needs identified. Will anticipate return to home tomorrow if Na improves.   Action/Plan:  No CM needs.  Expected Discharge Date:                  Expected Discharge Plan:  Home/Self Care  In-House Referral:     Discharge planning Services  CM Consult  Post Acute Care Choice:    Choice offered to:     DME Arranged:    DME Agency:     HH Arranged:    [place] Agency:     Status of Service:  Completed, signed off  Medicare Important Message Given:    Date Medicare IM Given:    Medicare IM give by:    Date Additional Medicare IM Given:    Additional Medicare Important Message give by:     If discussed at [place] of Stay Meetings, dates discussed:    Additional Comments:  Carles Collet, RN 07/05/2015, 12:32 PM

## 2015-07-05 NOTE — Progress Notes (Signed)
[facility] PROGRESS NOTE    Progress Note   Todd Spears [id number] DOB: 1945/11/03 DOA: 07/03/2015 PCP: Joycelyn Man, MD   Brief Narrative:   Todd Spears is an 70 y.o. male   Assessment/Plan:  Hyponatremia Most likely multifactorial due to SIADH and probably chlorthalidone, urine osmolarity is 400, urinary sodium is 36 urine creatinine is 100, serum targeted osmolarity was 250. Sodium continues to improve with fluid restriction and single dose of oral Lasix. Continue conservative measures recheck a basic metabolic panel in the morning.  Acute kidney injury: Resolved likely due to ACE inhibitor.  Essential hypertension: Continue Lopressor and Norvasc and hydralazine blood pressure seems to be well controlled.  Anemia in chronic renal disease Stable follow-up with PCP as an outpatient.  Leukocytosis Afebrile leukocytosis resolved off antibiotics.    DVT Prophylaxis - Lovenox ordered.  Family Communication: none Disposition Plan: Home in am Code Status:     Code Status Orders        Start     Ordered   07/03/15 2342  Full code   Continuous     07/03/15 2344    Code Status History    Date Active Date Inactive Code Status Order ID Comments User Context   04/06/2012  6:36 PM 04/07/2012  4:22 PM Full Code [id number]  Simonne Come, RN Inpatient        IV Access:    Peripheral IV   Procedures and diagnostic studies:   No results found.   Medical Consultants:    None.  Anti-Infectives:   Anti-infectives    None      Subjective:    Todd Spears no complaints feels great symptoms resolved.  Objective:    Filed Vitals:   07/04/15 0511 07/04/15 1531 07/04/15 2204 07/05/15 0514  BP: 131/51 124/41 125/42 107/38  Pulse: 55 62 59 53  Temp: 97.9 F (36.6 C) 98.5 F (36.9 C) 97.5 F (36.4 C) 97.9 F (36.6 C)  TempSrc: Oral Oral Oral Oral  Resp: 14 21 18 20   Height:      Weight:    95.346 kg (210 lb 3.2 oz)    SpO2: 99% 99% 99% 97%    Intake/Output Summary (Last 24 hours) at 07/05/15 0841 Last data filed at 07/05/15 0644  Gross per 24 hour  Intake   1052 ml  Output    450 ml  Net    602 ml   Filed Weights   07/03/15 2157 07/05/15 0514  Weight: 97.07 kg (214 lb) 95.346 kg (210 lb 3.2 oz)    Exam: Gen:  NAD Cardiovascular:  RRR. Chest and lungs:   CTAB Abdomen:  Abdomen soft, NT/ND, + BS Extremities:  No edema.   Data Reviewed:    Labs: Basic Metabolic Panel:  Recent Labs Lab 07/03/15 2155 07/03/15 2205 07/04/15 0144 07/04/15 0417 07/04/15 1007 07/04/15 1316  NA 121* 121* 120* 119* 124* 125*  K 4.7 4.5 4.8 4.3 4.0 5.0  CL 87* 88* 85* 88* 91* 92*  CO2 20*  --  21* 22 25 23   GLUCOSE 140* 136* 126* 121* 130* 99  BUN 18 21* 18 16 14 16   CREATININE 1.48* 1.50* 1.28* 1.18 1.18 1.12  CALCIUM 9.4  --  9.1 8.7* 9.0 9.3  MG 1.7  --  1.7  --   --   --    GFR Estimated Creatinine Clearance: 67.5 mL/min (by C-G formula based on Cr of 1.12). Liver Function Tests:  Recent Labs  Lab 07/03/15 2155  AST 30  ALT 25  ALKPHOS 60  BILITOT 0.6  PROT 7.3  ALBUMIN 4.3   No results for input(s): LIPASE, AMYLASE in the last 168 hours. No results for input(s): AMMONIA in the last 168 hours. Coagulation profile No results for input(s): INR, PROTIME in the last 168 hours.  CBC:  Recent Labs Lab 07/03/15 2155 07/03/15 2205 07/04/15 0410  WBC 11.2*  --  7.6  NEUTROABS 6.3  --  4.1  HGB 12.4* 14.3 10.6*  HCT 35.4* 42.0 29.6*  MCV 85.5  --  85.5  PLT 293  --  238   Cardiac Enzymes: No results for input(s): CKTOTAL, CKMB, CKMBINDEX, TROPONINI in the last 168 hours. BNP (last 3 results) No results for input(s): PROBNP in the last 8760 hours. CBG:  Recent Labs Lab 07/04/15 0752 07/05/15 0805  GLUCAP 109* 102*   D-Dimer: No results for input(s): DDIMER in the last 72 hours. Hgb A1c: No results for input(s): HGBA1C in the last 72 hours. Lipid Profile: No results for  input(s): CHOL, HDL, LDLCALC, TRIG, CHOLHDL, LDLDIRECT in the last 72 hours. Thyroid function studies:  Recent Labs  07/04/15 0145  TSH 2.659   Anemia work up: No results for input(s): VITAMINB12, FOLATE, FERRITIN, TIBC, IRON, RETICCTPCT in the last 72 hours. Sepsis Labs:  Recent Labs Lab 07/03/15 2155 07/04/15 0144 07/04/15 0410 07/05/15 0437  PROCALCITON  --  <0.10  --  <0.10  WBC 11.2*  --  7.6  --    Microbiology No results found for this or any previous visit (from the past 240 hour(s)).   Medications:   . amLODipine  10 mg Oral Daily  . aspirin  81 mg Oral Daily  . febuxostat  80 mg Oral Daily  . ferrous Q000111Q C-folic acid  1 capsule Oral Daily  . heparin  5,000 Units Subcutaneous 3 times per day  . hydrALAZINE  25 mg Oral 3 times per day  . loratadine  10 mg Oral Daily  . metoprolol succinate  50 mg Oral Daily  . montelukast  10 mg Oral QHS  . multivitamin with minerals  1 tablet Oral Daily  . pantoprazole  40 mg Oral Daily  . sodium chloride flush  3 mL Intravenous Q12H   Continuous Infusions:   Time spent: 25 min   LOS: 2 days   Charlynne Cousins  [facility] Pager [phone]  *Please refer to [email or web address], password [email or web address] to get updated schedule on who will round on this patient, as hospitalists switch teams weekly. If 7PM-7AM, please contact night-coverage at [email or web address], password [email or web address] for any overnight needs.  07/05/2015, 8:41 AM

## 2015-07-06 DIAGNOSIS — D631 Anemia in chronic kidney disease: Secondary | ICD-10-CM

## 2015-07-06 LAB — GLUCOSE, CAPILLARY: Glucose-Capillary: 102 mg/dL — ABNORMAL HIGH (ref 65–99)

## 2015-07-06 NOTE — Discharge Summary (Signed)
Physician Discharge Summary  Todd Spears DOB: [birth year] DOA: 07/03/2015  PCP: Joycelyn Man, MD  Admit date: 07/03/2015 Discharge date: 07/06/2015  Time spent: 35 minutes  Recommendations for Outpatient Follow-up:  1. Follow-up with cardiology in 2 weeks check a basic metabolic panel and titrate antihypertensive medications as needed.   Discharge Diagnoses:  Principal Problem:   Hyponatremia Active Problems:   Anemia in chronic renal disease   Essential hypertension   Acute-on-chronic kidney injury ([place])   Leukocytosis   Discharge Condition: stable  Diet recommendation: heart healthy  Filed Weights   07/03/15 2157 07/05/15 0514 07/06/15 0523  Weight: 97.07 kg (214 lb) 95.346 kg (210 lb 3.2 oz) 94.4 kg (208 lb 1.8 oz)    History of present illness:  70 year old with past medical history of coronary artery disease with stent asthma and hypertension who presents to the ED with 1 week worsening of tremor. He relates that around that time he was started on chlorthalidone and his symptoms has persisted and worsened. The ED his initial lab work showed a sodium of 121 and a chloride of 88 his serum creatinine was 1.4 (baseline of 1.1).   Hospital Course:  Hyponatremia: Likely multifactorial due to SIADH and probably chlorthalidone, urine osmolarity was 400 urinary sodium was 36 with a urine creatinine of 100 mL or more laterally was calculated which was 250. This points towards a Sadie age. He was fluid restricted and given sodium tablets he was also given a single dose of oral Lasix. And his sodium trended up slowly. Go home on a fluid restricted diet of 2 L a day. Will follow-up with cardiology in 2 weeks check a basic metabolic panel.  Acute kidney injury: Likely multifactorial due to prerenal and Ace inhibitor, fractional secretion sodium was less than 1. This resolved with IV hydration.  Essential hypertension: Continue Lopressor and Norvasc and  hydralazine. He will follow-up with cardiology in 1 week and titrate medications as needed.  Anemia of chronic disease: Has remained stable follow-up with PCP.  Leukocytosis: Probably stress emargination has remained febrile.   Procedures:  none  Consultations:  none  Discharge Exam: Filed Vitals:   07/05/15 2058 07/06/15 0523  BP: 146/61 126/45  Pulse: 96 57  Temp: 98.2 F (36.8 C) 97.7 F (36.5 C)  Resp:  12    General: A&O x3 Cardiovascular: RRR Respiratory: good air movement CTA B/L  Discharge Instructions   Discharge Instructions    Diet - low sodium heart healthy    Complete by:  As directed      Increase activity slowly    Complete by:  As directed           Current Discharge Medication List    CONTINUE these medications which have NOT CHANGED   Details  albuterol (PROVENTIL HFA;VENTOLIN HFA) 108 (90 BASE) MCG/ACT inhaler Inhale 2 puffs into the lungs every 6 (six) hours as needed. For shortness of breath Qty: 2 Inhaler, Refills: 1   Associated Diagnoses: Allergic rhinitis, cause unspecified    amLODipine (NORVASC) 10 MG tablet Take 1 tablet (10 mg total) by mouth daily. Qty: 100 tablet, Refills: 3    aspirin 81 MG chewable tablet Chew 81 mg by mouth daily.    COLCRYS 0.6 MG tablet Take 0.6 mg by mouth daily as needed (for gout flareups).     Cyanocobalamin (VITAMIN B 12 PO) Take 1 tablet by mouth daily.     esomeprazole (NEXIUM) 40 MG capsule TAKE 1 CAPSULE DAILY  BEFORE BREAKFAST Qty: 100 capsule, Refills: 2    FeFum-FePoly-FA-B Cmp-C-Biot (INTEGRA PLUS) CAPS Take 1 tablet by mouth daily.    hydrALAZINE (APRESOLINE) 50 MG tablet Take 1 tablet (50 mg total) by mouth 3 (three) times daily. Qty: 270 tablet, Refills: 3    lisinopril (PRINIVIL,ZESTRIL) 20 MG tablet TAKE 1 TABLET TWICE A DAY Qty: 200 tablet, Refills: 2    loratadine (CLARITIN) 10 MG tablet Take 10 mg by mouth daily.    metoprolol succinate (TOPROL-XL) 50 MG 24 hr tablet TAKE  1 TABLET DAILY WITH OR IMMEDIATELY FOLLOWING A MEAL Qty: 100 tablet, Refills: 3    montelukast (SINGULAIR) 10 MG tablet Take 1 tablet (10 mg total) by mouth at bedtime. Qty: 30 tablet, Refills: 3    !! Multiple Vitamin (MULTIVITAMIN WITH MINERALS) TABS tablet Take 1 tablet by mouth daily.    !! Multiple Vitamins-Minerals (VISION-VITE PRESERVE PO) Take 1 tablet by mouth daily.    Saline (SIMPLY SALINE) 0.9 % AERS Place 1 spray into the nose daily as needed (at bedtime).    TURMERIC PO Take 500 mg by mouth daily.    ULORIC 80 MG TABS Take 1 tablet by mouth daily.    halobetasol (ULTRAVATE) 0.05 % cream Apply topically 2 (two) times daily. Qty: 50 g, Refills: 4   Associated Diagnoses: Allergic rhinitis, cause unspecified    mometasone (NASONEX) 50 MCG/ACT nasal spray Place 2 sprays into the nose as needed. Qty: 34 g, Refills: 11   Associated Diagnoses: Allergic rhinitis, unspecified allergic rhinitis type    nitroGLYCERIN (NITROSTAT) 0.4 MG SL tablet Place 1 tablet (0.4 mg total) under the tongue every 5 (five) minutes as needed for chest pain (up to 3 doses. not within 24hrs of Viagra). Qty: 25 tablet, Refills: 6    traMADol (ULTRAM) 50 MG tablet One half to one tablet 3 times daily when necessary for pain Qty: 100 tablet, Refills: 3   Associated Diagnoses: Restless legs syndrome (RLS); Osteoarthrosis, unspecified whether generalized or localized, unspecified site     !! - Potential duplicate medications found. Please discuss with provider.    STOP taking these medications     chlorthalidone (HYGROTON) 25 MG tablet        Allergies  Allergen Reactions  . Prednisone Shortness Of Breath and Swelling  . Crestor [Rosuvastatin Calcium] Other (See Comments)    myalgia   Follow-up Information    Follow up with Peter Martinique, MD In 2 weeks.   Specialty:  Cardiology   Why:  hospital follow up   Contact information:   [address] [place]  [id number] [phone]        The results of significant diagnostics from this hospitalization (including imaging, microbiology, ancillary and laboratory) are listed below for reference.    Significant Diagnostic Studies: No results found.  Microbiology: No results found for this or any previous visit (from the past 240 hour(s)).   Labs: Basic Metabolic Panel:  Recent Labs Lab 07/03/15 2155  07/04/15 0144 07/04/15 0417 07/04/15 1007 07/04/15 1316 07/05/15 1227  NA 121*  < > 120* 119* 124* 125* 127*  K 4.7  < > 4.8 4.3 4.0 5.0 5.0  CL 87*  < > 85* 88* 91* 92* 90*  CO2 20*  --  21* 22 25 23 26   GLUCOSE 140*  < > 126* 121* 130* 99 121*  BUN 18  < > 18 16 14 16 18   CREATININE 1.48*  < > 1.28* 1.18 1.18  1.12 1.19  CALCIUM 9.4  --  9.1 8.7* 9.0 9.3 9.6  MG 1.7  --  1.7  --   --   --   --   < > = values in this interval not displayed. Liver Function Tests:  Recent Labs Lab 07/03/15 2155  AST 30  ALT 25  ALKPHOS 60  BILITOT 0.6  PROT 7.3  ALBUMIN 4.3   No results for input(s): LIPASE, AMYLASE in the last 168 hours. No results for input(s): AMMONIA in the last 168 hours. CBC:  Recent Labs Lab 07/03/15 2155 07/03/15 2205 07/04/15 0410  WBC 11.2*  --  7.6  NEUTROABS 6.3  --  4.1  HGB 12.4* 14.3 10.6*  HCT 35.4* 42.0 29.6*  MCV 85.5  --  85.5  PLT 293  --  238   Cardiac Enzymes: No results for input(s): CKTOTAL, CKMB, CKMBINDEX, TROPONINI in the last 168 hours. BNP: BNP (last 3 results) No results for input(s): BNP in the last 8760 hours.  ProBNP (last 3 results) No results for input(s): PROBNP in the last 8760 hours.  CBG:  Recent Labs Lab 07/04/15 0752 07/05/15 0805 07/06/15 0734  GLUCAP 109* 102* 102*     Signed:  Charlynne Cousins MD.  [facility] 07/06/2015, 10:33 AM

## 2015-07-06 NOTE — Progress Notes (Signed)
Nsg Discharge Note  Admit Date:  07/03/2015 Discharge date: 07/06/2015   Todd Spears to be D/C'd Home per MD order.  AVS completed.  Copy for chart, and copy for patient signed, and dated. Patient/caregiver able to verbalize understanding.  Discharge Medication:   Medication List    STOP taking these medications        chlorthalidone 25 MG tablet  Commonly known as:  HYGROTON      TAKE these medications        albuterol 108 (90 Base) MCG/ACT inhaler  Commonly known as:  PROVENTIL HFA;VENTOLIN HFA  Inhale 2 puffs into the lungs every 6 (six) hours as needed. For shortness of breath     amLODipine 10 MG tablet  Commonly known as:  NORVASC  Take 1 tablet (10 mg total) by mouth daily.     aspirin 81 MG chewable tablet  Chew 81 mg by mouth daily.     COLCRYS 0.6 MG tablet  Generic drug:  colchicine  Take 0.6 mg by mouth daily as needed (for gout flareups).     esomeprazole 40 MG capsule  Commonly known as:  NEXIUM  TAKE 1 CAPSULE DAILY BEFORE BREAKFAST     halobetasol 0.05 % cream  Commonly known as:  ULTRAVATE  Apply topically 2 (two) times daily.     hydrALAZINE 50 MG tablet  Commonly known as:  APRESOLINE  Take 1 tablet (50 mg total) by mouth 3 (three) times daily.     INTEGRA PLUS Caps  Take 1 tablet by mouth daily.     lisinopril 20 MG tablet  Commonly known as:  PRINIVIL,ZESTRIL  TAKE 1 TABLET TWICE A DAY     loratadine 10 MG tablet  Commonly known as:  CLARITIN  Take 10 mg by mouth daily.     metoprolol succinate 50 MG 24 hr tablet  Commonly known as:  TOPROL-XL  TAKE 1 TABLET DAILY WITH OR IMMEDIATELY FOLLOWING A MEAL     mometasone 50 MCG/ACT nasal spray  Commonly known as:  NASONEX  Place 2 sprays into the nose as needed.     montelukast 10 MG tablet  Commonly known as:  SINGULAIR  Take 1 tablet (10 mg total) by mouth at bedtime.     multivitamin with minerals Tabs tablet  Take 1 tablet by mouth daily.     nitroGLYCERIN 0.4 MG SL tablet   Commonly known as:  NITROSTAT  Place 1 tablet (0.4 mg total) under the tongue every 5 (five) minutes as needed for chest pain (up to 3 doses. not within 24hrs of Viagra).     SIMPLY SALINE 0.9 % Aers  Generic drug:  Saline  Place 1 spray into the nose daily as needed (at bedtime).     traMADol 50 MG tablet  Commonly known as:  ULTRAM  One half to one tablet 3 times daily when necessary for pain     TURMERIC PO  Take 500 mg by mouth daily.     ULORIC 80 MG Tabs  Generic drug:  Febuxostat  Take 1 tablet by mouth daily.     VISION-VITE PRESERVE PO  Take 1 tablet by mouth daily.     VITAMIN B 12 PO  Take 1 tablet by mouth daily.        Discharge Assessment: Filed Vitals:   07/05/15 2058 07/06/15 0523  BP: 146/61 126/45  Pulse: 96 57  Temp: 98.2 F (36.8 C) 97.7 F (36.5 C)  Resp:  12  Skin clean, dry and intact without evidence of skin break down, no evidence of skin tears noted. IV catheter discontinued intact. Site without signs and symptoms of complications - no redness or edema noted at insertion site, patient denies c/o pain - only slight tenderness at site.  Dressing with slight pressure applied.  D/c Instructions-Education: Discharge instructions given to patient/family with verbalized understanding. D/c education completed with patient/family including follow up instructions, medication list, d/c activities limitations if indicated, with other d/c instructions as indicated by MD - patient able to verbalize understanding, all questions fully answered. Patient instructed to return to ED, call 911, or call MD for any changes in condition.  Patient escorted via [place], and D/C home via private auto.  Dayle Points, RN 07/06/2015 12:29 PM

## 2015-07-07 ENCOUNTER — Telehealth: Payer: Self-pay | Admitting: Cardiology

## 2015-07-07 DIAGNOSIS — I10 Essential (primary) hypertension: Secondary | ICD-10-CM

## 2015-07-07 NOTE — Telephone Encounter (Signed)
New message      Pt was discharged from hosp yesterday.  He has questions.  Please call

## 2015-07-07 NOTE — Telephone Encounter (Signed)
Returned call to patient.He stated he continues to feel bad after discharged from hospital.Stated he still feels weak,shaky and continues to have pain in right side of head at times.Stated he would like to be seen sooner.Appointment scheduled with Rosaria Ferries PA 07/12/15 at 2:30 pm.Stated he wanted sodium checked before appointment.Advised he can have bmet done at [facility] lab 07/11/15.

## 2015-07-07 NOTE — Telephone Encounter (Signed)
Called patient. Issue of hyponatremia which he was hospitalized for. He had some concerns about medications which might be dropping his Na+ low.  He did not get clear instruction on how to get his level back up, notes he has not been instructed to add salt to diet.  Has follow up from hosp w/ [name] on 2/15, but wanted to be seen sooner if possible.  Spoke to [place] about this, routed to her basket.

## 2015-07-12 ENCOUNTER — Encounter: Payer: Self-pay | Admitting: Physician Assistant

## 2015-07-12 ENCOUNTER — Ambulatory Visit (INDEPENDENT_AMBULATORY_CARE_PROVIDER_SITE_OTHER): Payer: BLUE CROSS/BLUE SHIELD | Admitting: Physician Assistant

## 2015-07-12 VITALS — BP 136/58 | HR 60 | Ht 67.0 in | Wt 213.1 lb

## 2015-07-12 DIAGNOSIS — R0989 Other specified symptoms and signs involving the circulatory and respiratory systems: Secondary | ICD-10-CM | POA: Diagnosis not present

## 2015-07-12 DIAGNOSIS — E871 Hypo-osmolality and hyponatremia: Secondary | ICD-10-CM

## 2015-07-12 DIAGNOSIS — I2510 Atherosclerotic heart disease of native coronary artery without angina pectoris: Secondary | ICD-10-CM | POA: Diagnosis not present

## 2015-07-12 LAB — BASIC METABOLIC PANEL
BUN: 23 mg/dL (ref 7–25)
CHLORIDE: 99 mmol/L (ref 98–110)
CO2: 28 mmol/L (ref 20–31)
Calcium: 9.1 mg/dL (ref 8.6–10.3)
Creat: 1.28 mg/dL — ABNORMAL HIGH (ref 0.70–1.18)
GLUCOSE: 112 mg/dL — AB (ref 65–99)
POTASSIUM: 5.2 mmol/L (ref 3.5–5.3)
Sodium: 133 mmol/L — ABNORMAL LOW (ref 135–146)

## 2015-07-12 NOTE — Progress Notes (Signed)
Cardiology Office Note   Date:  07/12/2015   ID:  [name], DOB [birth year], MRN [id number]  PCP:  Todd Man, MD  Cardiologist:  Dr. Martinique  Spears, [name], [name]   Chief Complaint  Patient presents with  . Hospitalization Follow-up    no chest pain, no shortness of breath, occassional edema,  occassional cramping in legs, occassional dizziness    History of Present Illness: Todd Spears is a 70 y.o. male with a history of GERD, HTN, HLD, R carotid bruit, LAD & RCA stents 2013, MV 2014 w/ inf scar, no isch, CKD III, gout, OA, asthma. Intolerant statins  For better blood pressure control, he was started on chlorthalidone. He had sudden onset of tremors and general malaise.  Admitted 01/30-02/02 w/ tremor, hyponatremia (Na+ 119), ?SIADH. DC with fluid restrictions and f/u cards.  Todd Spears presents for post hospital evaluation  Since discharge from the hospital, he has begun to feel much better. He was still feeling pretty weak for the first couple of days, but then improved. Over the last few days, he has felt pretty much normal. He is very happy about this as he felt very bad for a while.  He has not had orthopnea or PND. He feels like his activity level is pretty good. He has not had lower extremity edema. He has not really been tracking his weight but doesn't think it is elevated.  The tremors had resolved. He is doing pretty well with the fluid restrictions. He is compliant with his medications. He is not getting lightheaded or dizzy.   Past Medical History  Diagnosis Date  . Allergic rhinitis   . GERD (gastroesophageal reflux disease)   . Hyperlipidemia   . Hypertension   . Osteoarthritis   . ED (erectile dysfunction)   . Carotid bruit     r  . Renal insufficiency   . Asthma   . Obesity   . CAD (coronary artery disease)     a. s/p PTCA/stenting of the mid LAD and mid RCA 04/06/12  . Aortic stenosis     a. Mild by echo 03/24/12.  Marland Kitchen Anemia      Past Surgical History  Procedure Laterality Date  . Nm pet lymphoma      left side of neck 1999  . Percutaneous coronary stent intervention (pci-s) N/A 04/06/2012    Procedure: PERCUTANEOUS CORONARY STENT INTERVENTION (PCI-S);  Surgeon: Peter M Martinique, MD; Promus DES for 90% mid LAD and 95% mid RCA disease      Current Outpatient Prescriptions  Medication Sig Dispense Refill  . albuterol (PROVENTIL HFA;VENTOLIN HFA) 108 (90 BASE) MCG/ACT inhaler Inhale 2 puffs into the lungs every 6 (six) hours as needed. For shortness of breath 2 Inhaler 1  . amLODipine (NORVASC) 10 MG tablet Take 1 tablet (10 mg total) by mouth daily. 100 tablet 3  . aspirin 81 MG chewable tablet Chew 81 mg by mouth daily.    Marland Kitchen COLCRYS 0.6 MG tablet Take 0.6 mg by mouth daily as needed (for gout flareups).     . Cyanocobalamin (VITAMIN B 12 PO) Take 1 tablet by mouth daily.     Marland Kitchen esomeprazole (NEXIUM) 40 MG capsule TAKE 1 CAPSULE DAILY BEFORE BREAKFAST 100 capsule 2  . FeFum-FePoly-FA-B Cmp-C-Biot (INTEGRA PLUS) CAPS Take 1 tablet by mouth daily.    . halobetasol (ULTRAVATE) 0.05 % cream Apply topically 2 (two) times daily. 50 g 4  . lisinopril (PRINIVIL,ZESTRIL) 20 MG tablet  TAKE 1 TABLET TWICE A DAY 200 tablet 2  . loratadine (CLARITIN) 10 MG tablet Take 10 mg by mouth daily.    . metoprolol succinate (TOPROL-XL) 50 MG 24 hr tablet TAKE 1 TABLET DAILY WITH OR IMMEDIATELY FOLLOWING A MEAL 100 tablet 3  . mometasone (NASONEX) 50 MCG/ACT nasal spray Place 2 sprays into the nose as needed. 34 g 11  . montelukast (SINGULAIR) 10 MG tablet Take 1 tablet (10 mg total) by mouth at bedtime. 30 tablet 3  . Multiple Vitamin (MULTIVITAMIN WITH MINERALS) TABS tablet Take 1 tablet by mouth daily.    . Multiple Vitamins-Minerals (VISION-VITE PRESERVE PO) Take 1 tablet by mouth daily.    . nitroGLYCERIN (NITROSTAT) 0.4 MG SL tablet Place 1 tablet (0.4 mg total) under the tongue every 5 (five) minutes as needed for chest pain (up to  3 doses. not within 24hrs of Viagra). 25 tablet 6  . Saline (SIMPLY SALINE) 0.9 % AERS Place 1 spray into the nose daily as needed (at bedtime).    . traMADol (ULTRAM) 50 MG tablet One half to one tablet 3 times daily when necessary for pain 100 tablet 3  . TURMERIC PO Take 500 mg by mouth daily.    Marland Kitchen ULORIC 80 MG TABS Take 1 tablet by mouth daily.    . hydrALAZINE (APRESOLINE) 25 MG tablet Take 1 tablet by mouth 3 (three) times daily. Take 1 tab by mouth tid     No current facility-administered medications for this visit.    Allergies:   Prednisone and Crestor    Social History:  The patient  reports that he has quit smoking. He has never used smokeless tobacco. He reports that he does not drink alcohol or use illicit drugs.   Family History:  The patient's family history includes Coronary artery disease in his father; Heart attack in his father; Heart disease in his father.    ROS:  Please see the history of present illness. All other systems are reviewed and negative.    PHYSICAL EXAM: VS:  BP 136/58 mmHg  Pulse 60  Ht 5\' 7"  (1.702 m)  Wt 213 lb 1 oz (96.645 kg)  BMI 33.36 kg/m2 , BMI Body mass index is 33.36 kg/(m^2). GEN: Well nourished, well developed, male in no acute distress HEENT: normal for age  Neck: Minimal JVD, positive hepatojugular reflux, bilateral carotid bruit, no masses Cardiac: RRR; no murmur, no rubs, or gallops Respiratory:  Decreased breath sounds bases bilaterally, normal work of breathing GI: soft, nontender, nondistended, + BS MS: no deformity or atrophy; no edema; distal pulses are 2+ in all 4 extremities  Skin: warm and dry, no rash Neuro:  Strength and sensation are intact Psych: euthymic mood, full affect   EKG:  EKG is ordered today. The ekg ordered today demonstrates sinus rhythm rate 61 with no acute ischemic changes. Decreased amplitude in the inferior leads and other morphology is unchanged from previous ECG   Recent Labs: 07/03/2015:  ALT 25 07/04/2015: Hemoglobin 10.6*; Magnesium 1.7; Platelets 238; TSH 2.659 07/11/2015: BUN 23; Creat 1.28*; Potassium 5.2; Sodium 133*    Lipid Panel    Component Value Date/Time   CHOL 180 06/14/2014 0807   TRIG 98.0 06/14/2014 0807   HDL 51.30 06/14/2014 0807   CHOLHDL 4 06/14/2014 0807   VLDL 19.6 06/14/2014 0807   LDLCALC 109* 06/14/2014 0807   LDLDIRECT 164.9 12/10/2010 0810     Wt Readings from Last 3 Encounters:  07/12/15 213 lb 1  oz (96.645 kg)  07/06/15 208 lb 1.8 oz (94.4 kg)  05/23/15 212 lb (96.163 kg)     Other studies Reviewed: Additional studies/ records that were reviewed today include: Hospital records, office records and testing.  ASSESSMENT AND PLAN:  1. Hyponatremia: Felt multifactorial secondary to chlorthalidone and possibly SIADH. His tremor has improved. His BMET was rechecked yesterday per Dr. Doug Sou orders and is improved. His creatinine is slightly elevated, but not much above previous values. He is to continue current therapy with fluid restrictions. I'll forward this to Dr. Martinique and Dr Sherren Mocha for input on management. He has mild volume overload by exam and is to continue daily weights.  2. CAD: He is having no ongoing ischemic symptoms. He is on good medical therapy with aspirin, hydralazine, ACE inhibitor and beta blocker. However, he never had chest pain and his CAD was only picked up because of an abnormal stress test. It has been 2 years since his last stress test. Therefore, we will schedule a stress test to be done as an outpatient, follow-up with Dr. Martinique afterwards.  3. Cerebrovascular disease: He has not had any recent imaging of his carotids, we will go ahead and schedule an ultrasound.   Current medicines are reviewed at length with the patient today.  The patient does not have concerns regarding medicines.  The following changes have been made:  no change  Labs/ tests ordered today include: ECG   Disposition:   FU with Dr.  Martinique  Signed, Lenoard Aden  07/12/2015 2:52 PM    [address], [place], [place]  [address] Phone: [phone]; Fax: [phone]

## 2015-07-12 NOTE — Patient Instructions (Signed)
Medication Instructions: Please continue your current medications.  Labwork: NONE  Testing/Procedures: 1. Your physician has requested that you have a carotid duplex. This test is an ultrasound of the carotid arteries in your neck. It looks at blood flow through these arteries that supply the brain with blood. Allow one hour for this exam. There are no restrictions or special instructions.  2. Your physician has requested that you have a lexiscan myoview. For further information please visit [email or web address]. Please follow instruction sheet, as given.  Follow-up: Please keep your previously scheduled appointment with Dr Martinique.  If you need a refill on your cardiac medications before your next appointment, please call your pharmacy.   Your physician recommends that you weigh, daily, at the same time every day, and in the same amount of clothing. Please record your daily weights on the handout provided and bring it to your next appointment.  You have no sodium restrictions. Continue fluid restrictions.

## 2015-07-20 ENCOUNTER — Ambulatory Visit: Payer: Medicare Other | Admitting: Physician Assistant

## 2015-07-26 ENCOUNTER — Encounter: Payer: Self-pay | Admitting: Internal Medicine

## 2015-07-26 ENCOUNTER — Telehealth (HOSPITAL_COMMUNITY): Payer: Self-pay

## 2015-07-26 NOTE — Telephone Encounter (Signed)
Encounter complete. 

## 2015-07-28 ENCOUNTER — Ambulatory Visit (HOSPITAL_BASED_OUTPATIENT_CLINIC_OR_DEPARTMENT_OTHER)
Admission: RE | Admit: 2015-07-28 | Discharge: 2015-07-28 | Disposition: A | Discharge status: Home / Self Care | Payer: BLUE CROSS/BLUE SHIELD | Source: Ambulatory Visit | Attending: Physician Assistant | Admitting: Physician Assistant

## 2015-07-28 ENCOUNTER — Ambulatory Visit (HOSPITAL_COMMUNITY)
Admission: RE | Admit: 2015-07-28 | Discharge: 2015-07-28 | Disposition: A | Discharge status: Home / Self Care | Payer: BLUE CROSS/BLUE SHIELD | Source: Ambulatory Visit | Attending: Cardiology | Admitting: Cardiology

## 2015-07-28 ENCOUNTER — Other Ambulatory Visit: Payer: Self-pay | Admitting: Family Medicine

## 2015-07-28 DIAGNOSIS — Z6833 Body mass index (BMI) 33.0-33.9, adult: Secondary | ICD-10-CM | POA: Insufficient documentation

## 2015-07-28 DIAGNOSIS — I2510 Atherosclerotic heart disease of native coronary artery without angina pectoris: Secondary | ICD-10-CM | POA: Diagnosis present

## 2015-07-28 DIAGNOSIS — M79604 Pain in right leg: Secondary | ICD-10-CM | POA: Insufficient documentation

## 2015-07-28 DIAGNOSIS — I10 Essential (primary) hypertension: Secondary | ICD-10-CM | POA: Diagnosis not present

## 2015-07-28 DIAGNOSIS — Z8249 Family history of ischemic heart disease and other diseases of the circulatory system: Secondary | ICD-10-CM | POA: Insufficient documentation

## 2015-07-28 DIAGNOSIS — R0989 Other specified symptoms and signs involving the circulatory and respiratory systems: Secondary | ICD-10-CM | POA: Insufficient documentation

## 2015-07-28 DIAGNOSIS — M79605 Pain in left leg: Secondary | ICD-10-CM | POA: Diagnosis not present

## 2015-07-28 DIAGNOSIS — E663 Overweight: Secondary | ICD-10-CM | POA: Insufficient documentation

## 2015-07-28 DIAGNOSIS — R42 Dizziness and giddiness: Secondary | ICD-10-CM | POA: Insufficient documentation

## 2015-07-28 DIAGNOSIS — I6523 Occlusion and stenosis of bilateral carotid arteries: Secondary | ICD-10-CM | POA: Insufficient documentation

## 2015-07-28 DIAGNOSIS — E785 Hyperlipidemia, unspecified: Secondary | ICD-10-CM | POA: Diagnosis not present

## 2015-07-28 DIAGNOSIS — R9439 Abnormal result of other cardiovascular function study: Secondary | ICD-10-CM | POA: Insufficient documentation

## 2015-07-28 LAB — MYOCARDIAL PERFUSION IMAGING
CHL CUP NUCLEAR SRS: 4
CHL CUP NUCLEAR SSS: 7
CSEPPHR: 96 {beats}/min
LV dias vol: 123 mL
LVSYSVOL: 54 mL
NUC STRESS TID: 1
Rest HR: 55 {beats}/min
SDS: 3

## 2015-07-28 MED ORDER — REGADENOSON 0.4 MG/5ML IV SOLN
0.4000 mg | Freq: Once | INTRAVENOUS | Status: AC
  Administered 2015-07-28: 0.4 mg via INTRAVENOUS

## 2015-07-28 MED ORDER — TECHNETIUM TC 99M SESTAMIBI GENERIC - CARDIOLITE
10.9000 | Freq: Once | INTRAVENOUS | Status: AC | PRN
Start: 2015-07-28 — End: 2015-07-28
  Administered 2015-07-28: 10.9 via INTRAVENOUS

## 2015-07-28 MED ORDER — TECHNETIUM TC 99M SESTAMIBI GENERIC - CARDIOLITE
32.4000 | Freq: Once | INTRAVENOUS | Status: AC | PRN
  Administered 2015-07-28: 32.4 via INTRAVENOUS

## 2015-08-09 ENCOUNTER — Ambulatory Visit (INDEPENDENT_AMBULATORY_CARE_PROVIDER_SITE_OTHER): Payer: BLUE CROSS/BLUE SHIELD | Admitting: Podiatry

## 2015-08-09 ENCOUNTER — Encounter: Payer: Self-pay | Admitting: Podiatry

## 2015-08-09 DIAGNOSIS — M79676 Pain in unspecified toe(s): Secondary | ICD-10-CM | POA: Diagnosis not present

## 2015-08-09 DIAGNOSIS — B351 Tinea unguium: Secondary | ICD-10-CM | POA: Diagnosis not present

## 2015-08-09 DIAGNOSIS — Q828 Other specified congenital malformations of skin: Secondary | ICD-10-CM

## 2015-08-09 NOTE — Progress Notes (Addendum)
Patient ID: Todd Spears, male   DOB: 08/31/1945, 70 y.o.   MRN: [id number] Complaint:  Visit Type: Patient returns to my office for continued preventative foot care services. Complaint: Patient states" my nails have grown long and thick and become painful to walk and wear shoes". The patient presents for preventative foot care services. No changes to ROS.  Painful callus under the outside ball of both feet.  Podiatric Exam: Vascular: dorsalis pedis and posterior tibial pulses are palpable bilateral. Capillary return is immediate. Temperature gradient is WNL. Skin turgor WNL  Sensorium: Normal Semmes Weinstein monofilament test. Normal tactile sensation bilaterally. Nail Exam: Pt has thick disfigured discolored nails with subungual debris noted bilateral entire nail hallux through fifth toenails Ulcer Exam: There is no evidence of ulcer or pre-ulcerative changes or infection. Orthopedic Exam: Muscle tone and strength are WNL. No limitations in general ROM. No crepitus or effusions noted. Foot type and digits show no abnormalities. Bony prominences are unremarkable. Skin:  Porokeratosis sub 5th metatarsal head both feet.. No infection or ulcers  Diagnosis:  Onychomycosis, , Pain in right toe, pain in left toes. Porokeratosis B/L  Treatment & Plan Procedures and Treatment: Consent by patient was obtained for treatment procedures. The patient understood the discussion of treatment and procedures well. All questions were answered thoroughly reviewed. Debridement of mycotic and hypertrophic toenails, 1 through 5 bilateral and clearing of subungual debris. No ulceration, no infection noted. Debride porokeratosis Return Visit-Office Procedure: Patient instructed to return to the office for a follow up visit 3 months for continued evaluation and treatment.  Izora Benn DPM 

## 2015-08-10 ENCOUNTER — Ambulatory Visit (INDEPENDENT_AMBULATORY_CARE_PROVIDER_SITE_OTHER): Payer: BLUE CROSS/BLUE SHIELD | Admitting: Cardiology

## 2015-08-10 ENCOUNTER — Encounter: Payer: Self-pay | Admitting: Cardiology

## 2015-08-10 VITALS — BP 148/64 | HR 54 | Ht 67.0 in | Wt 216.6 lb

## 2015-08-10 DIAGNOSIS — E785 Hyperlipidemia, unspecified: Secondary | ICD-10-CM

## 2015-08-10 DIAGNOSIS — I10 Essential (primary) hypertension: Secondary | ICD-10-CM

## 2015-08-10 DIAGNOSIS — E871 Hypo-osmolality and hyponatremia: Secondary | ICD-10-CM

## 2015-08-10 DIAGNOSIS — I779 Disorder of arteries and arterioles, unspecified: Secondary | ICD-10-CM | POA: Diagnosis not present

## 2015-08-10 DIAGNOSIS — I2510 Atherosclerotic heart disease of native coronary artery without angina pectoris: Secondary | ICD-10-CM

## 2015-08-10 DIAGNOSIS — I739 Peripheral vascular disease, unspecified: Secondary | ICD-10-CM

## 2015-08-10 DIAGNOSIS — I350 Nonrheumatic aortic (valve) stenosis: Secondary | ICD-10-CM

## 2015-08-10 NOTE — Progress Notes (Signed)
Todd Spears Date of Birth: 08-14-1945 Medical Record [id number]  History of Present Illness: Todd Spears is seen back today for follow up CAD. He is s/p 2 vessel PCI with Promus DES for 90% mid LAD and 95% mid RCA disease on 04/06/2012. His EF is normal at 55%. At that time he really had no significant anginal symptoms but had an abnormal stress test.  Myoview in Nov. 2014 showed a fixed inferior defect without ischemia. Other issues include CKD, HTN, obesity, gout,,OA, ED, carotid bruit and asthma.  He was admitted in January with tremors and hyponatremia. Chlorthalidone was discontinued. He is feeling better. He had a [place] Myoview that showed a fixed inferior defect like 2014. No ischemia and normal EF. He had carotid dopplers showing 1-39% RICA and A999333 LICA stenoses. He denies any chest pain or SOB. No TIA or CVA symptoms. 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    Current Outpatient Prescriptions on File Prior to Visit  Medication Sig Dispense Refill  . albuterol (PROVENTIL HFA;VENTOLIN HFA) 108 (90 BASE) MCG/ACT inhaler Inhale 2 puffs into the lungs every 6 (six) hours as needed. For shortness of breath 2 Inhaler 1  . amLODipine (NORVASC) 10 MG tablet TAKE 1 TABLET DAILY 100 tablet 2  . aspirin 81 MG chewable tablet Chew 81 mg by mouth daily.    Marland Kitchen COLCRYS 0.6 MG tablet Take 0.6 mg by mouth daily as needed (for gout flareups).     . Cyanocobalamin (VITAMIN B 12 PO) Take 1 tablet by mouth daily.     Marland Kitchen esomeprazole (NEXIUM) 40 MG capsule TAKE 1 CAPSULE DAILY BEFORE BREAKFAST 100 capsule 2  . FeFum-FePoly-FA-B Cmp-C-Biot (INTEGRA PLUS) CAPS Take 1 tablet by mouth daily.    . halobetasol (ULTRAVATE) 0.05 % cream Apply topically 2 (two) times daily. (Patient taking differently: Apply topically as needed. ) 50 g 4  . hydrALAZINE (APRESOLINE) 25 MG tablet Take 1 tablet by mouth 3 (three) times daily. Take 1 tab by mouth tid    . lisinopril (PRINIVIL,ZESTRIL) 20  MG tablet TAKE 1 TABLET TWICE A DAY 200 tablet 2  . loratadine (CLARITIN) 10 MG tablet Take 10 mg by mouth daily.    . metoprolol succinate (TOPROL-XL) 50 MG 24 hr tablet TAKE 1 TABLET DAILY WITH OR IMMEDIATELY FOLLOWING A MEAL (Patient taking differently: as needed. TAKE 1 TABLET DAILY WITH OR IMMEDIATELY FOLLOWING A MEAL) 100 tablet 3  . mometasone (NASONEX) 50 MCG/ACT nasal spray Place 2 sprays into the nose as needed. 34 g 11  . montelukast (SINGULAIR) 10 MG tablet Take 1 tablet (10 mg total) by mouth at bedtime. 30 tablet 3  . Multiple Vitamin (MULTIVITAMIN WITH MINERALS) TABS tablet Take 1 tablet by mouth daily.    . Multiple Vitamins-Minerals (VISION-VITE PRESERVE PO) Take 1 tablet by mouth daily.    . nitroGLYCERIN (NITROSTAT) 0.4 MG SL tablet Place 1 tablet (0.4 mg total) under the tongue every 5 (five) minutes as needed for chest pain (up to 3 doses. not within 24hrs of Viagra). 25 tablet 6  . Saline (SIMPLY SALINE) 0.9 % AERS Place 1 spray into the nose daily as needed (at bedtime).    . traMADol (ULTRAM) 50 MG tablet One half to one tablet 3 times daily when necessary for pain 100 tablet 3  . TURMERIC PO Take 500 mg by mouth daily.    Marland Kitchen ULORIC 80 MG TABS Take 1 tablet by mouth daily.  No current facility-administered medications on file prior to visit.    Allergies  Allergen Reactions  . Prednisone Shortness Of Breath and Swelling  . Crestor [Rosuvastatin Calcium] Other (See Comments)    myalgia    Past Medical History  Diagnosis Date  . Allergic rhinitis   . GERD (gastroesophageal reflux disease)   . Hyperlipidemia   . Hypertension   . Osteoarthritis   . ED (erectile dysfunction)   . Carotid bruit     r  . Renal insufficiency   . Asthma   . Obesity   . CAD (coronary artery disease)     a. s/p PTCA/stenting of the mid LAD and mid RCA 04/06/12  . Aortic stenosis     a. Mild by echo 03/24/12.  Marland Kitchen Anemia     Past Surgical History  Procedure Laterality Date  . Nm  pet lymphoma      left side of neck 1999  . Percutaneous coronary stent intervention (pci-s) N/A 04/06/2012    Procedure: PERCUTANEOUS CORONARY STENT INTERVENTION (PCI-S);  Surgeon: Mitcheal Sweetin M Martinique, MD; Promus DES for 90% mid LAD and 95% mid RCA disease      History  Smoking status  . Former Smoker  Smokeless tobacco  . Never Used    History  Alcohol Use No    Family History  Problem Relation Age of Onset  . Hypertension    . Stroke    . Coronary artery disease Father     in his 26s  . Heart disease Father   . Heart attack Father     Review of Systems: The review of systems is per the HPI.  All other systems were reviewed and are negative.  Physical Exam: BP 148/64 mmHg  Pulse 54  Ht 5\' 7"  (1.702 m)  Wt 98.249 kg (216 lb 9.6 oz)  BMI 33.92 kg/m2 Patient is an obese WM in no acute distress. Skin is warm and dry. Color is normal.  HEENT is unremarkable. Normocephalic/atraumatic. PERRL. Sclera are nonicteric. Neck is supple. No masses. No JVD. Lungs are clear. Cardiac exam shows a regular rate and rhythm. normal S1-2. No gallop or murmur. Abdomen is soft. Extremities show 1+ right ankle edema.  Gait and ROM are intact. No gross neurologic deficits noted.  LABORATORY DATA: Lab Results  Component Value Date   WBC 7.6 07/04/2015   HGB 10.6* 07/04/2015   HCT 29.6* 07/04/2015   PLT 238 07/04/2015   GLUCOSE 112* 07/11/2015   CHOL 180 06/14/2014   TRIG 98.0 06/14/2014   HDL 51.30 06/14/2014   LDLDIRECT 164.9 12/10/2010   LDLCALC 109* 06/14/2014   ALT 25 07/03/2015   AST 30 07/03/2015   NA 133* 07/11/2015   K 5.2 07/11/2015   CL 99 07/11/2015   CREATININE 1.28* 07/11/2015   BUN 23 07/11/2015   CO2 28 07/11/2015   TSH 2.659 07/04/2015   PSA 0.58 06/14/2014   INR 1.0 04/03/2012   Lexiscan myoview 07/28/15: Study Highlights     There was no ST segment deviation noted during stress.  The left ventricular ejection fraction is normal (55-65%).  Nuclear stress EF:  56%.  Defect 1: There is a medium defect of moderate severity present in the basal inferoseptal, basal inferior, mid inferior and apical inferior location. In setting of normal LVF, this is consistent with diaphragmatic attenuation artifact. No ischemia noted.  This is a low risk study.    Addendum by Sueanne Margarita, MD on Fri Jul 28, 2015 2:03  PM    There was no ST segment deviation noted during stress.  The left ventricular ejection fraction is normal (55-65%).  Nuclear stress EF: 56%.  Defect 1: There is a medium defect of moderate severity present in the basal inferoseptal, basal inferior, mid inferior and apical inferior location. In setting of normal LVF, this is consistent with diaphragmatic attenuation artifact. No ischemia noted.  This is a low risk study.      Assessment / Plan:  1. CAD - status post 2 vessel PCI with DES in November 2013- doing well. Continue current therapy with aspirin.  Myoview study in February was low risk with an area of inferior scar and no ischemia. Unchanged from 2014. We will continue medical therapy and risk factor modification.   2. HTN - blood pressure is well controlled. Will monitor on current therapy.  3. Hyponatremia secondary to chlorthalidone. Resolved.   4. CKD  5. Hyperlipidemia. Intolerant of statins. Continue with  dietary modification and weight loss.   6. Carotid arterial disease with moderate A999333 LICA stenosis. Asymptomatic. Repeat dopplers in one year.  7. History of mild Aortic stenosis. Last Echo in 10/13. Will update now.

## 2015-08-10 NOTE — Patient Instructions (Signed)
Continue your current therapy  I will schedule you for an Echocardiogram  We will repeat carotid dopplers in one year.   I will see you in 6 months

## 2015-08-17 ENCOUNTER — Ambulatory Visit: Payer: BLUE CROSS/BLUE SHIELD | Admitting: Podiatry

## 2015-08-21 ENCOUNTER — Other Ambulatory Visit: Payer: Self-pay | Admitting: Family Medicine

## 2015-08-25 ENCOUNTER — Ambulatory Visit (HOSPITAL_COMMUNITY): Payer: BLUE CROSS/BLUE SHIELD | Attending: Cardiovascular Disease

## 2015-08-25 ENCOUNTER — Other Ambulatory Visit: Payer: Self-pay

## 2015-08-25 DIAGNOSIS — I2510 Atherosclerotic heart disease of native coronary artery without angina pectoris: Secondary | ICD-10-CM | POA: Insufficient documentation

## 2015-08-25 DIAGNOSIS — E785 Hyperlipidemia, unspecified: Secondary | ICD-10-CM | POA: Diagnosis not present

## 2015-08-25 DIAGNOSIS — I10 Essential (primary) hypertension: Secondary | ICD-10-CM | POA: Diagnosis not present

## 2015-09-04 ENCOUNTER — Telehealth: Payer: Self-pay | Admitting: Family Medicine

## 2015-09-04 DIAGNOSIS — R1031 Right lower quadrant pain: Secondary | ICD-10-CM

## 2015-09-04 NOTE — Telephone Encounter (Signed)
Dr Sherren Mocha would like for patient to go to neurology.  Patient is aware and referral placed.

## 2015-09-04 NOTE — Telephone Encounter (Signed)
Pt has pain in upper right leg pain. Pt states Dr Sherren Mocha is aware of this ongoing pain. Had referred him to Dr Donne Hazel. Pt states he either needs to see Dr Sherren Mocha asa,  or would like a muscle relaxer sent to   [facility]/ [place]

## 2015-09-12 ENCOUNTER — Ambulatory Visit (INDEPENDENT_AMBULATORY_CARE_PROVIDER_SITE_OTHER): Payer: BLUE CROSS/BLUE SHIELD | Admitting: Neurology

## 2015-09-12 ENCOUNTER — Encounter: Payer: Self-pay | Admitting: Neurology

## 2015-09-12 ENCOUNTER — Telehealth: Payer: Self-pay | Admitting: Neurology

## 2015-09-12 VITALS — BP 146/68 | HR 58 | Ht 67.0 in | Wt 216.0 lb

## 2015-09-12 DIAGNOSIS — R1031 Right lower quadrant pain: Secondary | ICD-10-CM

## 2015-09-12 DIAGNOSIS — I10 Essential (primary) hypertension: Secondary | ICD-10-CM

## 2015-09-12 DIAGNOSIS — R1030 Lower abdominal pain, unspecified: Secondary | ICD-10-CM

## 2015-09-12 MED ORDER — PREGABALIN 50 MG PO CAPS: 50.0000 mg | ORAL_CAPSULE | Freq: Three times a day (TID) | ORAL | Status: DC

## 2015-09-12 NOTE — Telephone Encounter (Signed)
Todd Spears 01/08/1946 was seen today by Dr. Tomi Likens and wanted to know how he needed to take the Lyrica that was prescribed. Thank you

## 2015-09-12 NOTE — Patient Instructions (Signed)
1.  I want to check to see if the pain could be due to a pinched nerve from the back.  We will get MRI of lumbar spine. 2.  Assuming that this is nerve pain, I will give you Lyrica 50mg  three times daily, which is used to treat nerve pain 3.  Follow up.  Further recommendations pending MRI results.

## 2015-09-12 NOTE — Telephone Encounter (Signed)
Left detailed message on pt's vm machine.

## 2015-09-12 NOTE — Progress Notes (Signed)
NEUROLOGY CONSULTATION NOTE  Todd Spears MRN: [id number] DOB: January 09, 1946  Referring provider: Dr. Sherren Mocha Primary care provider: Dr. Sherren Mocha  Reason for consult:  Right groin pain  HISTORY OF PRESENT ILLNESS: Todd Spears is a 70 year old right-handed male with chronic kidney disease stage 3, hypertension, CAD, hyperlipidemia, and carotid artery disease who presents for right groin pain.  History obtained by patient and PCP note.  Imaging of lumbar MRI reviewed.  Report of CT pelvis reviewed.  Over a year ago, he walked up a flight of stairs and felt something pull in his right lower quadrant.  It passed.  Several months later, he was getting into his car and felt excruciating pain in his right lower quadrant, radiating into the right groin.  There was a burning quality to it.  Due to concern for hernia, he was referred to a surgeon.  CT pelvis with contrast performed on 10/12/14 showed "fat containing bilateral inguinal hernias versus lipomatosis hypertrophy of the spermatic cord". The surgeon did not believe he had a hernia and thought it was either muscle strain or nerve issue.  He denies back pain, pain and numbness radiating into the thigh, or weakness.  He denies bowel or bladder dysfunction.  He only notices the pain when he is standing.  He previously had an MRI of lumbar spine performed on 10/05/11 for evaluation of leg pain.  It showed disc protrusion at L4-5 causing left L4 nerve root irritation, degenerative disc disease at L3-4 and L5-S1, and mild bilateral foraminal encroachment at L5-S1.    He takes tramadol, which helps.  Advil is ineffective.  PAST MEDICAL HISTORY: Past Medical History  Diagnosis Date  . Allergic rhinitis   . GERD (gastroesophageal reflux disease)   . Hyperlipidemia   . Hypertension   . Osteoarthritis   . ED (erectile dysfunction)   . Carotid bruit     r  . Renal insufficiency   . Asthma   . Obesity   . CAD (coronary artery disease)     a. s/p  PTCA/stenting of the mid LAD and mid RCA 04/06/12  . Aortic stenosis     a. Mild by echo 03/24/12.  Marland Kitchen Anemia     PAST SURGICAL HISTORY: Past Surgical History  Procedure Laterality Date  . Nm pet lymphoma      left side of neck 1999  . Percutaneous coronary stent intervention (pci-s) N/A 04/06/2012    Procedure: PERCUTANEOUS CORONARY STENT INTERVENTION (PCI-S);  Surgeon: Peter M Martinique, MD; Promus DES for 90% mid LAD and 95% mid RCA disease      MEDICATIONS: Current Outpatient Prescriptions on File Prior to Visit  Medication Sig Dispense Refill  . albuterol (PROVENTIL HFA;VENTOLIN HFA) 108 (90 BASE) MCG/ACT inhaler Inhale 2 puffs into the lungs every 6 (six) hours as needed. For shortness of breath 2 Inhaler 1  . amLODipine (NORVASC) 10 MG tablet TAKE 1 TABLET DAILY 100 tablet 2  . aspirin 81 MG chewable tablet Chew 81 mg by mouth daily.    Marland Kitchen COLCRYS 0.6 MG tablet Take 0.6 mg by mouth daily as needed (for gout flareups).     . Cyanocobalamin (VITAMIN B 12 PO) Take 1 tablet by mouth daily.     Marland Kitchen esomeprazole (NEXIUM) 40 MG capsule TAKE 1 CAPSULE DAILY BEFORE BREAKFAST 100 capsule 2  . FeFum-FePoly-FA-B Cmp-C-Biot (INTEGRA PLUS) CAPS Take 1 tablet by mouth daily.    . halobetasol (ULTRAVATE) 0.05 % cream Apply topically 2 (two) times daily. (  Patient taking differently: Apply topically as needed. ) 50 g 4  . hydrALAZINE (APRESOLINE) 25 MG tablet Take 1 tablet by mouth 3 (three) times daily. Take 1 tab by mouth tid    . lisinopril (PRINIVIL,ZESTRIL) 20 MG tablet TAKE 1 TABLET TWICE A DAY 200 tablet 2  . loratadine (CLARITIN) 10 MG tablet Take 10 mg by mouth daily.    . metoprolol succinate (TOPROL-XL) 50 MG 24 hr tablet TAKE 1 TABLET DAILY WITH OR IMMEDIATELY FOLLOWING A MEAL 100 tablet 2  . mometasone (NASONEX) 50 MCG/ACT nasal spray Place 2 sprays into the nose as needed. 34 g 11  . montelukast (SINGULAIR) 10 MG tablet Take 1 tablet (10 mg total) by mouth at bedtime. 30 tablet 3  .  Multiple Vitamin (MULTIVITAMIN WITH MINERALS) TABS tablet Take 1 tablet by mouth daily.    . Multiple Vitamins-Minerals (VISION-VITE PRESERVE PO) Take 1 tablet by mouth daily.    . nitroGLYCERIN (NITROSTAT) 0.4 MG SL tablet Place 1 tablet (0.4 mg total) under the tongue every 5 (five) minutes as needed for chest pain (up to 3 doses. not within 24hrs of Viagra). 25 tablet 6  . Saline (SIMPLY SALINE) 0.9 % AERS Place 1 spray into the nose daily as needed (at bedtime).    . traMADol (ULTRAM) 50 MG tablet One half to one tablet 3 times daily when necessary for pain 100 tablet 3  . TURMERIC PO Take 500 mg by mouth daily.    Marland Kitchen ULORIC 80 MG TABS Take 1 tablet by mouth daily.     No current facility-administered medications on file prior to visit.    ALLERGIES: Allergies  Allergen Reactions  . Prednisone Shortness Of Breath and Swelling  . Crestor [Rosuvastatin Calcium] Other (See Comments)    myalgia    FAMILY HISTORY: Family History  Problem Relation Age of Onset  . Hypertension    . Stroke    . Coronary artery disease Father     in his 75s  . Heart disease Father   . Heart attack Father     SOCIAL HISTORY: Social History   Social History  . Marital Status: Married    Spouse Name: N/A  . Number of Children: 1  . Years of Education: N/A   Occupational History  .     Social History Main Topics  . Smoking status: Former Research scientist (life sciences)  . Smokeless tobacco: Never Used  . Alcohol Use: No  . Drug Use: No  . Sexual Activity: Not Currently   Other Topics Concern  . Not on file   Social History Narrative   Retired   Married   Regular exercise- no    REVIEW OF SYSTEMS: Constitutional: No fevers, chills, or sweats, no generalized fatigue, change in appetite Eyes: No visual changes, double vision, eye pain Ear, nose and throat: No hearing loss, ear pain, nasal congestion, sore throat Cardiovascular: No chest pain, palpitations Respiratory:  No shortness of breath at rest or with  exertion, wheezes GastrointestinaI: No nausea, vomiting, diarrhea, abdominal pain, fecal incontinence Genitourinary:  No dysuria, urinary retention or frequency Musculoskeletal:  No neck pain, back pain Integumentary: No rash, pruritus, skin lesions Neurological: as above Psychiatric: No depression, insomnia, anxiety Endocrine: No palpitations, fatigue, diaphoresis, mood swings, change in appetite, change in weight, increased thirst Hematologic/Lymphatic:  No anemia, purpura, petechiae. Allergic/Immunologic: no itchy/runny eyes, nasal congestion, recent allergic reactions, rashes  PHYSICAL EXAM: Filed Vitals:   09/12/15 0751  BP: 146/68  Pulse: 58  General: No acute distress.  Patient appears well-groomed.  Head:  Normocephalic/atraumatic Eyes:  fundi unremarkable, without vessel changes, exudates, hemorrhages or papilledema. Neck: supple, no paraspinal tenderness, full range of motion Back: No paraspinal tenderness Heart: regular rate and rhythm Lungs: Clear to auscultation bilaterally. Vascular: No carotid bruits. Neurological Exam: Mental status: alert and oriented to person, place, and time, recent and remote memory intact, fund of knowledge intact, attention and concentration intact, speech fluent and not dysarthric, language intact. Cranial nerves: CN I: not tested CN II: pupils equal, round and reactive to light, visual fields intact, fundi unremarkable, without vessel changes, exudates, hemorrhages or papilledema. CN III, IV, VI:  full range of motion, no nystagmus, no ptosis CN V: facial sensation intact CN VII: upper and lower face symmetric CN VIII: hearing intact CN IX, X: gag intact, uvula midline CN XI: sternocleidomastoid and trapezius muscles intact CN XII: tongue midline Bulk & Tone: normal, no fasciculations. Motor:  5/5 throughout  Sensation:  Pinprick and vibration sensation intact. Deep Tendon Reflexes:  2+ throughout, toes downgoing.  Finger to nose  testing:  Without dysmetria.  Heel to shin:  Without dysmetria.  Gait:  Normal station and stride.  Able to turn and tandem walk. Romberg negative.  IMPRESSION: Right groin pain.  Will want to rule out upper lumbar radiculopathy HTN  PLAN: 1.  MRI of lumbar spine 2.  Assuming this is a nerve pain, we will try Lyrica 50mg  three times daily (he had side effects to gabapentin in the past) 3.  Further recommendations pending MRI results. 4.  Follow up elevated blood pressure with PCP 5.  Follow up.  Thank you for allowing me to take part in the care of this patient.  Metta Clines, DO  CC:  Stevie Kern, MD

## 2015-09-25 ENCOUNTER — Encounter: Payer: Self-pay | Admitting: Internal Medicine

## 2015-09-27 ENCOUNTER — Ambulatory Visit
Admission: RE | Admit: 2015-09-27 | Discharge: 2015-09-27 | Disposition: A | Discharge status: Home / Self Care | Payer: BLUE CROSS/BLUE SHIELD | Source: Ambulatory Visit | Attending: Neurology | Admitting: Neurology

## 2015-09-27 DIAGNOSIS — R1031 Right lower quadrant pain: Secondary | ICD-10-CM

## 2015-09-27 MED ORDER — GADOBENATE DIMEGLUMINE 529 MG/ML IV SOLN
10.0000 mL | Freq: Once | INTRAVENOUS | Status: AC | PRN
  Administered 2015-09-27: 10 mL via INTRAVENOUS

## 2015-09-28 ENCOUNTER — Other Ambulatory Visit: Payer: Self-pay | Admitting: Family Medicine

## 2015-09-29 ENCOUNTER — Telehealth: Payer: Self-pay

## 2015-09-29 NOTE — Telephone Encounter (Signed)
-----   Message from Pieter Partridge, DO sent at 09/29/2015 12:07 PM EDT ----- The MRI does show disc bulges that are pinching nerves, which could explain the groin pain.  If the Lyrica is working, we can continue that.  Otherwise, we can refer to a pain specialist for epidural injection.

## 2015-09-29 NOTE — Telephone Encounter (Signed)
Results were left on pt's voicemail, with instructions to call back with any questions or concerns in relation to results. Mychart message also sent.

## 2015-10-02 NOTE — Telephone Encounter (Signed)
Please see mychart message.

## 2015-10-03 MED ORDER — METAXALONE 800 MG PO TABS: 800.0000 mg | ORAL_TABLET | Freq: Three times a day (TID) | ORAL | Status: DC

## 2015-10-04 ENCOUNTER — Other Ambulatory Visit: Payer: Self-pay | Admitting: Family Medicine

## 2015-10-26 ENCOUNTER — Ambulatory Visit (INDEPENDENT_AMBULATORY_CARE_PROVIDER_SITE_OTHER): Payer: BLUE CROSS/BLUE SHIELD | Admitting: Adult Health

## 2015-10-26 ENCOUNTER — Encounter: Payer: Self-pay | Admitting: Adult Health

## 2015-10-26 VITALS — BP 160/70 | Temp 98.1°F | Ht 67.0 in | Wt 222.0 lb

## 2015-10-26 DIAGNOSIS — I10 Essential (primary) hypertension: Secondary | ICD-10-CM

## 2015-10-26 DIAGNOSIS — I779 Disorder of arteries and arterioles, unspecified: Secondary | ICD-10-CM

## 2015-10-26 DIAGNOSIS — E669 Obesity, unspecified: Secondary | ICD-10-CM

## 2015-10-26 DIAGNOSIS — Z0000 Encounter for general adult medical examination without abnormal findings: Secondary | ICD-10-CM

## 2015-10-26 DIAGNOSIS — E785 Hyperlipidemia, unspecified: Secondary | ICD-10-CM

## 2015-10-26 DIAGNOSIS — I739 Peripheral vascular disease, unspecified: Secondary | ICD-10-CM

## 2015-10-26 LAB — LIPID PANEL
CHOL/HDL RATIO: 5
CHOLESTEROL: 223 mg/dL — AB (ref 0–200)
HDL: 44.6 mg/dL (ref >39.00)
LDL CALC: 163 mg/dL — AB (ref 0–99)
NonHDL: 178.06
Triglycerides: 74 mg/dL (ref 0.0–149.0)
VLDL: 14.8 mg/dL (ref 0.0–40.0)

## 2015-10-26 LAB — CBC WITH DIFFERENTIAL/PLATELET
Basophils Absolute: 0.1 10*3/uL (ref 0.0–0.1)
Basophils Relative: 1.7 % (ref 0.0–3.0)
EOS PCT: 10.3 % — AB (ref 0.0–5.0)
Eosinophils Absolute: 0.6 10*3/uL (ref 0.0–0.7)
HEMATOCRIT: 34.8 % — AB (ref 39.0–52.0)
HEMOGLOBIN: 11.6 g/dL — AB (ref 13.0–17.0)
LYMPHS PCT: 33.3 % (ref 12.0–46.0)
Lymphs Abs: 2 10*3/uL (ref 0.7–4.0)
MCHC: 33.4 g/dL (ref 30.0–36.0)
MCV: 91.3 fl (ref 78.0–100.0)
MONOS PCT: 9.2 % (ref 3.0–12.0)
Monocytes Absolute: 0.6 10*3/uL (ref 0.1–1.0)
Neutro Abs: 2.8 10*3/uL (ref 1.4–7.7)
Neutrophils Relative %: 45.5 % (ref 43.0–77.0)
Platelets: 213 10*3/uL (ref 150.0–400.0)
RBC: 3.81 Mil/uL — AB (ref 4.22–5.81)
RDW: 13.4 % (ref 11.5–15.5)
WBC: 6.1 10*3/uL (ref 4.0–10.5)

## 2015-10-26 LAB — POC URINALSYSI DIPSTICK (AUTOMATED)
Bilirubin, UA: NEGATIVE
GLUCOSE UA: NEGATIVE
Ketones, UA: NEGATIVE
LEUKOCYTES UA: NEGATIVE
NITRITE UA: NEGATIVE
PH UA: 6
Protein, UA: NEGATIVE
RBC UA: NEGATIVE
Spec Grav, UA: 1.01
UROBILINOGEN UA: 0.2

## 2015-10-26 LAB — HEPATIC FUNCTION PANEL
ALBUMIN: 4 g/dL (ref 3.5–5.2)
ALK PHOS: 46 U/L (ref 39–117)
ALT: 23 U/L (ref 0–53)
AST: 25 U/L (ref 0–37)
BILIRUBIN DIRECT: 0.1 mg/dL (ref 0.0–0.3)
Total Bilirubin: 0.6 mg/dL (ref 0.2–1.2)
Total Protein: 6.2 g/dL (ref 6.0–8.3)

## 2015-10-26 LAB — BASIC METABOLIC PANEL
BUN: 20 mg/dL (ref 6–23)
CO2: 30 mEq/L (ref 19–32)
Calcium: 9 mg/dL (ref 8.4–10.5)
Chloride: 105 mEq/L (ref 96–112)
Creatinine, Ser: 1.15 mg/dL (ref 0.40–1.50)
GFR: 66.75 mL/min (ref >60.00)
Glucose, Bld: 101 mg/dL — ABNORMAL HIGH (ref 70–99)
POTASSIUM: 4.6 meq/L (ref 3.5–5.1)
SODIUM: 139 meq/L (ref 135–145)

## 2015-10-26 LAB — PSA: PSA: 0.63 ng/mL (ref 0.10–4.00)

## 2015-10-26 LAB — HEMOGLOBIN A1C: HEMOGLOBIN A1C: 5.6 % (ref 4.6–6.5)

## 2015-10-26 NOTE — Progress Notes (Signed)
Patient presents to clinic today to establish care. He is a pleasant caucasian male who  has a past medical history of Allergic rhinitis; GERD (gastroesophageal reflux disease); Hyperlipidemia; Hypertension; Osteoarthritis; ED (erectile dysfunction); Carotid bruit; Renal insufficiency; Asthma; Obesity; CAD (coronary artery disease); Aortic stenosis; and Anemia.   His last physical was in 06/2014 with MD Sherren Mocha  Acute Concerns: Yearly Physical Exam  Chronic Issues: Hypertension  - Is managed by cardiology . He usually reports that that his blood pressures are usually around 140-150  Hyperlipidemia  - Well controlled   Obesity - He is walking and trying to eat healthy. He would like to get below 200 pounds.     Health Maintenance: Dental -- Twice a year  Vision -- Yearly  Immunizations -- UTD Colonoscopy -- He is due. Last was 06/2010 - 5 year plan - He has an appointment in June 2017  Diet: Does not follow a diet  Exercise: He walks and does a treadmill.   Is followed by:  Cardiology - Dr. Martinique - Once yearly  Nephrology -  - once a year     Past Medical History  Diagnosis Date  . Allergic rhinitis   . GERD (gastroesophageal reflux disease)   . Hyperlipidemia   . Hypertension   . Osteoarthritis   . ED (erectile dysfunction)   . Carotid bruit     r  . Renal insufficiency   . Asthma   . Obesity   . CAD (coronary artery disease)     a. s/p PTCA/stenting of the mid LAD and mid RCA 04/06/12  . Aortic stenosis     a. Mild by echo 03/24/12.  Marland Kitchen Anemia     Past Surgical History  Procedure Laterality Date  . Nm pet lymphoma      left side of neck 1999  . Percutaneous coronary stent intervention (pci-s) N/A 04/06/2012    Procedure: PERCUTANEOUS CORONARY STENT INTERVENTION (PCI-S);  Surgeon: Peter M Martinique, MD; Promus DES for 90% mid LAD and 95% mid RCA disease      Current Outpatient Prescriptions on File Prior to Visit  Medication Sig Dispense Refill  .  albuterol (PROVENTIL HFA;VENTOLIN HFA) 108 (90 BASE) MCG/ACT inhaler Inhale 2 puffs into the lungs every 6 (six) hours as needed. For shortness of breath 2 Inhaler 1  . amLODipine (NORVASC) 10 MG tablet TAKE 1 TABLET DAILY 100 tablet 2  . aspirin 81 MG chewable tablet Chew 81 mg by mouth daily.    . Cyanocobalamin (VITAMIN B 12 PO) Take 1 tablet by mouth daily.     Marland Kitchen esomeprazole (NEXIUM) 40 MG capsule TAKE 1 CAPSULE DAILY BEFORE BREAKFAST 100 capsule 2  . FeFum-FePoly-FA-B Cmp-C-Biot (INTEGRA PLUS) CAPS Take 1 tablet by mouth daily.    . halobetasol (ULTRAVATE) 0.05 % cream Apply topically 2 (two) times daily. (Patient taking differently: Apply topically as needed. ) 50 g 4  . hydrALAZINE (APRESOLINE) 25 MG tablet Take 1 tablet by mouth 3 (three) times daily. Take 1 tab by mouth tid    . lisinopril (PRINIVIL,ZESTRIL) 20 MG tablet TAKE 1 TABLET TWICE A DAY 200 tablet 2  . loratadine (CLARITIN) 10 MG tablet Take 10 mg by mouth daily.    . metaxalone (SKELAXIN) 800 MG tablet Take 1 tablet (800 mg total) by mouth 3 (three) times daily. No more than 2 days a week 30 tablet 2  . metoprolol succinate (TOPROL-XL) 50 MG 24 hr tablet TAKE 1 TABLET DAILY WITH OR  IMMEDIATELY FOLLOWING A MEAL 100 tablet 2  . mometasone (NASONEX) 50 MCG/ACT nasal spray Place 2 sprays into the nose as needed. 34 g 11  . montelukast (SINGULAIR) 10 MG tablet TAKE 1 TABLET (10 MG TOTAL) BY MOUTH AT BEDTIME. 30 tablet 3  . Multiple Vitamin (MULTIVITAMIN WITH MINERALS) TABS tablet Take 1 tablet by mouth daily.    . Multiple Vitamins-Minerals (VISION-VITE PRESERVE PO) Take 1 tablet by mouth daily.    . nitroGLYCERIN (NITROSTAT) 0.4 MG SL tablet Place 1 tablet (0.4 mg total) under the tongue every 5 (five) minutes as needed for chest pain (up to 3 doses. not within 24hrs of Viagra). 25 tablet 6  . pregabalin (LYRICA) 50 MG capsule Take 1 capsule (50 mg total) by mouth 3 (three) times daily. 84 capsule 0  . Saline (SIMPLY SALINE) 0.9 %  AERS Place 1 spray into the nose daily as needed (at bedtime).    . traMADol (ULTRAM) 50 MG tablet TAKE 1 TABLET EVERY 6 HOURS 120 tablet 1  . TURMERIC PO Take 500 mg by mouth daily.    Marland Kitchen ULORIC 80 MG TABS Take 1 tablet by mouth daily.    Marland Kitchen COLCRYS 0.6 MG tablet Take 0.6 mg by mouth daily as needed (for gout flareups). Reported on 10/26/2015     No current facility-administered medications on file prior to visit.    Allergies  Allergen Reactions  . Prednisone Shortness Of Breath and Swelling  . Crestor [Rosuvastatin Calcium] Other (See Comments)    myalgia    Family History  Problem Relation Age of Onset  . Hypertension    . Stroke    . Coronary artery disease Father     in his 71s  . Heart disease Father   . Heart attack Father     Social History   Social History  . Marital Status: Married    Spouse Name: N/A  . Number of Children: 1  . Years of Education: N/A   Occupational History  .     Social History Main Topics  . Smoking status: Former Research scientist (life sciences)  . Smokeless tobacco: Never Used  . Alcohol Use: No  . Drug Use: No  . Sexual Activity: Not Currently   Other Topics Concern  . Not on file   Social History Narrative   Retired   Married   Regular exercise- no    Review of Systems  Constitutional: Negative.   HENT: Negative.   Eyes: Negative.   Cardiovascular: Positive for leg swelling.  Gastrointestinal: Negative.   Genitourinary: Negative.   Musculoskeletal: Negative.   Skin: Negative.   Neurological: Negative.   Endo/Heme/Allergies: Negative.   Psychiatric/Behavioral: Negative.   All other systems reviewed and are negative.   There were no vitals taken for this visit.  Physical Exam  Constitutional: He is oriented to person, place, and time and well-developed, well-nourished, and in no distress. No distress.  HENT:  Head: Normocephalic and atraumatic.  Right Ear: External ear normal.  Left Ear: External ear normal.  Nose: Nose normal.    Mouth/Throat: Oropharynx is clear and moist.  Eyes: Conjunctivae and EOM are normal. Pupils are equal, round, and reactive to light. Right eye exhibits no discharge. Left eye exhibits no discharge. No scleral icterus.  Neck: Normal range of motion. Neck supple. No JVD present. No tracheal deviation present. No thyromegaly present.  Cardiovascular: Normal rate, regular rhythm and intact distal pulses.  Exam reveals no friction rub.   Murmur heard. Pulmonary/Chest: Effort  normal and breath sounds normal. No stridor. No respiratory distress. He has no wheezes. He has no rales. He exhibits no tenderness.  Abdominal: Soft. Bowel sounds are normal. He exhibits no distension and no mass. There is no tenderness. There is no rebound and no guarding.  Obese  Genitourinary: Rectum normal, prostate normal and penis normal. No discharge found.  Musculoskeletal: Normal range of motion. He exhibits edema (wears compression socks ). He exhibits no tenderness.  Neurological: He is alert and oriented to person, place, and time. He displays normal reflexes. No cranial nerve deficit. He exhibits normal muscle tone. Gait normal. Coordination normal. GCS score is 15.  Skin: Skin is warm and dry. No rash noted. He is not diaphoretic. No erythema. No pallor.  Psychiatric: Mood, memory, affect and judgment normal.  Nursing note and vitals reviewed.    Assessment/Plan:  1. Routine general medical examination at a health care facility - Follow up in one year or sooner if needed  - POCT Urinalysis Dipstick (Automated) - Basic metabolic panel - CBC with Differential/Platelet - Lipid panel - Hemoglobin A1c - Hepatic function panel - PSA  2. Essential hypertension - Not at goal  - Continue to follow up with cardiology - Work on losing weight - POCT Urinalysis Dipstick (Automated) - Basic metabolic panel - CBC with Differential/Platelet - Lipid panel - Hemoglobin A1c - Hepatic function panel - PSA - Had  EKG done 07/2015 which showed NSR, rate 71 3. Left-sided carotid artery disease (HCC) - POCT Urinalysis Dipstick (Automated) - Basic metabolic panel - CBC with Differential/Platelet - Lipid panel - Hemoglobin A1c - Hepatic function panel - PSA - Continue to follow up with cardiology  - Encouraged healthy eating and exercise 4. Hyperlipidemia - Not on statin. Appears controlled - POCT Urinalysis Dipstick (Automated) - Basic metabolic panel - CBC with Differential/Platelet - Lipid panel - Hemoglobin A1c - Hepatic function panel - PSA - Consider adding statin 5. Obesity - Increase exercise duration.  - Work on healthy eating - POCT Urinalysis Dipstick (Automated) - Basic metabolic panel - CBC with Differential/Platelet - Lipid panel - Hemoglobin A1c - Hepatic function panel - PSA  Dorothyann Peng, NP

## 2015-10-26 NOTE — Patient Instructions (Addendum)
It was great meeting you today!  I will follow up with you regarding your labs.   Follow up with me in one year or sooner if needed  Continue to exercise and work on a healthier diet.     Health Maintenance, Male A healthy lifestyle and preventative care can promote health and wellness.  Maintain regular health, dental, and eye exams.  Eat a healthy diet. Foods like vegetables, fruits, whole grains, low-fat dairy products, and lean protein foods contain the nutrients you need and are low in calories. Decrease your intake of foods high in solid fats, added sugars, and salt. Get information about a proper diet from your health care provider, if necessary.  Regular physical exercise is one of the most important things you can do for your health. Most adults should get at least 150 minutes of moderate-intensity exercise (any activity that increases your heart rate and causes you to sweat) each week. In addition, most adults need muscle-strengthening exercises on 2 or more days a week.   Maintain a healthy weight. The body mass index (BMI) is a screening tool to identify possible weight problems. It provides an estimate of body fat based on height and weight. Your health care provider can find your BMI and can help you achieve or maintain a healthy weight. For males 20 years and older:  A BMI below 18.5 is considered underweight.  A BMI of 18.5 to 24.9 is normal.  A BMI of 25 to 29.9 is considered overweight.  A BMI of 30 and above is considered obese.  Maintain normal blood lipids and cholesterol by exercising and minimizing your intake of saturated fat. Eat a balanced diet with plenty of fruits and vegetables. Blood tests for lipids and cholesterol should begin at age 65 and be repeated every 5 years. If your lipid or cholesterol levels are high, you are over age 44, or you are at high risk for heart disease, you may need your cholesterol levels checked more frequently.Ongoing high lipid  and cholesterol levels should be treated with medicines if diet and exercise are not working.  If you smoke, find out from your health care provider how to quit. If you do not use tobacco, do not start.  Lung cancer screening is recommended for adults aged 10-80 years who are at high risk for developing lung cancer because of a history of smoking. A yearly low-dose CT scan of the lungs is recommended for people who have at least a 30-pack-year history of smoking and are current smokers or have quit within the past 15 years. A pack year of smoking is smoking an average of 1 pack of cigarettes a day for 1 year (for example, a 30-pack-year history of smoking could mean smoking 1 pack a day for 30 years or 2 packs a day for 15 years). Yearly screening should continue until the smoker has stopped smoking for at least 15 years. Yearly screening should be stopped for people who develop a health problem that would prevent them from having lung cancer treatment.  If you choose to drink alcohol, do not have more than 2 drinks per day. One drink is considered to be 12 oz (360 mL) of beer, 5 oz (150 mL) of wine, or 1.5 oz (45 mL) of liquor.  Avoid the use of street drugs. Do not share needles with anyone. Ask for help if you need support or instructions about stopping the use of drugs.  High blood pressure causes heart disease and increases  the risk of stroke. High blood pressure is more likely to develop in:  People who have blood pressure in the end of the normal range (100-139/85-89 mm Hg).  People who are overweight or obese.  People who are African American.  If you are 44-16 years of age, have your blood pressure checked every 3-5 years. If you are 32 years of age or older, have your blood pressure checked every year. You should have your blood pressure measured twice--once when you are at a hospital or clinic, and once when you are not at a hospital or clinic. Record the average of the two measurements.  To check your blood pressure when you are not at a hospital or clinic, you can use:  An automated blood pressure machine at a pharmacy.  A home blood pressure monitor.  If you are 78-20 years old, ask your health care provider if you should take aspirin to prevent heart disease.  Diabetes screening involves taking a blood sample to check your fasting blood sugar level. This should be done once every 3 years after age 50 if you are at a normal weight and without risk factors for diabetes. Testing should be considered at a younger age or be carried out more frequently if you are overweight and have at least 1 risk factor for diabetes.  Colorectal cancer can be detected and often prevented. Most routine colorectal cancer screening begins at the age of 85 and continues through age 63. However, your health care provider may recommend screening at an earlier age if you have risk factors for colon cancer. On a yearly basis, your health care provider may provide home test kits to check for hidden blood in the stool. A small camera at the end of a tube may be used to directly examine the colon (sigmoidoscopy or colonoscopy) to detect the earliest forms of colorectal cancer. Talk to your health care provider about this at age 33 when routine screening begins. A direct exam of the colon should be repeated every 5-10 years through age 1, unless early forms of precancerous polyps or small growths are found.  People who are at an increased risk for hepatitis B should be screened for this virus. You are considered at high risk for hepatitis B if:  You were born in a country where hepatitis B occurs often. Talk with your health care provider about which countries are considered high risk.  Your parents were born in a high-risk country and you have not received a shot to protect against hepatitis B (hepatitis B vaccine).  You have HIV or AIDS.  You use needles to inject street drugs.  You live with, or have  sex with, someone who has hepatitis B.  You are a man who has sex with other men (MSM).  You get hemodialysis treatment.  You take certain medicines for conditions like cancer, organ transplantation, and autoimmune conditions.  Hepatitis C blood testing is recommended for all people born from [birth year] through 1965 and any individual with known risk factors for hepatitis C.  Healthy men should no longer receive prostate-specific antigen (PSA) blood tests as part of routine cancer screening. Talk to your health care provider about prostate cancer screening.  Testicular cancer screening is not recommended for adolescents or adult males who have no symptoms. Screening includes self-exam, a health care provider exam, and other screening tests. Consult with your health care provider about any symptoms you have or any concerns you have about testicular cancer.  Practice  safe sex. Use condoms and avoid high-risk sexual practices to reduce the spread of sexually transmitted infections (STIs).  You should be screened for STIs, including gonorrhea and chlamydia if:  You are sexually active and are younger than 24 years.  You are older than 24 years, and your health care provider tells you that you are at risk for this type of infection.  Your sexual activity has changed since you were last screened, and you are at an increased risk for chlamydia or gonorrhea. Ask your health care provider if you are at risk.  If you are at risk of being infected with HIV, it is recommended that you take a prescription medicine daily to prevent HIV infection. This is called pre-exposure prophylaxis (PrEP). You are considered at risk if:  You are a man who has sex with other men (MSM).  You are a heterosexual man who is sexually active with multiple partners.  You take drugs by injection.  You are sexually active with a partner who has HIV.  Talk with your health care provider about whether you are at high risk of  being infected with HIV. If you choose to begin PrEP, you should first be tested for HIV. You should then be tested every 3 months for as long as you are taking PrEP.  Use sunscreen. Apply sunscreen liberally and repeatedly throughout the day. You should seek shade when your shadow is shorter than you. Protect yourself by wearing long sleeves, pants, a wide-brimmed hat, and sunglasses year round whenever you are outdoors.  Tell your health care provider of new moles or changes in moles, especially if there is a change in shape or color. Also, tell your health care provider if a mole is larger than the size of a pencil eraser.  A one-time screening for abdominal aortic aneurysm (AAA) and surgical repair of large AAAs by ultrasound is recommended for men aged 52-75 years who are current or former smokers.  Stay current with your vaccines (immunizations).   This information is not intended to replace advice given to you by your health care provider. Make sure you discuss any questions you have with your health care provider.   Document Released: 11/16/2007 Document Revised: 06/10/2014 Document Reviewed: 10/15/2010 Elsevier Interactive Patient Education [employer].

## 2015-11-08 ENCOUNTER — Encounter: Payer: Self-pay | Admitting: Podiatry

## 2015-11-08 ENCOUNTER — Ambulatory Visit (INDEPENDENT_AMBULATORY_CARE_PROVIDER_SITE_OTHER): Payer: BLUE CROSS/BLUE SHIELD | Admitting: Podiatry

## 2015-11-08 DIAGNOSIS — Q828 Other specified congenital malformations of skin: Secondary | ICD-10-CM | POA: Diagnosis not present

## 2015-11-08 DIAGNOSIS — M79676 Pain in unspecified toe(s): Secondary | ICD-10-CM | POA: Diagnosis not present

## 2015-11-08 DIAGNOSIS — B351 Tinea unguium: Secondary | ICD-10-CM | POA: Diagnosis not present

## 2015-11-08 NOTE — Progress Notes (Signed)
Patient ID: Todd Spears, male   DOB: Jun 08, 1945, 70 y.o.   MRN: [id number] Complaint:  Visit Type: Patient returns to my office for continued preventative foot care services. Complaint: Patient states" my nails have grown long and thick and become painful to walk and wear shoes". The patient presents for preventative foot care services. No changes to ROS.  Painful callus under the outside ball of both feet.  Podiatric Exam: Vascular: dorsalis pedis and posterior tibial pulses are palpable bilateral. Capillary return is immediate. Temperature gradient is WNL. Skin turgor WNL  Sensorium: Normal Semmes Weinstein monofilament test. Normal tactile sensation bilaterally. Nail Exam: Pt has thick disfigured discolored nails with subungual debris noted bilateral entire nail hallux through fifth toenails Ulcer Exam: There is no evidence of ulcer or pre-ulcerative changes or infection. Orthopedic Exam: Muscle tone and strength are WNL. No limitations in general ROM. No crepitus or effusions noted. Foot type and digits show no abnormalities. Bony prominences are unremarkable. Skin:  Porokeratosis sub 5th metatarsal head both feet.. No infection or ulcers  Diagnosis:  Onychomycosis, , Pain in right toe, pain in left toes. Porokeratosis B/L  Treatment & Plan Procedures and Treatment: Consent by patient was obtained for treatment procedures. The patient understood the discussion of treatment and procedures well. All questions were answered thoroughly reviewed. Debridement of mycotic and hypertrophic toenails, 1 through 5 bilateral and clearing of subungual debris. No ulceration, no infection noted. Debride porokeratosis Return Visit-Office Procedure: Patient instructed to return to the office for a follow up visit 3 months for continued evaluation and treatment.  Gardiner Barefoot DPM

## 2015-12-01 ENCOUNTER — Ambulatory Visit: Payer: BLUE CROSS/BLUE SHIELD | Admitting: Internal Medicine

## 2015-12-14 ENCOUNTER — Ambulatory Visit: Payer: BLUE CROSS/BLUE SHIELD | Admitting: Adult Health

## 2015-12-15 ENCOUNTER — Ambulatory Visit (INDEPENDENT_AMBULATORY_CARE_PROVIDER_SITE_OTHER): Payer: BLUE CROSS/BLUE SHIELD | Admitting: Adult Health

## 2015-12-15 ENCOUNTER — Encounter: Payer: Self-pay | Admitting: Adult Health

## 2015-12-15 VITALS — BP 150/64 | Temp 98.1°F | Ht 67.0 in | Wt 224.6 lb

## 2015-12-15 DIAGNOSIS — K4090 Unilateral inguinal hernia, without obstruction or gangrene, not specified as recurrent: Secondary | ICD-10-CM | POA: Diagnosis not present

## 2015-12-15 NOTE — Progress Notes (Signed)
Subjective:    Patient ID: Todd Spears, male    DOB: 04-10-1946, 70 y.o.   MRN: [id number]  HPI  Todd Spears is a 70 year old male patient of mine who presents today for subacute issue of right groin pain. Back in November 2015 his then primary care provider Dr. Christie Nottingham had worked up the patient for the same issue. An ultrasound was done which was negative. In May 2016, Todd Spears was sent to Dr. Roselle Locus. surgery, at which time a CT of the pelvis was done. CAT scan showed:  1. There are fat containing bilateral inguinal hernias versus lipomatosis hypertrophy of the spermatic cord given the bilateral symmetric appearance. 2. No inguinal lymphadenopathy.  Todd Spears reports since that time he continues to have "burning" type of pain that is intermittent. He can go days without having any pain at all but then all of a sudden the pain presents itself. He also reports a hard "lump" in his right groin from time to time. This "lump", is not present all the time and often goes away for a period of time. He is finding it more difficult to deal with the pain when it is present. Currently he was raised between tramadol Motrin and Tylenol.  Review of Systems  Genitourinary: Negative for frequency, hematuria, flank pain and difficulty urinating.       Pain in right groin. Lump in right groin  All other systems reviewed and are negative.  Past Medical History  Diagnosis Date  . Allergic rhinitis   . GERD (gastroesophageal reflux disease)   . Hyperlipidemia   . Hypertension   . Osteoarthritis   . ED (erectile dysfunction)   . Carotid bruit     r  . Renal insufficiency   . Asthma   . Obesity   . CAD (coronary artery disease)     a. s/p PTCA/stenting of the mid LAD and mid RCA 04/06/12  . Aortic stenosis     a. Mild by echo 03/24/12.  Marland Kitchen Anemia   . Restless leg syndrome   . Gout   . Diverticulosis   . Colon polyp   . Fatty liver     Social History   Social History  . Marital Status:  Married    Spouse Name: N/A  . Number of Children: 1  . Years of Education: N/A   Occupational History  .     Social History Main Topics  . Smoking status: Former Smoker    Quit date: 06/04/1975  . Smokeless tobacco: Never Used  . Alcohol Use: No  . Drug Use: No  . Sexual Activity: Not Currently   Other Topics Concern  . Not on file   Social History Narrative   Works as a Engineer, building services    Married   One son ( lives in [place])    Regular exercise- no    Past Surgical History  Procedure Laterality Date  . Nm pet lymphoma      left side of neck 1999  . Percutaneous coronary stent intervention (pci-s) N/A 04/06/2012    Procedure: PERCUTANEOUS CORONARY STENT INTERVENTION (PCI-S);  Surgeon: Peter M Martinique, MD; Promus DES for 90% mid LAD and 95% mid RCA disease      Family History  Problem Relation Age of Onset  . Hypertension Mother   . Stroke Mother     Several   . Coronary artery disease Father     in his 82s  . Heart disease Father   .  Heart attack Father   . Stomach cancer Mother   . Hypertension Mother   . Hypertension Paternal Grandfather   . Hypertension Paternal Grandmother   . Glaucoma Mother     Allergies  Allergen Reactions  . Prednisone Shortness Of Breath and Swelling  . Crestor [Rosuvastatin Calcium] Other (See Comments)    myalgia  . Lyrica [Pregabalin]     "swelling"    Current Outpatient Prescriptions on File Prior to Visit  Medication Sig Dispense Refill  . albuterol (PROVENTIL HFA;VENTOLIN HFA) 108 (90 BASE) MCG/ACT inhaler Inhale 2 puffs into the lungs every 6 (six) hours as needed. For shortness of breath 2 Inhaler 1  . amLODipine (NORVASC) 10 MG tablet TAKE 1 TABLET DAILY 100 tablet 2  . aspirin 81 MG chewable tablet Chew 81 mg by mouth daily.    Marland Kitchen COLCRYS 0.6 MG tablet Take 0.6 mg by mouth daily as needed (for gout flareups). Reported on 10/26/2015    . Cyanocobalamin (VITAMIN B 12 PO) Take 1 tablet by mouth daily.     Marland Kitchen esomeprazole  (NEXIUM) 40 MG capsule TAKE 1 CAPSULE DAILY BEFORE BREAKFAST 100 capsule 2  . FeFum-FePoly-FA-B Cmp-C-Biot (INTEGRA PLUS) CAPS Take 1 tablet by mouth daily.    . halobetasol (ULTRAVATE) 0.05 % cream Apply topically 2 (two) times daily. (Patient taking differently: Apply topically as needed. ) 50 g 4  . hydrALAZINE (APRESOLINE) 25 MG tablet Take 50 mg by mouth 3 (three) times daily. Take 50mg  tab by mouth tid    . lisinopril (PRINIVIL,ZESTRIL) 20 MG tablet TAKE 1 TABLET TWICE A DAY 200 tablet 2  . loratadine (CLARITIN) 10 MG tablet Take 10 mg by mouth daily.    . metaxalone (SKELAXIN) 800 MG tablet Take 1 tablet (800 mg total) by mouth 3 (three) times daily. No more than 2 days a week 30 tablet 2  . metoprolol succinate (TOPROL-XL) 50 MG 24 hr tablet TAKE 1 TABLET DAILY WITH OR IMMEDIATELY FOLLOWING A MEAL 100 tablet 2  . mometasone (NASONEX) 50 MCG/ACT nasal spray Place 2 sprays into the nose as needed. 34 g 11  . montelukast (SINGULAIR) 10 MG tablet TAKE 1 TABLET (10 MG TOTAL) BY MOUTH AT BEDTIME. 30 tablet 3  . Multiple Vitamin (MULTIVITAMIN WITH MINERALS) TABS tablet Take 1 tablet by mouth daily.    . Multiple Vitamins-Minerals (VISION-VITE PRESERVE PO) Take 1 tablet by mouth daily.    . nitroGLYCERIN (NITROSTAT) 0.4 MG SL tablet Place 1 tablet (0.4 mg total) under the tongue every 5 (five) minutes as needed for chest pain (up to 3 doses. not within 24hrs of Viagra). 25 tablet 6  . Saline (SIMPLY SALINE) 0.9 % AERS Place 1 spray into the nose daily as needed (at bedtime).    . traMADol (ULTRAM) 50 MG tablet TAKE 1 TABLET EVERY 6 HOURS 120 tablet 1  . TURMERIC PO Take 500 mg by mouth daily.    Marland Kitchen ULORIC 80 MG TABS Take 1 tablet by mouth daily. Reported on 10/26/2015     No current facility-administered medications on file prior to visit.    BP 150/64 mmHg  Temp(Src) 98.1 F (36.7 C) (Oral)  Ht 5\' 7"  (1.702 m)  Wt 224 lb 9.6 oz (101.878 kg)  BMI 35.17 kg/m2       Objective:   Physical  Exam  Constitutional: He is oriented to person, place, and time. He appears well-developed and well-nourished. No distress.  Cardiovascular: Normal rate, regular rhythm, normal heart sounds and  intact distal pulses.  Exam reveals no gallop and no friction rub.   No murmur heard. Pulmonary/Chest: Effort normal and breath sounds normal. No respiratory distress. He has no wheezes. He has no rales. He exhibits no tenderness.  Abdominal: A hernia is present. Hernia confirmed positive in the right inguinal area.  Genitourinary: Testes normal.     Patient does have what appears to be a right inguinal hernia. As a mass in the right groin. Masses about the size of a golf ball and is hard but easily reducible. tender with palpation  Neurological: He is alert and oriented to person, place, and time.  Skin: Skin is warm and dry. No rash noted. He is not diaphoretic. No erythema. No pallor.  Psychiatric: He has a normal mood and affect. His behavior is normal. Judgment and thought content normal.  Nursing note and vitals reviewed.     Assessment & Plan:  1. Unilateral inguinal hernia without obstruction or gangrene, recurrence not specified - Due to patient's symptoms and general noticeable mass in right groin I feel that it's worthwhile to refer patient back to Gen. surgery for reevaluation - Ambulatory referral to General Surgery - Continue with current pain management plan - Advised to go to the emergency room if he has any severe abdominal pain feels as though the hernia could not be reduced or changes in skin color  Dorothyann Peng, NP

## 2015-12-26 ENCOUNTER — Telehealth: Payer: Self-pay | Admitting: Adult Health

## 2015-12-26 NOTE — Telephone Encounter (Signed)
Pt would like to speak with Jerene Pitch and would not elaborate the purpose of the call.

## 2015-12-26 NOTE — Telephone Encounter (Signed)
I contacted patient. He states that he was in to see [facility] for a hernia, & he referred him to Dr. Donne Hazel who unfortunately cannot see him until 01/12/16. Patient wants to know if an MRI can be done before his visit with Dr. Donne Hazel? - because pain is really bad. Or would [facility] rather wait and have Dr. Donne Hazel order the MRI if he thinks it is necessary.

## 2015-12-27 NOTE — Telephone Encounter (Signed)
I would wait until he is seen by Dr. Donne Hazel for the MRI. If the pain is that bad then I would advice to go to urgent care or the ER

## 2015-12-27 NOTE — Telephone Encounter (Signed)
Patient notified of Cory's comments. Patient verbalized understanding.  

## 2016-01-24 ENCOUNTER — Telehealth: Payer: Self-pay | Admitting: Cardiology

## 2016-01-24 NOTE — Telephone Encounter (Signed)
New Message  Request for surgical clearance:  1. What type of surgery is being performed? hernia  2. When is this surgery scheduled? Nothing as of yet, waiting on MD Martinique for approval  3. Are there any medications that need to be held prior to surgery and how long? N/A  4. Name of physician performing surgery? MD Quin Hoop  5. What is your office phone and fax number? [phone]

## 2016-01-24 NOTE — Telephone Encounter (Signed)
Will forward for dr jordan's review and advise. Pt has a f/u 6 months appointment in November.

## 2016-01-24 NOTE — Telephone Encounter (Signed)
He is cleared for hernia surgery from a cardiac standpoint.  Peter Martinique MD, [facility]

## 2016-01-24 NOTE — Telephone Encounter (Signed)
Spoke to patient.Dr.Jordan advised ok to have upcoming hernia surgery.I will let Dr.Wakefield's office know tomorrow 01/25/16.

## 2016-01-25 NOTE — Telephone Encounter (Signed)
Note faxed to Dr.Wakefield's office at fax # [phone].

## 2016-01-31 ENCOUNTER — Ambulatory Visit (INDEPENDENT_AMBULATORY_CARE_PROVIDER_SITE_OTHER): Payer: BLUE CROSS/BLUE SHIELD | Admitting: Podiatry

## 2016-01-31 ENCOUNTER — Encounter: Payer: Self-pay | Admitting: Podiatry

## 2016-01-31 DIAGNOSIS — Q828 Other specified congenital malformations of skin: Secondary | ICD-10-CM | POA: Diagnosis not present

## 2016-01-31 DIAGNOSIS — M79676 Pain in unspecified toe(s): Secondary | ICD-10-CM

## 2016-01-31 DIAGNOSIS — B351 Tinea unguium: Secondary | ICD-10-CM

## 2016-01-31 NOTE — Progress Notes (Signed)
Patient ID: Todd Spears, male   DOB: 03-11-1946, 70 y.o.   MRN: [id number] Complaint:  Visit Type: Patient returns to my office for continued preventative foot care services. Complaint: Patient states" my nails have grown long and thick and become painful to walk and wear shoes". The patient presents for preventative foot care services. No changes to ROS.  Painful callus under the outside ball of both feet.  Podiatric Exam: Vascular: dorsalis pedis and posterior tibial pulses are palpable bilateral. Capillary return is immediate. Temperature gradient is WNL. Skin turgor WNL  Sensorium: Normal Semmes Weinstein monofilament test. Normal tactile sensation bilaterally. Nail Exam: Pt has thick disfigured discolored nails with subungual debris noted bilateral entire nail hallux through fifth toenails Ulcer Exam: There is no evidence of ulcer or pre-ulcerative changes or infection. Orthopedic Exam: Muscle tone and strength are WNL. No limitations in general ROM. No crepitus or effusions noted. Foot type and digits show no abnormalities. Bony prominences are unremarkable. Skin:  Porokeratosis sub 5th metatarsal head both feet.. No infection or ulcers  Diagnosis:  Onychomycosis, , Pain in right toe, pain in left toes. Porokeratosis B/L  Treatment & Plan Procedures and Treatment: Consent by patient was obtained for treatment procedures. The patient understood the discussion of treatment and procedures well. All questions were answered thoroughly reviewed. Debridement of mycotic and hypertrophic toenails, 1 through 5 bilateral and clearing of subungual debris. No ulceration, no infection noted. Debride porokeratosis Return Visit-Office Procedure: Patient instructed to return to the office for a follow up visit 3 months for continued evaluation and treatment.  Gardiner Barefoot DPM

## 2016-02-05 ENCOUNTER — Other Ambulatory Visit: Payer: Self-pay | Admitting: Family Medicine

## 2016-02-05 ENCOUNTER — Emergency Department (HOSPITAL_COMMUNITY)
Admission: EM | Admit: 2016-02-05 | Discharge: 2016-02-06 | Disposition: A | Discharge status: Home / Self Care | Payer: BLUE CROSS/BLUE SHIELD | Attending: Emergency Medicine | Admitting: Emergency Medicine

## 2016-02-05 ENCOUNTER — Encounter (HOSPITAL_COMMUNITY): Payer: Self-pay | Admitting: Emergency Medicine

## 2016-02-05 DIAGNOSIS — I2510 Atherosclerotic heart disease of native coronary artery without angina pectoris: Secondary | ICD-10-CM | POA: Diagnosis not present

## 2016-02-05 DIAGNOSIS — Z87891 Personal history of nicotine dependence: Secondary | ICD-10-CM | POA: Insufficient documentation

## 2016-02-05 DIAGNOSIS — Z79899 Other long term (current) drug therapy: Secondary | ICD-10-CM | POA: Diagnosis not present

## 2016-02-05 DIAGNOSIS — I10 Essential (primary) hypertension: Secondary | ICD-10-CM | POA: Diagnosis not present

## 2016-02-05 DIAGNOSIS — J45909 Unspecified asthma, uncomplicated: Secondary | ICD-10-CM | POA: Insufficient documentation

## 2016-02-05 DIAGNOSIS — R0602 Shortness of breath: Secondary | ICD-10-CM | POA: Diagnosis not present

## 2016-02-05 DIAGNOSIS — Z7982 Long term (current) use of aspirin: Secondary | ICD-10-CM | POA: Insufficient documentation

## 2016-02-05 DIAGNOSIS — R002 Palpitations: Secondary | ICD-10-CM | POA: Insufficient documentation

## 2016-02-05 NOTE — ED Triage Notes (Signed)
Per [facility]: Patient to ED from home c/o "heart racing" and tremors of arms and legs starting at 2100. States this happened before in January and found out he had low NA+. Denies CP/SOB/N/V. Patient states the palpitations have subsided and the tremors are not as intense. Patient A&O x 4, ambulatory with steady gait. EMS VS: 172/80, HR 90 sinus rhythm, 98% RA, CBG 120. Dependent edema in BLE.

## 2016-02-05 NOTE — ED Provider Notes (Signed)
[facility] DEPT Provider Note   CSN: [id number] Arrival date & time: 02/05/16  2326  By signing my name below, I, Reola Mosher, attest that this documentation has been prepared under the direction and in the presence of Everlene Balls, MD. Electronically Signed: Reola Mosher, ED Scribe. 02/05/16. 11:35 PM.  History   Chief Complaint Chief Complaint  Patient presents with  . Palpitations  . Tremors   The history is provided by the patient. No language interpreter was used.    HPI Comments: Todd Spears is a 70 y.o. male BIB EMS with a PMHx of CAD s/p stent placement, HTN and HLD, who presents to the Emergency Department complaining of sudden onset, gradually improving episode of sensation and palpitations onset ~3 hours ago. He reports associated tremors in his upper and lower extremities secondary to his palpitations. He was sitting during the onset of this symptoms. Pt states that when his symptoms first began he checked his BP and noted it at 185/80. He notes that prior to the onset of his episode his BP was 159/79. Pt reports that he took 7 81mg  ASA since the onset of his symptoms with mild relief. Pt notes that he has had a similar episode of same ~8 months ago, and his workup at that time revealed that his sodium levels were low. He was admitted at that time. Pt wears compression stockings at baseline, and his wife notes at bedside that his ankles are more swollen than normal. Pt does not take a water pill daily. Denies CP, SOB, nausea, vomiting, diarrhea, or any other associated symptoms.   Past Medical History:  Diagnosis Date  . Allergic rhinitis   . Anemia   . Aortic stenosis    a. Mild by echo 03/24/12.  . Asthma   . CAD (coronary artery disease)    a. s/p PTCA/stenting of the mid LAD and mid RCA 04/06/12  . Carotid bruit    r  . Colon polyp   . Diverticulosis   . ED (erectile dysfunction)   . Fatty liver   . GERD (gastroesophageal reflux disease)   .  Gout   . Hyperlipidemia   . Hypertension   . Obesity   . Osteoarthritis   . Renal insufficiency   . Restless leg syndrome    Patient Active Problem List   Diagnosis Date Noted  . Obesity 10/26/2015  . Carotid arterial disease ([place]) 08/10/2015  . Aortic stenosis, mild 08/10/2015  . Hyponatremia 07/03/2015  . Acute-on-chronic kidney injury ([place]) 07/03/2015  . Leukocytosis 07/03/2015  . Seborrheic keratosis 03/14/2015  . Right groin pain 04/11/2014  . Anemia of unknown etiology 09/15/2012  . CAD (coronary artery disease)   . Leg pain, bilateral 01/30/2012  . Chest pain, atypical 12/10/2011  . Heart murmur previously undiagnosed 03/25/2011  . Anemia in chronic renal disease 12/18/2009  . RESTLESS LEG SYNDROME 03/23/2009  . ERECTILE DYSFUNCTION, ORGANIC 03/23/2009  . CAROTID BRUIT, RIGHT 11/08/2008  . CERUMEN IMPACTION, BILATERAL 07/07/2007  . EDEMA 04/06/2007  . Hyperlipidemia 11/27/2006  . Essential hypertension 11/27/2006  . Allergic rhinitis 11/27/2006  . GERD 11/27/2006  . OSTEOARTHRITIS 11/27/2006   Past Surgical History:  Procedure Laterality Date  . NM PET LYMPHOMA     left side of neck 1999  . PERCUTANEOUS CORONARY STENT INTERVENTION (PCI-S) N/A 04/06/2012   Procedure: PERCUTANEOUS CORONARY STENT INTERVENTION (PCI-S);  Surgeon: Peter M Martinique, MD; Promus DES for 90% mid LAD and 95% mid RCA disease  Home Medications    Prior to Admission medications   Medication Sig Start Date End Date Taking? Authorizing Provider  albuterol (PROVENTIL HFA;VENTOLIN HFA) 108 (90 BASE) MCG/ACT inhaler Inhale 2 puffs into the lungs every 6 (six) hours as needed. For shortness of breath 09/15/12  Yes Dorena Cookey, MD  amLODipine (NORVASC) 10 MG tablet TAKE 1 TABLET DAILY 07/28/15  Yes Dorena Cookey, MD  aspirin 81 MG chewable tablet Chew 81 mg by mouth daily.   Yes Historical Provider, MD  cetirizine (ZYRTEC) 10 MG tablet Take 10 mg by mouth every morning.   Yes Historical  Provider, MD  COLCRYS 0.6 MG tablet Take 0.6 mg by mouth daily as needed (for gout flareups). Reported on 10/26/2015 04/09/12  Yes Historical Provider, MD  Cyanocobalamin (VITAMIN B 12 PO) Take 1 tablet by mouth daily.    Yes Historical Provider, MD  esomeprazole (NEXIUM) 40 MG capsule TAKE 1 CAPSULE DAILY BEFORE BREAKFAST 06/16/15  Yes Dorena Cookey, MD  FeFum-FePoly-FA-B Cmp-C-Biot (INTEGRA PLUS) CAPS Take 1 tablet by mouth daily. 07/04/14  Yes Historical Provider, MD  halobetasol (ULTRAVATE) 0.05 % cream Apply topically 2 (two) times daily. Patient taking differently: Apply 1 application topically daily as needed (deratosis).  12/17/10  Yes Dorena Cookey, MD  hydrALAZINE (APRESOLINE) 25 MG tablet Take 50 mg by mouth 3 (three) times daily.  06/02/15  Yes Historical Provider, MD  lisinopril (PRINIVIL,ZESTRIL) 20 MG tablet TAKE 1 TABLET TWICE A DAY 06/16/15  Yes Dorena Cookey, MD  loratadine (CLARITIN) 10 MG tablet Take 10 mg by mouth daily.   Yes Historical Provider, MD  metoprolol succinate (TOPROL-XL) 50 MG 24 hr tablet TAKE 1 TABLET DAILY WITH OR IMMEDIATELY FOLLOWING A MEAL 08/21/15  Yes Dorena Cookey, MD  montelukast (SINGULAIR) 10 MG tablet TAKE 1 TABLET (10 MG TOTAL) BY MOUTH AT BEDTIME. 10/05/15  Yes Dorena Cookey, MD  Multiple Vitamin (MULTIVITAMIN WITH MINERALS) TABS tablet Take 1 tablet by mouth daily.   Yes Historical Provider, MD  Multiple Vitamins-Minerals (VISION-VITE PRESERVE PO) Take 1 tablet by mouth 2 (two) times daily.    Yes Historical Provider, MD  nitroGLYCERIN (NITROSTAT) 0.4 MG SL tablet Place 1 tablet (0.4 mg total) under the tongue every 5 (five) minutes as needed for chest pain (up to 3 doses. not within 24hrs of Viagra). 11/21/14  Yes Peter M Martinique, MD  Saline (SIMPLY SALINE) 0.9 % AERS Place 1 spray into the nose daily as needed (congestion).    Yes Historical Provider, MD  traMADol (ULTRAM) 50 MG tablet TAKE 1 TABLET EVERY 6 HOURS Patient taking differently: TAKE 1 TABLET  EVERY 6 HOURS AS NEEDED FOR PAIN 09/28/15  Yes Dorena Cookey, MD  ULORIC 80 MG TABS Take 1 tablet by mouth daily. Reported on 10/26/2015 07/25/14  Yes Historical Provider, MD   Family History Family History  Problem Relation Age of Onset  . Coronary artery disease Father     in his 62s  . Heart disease Father   . Heart attack Father   . Hypertension Mother   . Stroke Mother     Several   . Stomach cancer Mother   . Glaucoma Mother   . Hypertension Paternal Grandmother   . Hypertension Paternal Grandfather    Social History Social History  Substance Use Topics  . Smoking status: Former Smoker    Quit date: 06/04/1975  . Smokeless tobacco: Never Used  . Alcohol use No   Allergies  Prednisone; Crestor [rosuvastatin calcium]; and Lyrica [pregabalin]  Review of Systems Review of Systems A complete 10 system review of systems was obtained and all systems are negative except as noted in the HPI and PMH.   Physical Exam Updated Vital Signs BP (!) 137/53   Pulse (!) 56   Temp 98.8 F (37.1 C) (Oral)   Resp 12   Ht 5\' 7"  (1.702 m)   Wt 224 lb (101.6 kg)   SpO2 100%   BMI 35.08 kg/m   Physical Exam  Constitutional: He is oriented to person, place, and time. Vital signs are normal. He appears well-developed and well-nourished.  Non-toxic appearance. He does not appear ill. No distress.  HENT:  Head: Normocephalic and atraumatic.  Nose: Nose normal.  Mouth/Throat: Oropharynx is clear and moist. No oropharyngeal exudate.  Eyes: Conjunctivae and EOM are normal. Pupils are equal, round, and reactive to light. No scleral icterus.  Neck: Normal range of motion. Neck supple. No tracheal deviation, no edema, no erythema and normal range of motion present. No thyroid mass and no thyromegaly present.  Cardiovascular: Normal rate, regular rhythm, S1 normal, S2 normal, normal heart sounds, intact distal pulses and normal pulses.  Exam reveals no gallop and no friction rub.   No murmur  heard. Pulmonary/Chest: Effort normal and breath sounds normal. No respiratory distress. He has no wheezes. He has no rhonchi. He has no rales.  Abdominal: Soft. Normal appearance and bowel sounds are normal. He exhibits no distension, no ascites and no mass. There is no hepatosplenomegaly. There is no tenderness. There is no rebound, no guarding and no CVA tenderness.  Musculoskeletal: Normal range of motion. He exhibits edema. He exhibits no tenderness.  BLE edema noted.   Lymphadenopathy:    He has no cervical adenopathy.  Neurological: He is alert and oriented to person, place, and time. He has normal strength. No cranial nerve deficit or sensory deficit.  Skin: Skin is warm, dry and intact. No petechiae and no rash noted. He is not diaphoretic. No erythema. No pallor.  Nursing note and vitals reviewed.  ED Treatments / Results  DIAGNOSTIC STUDIES: Oxygen Saturation is 99% on RA, normal by my interpretation.   COORDINATION OF CARE: 11:34 PM-Discussed next steps with pt. Pt verbalized understanding and is agreeable with the plan.   Labs (all labs ordered are listed, but only abnormal results are displayed) Labs Reviewed  CBC WITH DIFFERENTIAL/PLATELET - Abnormal; Notable for the following:       Result Value   RBC 3.83 (*)    Hemoglobin 11.5 (*)    HCT 34.4 (*)    All other components within normal limits  BASIC METABOLIC PANEL - Abnormal; Notable for the following:    Sodium 134 (*)    Glucose, Bld 115 (*)    All other components within normal limits  BRAIN NATRIURETIC PEPTIDE - Abnormal; Notable for the following:    B Natriuretic Peptide 101.7 (*)    All other components within normal limits  I-STAT CHEM 8, ED - Abnormal; Notable for the following:    Chloride 100 (*)    BUN 22 (*)    Glucose, Bld 114 (*)    Calcium, Ion 1.14 (*)    Hemoglobin 12.6 (*)    HCT 37.0 (*)    All other components within normal limits  CK  MAGNESIUM  URINE RAPID DRUG SCREEN, HOSP  PERFORMED  ETHANOL  I-STAT TROPOININ, ED  I-STAT TROPOININ, ED    EKG  EKG Interpretation  Date/Time:  Tuesday February 06 2016 02:56:24 EDT Ventricular Rate:  63 PR Interval:    QRS Duration: 107 QT Interval:  411 QTC Calculation: 421 R Axis:   19 Text Interpretation:  Sinus rhythm No significant change since last tracing Confirmed by Glynn Octave [phone]) on 02/06/2016 3:15:35 AM      Radiology Dg Chest 2 View  Result Date: 02/06/2016 CLINICAL DATA:  Acute onset of palpitations and tremors. Initial encounter. EXAM: CHEST  2 VIEW COMPARISON:  Chest radiograph performed 04/03/2012 FINDINGS: The lungs are well-aerated. Minimal left basilar atelectasis is noted. There is no evidence of pleural effusion or pneumothorax. The heart is normal in size; the mediastinal contour is within normal limits. No acute osseous abnormalities are seen. IMPRESSION: Minimal left basilar atelectasis noted.  Lungs otherwise clear. Electronically Signed   By: Garald Balding M.D.   On: 02/06/2016 00:18    Procedures Procedures (including critical care time)  Medications Ordered in ED Medications - No data to display  Initial Impression / Assessment and Plan / ED Course  I have reviewed the triage vital signs and the nursing notes.  Pertinent labs & imaging results that were available during my care of the patient were reviewed by me and considered in my medical decision making (see chart for details).  Clinical Course   Patient presents to the ED for palpitations and shaking.  His tor not consistent with ACS.  States last time he had hyponatremia.  Will order labs and perform ED chest pain work up with 2 sets of troponins and EKGs.  He currently states his symptoms are resolving and he is already feeling much better without any interventions.  3:20 AM Initial and repeat troponins are negative.  EKGs do not show ischemia and are unchanged.  Patient continues to feel well and appears in NAD.  Vs  remain within his normal limits and he is safe for Dc.  Advised to fu with Dr. Martinique in the morning for further care.  He demonstrates good understanding.  Final Clinical Impressions(s) / ED Diagnoses   Final diagnoses:  None   New Prescriptions New Prescriptions   No medications on file      I personally performed the services described in this documentation, which was scribed in my presence. The recorded information has been reviewed and is accurate.      Everlene Balls, MD 02/06/16 [phone]

## 2016-02-06 ENCOUNTER — Emergency Department (HOSPITAL_COMMUNITY): Payer: BLUE CROSS/BLUE SHIELD

## 2016-02-06 ENCOUNTER — Telehealth: Payer: Self-pay | Admitting: Cardiology

## 2016-02-06 LAB — CBC WITH DIFFERENTIAL/PLATELET
BASOS ABS: 0.1 10*3/uL (ref 0.0–0.1)
BASOS PCT: 1 %
Eosinophils Absolute: 0.7 10*3/uL (ref 0.0–0.7)
Eosinophils Relative: 7 %
HEMATOCRIT: 34.4 % — AB (ref 39.0–52.0)
HEMOGLOBIN: 11.5 g/dL — AB (ref 13.0–17.0)
LYMPHS PCT: 31 %
Lymphs Abs: 3.1 10*3/uL (ref 0.7–4.0)
MCH: 30 pg (ref 26.0–34.0)
MCHC: 33.4 g/dL (ref 30.0–36.0)
MCV: 89.8 fL (ref 78.0–100.0)
MONO ABS: 0.9 10*3/uL (ref 0.1–1.0)
MONOS PCT: 9 %
NEUTROS ABS: 5.3 10*3/uL (ref 1.7–7.7)
NEUTROS PCT: 52 %
Platelets: 230 10*3/uL (ref 150–400)
RBC: 3.83 MIL/uL — ABNORMAL LOW (ref 4.22–5.81)
RDW: 12.8 % (ref 11.5–15.5)
WBC: 10 10*3/uL (ref 4.0–10.5)

## 2016-02-06 LAB — BASIC METABOLIC PANEL
ANION GAP: 7 (ref 5–15)
BUN: 18 mg/dL (ref 6–20)
CALCIUM: 9.4 mg/dL (ref 8.9–10.3)
CHLORIDE: 102 mmol/L (ref 101–111)
CO2: 25 mmol/L (ref 22–32)
Creatinine, Ser: 1.17 mg/dL (ref 0.61–1.24)
GFR calc Af Amer: >60 mL/min (ref >60)
GFR calc non Af Amer: >60 mL/min (ref >60)
GLUCOSE: 115 mg/dL — AB (ref 65–99)
Potassium: 4.3 mmol/L (ref 3.5–5.1)
Sodium: 134 mmol/L — ABNORMAL LOW (ref 135–145)

## 2016-02-06 LAB — I-STAT CHEM 8, ED
BUN: 22 mg/dL — AB (ref 6–20)
CALCIUM ION: 1.14 mmol/L — AB (ref 1.15–1.40)
CHLORIDE: 100 mmol/L — AB (ref 101–111)
Creatinine, Ser: 1.2 mg/dL (ref 0.61–1.24)
GLUCOSE: 114 mg/dL — AB (ref 65–99)
HCT: 37 % — ABNORMAL LOW (ref 39.0–52.0)
Hemoglobin: 12.6 g/dL — ABNORMAL LOW (ref 13.0–17.0)
POTASSIUM: 4.1 mmol/L (ref 3.5–5.1)
Sodium: 135 mmol/L (ref 135–145)
TCO2: 27 mmol/L (ref 0–100)

## 2016-02-06 LAB — CK: Total CK: 122 U/L (ref 49–397)

## 2016-02-06 LAB — I-STAT TROPONIN, ED
Troponin i, poc: 0 ng/mL (ref 0.00–0.08)
Troponin i, poc: 0.02 ng/mL (ref 0.00–0.08)

## 2016-02-06 LAB — RAPID URINE DRUG SCREEN, HOSP PERFORMED
AMPHETAMINES: NOT DETECTED
BENZODIAZEPINES: NOT DETECTED
Barbiturates: NOT DETECTED
Cocaine: NOT DETECTED
OPIATES: NOT DETECTED
TETRAHYDROCANNABINOL: NOT DETECTED

## 2016-02-06 LAB — BRAIN NATRIURETIC PEPTIDE: B Natriuretic Peptide: 101.7 pg/mL — ABNORMAL HIGH (ref 0.0–100.0)

## 2016-02-06 LAB — ETHANOL: Alcohol, Ethyl (B): <5 mg/dL (ref <5)

## 2016-02-06 LAB — MAGNESIUM: Magnesium: 2 mg/dL (ref 1.7–2.4)

## 2016-02-06 NOTE — Telephone Encounter (Signed)
Established with [facility].  Okay to refill?

## 2016-02-06 NOTE — Telephone Encounter (Signed)
New Message:    Pt went by ambulance last night to [facility], They told him to contact Dr Doug Sou office this morning.

## 2016-02-06 NOTE — Telephone Encounter (Signed)
Returned call to patient he stated around 9:00 pm last night he had a episode of fast heart beat.Stated he felt cold,shaky.B/P 195/83.Wife called 33, he was taken to [facility] ER.Stated he had lab work,xrays.Heart slowed down without medications.Stated he has rested today and feels good,no fast heart beat.Stated he was told to call Dr.Jordan.Advised Dr.Jordan out of office today.I will speak to him 02/07/16 and call you back.

## 2016-02-07 NOTE — Telephone Encounter (Signed)
Returned call to patient no answer.LMTC. 

## 2016-02-07 NOTE — Telephone Encounter (Signed)
Returned call to patient spoke to wife.Dr.Jordan advised keep appointment as planned 04/12/16.Advised to call back sooner if needed.

## 2016-02-08 ENCOUNTER — Other Ambulatory Visit: Payer: Self-pay | Admitting: General Surgery

## 2016-02-09 ENCOUNTER — Other Ambulatory Visit: Payer: Self-pay | Admitting: General Surgery

## 2016-02-12 ENCOUNTER — Encounter: Payer: Self-pay | Admitting: Internal Medicine

## 2016-02-12 ENCOUNTER — Ambulatory Visit (INDEPENDENT_AMBULATORY_CARE_PROVIDER_SITE_OTHER): Payer: BLUE CROSS/BLUE SHIELD | Admitting: Internal Medicine

## 2016-02-12 VITALS — BP 160/62 | HR 68 | Ht 67.0 in | Wt 224.8 lb

## 2016-02-12 DIAGNOSIS — Z86010 Personal history of colonic polyps: Secondary | ICD-10-CM | POA: Diagnosis not present

## 2016-02-12 DIAGNOSIS — K219 Gastro-esophageal reflux disease without esophagitis: Secondary | ICD-10-CM | POA: Diagnosis not present

## 2016-02-12 DIAGNOSIS — R1310 Dysphagia, unspecified: Secondary | ICD-10-CM | POA: Diagnosis not present

## 2016-02-12 NOTE — Progress Notes (Signed)
HISTORY OF PRESENT ILLNESS:  Todd Spears is a 70 y.o. male with past medical history as listed below. He presents today regarding surveillance colonoscopy and new problems with intermittent solid food dysphagia. Patient does have a history of adenomatous colon polyps in non-adenomatous polyps on previous colonoscopies in 2006 and 2012. Also moderate diverticulosis. He has no lower GI complaints but is due for follow-up. His new complaint is intermittent solid food dysphagia for about 6 months. He does have chronic GERD which is controlled with Nexium. No prior upper endoscopy. No weight loss. No blood thinners. He is scheduled for inguinal hernia repair with Dr. Donne Hazel in 2 weeks.  REVIEW OF SYSTEMS:  All non-GI ROS negative except for sinus allergy trouble, hearing impairment, heart murmur, ankle edema  Past Medical History:  Diagnosis Date  . Allergic rhinitis   . Anemia   . Aortic stenosis    a. Mild by echo 03/24/12.  . Asthma   . CAD (coronary artery disease)    a. s/p PTCA/stenting of the mid LAD and mid RCA 04/06/12  . Carotid bruit    r  . Colon polyp   . Diverticulosis   . ED (erectile dysfunction)   . Fatty liver   . GERD (gastroesophageal reflux disease)   . Gout   . Hyperlipidemia   . Hypertension   . Obesity   . Osteoarthritis   . Renal insufficiency   . Restless leg syndrome     Past Surgical History:  Procedure Laterality Date  . NM PET LYMPHOMA     left side of neck 1999  . PERCUTANEOUS CORONARY STENT INTERVENTION (PCI-S) N/A 04/06/2012   Procedure: PERCUTANEOUS CORONARY STENT INTERVENTION (PCI-S);  Surgeon: Peter M Martinique, MD; Promus DES for 90% mid LAD and 95% mid RCA disease      Social History CEJAY HOEKSEMA  reports that he quit smoking about 40 years ago. He has never used smokeless tobacco. He reports that he does not drink alcohol or use drugs.  family history includes Coronary artery disease in his father; Glaucoma in his mother; Heart attack  in his father; Heart disease in his father; Hypertension in his mother, paternal grandfather, and paternal grandmother; Stomach cancer in his mother; Stroke in his mother.  Allergies  Allergen Reactions  . Prednisone Shortness Of Breath and Swelling  . Crestor [Rosuvastatin Calcium] Other (See Comments)    myalgia  . Lyrica [Pregabalin]     "swelling"       PHYSICAL EXAMINATION: Vital signs: BP (!) 160/62 (BP Location: Left Arm, Patient Position: Sitting, Cuff Size: Normal)   Pulse 68   Ht 5\' 7"  (1.702 m)   Wt 224 lb 12.8 oz (102 kg)   BMI 35.21 kg/m   Constitutional: generally well-appearing, no acute distress Psychiatric: alert and oriented x3, cooperative Eyes: extraocular movements intact, anicteric, conjunctiva pink Mouth: oral pharynx moist, no lesions Neck: supple without thyromegaly Lymph: no lymphadenopathy Cardiovascular: heart regular rate and rhythm, systolic ejection murmur Lungs: clear to auscultation bilaterally Abdomen: soft, nontender, nondistended, no obvious ascites, no peritoneal signs, normal bowel sounds, no organomegaly Rectal: Deferred until colonoscopy Extremities: no clubbing cyanosis or lower extremity edema bilaterally Skin: no lesions on visible extremities Neuro: No focal deficits. Cranial nerves intact  ASSESSMENT:  #1. Chronic GERD. Classic symptoms controlled with Nexium #2. Intermittent solid food dysphagia. New. Rule out peptic stricture. Rule out neoplasia #3. History of adenomatous colon polyps. Due for surveillance   PLAN:  #1. Reflux precautions #2.  Continue Nexium #3. Schedule upper endoscopy with possible esophageal dilation.The nature of the procedure, as well as the risks, benefits, and alternatives were carefully and thoroughly reviewed with the patient. Ample time for discussion and questions allowed. The patient understood, was satisfied, and agreed to proceed. #4. Schedule surveillance colonoscopy.The nature of the  procedure, as well as the risks, benefits, and alternatives were carefully and thoroughly reviewed with the patient. Ample time for discussion and questions allowed. The patient understood, was satisfied, and agreed to proceed. #5. Patient wishes to wait until he has recovered from his hernia surgery to schedule his examinations. He may see a previsit nurse to help arrange the examinations.

## 2016-02-12 NOTE — Patient Instructions (Signed)
Call us when you are ready to schedule your colonsocopy

## 2016-02-14 ENCOUNTER — Encounter: Payer: Self-pay | Admitting: Internal Medicine

## 2016-02-19 ENCOUNTER — Encounter (HOSPITAL_BASED_OUTPATIENT_CLINIC_OR_DEPARTMENT_OTHER): Payer: Self-pay | Admitting: *Deleted

## 2016-02-21 ENCOUNTER — Encounter (HOSPITAL_BASED_OUTPATIENT_CLINIC_OR_DEPARTMENT_OTHER): Payer: Self-pay | Admitting: *Deleted

## 2016-02-21 NOTE — Progress Notes (Signed)
   02/21/16 1047  OBSTRUCTIVE SLEEP APNEA  Have you ever been diagnosed with sleep apnea through a sleep study? No  Do you snore loudly (loud enough to be heard through closed doors)?  0  Do you often feel tired, fatigued, or sleepy during the daytime (such as falling asleep during driving or talking to someone)? 0  Has anyone observed you stop breathing during your sleep? 0  Do you have, or are you being treated for high blood pressure? 1  BMI more than 35 kg/m2? 1  Age > 50 (1-yes) 1  Neck circumference greater than:Male 16 inches or larger, Male 17inches or larger? 0  Male Gender (Yes=1) 1  Obstructive Sleep Apnea Score 4

## 2016-02-23 ENCOUNTER — Encounter (HOSPITAL_BASED_OUTPATIENT_CLINIC_OR_DEPARTMENT_OTHER)
Admission: RE | Admit: 2016-02-23 | Discharge: 2016-02-23 | Disposition: A | Discharge status: Home / Self Care | Payer: BLUE CROSS/BLUE SHIELD | Source: Ambulatory Visit | Attending: General Surgery | Admitting: General Surgery

## 2016-02-23 DIAGNOSIS — K4090 Unilateral inguinal hernia, without obstruction or gangrene, not specified as recurrent: Secondary | ICD-10-CM | POA: Diagnosis not present

## 2016-02-23 DIAGNOSIS — Z6835 Body mass index (BMI) 35.0-35.9, adult: Secondary | ICD-10-CM | POA: Diagnosis not present

## 2016-02-23 DIAGNOSIS — J45909 Unspecified asthma, uncomplicated: Secondary | ICD-10-CM | POA: Diagnosis not present

## 2016-02-23 DIAGNOSIS — Z029 Encounter for administrative examinations, unspecified: Secondary | ICD-10-CM | POA: Diagnosis not present

## 2016-02-23 DIAGNOSIS — E7800 Pure hypercholesterolemia, unspecified: Secondary | ICD-10-CM | POA: Diagnosis not present

## 2016-02-23 DIAGNOSIS — E669 Obesity, unspecified: Secondary | ICD-10-CM | POA: Diagnosis not present

## 2016-02-23 DIAGNOSIS — Z79899 Other long term (current) drug therapy: Secondary | ICD-10-CM | POA: Diagnosis not present

## 2016-02-23 DIAGNOSIS — Z7982 Long term (current) use of aspirin: Secondary | ICD-10-CM | POA: Diagnosis not present

## 2016-02-23 DIAGNOSIS — Z87891 Personal history of nicotine dependence: Secondary | ICD-10-CM | POA: Diagnosis not present

## 2016-02-23 DIAGNOSIS — K219 Gastro-esophageal reflux disease without esophagitis: Secondary | ICD-10-CM | POA: Diagnosis not present

## 2016-02-23 DIAGNOSIS — I2510 Atherosclerotic heart disease of native coronary artery without angina pectoris: Secondary | ICD-10-CM | POA: Diagnosis not present

## 2016-02-23 DIAGNOSIS — I10 Essential (primary) hypertension: Secondary | ICD-10-CM | POA: Diagnosis not present

## 2016-02-23 LAB — CBC WITH DIFFERENTIAL/PLATELET
BASOS ABS: 0.1 10*3/uL (ref 0.0–0.1)
BASOS PCT: 1 %
EOS ABS: 0.5 10*3/uL (ref 0.0–0.7)
EOS PCT: 6 %
HCT: 35.2 % — ABNORMAL LOW (ref 39.0–52.0)
Hemoglobin: 11.8 g/dL — ABNORMAL LOW (ref 13.0–17.0)
LYMPHS PCT: 33 %
Lymphs Abs: 2.8 10*3/uL (ref 0.7–4.0)
MCH: 30.3 pg (ref 26.0–34.0)
MCHC: 33.5 g/dL (ref 30.0–36.0)
MCV: 90.3 fL (ref 78.0–100.0)
MONO ABS: 0.8 10*3/uL (ref 0.1–1.0)
Monocytes Relative: 10 %
Neutro Abs: 4.4 10*3/uL (ref 1.7–7.7)
Neutrophils Relative %: 50 %
PLATELETS: 233 10*3/uL (ref 150–400)
RBC: 3.9 MIL/uL — AB (ref 4.22–5.81)
RDW: 12.5 % (ref 11.5–15.5)
WBC: 8.7 10*3/uL (ref 4.0–10.5)

## 2016-02-23 LAB — BASIC METABOLIC PANEL
ANION GAP: 9 (ref 5–15)
BUN: 16 mg/dL (ref 6–20)
CALCIUM: 9.1 mg/dL (ref 8.9–10.3)
CO2: 24 mmol/L (ref 22–32)
Chloride: 100 mmol/L — ABNORMAL LOW (ref 101–111)
Creatinine, Ser: 1.31 mg/dL — ABNORMAL HIGH (ref 0.61–1.24)
GFR calc Af Amer: >60 mL/min (ref >60)
GFR, EST NON AFRICAN AMERICAN: 54 mL/min — AB (ref >60)
GLUCOSE: 90 mg/dL (ref 65–99)
POTASSIUM: 4.2 mmol/L (ref 3.5–5.1)
SODIUM: 133 mmol/L — AB (ref 135–145)

## 2016-02-23 NOTE — Progress Notes (Signed)
Pt had PCR screen and instructed pt on hibiclens, pt verbalized understanding.

## 2016-02-26 NOTE — Progress Notes (Signed)
Abnormal lab results reviewed by Dr Al Corpus no further treatment needed

## 2016-02-27 ENCOUNTER — Ambulatory Visit (HOSPITAL_BASED_OUTPATIENT_CLINIC_OR_DEPARTMENT_OTHER): Payer: BLUE CROSS/BLUE SHIELD | Admitting: Anesthesiology

## 2016-02-27 ENCOUNTER — Ambulatory Visit (HOSPITAL_BASED_OUTPATIENT_CLINIC_OR_DEPARTMENT_OTHER)
Admission: RE | Admit: 2016-02-27 | Discharge: 2016-02-27 | Disposition: A | Discharge status: Home / Self Care | Payer: BLUE CROSS/BLUE SHIELD | Source: Ambulatory Visit | Attending: General Surgery | Admitting: General Surgery

## 2016-02-27 ENCOUNTER — Encounter (HOSPITAL_BASED_OUTPATIENT_CLINIC_OR_DEPARTMENT_OTHER)
Admission: RE | Disposition: A | Discharge status: Home / Self Care | Payer: Self-pay | Source: Ambulatory Visit | Attending: General Surgery

## 2016-02-27 ENCOUNTER — Encounter (HOSPITAL_BASED_OUTPATIENT_CLINIC_OR_DEPARTMENT_OTHER): Payer: Self-pay

## 2016-02-27 DIAGNOSIS — Z6835 Body mass index (BMI) 35.0-35.9, adult: Secondary | ICD-10-CM | POA: Insufficient documentation

## 2016-02-27 DIAGNOSIS — Z7982 Long term (current) use of aspirin: Secondary | ICD-10-CM | POA: Insufficient documentation

## 2016-02-27 DIAGNOSIS — I10 Essential (primary) hypertension: Secondary | ICD-10-CM | POA: Insufficient documentation

## 2016-02-27 DIAGNOSIS — I2510 Atherosclerotic heart disease of native coronary artery without angina pectoris: Secondary | ICD-10-CM | POA: Insufficient documentation

## 2016-02-27 DIAGNOSIS — E669 Obesity, unspecified: Secondary | ICD-10-CM | POA: Insufficient documentation

## 2016-02-27 DIAGNOSIS — K219 Gastro-esophageal reflux disease without esophagitis: Secondary | ICD-10-CM | POA: Insufficient documentation

## 2016-02-27 DIAGNOSIS — Z87891 Personal history of nicotine dependence: Secondary | ICD-10-CM | POA: Insufficient documentation

## 2016-02-27 DIAGNOSIS — Z79899 Other long term (current) drug therapy: Secondary | ICD-10-CM | POA: Insufficient documentation

## 2016-02-27 DIAGNOSIS — K4090 Unilateral inguinal hernia, without obstruction or gangrene, not specified as recurrent: Secondary | ICD-10-CM | POA: Insufficient documentation

## 2016-02-27 DIAGNOSIS — J45909 Unspecified asthma, uncomplicated: Secondary | ICD-10-CM | POA: Insufficient documentation

## 2016-02-27 DIAGNOSIS — E7800 Pure hypercholesterolemia, unspecified: Secondary | ICD-10-CM | POA: Insufficient documentation

## 2016-02-27 HISTORY — PX: INGUINAL HERNIA REPAIR: SHX194

## 2016-02-27 HISTORY — PX: INSERTION OF MESH: SHX5868

## 2016-02-27 SURGERY — REPAIR, HERNIA, INGUINAL, ADULT
Anesthesia: General | Site: Groin | Laterality: Right

## 2016-02-27 MED ORDER — FENTANYL CITRATE (PF) 100 MCG/2ML IJ SOLN
50.0000 ug | INTRAMUSCULAR | Status: AC | PRN
  Administered 2016-02-27: 25 ug via INTRAVENOUS
  Administered 2016-02-27: 100 ug via INTRAVENOUS
  Administered 2016-02-27: 50 ug via INTRAVENOUS
  Administered 2016-02-27: 100 ug via INTRAVENOUS
  Administered 2016-02-27: 25 ug via INTRAVENOUS

## 2016-02-27 MED ORDER — PHENYLEPHRINE 40 MCG/ML (10ML) SYRINGE FOR IV PUSH (FOR BLOOD PRESSURE SUPPORT)
PREFILLED_SYRINGE | INTRAVENOUS | Status: DC | PRN
  Administered 2016-02-27: 40 ug via INTRAVENOUS
  Administered 2016-02-27 (×3): 80 ug via INTRAVENOUS
  Administered 2016-02-27: 40 ug via INTRAVENOUS

## 2016-02-27 MED ORDER — SUGAMMADEX SODIUM 200 MG/2ML IV SOLN
INTRAVENOUS | Status: AC
  Filled 2016-02-27: qty 2

## 2016-02-27 MED ORDER — FENTANYL CITRATE (PF) 100 MCG/2ML IJ SOLN: 25.0000 ug | INTRAMUSCULAR | Status: DC | PRN

## 2016-02-27 MED ORDER — LIDOCAINE 2% (20 MG/ML) 5 ML SYRINGE
INTRAMUSCULAR | Status: AC
  Filled 2016-02-27: qty 5

## 2016-02-27 MED ORDER — ONDANSETRON HCL 4 MG/2ML IJ SOLN
INTRAMUSCULAR | Status: DC | PRN
  Administered 2016-02-27: 4 mg via INTRAVENOUS

## 2016-02-27 MED ORDER — DEXAMETHASONE SODIUM PHOSPHATE 10 MG/ML IJ SOLN
INTRAMUSCULAR | Status: DC | PRN
  Administered 2016-02-27: 10 mg via INTRAVENOUS

## 2016-02-27 MED ORDER — LIDOCAINE HCL (CARDIAC) 20 MG/ML IV SOLN: INTRAVENOUS | Status: DC | PRN

## 2016-02-27 MED ORDER — ROCURONIUM BROMIDE 10 MG/ML (PF) SYRINGE
PREFILLED_SYRINGE | INTRAVENOUS | Status: AC
  Filled 2016-02-27: qty 10

## 2016-02-27 MED ORDER — CEFAZOLIN SODIUM-DEXTROSE 2-4 GM/100ML-% IV SOLN
INTRAVENOUS | Status: AC
  Filled 2016-02-27: qty 100

## 2016-02-27 MED ORDER — CHLORHEXIDINE GLUCONATE CLOTH 2 % EX PADS: 6.0000 | MEDICATED_PAD | Freq: Once | CUTANEOUS | Status: DC

## 2016-02-27 MED ORDER — MIDAZOLAM HCL 2 MG/2ML IJ SOLN
1.0000 mg | INTRAMUSCULAR | Status: DC | PRN
  Administered 2016-02-27 (×2): 1 mg via INTRAVENOUS

## 2016-02-27 MED ORDER — FENTANYL CITRATE (PF) 100 MCG/2ML IJ SOLN
INTRAMUSCULAR | Status: AC
  Filled 2016-02-27: qty 2

## 2016-02-27 MED ORDER — PHENYLEPHRINE 40 MCG/ML (10ML) SYRINGE FOR IV PUSH (FOR BLOOD PRESSURE SUPPORT)
PREFILLED_SYRINGE | INTRAVENOUS | Status: AC
  Filled 2016-02-27: qty 10

## 2016-02-27 MED ORDER — OXYCODONE-ACETAMINOPHEN 10-325 MG PO TABS: 1.0000 | ORAL_TABLET | Freq: Four times a day (QID) | ORAL | 0 refills | Status: DC | PRN

## 2016-02-27 MED ORDER — CEFAZOLIN SODIUM-DEXTROSE 2-4 GM/100ML-% IV SOLN
2.0000 g | INTRAVENOUS | Status: AC
  Administered 2016-02-27: 2 g via INTRAVENOUS

## 2016-02-27 MED ORDER — BUPIVACAINE-EPINEPHRINE (PF) 0.5% -1:200000 IJ SOLN
INTRAMUSCULAR | Status: DC | PRN
  Administered 2016-02-27: 25 mL

## 2016-02-27 MED ORDER — ONDANSETRON HCL 4 MG/2ML IJ SOLN: 4.0000 mg | Freq: Once | INTRAMUSCULAR | Status: DC | PRN

## 2016-02-27 MED ORDER — GLYCOPYRROLATE 0.2 MG/ML IJ SOLN: 0.2000 mg | Freq: Once | INTRAMUSCULAR | Status: DC | PRN

## 2016-02-27 MED ORDER — ONDANSETRON HCL 4 MG/2ML IJ SOLN
INTRAMUSCULAR | Status: AC
  Filled 2016-02-27: qty 2

## 2016-02-27 MED ORDER — SUGAMMADEX SODIUM 200 MG/2ML IV SOLN
INTRAVENOUS | Status: DC | PRN
  Administered 2016-02-27: 200 mg via INTRAVENOUS

## 2016-02-27 MED ORDER — SCOPOLAMINE 1 MG/3DAYS TD PT72: 1.0000 | MEDICATED_PATCH | Freq: Once | TRANSDERMAL | Status: DC | PRN

## 2016-02-27 MED ORDER — BUPIVACAINE HCL (PF) 0.25 % IJ SOLN
INTRAMUSCULAR | Status: DC | PRN
  Administered 2016-02-27: 10 mL

## 2016-02-27 MED ORDER — MIDAZOLAM HCL 2 MG/2ML IJ SOLN
INTRAMUSCULAR | Status: AC
  Filled 2016-02-27: qty 2

## 2016-02-27 MED ORDER — ROCURONIUM BROMIDE 10 MG/ML (PF) SYRINGE
PREFILLED_SYRINGE | INTRAVENOUS | Status: DC | PRN
  Administered 2016-02-27 (×2): 10 mg via INTRAVENOUS
  Administered 2016-02-27: 40 mg via INTRAVENOUS

## 2016-02-27 MED ORDER — LACTATED RINGERS IV SOLN
INTRAVENOUS | Status: DC
  Administered 2016-02-27 (×3): via INTRAVENOUS

## 2016-02-27 MED ORDER — PROPOFOL 500 MG/50ML IV EMUL
INTRAVENOUS | Status: DC | PRN
  Administered 2016-02-27: 150 mL via INTRAVENOUS

## 2016-02-27 MED ORDER — PROPOFOL 10 MG/ML IV BOLUS
INTRAVENOUS | Status: AC
Start: 2016-02-27 — End: 2016-02-27
  Filled 2016-02-27: qty 40

## 2016-02-27 MED ORDER — FENTANYL CITRATE (PF) 100 MCG/2ML IJ SOLN
INTRAMUSCULAR | Status: AC
  Filled 2016-02-27: qty 4

## 2016-02-27 MED ORDER — LIDOCAINE 2% (20 MG/ML) 5 ML SYRINGE
INTRAMUSCULAR | Status: DC | PRN
  Administered 2016-02-27: 100 mg via INTRAVENOUS

## 2016-02-27 SURGICAL SUPPLY — 46 items
ADH SKN CLS APL DERMABOND .7 (GAUZE/BANDAGES/DRESSINGS) ×1
BLADE CLIPPER SURG (BLADE) ×1 IMPLANT
BLADE SURG 15 STRL LF DISP TIS (BLADE) ×1 IMPLANT
BLADE SURG 15 STRL SS (BLADE) ×2
CHLORAPREP W/TINT 26ML (MISCELLANEOUS) ×2 IMPLANT
COVER BACK TABLE 60X90IN (DRAPES) ×2 IMPLANT
COVER MAYO STAND STRL (DRAPES) ×2 IMPLANT
DECANTER SPIKE VIAL GLASS SM (MISCELLANEOUS) IMPLANT
DERMABOND ADVANCED (GAUZE/BANDAGES/DRESSINGS) ×1
DERMABOND ADVANCED .7 DNX12 (GAUZE/BANDAGES/DRESSINGS) ×1 IMPLANT
DRAIN PENROSE 1/2X12 LTX STRL (WOUND CARE) ×2 IMPLANT
DRAPE LAPAROTOMY TRNSV 102X78 (DRAPE) ×2 IMPLANT
ELECT COATED BLADE 2.86 ST (ELECTRODE) ×2 IMPLANT
ELECT REM PT RETURN 9FT ADLT (ELECTROSURGICAL) ×2
ELECTRODE REM PT RTRN 9FT ADLT (ELECTROSURGICAL) ×1 IMPLANT
GLOVE BIO SURGEON STRL SZ7 (GLOVE) ×2 IMPLANT
GLOVE BIO SURGEON STRL SZ7.5 (GLOVE) ×1 IMPLANT
GLOVE BIOGEL PI IND STRL 7.0 (GLOVE) IMPLANT
GLOVE BIOGEL PI IND STRL 7.5 (GLOVE) ×1 IMPLANT
GLOVE BIOGEL PI INDICATOR 7.0 (GLOVE) ×1
GLOVE BIOGEL PI INDICATOR 7.5 (GLOVE) ×2
GOWN STRL REUS W/ TWL LRG LVL3 (GOWN DISPOSABLE) ×2 IMPLANT
GOWN STRL REUS W/ TWL XL LVL3 (GOWN DISPOSABLE) IMPLANT
GOWN STRL REUS W/TWL 2XL LVL3 (GOWN DISPOSABLE) ×1 IMPLANT
GOWN STRL REUS W/TWL LRG LVL3 (GOWN DISPOSABLE) ×2
GOWN STRL REUS W/TWL XL LVL3 (GOWN DISPOSABLE) ×2
MESH HERNIA SYS ULTRAPRO LRG (Mesh General) ×1 IMPLANT
MESH ULTRAPRO 3X6 7.6X15CM (Mesh General) ×1 IMPLANT
NEEDLE HYPO 22GX1.5 SAFETY (NEEDLE) ×2 IMPLANT
NS IRRIG 1000ML POUR BTL (IV SOLUTION) ×1 IMPLANT
PACK BASIN DAY SURGERY FS (CUSTOM PROCEDURE TRAY) ×2 IMPLANT
PENCIL BUTTON HOLSTER BLD 10FT (ELECTRODE) ×2 IMPLANT
SLEEVE SCD COMPRESS KNEE MED (MISCELLANEOUS) ×1 IMPLANT
SPONGE LAP 4X18 X RAY DECT (DISPOSABLE) ×2 IMPLANT
SUT MNCRL AB 4-0 PS2 18 (SUTURE) ×2 IMPLANT
SUT SILK 2 0 SH (SUTURE) IMPLANT
SUT VIC AB 0 SH 27 (SUTURE) IMPLANT
SUT VIC AB 2-0 SH 18 (SUTURE) ×3 IMPLANT
SUT VIC AB 2-0 SH 27 (SUTURE)
SUT VIC AB 2-0 SH 27XBRD (SUTURE) IMPLANT
SUT VIC AB 3-0 SH 27 (SUTURE) ×2
SUT VIC AB 3-0 SH 27X BRD (SUTURE) ×1 IMPLANT
SUT VICRYL AB 3 0 TIES (SUTURE) IMPLANT
SYR CONTROL 10ML LL (SYRINGE) ×2 IMPLANT
TOWEL OR 17X24 6PK STRL BLUE (TOWEL DISPOSABLE) ×1 IMPLANT
TOWEL OR NON WOVEN STRL DISP B (DISPOSABLE) ×2 IMPLANT

## 2016-02-27 NOTE — H&P (Signed)
  60 yom i have seen in past for right groin pain. he had some possible adenopathy and then on ct didnt have anything concerning. I couldnt really feel a hernia at that time. since then he has begun to notice a mass in his right groin that comes and goes. this is causing him some discomfort. he has no n/v, it does go down. he would like to consider having this repaired   Other Problems Hypercholesterolemia Other disease, cancer, significant illness High blood pressure Asthma Gastroesophageal Reflux Disease  Allergies  PredniSONE (Pak) *CORTICOSTEROIDS* Swelling.  Medication History Multiple Vitamin (Oral) Active. Nasonex (50MCG/ACT Suspension, Nasal) Active. Aspirin EC (81MG  Tablet DR, Oral) Active. AmLODIPine Besylate (10MG  Tablet, Oral) Active. Crestor (5MG  Tablet, Oral) Active. Colcrys (0.6MG  Tablet, Oral) Active. HydrALAZINE HCl (25MG  Tablet, Oral) Active. Lisinopril (20MG  Tablet, Oral) Active. NexIUM (40MG  Capsule DR, Oral) Active. Toprol XL (50MG  Tablet ER 24HR, Oral) Active. Uloric (80MG  Tablet, Oral) Active. Medications Reconciled  Social History  No drug use Tobacco use Former smoker. Caffeine use Coffee. No alcohol use  Family History  Hypertension Father. Respiratory Condition Father. Heart Disease Father. Alcohol Abuse Brother, Sister. Cancer Mother.  Vitals  Weight: 221 lb Height: 61in Body Surface Area: 1.97 m Body Mass Index: 41.76 kg/m  Temp.: 97.57F(Temporal)  Pulse: 68 (Regular)  BP: 128/78 (Sitting, Left Arm, Standard) Physical Exam  General Mental Status-Alert. Orientation-Oriented X3. Chest and Lung Exam Chest and lung exam reveals -quiet, even and easy respiratory effort with no use of accessory muscles and on auscultation, normal breath sounds, no adventitious sounds and normal vocal resonance. Cardiovascular Cardiovascular examination reveals -normal heart sounds, regular rate and rhythm  with no murmurs. Abdomen Note: soft nontender obese Male Genitourinary Note: no lih, mildly tender reducible rih   Assessment & Plan  INGUINAL HERNIA, RIGHT  We discussed observation versus repair.  We discussed both laparoscopic and open inguinal hernia repairs. I described the procedure in detail.  The patient was given educational material.  Goals should be achieved with surgery. We discussed the usage of mesh and the rationale behind that. We went over the pathophysiology of inguinal hernia. We have elected to perform open inguinal hernia repair with mesh.  We discussed the risks including bleeding, infection, recurrence, postoperative pain and chronic groin pain, testicular injury, urinary retention, numbness in groin and around incision.

## 2016-02-27 NOTE — Discharge Instructions (Signed)
[facility] ° °UMBILICAL OR INGUINAL HERNIA REPAIR: POST OP INSTRUCTIONS ° °Always review your discharge instruction sheet given to you by the facility where your surgery was performed. °IF YOU HAVE DISABILITY OR FAMILY LEAVE FORMS, YOU MUST BRING THEM TO THE OFFICE FOR PROCESSING.   °DO NOT GIVE THEM TO YOUR DOCTOR. ° °1. A  prescription for pain medication may be given to you upon discharge.  Take your pain medication as prescribed, if needed.  If narcotic pain medicine is not needed, then you may take acetaminophen (Tylenol), naprosyn (Alleve) or ibuprofen (Advil) as needed. °2. Take your usually prescribed medications unless otherwise directed. °3. If you need a refill on your pain medication, please contact your pharmacy.  They will contact our office to request authorization. Prescriptions will not be filled after 5 pm or on week-ends. °4. You should follow a light diet the first 24 hours after arrival home, such as soup and crackers, etc.  Be sure to include lots of fluids daily.  Resume your normal diet the day after surgery. °5. Most patients will experience some swelling and bruising around the umbilicus or in the groin and scrotum.  Ice packs and reclining will help.  Swelling and bruising can take several days to resolve.  °6. It is common to experience some constipation if taking pain medication after surgery.  Increasing fluid intake and taking a stool softener (such as Colace) will usually help or prevent this problem from occurring.  A mild laxative (Milk of Magnesia or Miralax) should be taken according to package directions if there are no bowel movements after 48 hours. °7. Unless discharge instructions indicate otherwise, you may remove your bandages 48 hours after surgery, and you may shower at that time.  You may have steri-strips (small skin tapes) in place directly over the incision.  These strips should be left on the skin for 7-10 days and will come off on their own.   If your surgeon used skin glue on the incision, you may shower in 24 hours.  The glue will flake off over the next 2-3 weeks.  Any sutures or staples will be removed at the office during your follow-up visit. °8. ACTIVITIES:  You may resume regular (light) daily activities beginning the next day--such as daily self-care, walking, climbing stairs--gradually increasing activities as tolerated.  You may have sexual intercourse when it is comfortable.  Refrain from any heavy lifting or straining until approved by your doctor. °a. You may drive when you are no longer taking prescription pain medication, you can comfortably wear a seatbelt, and you can safely maneuver your car and apply brakes. °b. RETURN TO WORK:  __________________________________________________________ °9. You should see your doctor in the office for a follow-up appointment approximately 2-3 weeks after your surgery.  Make sure that you call for this appointment within a day or two after you arrive home to insure a convenient appointment time. °10. OTHER INSTRUCTIONS:  __________________________________________________________________________________________________________________________________________________________________________________________  °WHEN TO CALL YOUR DOCTOR: °1. Fever over 101.0 °2. Inability to urinate °3. Nausea and/or vomiting °4. Extreme swelling or bruising °5. Continued bleeding from incision. °6. Increased pain, redness, or drainage from the incision ° °The clinic staff is available to answer your questions during regular business hours.  Please don’t hesitate to call and ask to speak to one of the nurses for clinical concerns.  If you have a medical emergency, go to the nearest emergency room or call 911.  A surgeon from [facility]   is always on call at the hospital ° ° °[address], [address], [place], [place]  [address] ? ° [address] [address], [place], [place]   [address] °[phone] ? [phone] ? FAX  [phone] °Web site: [email or web address] ° ° °Post Anesthesia Home Care Instructions ° °Activity: °Get plenty of rest for the remainder of the day. A responsible adult should stay with you for 24 hours following the procedure.  °For the next 24 hours, DO NOT: °-Drive a car °-Operate machinery °-Drink alcoholic beverages °-Take any medication unless instructed by your physician °-Make any legal decisions or sign important papers. ° °Meals: °Start with liquid foods such as gelatin or soup. Progress to regular foods as tolerated. Avoid greasy, spicy, heavy foods. If nausea and/or vomiting occur, drink only clear liquids until the nausea and/or vomiting subsides. Call your physician if vomiting continues. ° °Special Instructions/Symptoms: °Your throat may feel dry or sore from the anesthesia or the breathing tube placed in your throat during surgery. If this causes discomfort, gargle with warm salt water. The discomfort should disappear within 24 hours. ° °If you had a scopolamine patch placed behind your ear for the management of post- operative nausea and/or vomiting: ° °1. The medication in the patch is effective for 72 hours, after which it should be removed.  Wrap patch in a tissue and discard in the trash. Wash hands thoroughly with soap and water. °2. You may remove the patch earlier than 72 hours if you experience unpleasant side effects which may include dry mouth, dizziness or visual disturbances. °3. Avoid touching the patch. Wash your hands with soap and water after contact with the patch. °  ° °

## 2016-02-27 NOTE — Anesthesia Procedure Notes (Signed)
Procedure Name: Intubation Date/Time: 02/27/2016 9:53 AM Performed by: Wanita Chamberlain Pre-anesthesia Checklist: Patient identified, Timeout performed, Emergency Drugs available, Suction available and Patient being monitored Patient Re-evaluated:Patient Re-evaluated prior to inductionOxygen Delivery Method: Circle system utilized Preoxygenation: Pre-oxygenation with 100% oxygen Intubation Type: IV induction Ventilation: Mask ventilation without difficulty Laryngoscope Size: Mac and 3 Grade View: Grade II Tube type: Oral Tube size: 8.0 mm Number of attempts: 1 Airway Equipment and Method: Stylet Placement Confirmation: breath sounds checked- equal and bilateral,  positive ETCO2 and ETT inserted through vocal cords under direct vision Secured at: 22 cm Tube secured with: Tape Dental Injury: Teeth and Oropharynx as per pre-operative assessment

## 2016-02-27 NOTE — Progress Notes (Signed)
Assisted Dr. Lauretta Grill with right, ultrasound guided, transabdominal plane block. Side rails up, monitors on throughout procedure. See vital signs in flow sheet. Tolerated Procedure well.

## 2016-02-27 NOTE — Anesthesia Preprocedure Evaluation (Signed)
Anesthesia Evaluation  Patient identified by MRN, date of birth, ID band Patient awake    Reviewed: Allergy & Precautions, NPO status , Patient's Chart, lab work & pertinent test results  History of Anesthesia Complications Negative for: history of anesthetic complications  Airway Mallampati: II  TM Distance: >3 FB Neck ROM: Full    Dental no notable dental hx. (+) Dental Advisory Given   Pulmonary asthma , former smoker,    Pulmonary exam normal breath sounds clear to auscultation       Cardiovascular hypertension, Pt. on medications + CAD  Normal cardiovascular exam+ Valvular Problems/Murmurs  Rhythm:Regular Rate:Normal  ECHO 2017: Left ventricle: The cavity size was normal. Wall thickness was   normal. Systolic function was normal. The estimated ejection   fraction was in the range of 60% to 65%. Wall motion was normal;   there were no regional wall motion abnormalities. - Aortic valve: Moderately calcified annulus. Moderately calcified   leaflets. There was mild stenosis. - Mitral valve: There was mild regurgitation. - Left atrium: The atrium was mildly dilated.    Neuro/Psych negative neurological ROS  negative psych ROS   GI/Hepatic Neg liver ROS, GERD  ,  Endo/Other  negative endocrine ROS  Renal/GU negative Renal ROS  negative genitourinary   Musculoskeletal  (+) Arthritis ,   Abdominal   Peds negative pediatric ROS (+)  Hematology negative hematology ROS (+)   Anesthesia Other Findings   Reproductive/Obstetrics negative OB ROS                             Anesthesia Physical Anesthesia Plan  ASA: III  Anesthesia Plan: General   Post-op Pain Management: GA combined w/ Regional for post-op pain   Induction: Intravenous  Airway Management Planned: LMA  Additional Equipment:   Intra-op Plan:   Post-operative Plan: Extubation in OR  Informed Consent: I have  reviewed the patients History and Physical, chart, labs and discussed the procedure including the risks, benefits and alternatives for the proposed anesthesia with the patient or authorized representative who has indicated his/her understanding and acceptance.   Dental advisory given  Plan Discussed with: CRNA  Anesthesia Plan Comments:         Anesthesia Quick Evaluation

## 2016-02-27 NOTE — Anesthesia Postprocedure Evaluation (Signed)
Anesthesia Post Note  Patient: Todd Spears  Procedure(s) Performed: Procedure(s) (LRB): HERNIA REPAIR INGUINAL ADULT (Right) INSERTION OF MESH--IN AND OUT CATHETERIZATION PERFORMED AT END OF PROCEDURE TO DRAIN 400ML URINE. (Right)  Patient location during evaluation: PACU Anesthesia Type: General Level of consciousness: awake and alert Pain management: pain level controlled Vital Signs Assessment: post-procedure vital signs reviewed and stable Respiratory status: spontaneous breathing, nonlabored ventilation, respiratory function stable and patient connected to nasal cannula oxygen Cardiovascular status: blood pressure returned to baseline and stable Postop Assessment: no signs of nausea or vomiting Anesthetic complications: no    Last Vitals:  Vitals:   02/27/16 1215 02/27/16 1230  BP: (!) 147/56 (!) 148/60  Pulse: 65 66  Resp: 17 (!) 24  Temp:      Last Pain:  Vitals:   02/27/16 1230  TempSrc:   PainSc: 2                  Susan Bleich JENNETTE

## 2016-02-27 NOTE — Transfer of Care (Signed)
Immediate Anesthesia Transfer of Care Note  Patient: Todd Spears  Procedure(s) Performed: Procedure(s): HERNIA REPAIR INGUINAL ADULT (Right) INSERTION OF MESH (Right)  Patient Location: PACU  Anesthesia Type:General  Level of Consciousness: awake, alert , oriented and patient cooperative  Airway & Oxygen Therapy: Patient Spontanous Breathing and Patient connected to face mask oxygen  Post-op Assessment: Report given to RN and Post -op Vital signs reviewed and stable  Post vital signs: Reviewed and stable  Last Vitals:  Vitals:   02/27/16 0925 02/27/16 0930  BP:  (!) 183/73  Pulse: 64 69  Resp: 18 16  Temp:      Last Pain:  Vitals:   02/27/16 0844  TempSrc: Oral         Complications: No apparent anesthesia complications

## 2016-02-27 NOTE — Interval H&P Note (Signed)
History and Physical Interval Note:  02/27/2016 9:39 AM  Todd Spears  has presented today for surgery, with the diagnosis of Right inguinal hernia  The various methods of treatment have been discussed with the patient and family. After consideration of risks, benefits and other options for treatment, the patient has consented to  Procedure(s): HERNIA REPAIR INGUINAL ADULT (Right) INSERTION OF MESH (Right) as a surgical intervention .  The patient's history has been reviewed, patient examined, no change in status, stable for surgery.  I have reviewed the patient's chart and labs.  Questions were answered to the patient's satisfaction.     Atif Chapple

## 2016-02-27 NOTE — Anesthesia Procedure Notes (Signed)
Anesthesia Regional Block:  TAP block  Pre-Anesthetic Checklist: ,, timeout performed, Correct Patient, Correct Site, Correct Laterality, Correct Procedure, Correct Position, site marked, Risks and benefits discussed,  Surgical consent,  Pre-op evaluation,  At surgeon's request and post-op pain management  Laterality: Right  Prep: Maximum Sterile Barrier Precautions used, chloraprep       Needles:  Injection technique: Single-shot  Needle Type: Echogenic Stimulator Needle     Needle Length: 10cm 10 cm Needle Gauge: 21 G    Additional Needles:  Procedures: ultrasound guided (picture in chart) and nerve stimulator TAP block Narrative:  Injection made incrementally with aspirations every 5 mL.  Performed by: Personally  Anesthesiologist: Korri Ask, [facility]  Additional Notes: Patient tolerated the procedure well without complications

## 2016-02-27 NOTE — Op Note (Signed)
Preoperative diagnosis: Right inguinal hernia Postoperative diagnosis: Pantaloon right inguinal hernia Procedure: Right inguinal hernia repair with mesh Surgeon: Dr. Serita Grammes Estimated blood loss: Minimal Anesthesia: Gen. with tap block Obligations: None Drains: None Specimens: None Sponge and needle count was correct at completion Disposition to recovery in stable condition  Indications: This is a 70 year old male who has what appears to be a scrotal right inguinal hernia. This is becoming increasingly symptomatic. We discussed options to fix it and elected to perform an open inguinal hernia repair with mesh. Risks were discussed prior to beginning.  Procedure: After informed consent was obtained the patient was taken to the operating room. He was administered antibiotics. SCDs were in place. He underwent a tap block prior to beginning. He was then placed under general anesthesia. His right groin was prepped and draped in the standard sterile surgical fashion. A surgical timeout was then performed.  I infiltrated Marcaine along the incision in the right groin. I then made an incision carried this out down to the external oblique. This was difficult due to his habitus. I then entered into the abdominal oblique and through the external ring. He was noted to have a very large pantaloon hernia. With some difficulty I eventually was able to encircle the spermatic cord and separate these from each other. This tracked all the way down to his testicle. I then eventually divided the sac. I then removed the sac and oversewed this with 2-0 Vicryl suture. Then placed this back into the peritoneal cavity. Once I had done that I then opened up the direct space. I mobilized this with a Ray-Tec sponge. I then inserted a large ultrapro hernia system and folded out the bottom portion of the bilayer. I then closed the floor with 2-0 Vicryl over this. I then laid the top portion of the bilayer flat. I secured  this to the pubic tubercle with 2-0 Vicryl sutures. I secured this to the inguinal ligament inferiorly with 2-0 Vicryl sutures every centimeter. I then made a cut the mesh and wrapped around the spermatic cord. I secured this to the internal oblique with 2-0 Vicryl superiorly. I then placed another small piece of mesh that I secured to the existing one. This was Ultrapro as well. I wanted to do this to cover it laterally as much as possible as the original mesh was not long enough. I secured this with 2-0 Vicryl inferiorly to the inguinal ligament as well. This appeared to completely obliterate the defect. Hemostasis was observed. I closed the external oblique with 2-0 Vicryl. Scarpa's fascia was closed with 2-0 Vicryl. Then closed the skin with 4-0 Monocryl and glue. His testicle was back in the scrotum at the completion of the operation he did get in and out catheterization prior to awakening as well. He was then transferred to recovery in stable condition.

## 2016-02-28 NOTE — Addendum Note (Signed)
Addendum  created 02/28/16 0751 by Ernesta Amble Jourdyn Hasler, CRNA   Charge Capture section accepted

## 2016-03-01 ENCOUNTER — Encounter (HOSPITAL_BASED_OUTPATIENT_CLINIC_OR_DEPARTMENT_OTHER): Payer: Self-pay | Admitting: General Surgery

## 2016-03-12 ENCOUNTER — Other Ambulatory Visit: Payer: Self-pay | Admitting: Family Medicine

## 2016-03-21 ENCOUNTER — Ambulatory Visit (INDEPENDENT_AMBULATORY_CARE_PROVIDER_SITE_OTHER): Payer: BLUE CROSS/BLUE SHIELD

## 2016-03-21 DIAGNOSIS — Z23 Encounter for immunization: Secondary | ICD-10-CM

## 2016-03-28 ENCOUNTER — Telehealth: Payer: Self-pay | Admitting: Cardiology

## 2016-03-28 NOTE — Telephone Encounter (Signed)
Received records from [facility] for appointment on 04/12/16 with Dr Martinique,.  Records given to [facility] (medical records) for Dr Doug Sou schedule on 04/12/16. lp

## 2016-04-09 ENCOUNTER — Ambulatory Visit (AMBULATORY_SURGERY_CENTER): Payer: Self-pay

## 2016-04-09 VITALS — Ht 67.0 in

## 2016-04-09 DIAGNOSIS — Z860100 Personal history of colon polyps, unspecified: Secondary | ICD-10-CM

## 2016-04-09 DIAGNOSIS — R1310 Dysphagia, unspecified: Secondary | ICD-10-CM

## 2016-04-09 MED ORDER — NA SULFATE-K SULFATE-MG SULF 17.5-3.13-1.6 GM/177ML PO SOLN: ORAL | 0 refills | Status: DC

## 2016-04-09 NOTE — Progress Notes (Signed)
Per pt, no allergies to soy or egg products.Pt not taking any weight loss meds or using  O2 at home. 

## 2016-04-11 NOTE — Progress Notes (Signed)
Todd Spears Date of Birth: 1946-04-17 Medical Record #[id number]  History of Present Illness: Todd Spears is seen back today for follow up CAD. He is s/p 2 vessel PCI with Promus DES for 90% mid LAD and 95% mid RCA disease on 04/06/2012. His EF is normal at 55%. At that time he really had no significant anginal symptoms but had an abnormal stress test.  Myoview in Nov. 2014 showed a fixed inferior defect without ischemia. Last Myoview in Feb. 2017 was unchanged. Other issues include CKD stage 3, HTN, obesity, gout,,OA, ED, carotid bruit and asthma.  He was admitted in January 2017 with tremors and hyponatremia. Chlorthalidone was discontinued. He is feeling better.  He had carotid dopplers showing 1-39% RICA and 12-24% LICA stenoses. Echo done in March 2017 showed normal LV function and mild AS unchanged from 2013.  On follow up today he denies any chest pain or SOB. No TIA or CVA symptoms. 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 Kidney function has been stable. He recently had a right inguinal hernia repair and this went well. He is scheduled for upper and lower EGD soon. He does have a history of intolerance to statins. States they all gave him bad cramps. Doesn't think he has tried Zetia. Typically BP at home is between 497-530 systolic.    Current Outpatient Prescriptions on File Prior to Visit  Medication Sig Dispense Refill  . albuterol (PROVENTIL HFA;VENTOLIN HFA) 108 (90 BASE) MCG/ACT inhaler Inhale 2 puffs into the lungs every 6 (six) hours as needed. For shortness of breath 2 Inhaler 1  . amLODipine (NORVASC) 10 MG tablet TAKE 1 TABLET DAILY 100 tablet 2  . aspirin 81 MG chewable tablet Chew 81 mg by mouth daily.    . cetirizine (ZYRTEC) 10 MG tablet Take 10 mg by mouth every morning.    . cetirizine (ZYRTEC) 10 MG tablet Take 10 mg by mouth daily.    Marland Kitchen COLCRYS 0.6 MG tablet Take 0.6 mg by mouth daily as needed (for gout flareups). Reported on 10/26/2015    .  Cyanocobalamin (VITAMIN B 12 PO) Take 1 tablet by mouth daily.     Marland Kitchen esomeprazole (NEXIUM) 40 MG capsule TAKE 1 CAPSULE DAILY BEFORE BREAKFAST 100 capsule 2  . FeFum-FePoly-FA-B Cmp-C-Biot (INTEGRA PLUS) CAPS Take 1 tablet by mouth daily.    Marland Kitchen lisinopril (PRINIVIL,ZESTRIL) 20 MG tablet TAKE 1 TABLET TWICE A DAY 200 tablet 2  . loratadine (CLARITIN) 10 MG tablet Take 10 mg by mouth at bedtime.     Marland Kitchen losartan (COZAAR) 50 MG tablet Take 50 mg by mouth daily.    . metoprolol succinate (TOPROL-XL) 50 MG 24 hr tablet TAKE 1 TABLET DAILY WITH OR IMMEDIATELY FOLLOWING A MEAL 100 tablet 2  . montelukast (SINGULAIR) 10 MG tablet TAKE 1 TABLET (10 MG TOTAL) BY MOUTH AT BEDTIME. 30 tablet 3  . Multiple Vitamin (MULTIVITAMIN WITH MINERALS) TABS tablet Take 1 tablet by mouth daily.    . Multiple Vitamins-Minerals (VISION-VITE PRESERVE PO) Take 1 tablet by mouth 2 (two) times daily.     . Na Sulfate-K Sulfate-Mg Sulf (SUPREP BOWEL PREP KIT) 17.5-3.13-1.6 GM/180ML SOLN Suprep as directed / no substitutions 354 mL 0  . nitroGLYCERIN (NITROSTAT) 0.4 MG SL tablet Place 1 tablet (0.4 mg total) under the tongue every 5 (five) minutes as needed for chest pain (up to 3 doses. not within 24hrs of Viagra). 25 tablet 6  . oxyCODONE-acetaminophen (PERCOCET)  10-325 MG tablet Take 1 tablet by mouth every 6 (six) hours as needed for pain. 20 tablet 0  . Saline (SIMPLY SALINE) 0.9 % AERS Place 1 spray into the nose daily as needed (congestion).     . traMADol (ULTRAM) 50 MG tablet TAKE 1 TABLET EVERY 6 HOURS (Patient taking differently: TAKE 1 TABLET EVERY 6 HOURS AS NEEDED FOR PAIN) 120 tablet 1  . ULORIC 80 MG TABS Take 1 tablet by mouth daily. Reported on 10/26/2015     No current facility-administered medications on file prior to visit.     Allergies  Allergen Reactions  . Prednisone Shortness Of Breath and Swelling  . Crestor [Rosuvastatin Calcium] Other (See Comments)    myalgia  . Lyrica [Pregabalin]     "swelling"     Past Medical History:  Diagnosis Date  . Allergic rhinitis   . Anemia   . Asthma   . CAD (coronary artery disease)    a. s/p PTCA/stenting of the mid LAD and mid RCA 04/06/12  . Carotid bruit    r  . Colon polyp   . Diverticulosis   . ED (erectile dysfunction)   . Fatty liver   . GERD (gastroesophageal reflux disease)   . Gout   . Hyperlipidemia   . Hypertension   . Obesity   . Osteoarthritis   . Renal insufficiency   . Restless leg syndrome     Past Surgical History:  Procedure Laterality Date  . INGUINAL HERNIA REPAIR Right 02/27/2016   Procedure: HERNIA REPAIR INGUINAL ADULT;  Surgeon: Rolm Bookbinder, MD;  Location: [facility];  Service: General;  Laterality: Right;  . INSERTION OF MESH Right 02/27/2016   Procedure: INSERTION OF MESH--IN AND OUT CATHETERIZATION PERFORMED AT END OF PROCEDURE TO DRAIN 400ML URINE.;  Surgeon: Rolm Bookbinder, MD;  Location: [facility];  Service: General;  Laterality: Right;  . NM PET LYMPHOMA     left side of neck 1999  . PERCUTANEOUS CORONARY STENT INTERVENTION (PCI-S) N/A 04/06/2012   Procedure: PERCUTANEOUS CORONARY STENT INTERVENTION (PCI-S);  Surgeon: Peter M Martinique, MD; Promus DES for 90% mid LAD and 95% mid RCA disease      History  Smoking Status  . Former Smoker  . Quit date: 06/04/1975  Smokeless Tobacco  . Never Used    History  Alcohol Use No    Family History  Problem Relation Age of Onset  . Coronary artery disease Father     in his 58s  . Heart disease Father   . Heart attack Father   . Hypertension Mother   . Stroke Mother     Several   . Stomach cancer Mother   . Glaucoma Mother   . Hypertension Paternal Grandmother   . Hypertension Paternal Grandfather     Review of Systems: The review of systems is per the HPI.  All other systems were reviewed and are negative.  Physical Exam: BP (!) 153/69   Pulse (!) 52   Ht _0  (1.702 m)   Wt 222 lb 9.6 oz (101 kg)   BMI  34.86 kg/m  Patient is an obese WM in no acute distress. Skin is warm and dry. Color is normal.  HEENT is unremarkable. Normocephalic/atraumatic. PERRL. Sclera are nonicteric. Neck is supple. No masses. No JVD. Lungs are clear. Cardiac exam shows a regular rate and rhythm. normal S1-2. No gallop or murmur. Abdomen is soft. Extremities show 1+ right ankle edema.  Gait and  ROM are intact. No gross neurologic deficits noted.  LABORATORY DATA: Lab Results  Component Value Date   WBC 8.7 02/23/2016   HGB 11.8 (L) 02/23/2016   HCT 35.2 (L) 02/23/2016   PLT 233 02/23/2016   GLUCOSE 90 02/23/2016   CHOL 223 (H) 10/26/2015   TRIG 74.0 10/26/2015   HDL 44.60 10/26/2015   LDLDIRECT 164.9 12/10/2010   LDLCALC 163 (H) 10/26/2015   ALT 23 10/26/2015   AST 25 10/26/2015   NA 133 (L) 02/23/2016   K 4.2 02/23/2016   CL 100 (L) 02/23/2016   CREATININE 1.31 (H) 02/23/2016   BUN 16 02/23/2016   CO2 24 02/23/2016   TSH 2.659 07/04/2015   PSA 0.63 10/26/2015   INR 1.0 04/03/2012   HGBA1C 5.6 10/26/2015   Lexiscan myoview 07/28/15: Study Highlights     There was no ST segment deviation noted during stress.  The left ventricular ejection fraction is normal (55-65%).  Nuclear stress EF: 56%.  Defect 1: There is a medium defect of moderate severity present in the basal inferoseptal, basal inferior, mid inferior and apical inferior location. In setting of normal LVF, this is consistent with diaphragmatic attenuation artifact. No ischemia noted.  This is a low risk study.    Addendum by Sueanne Margarita, MD on Fri Jul 28, 2015 2:03 PM    There was no ST segment deviation noted during stress.  The left ventricular ejection fraction is normal (55-65%).  Nuclear stress EF: 56%.  Defect 1: There is a medium defect of moderate severity present in the basal inferoseptal, basal inferior, mid inferior and apical inferior location. In setting of normal LVF, this is consistent with diaphragmatic  attenuation artifact. No ischemia noted.  This is a low risk study.    Echo: 08/25/15: Study Conclusions  - Left ventricle: The cavity size was normal. Wall thickness was   normal. Systolic function was normal. The estimated ejection   fraction was in the range of 60% to 65%. Wall motion was normal;   there were no regional wall motion abnormalities. - Aortic valve: Moderately calcified annulus. Moderately calcified   leaflets. There was mild stenosis. - Mitral valve: There was mild regurgitation. - Left atrium: The atrium was mildly dilated  Assessment / Plan:  1. CAD - status post 2 vessel PCI with DES in November 2013- doing well. Continue current therapy with aspirin.  Myoview study in February 2017 was low risk with an area of inferior scar and no ischemia. Unchanged from 2014. We will continue medical therapy and risk factor modification.   2. HTN - blood pressure is elevated today but typically under OK control. He reports it does fluctuate. He is on multiple meds. Will monitor on current therapy.  3. Hyponatremia secondary to chlorthalidone. Improved.  4. CKD stage 3. Last creatinine per Renal 1.36.   5. Hyperlipidemia. Intolerant of statins. Continue with  dietary modification and weight loss. Will try Crestor 5 mg twice a week. Will arrange follow up in our lipid clinic. May want to add Zetia. If above fails we can see if he qualifies for PCSK 9 inhibitor.   6. Carotid arterial disease with moderate 16-10% LICA stenosis. Asymptomatic. Repeat dopplers in one year.  7. History of mild Aortic stenosis. Last Echo in 10/13. Will update now.

## 2016-04-12 ENCOUNTER — Encounter: Payer: Self-pay | Admitting: Cardiology

## 2016-04-12 ENCOUNTER — Encounter: Payer: Self-pay | Admitting: Internal Medicine

## 2016-04-12 ENCOUNTER — Ambulatory Visit (INDEPENDENT_AMBULATORY_CARE_PROVIDER_SITE_OTHER): Payer: BLUE CROSS/BLUE SHIELD | Admitting: Cardiology

## 2016-04-12 VITALS — BP 153/69 | HR 52 | Ht 67.0 in | Wt 222.6 lb

## 2016-04-12 DIAGNOSIS — I2510 Atherosclerotic heart disease of native coronary artery without angina pectoris: Secondary | ICD-10-CM | POA: Diagnosis not present

## 2016-04-12 DIAGNOSIS — I350 Nonrheumatic aortic (valve) stenosis: Secondary | ICD-10-CM | POA: Diagnosis not present

## 2016-04-12 DIAGNOSIS — I10 Essential (primary) hypertension: Secondary | ICD-10-CM

## 2016-04-12 DIAGNOSIS — E7800 Pure hypercholesterolemia, unspecified: Secondary | ICD-10-CM | POA: Diagnosis not present

## 2016-04-12 MED ORDER — ROSUVASTATIN CALCIUM 5 MG PO TABS: 5.0000 mg | ORAL_TABLET | Freq: Every day | ORAL | 3 refills | Status: DC

## 2016-04-12 NOTE — Patient Instructions (Signed)
Continue your current therapy  We will start Crestor 5 mg twice a week.   We will have you follow up in our lipid clinic to work on getting your cholesterol down.   I will see you in 6 months.

## 2016-04-23 ENCOUNTER — Other Ambulatory Visit: Payer: Self-pay | Admitting: Family Medicine

## 2016-04-23 ENCOUNTER — Ambulatory Visit (AMBULATORY_SURGERY_CENTER): Payer: BLUE CROSS/BLUE SHIELD | Admitting: Internal Medicine

## 2016-04-23 ENCOUNTER — Encounter: Payer: Self-pay | Admitting: Internal Medicine

## 2016-04-23 VITALS — BP 104/65 | HR 55 | Temp 97.8°F | Resp 21 | Ht 67.0 in | Wt 222.0 lb

## 2016-04-23 DIAGNOSIS — K222 Esophageal obstruction: Secondary | ICD-10-CM

## 2016-04-23 DIAGNOSIS — Z860100 Personal history of colon polyps, unspecified: Secondary | ICD-10-CM

## 2016-04-23 DIAGNOSIS — R1310 Dysphagia, unspecified: Secondary | ICD-10-CM

## 2016-04-23 DIAGNOSIS — K219 Gastro-esophageal reflux disease without esophagitis: Secondary | ICD-10-CM | POA: Diagnosis not present

## 2016-04-23 HISTORY — PX: COLONOSCOPY: SHX174

## 2016-04-23 HISTORY — PX: UPPER GASTROINTESTINAL ENDOSCOPY: SHX188

## 2016-04-23 MED ORDER — SODIUM CHLORIDE 0.9 % IV SOLN: 500.0000 mL | INTRAVENOUS | Status: DC

## 2016-04-23 NOTE — Progress Notes (Signed)
Report given to PACU RN, vss 

## 2016-04-23 NOTE — Op Note (Signed)
[facility] Patient Name: Todd Spears Procedure Date: 04/23/2016 7:57 AM MRN: [id number] Endoscopist: Docia Chuck. Henrene Pastor , MD Age: 70 Referring MD:  Date of Birth: Apr 20, 1946 Gender: Male Account #: [id number] Procedure:                Upper GI endoscopy with [facility] dilation 2 F Indications:              Dysphagia Medicines:                Monitored Anesthesia Care Procedure:                Pre-Anesthesia Assessment:                           - Prior to the procedure, a History and Physical                            was performed, and patient medications and                            allergies were reviewed. The patient's tolerance of                            previous anesthesia was also reviewed. The risks                            and benefits of the procedure and the sedation                            options and risks were discussed with the patient.                            All questions were answered, and informed consent                            was obtained. Prior Anticoagulants: The patient has                            taken no previous anticoagulant or antiplatelet                            agents. ASA Grade Assessment: II - A patient with                            mild systemic disease. After reviewing the risks                            and benefits, the patient was deemed in                            satisfactory condition to undergo the procedure.                           After obtaining informed consent, the endoscope was  passed under direct vision. Throughout the                            procedure, the patient's blood pressure, pulse, and                            oxygen saturations were monitored continuously. The                            Model GIF-HQ190 [id number]) scope was introduced                            through the mouth, and advanced to the second part                            of duodenum. The  upper GI endoscopy was                            accomplished without difficulty. The patient                            tolerated the procedure well. Scope In: Scope Out: Findings:                 One moderate (circumferential scarring or stenosis;                            an endoscope may pass) benign-appearing, intrinsic                            stenosis was found 37 cm from the incisors. This                            measured 1.4 cm (inner diameter). The scope was                            withdrawn. Dilation was performed with a Maloney                            dilator with no resistance at 57 Fr. No resistance                            or heme. Tolerated well                           The exam of the esophagus was otherwise normal.                           The stomach was normal. Small hiatal hernia .                           The examined duodenum was normal.                           The cardia and  gastric fundus were normal on                            retroflexion. Complications:            No immediate complications. Estimated Blood Loss:     Estimated blood loss: none. Impression:               - Benign-appearing esophageal stenosis. Dilated.                           - Normal stomach.                           - Normal examined duodenum.                           - No specimens collected. Recommendation:           - Patient has a contact number available for                            emergencies. The signs and symptoms of potential                            delayed complications were discussed with the                            patient. Return to normal activities tomorrow.                            Written discharge instructions were provided to the                            patient.                           - Post dilation diet.                           - Continue present medications. Docia Chuck. Henrene Pastor, MD 04/23/2016 9:10:56 AM This report has been  signed electronically.

## 2016-04-23 NOTE — Op Note (Signed)
[facility] Patient Name: Todd Spears Procedure Date: 04/23/2016 7:57 AM MRN: [id number] Endoscopist: Docia Chuck. Henrene Pastor , MD Age: 70 Referring MD:  Date of Birth: 02-Aug-1945 Gender: Male Account #: [id number] Procedure:                Colonoscopy Indications:              High risk colon cancer surveillance: Personal                            history of non-advanced adenoma. Prior exams 2006,                            2012 Medicines:                Monitored Anesthesia Care Procedure:                Pre-Anesthesia Assessment:                           - Prior to the procedure, a History and Physical                            was performed, and patient medications and                            allergies were reviewed. The patient's tolerance of                            previous anesthesia was also reviewed. The risks                            and benefits of the procedure and the sedation                            options and risks were discussed with the patient.                            All questions were answered, and informed consent                            was obtained. Prior Anticoagulants: The patient has                            taken no previous anticoagulant or antiplatelet                            agents. ASA Grade Assessment: II - A patient with                            mild systemic disease. After reviewing the risks                            and benefits, the patient was deemed in  satisfactory condition to undergo the procedure.                           After obtaining informed consent, the colonoscope                            was passed under direct vision. Throughout the                            procedure, the patient's blood pressure, pulse, and                            oxygen saturations were monitored continuously. The                            Model CF-HQ190L [id number]) scope was introduced                  through the anus and advanced to the the cecum,                            identified by appendiceal orifice and ileocecal                            valve. The ileocecal valve, appendiceal orifice,                            and rectum were photographed. The quality of the                            bowel preparation was excellent. The colonoscopy                            was performed without difficulty. The patient                            tolerated the procedure well. The bowel preparation                            used was SUPREP. Scope In: 8:37:29 AM Scope Out: 8:49:13 AM Scope Withdrawal Time: 0 hours 9 minutes 6 seconds  Total Procedure Duration: 0 hours 11 minutes 44 seconds  Findings:                 Multiple medium-mouthed diverticula were found in                            the left colon.                           Internal hemorrhoids were found during retroflexion.                           The exam was otherwise without abnormality on  direct and retroflexion views. Complications:            No immediate complications. Estimated blood loss:                            None. Estimated Blood Loss:     Estimated blood loss: none. Impression:               - Diverticulosis in the left colon.                           - Internal hemorrhoids.                           - The examination was otherwise normal on direct                            and retroflexion views.                           - No specimens collected. Recommendation:           - Repeat colonoscopy in 5 years for surveillance.                           - Patient has a contact number available for                            emergencies. The signs and symptoms of potential                            delayed complications were discussed with the                            patient. Return to normal activities tomorrow.                            Written discharge  instructions were provided to the                            patient.                           - Resume previous diet.                           - Continue present medications. Docia Chuck. Henrene Pastor, MD 04/23/2016 9:06:30 AM This report has been signed electronically.

## 2016-04-23 NOTE — Patient Instructions (Addendum)
YOU HAD AN ENDOSCOPIC PROCEDURE TODAY AT [facility]:   Refer to the procedure report that was given to you for any specific questions about what was found during the examination.  If the procedure report does not answer your questions, please call your gastroenterologist to clarify.  If you requested that your care partner not be given the details of your procedure findings, then the procedure report has been included in a sealed envelope for you to review at your convenience later.  YOU SHOULD EXPECT: Some feelings of bloating in the abdomen. Passage of more gas than usual.  Walking can help get rid of the air that was put into your GI tract during the procedure and reduce the bloating. If you had a lower endoscopy (such as a colonoscopy or flexible sigmoidoscopy) you may notice spotting of blood in your stool or on the toilet paper. If you underwent a bowel prep for your procedure, you may not have a normal bowel movement for a few days.  Please Note:  You might notice some irritation and congestion in your nose or some drainage.  This is from the oxygen used during your procedure.  There is no need for concern and it should clear up in a day or so.  SYMPTOMS TO REPORT IMMEDIATELY:   Following lower endoscopy (colonoscopy or flexible sigmoidoscopy):  Excessive amounts of blood in the stool  Significant tenderness or worsening of abdominal pains  Swelling of the abdomen that is new, acute  Fever of 100F or higher   Following upper endoscopy (EGD)  Vomiting of blood or coffee ground material  New chest pain or pain under the shoulder blades  Painful or persistently difficult swallowing  New shortness of breath  Fever of 100F or higher  Black, tarry-looking stools  For urgent or emergent issues, a gastroenterologist can be reached at any hour by calling [phone].   DIET:  Please follow the dilatation diet the rest of today.  A handout was provided.  Drink plenty of  fluids but you should avoid alcoholic beverages for 24 hours.  ACTIVITY:  You should plan to take it easy for the rest of today and you should NOT DRIVE or use heavy machinery until tomorrow (because of the sedation medicines used during the test).    FOLLOW UP: Our staff will call the number listed on your records the next business day following your procedure to check on you and address any questions or concerns that you may have regarding the information given to you following your procedure. If we do not reach you, we will leave a message.  However, if you are feeling well and you are not experiencing any problems, there is no need to return our call.  We will assume that you have returned to your regular daily activities without incident.  If any biopsies were taken you will be contacted by phone or by letter within the next 1-3 weeks.  Please call us at [phone] if you have not heard about the biopsies in 3 weeks.    SIGNATURES/CONFIDENTIALITY: You and/or your care partner have signed paperwork which will be entered into your electronic medical record.  These signatures attest to the fact that that the information above on your After Visit Summary has been reviewed and is understood.  Full responsibility of the confidentiality of this discharge information lies with you and/or your care-partner.   Handouts were given to your care partner on the a hiatal hernia, dilatation  diet handout, diverticulosis, hemorrhoids, and a high fiber diet with liberal fluid intake. You may resume your current medications today. Please follow the dilatation diet handout the rest of today. Repeat colonoscopy in 5 years for surveillance. Please call if any questions or concerns.

## 2016-04-23 NOTE — Progress Notes (Signed)
Called to room to assist during endoscopic procedure.  Patient ID and intended procedure confirmed with present staff. Received instructions for my participation in the procedure from the performing physician.  

## 2016-04-23 NOTE — Progress Notes (Signed)
No problems noted in the recovery room. maw 

## 2016-04-24 ENCOUNTER — Telehealth: Payer: Self-pay | Admitting: *Deleted

## 2016-04-24 NOTE — Telephone Encounter (Signed)
  Follow up Call-  Call back number 04/23/2016  Post procedure Call Back phone  # [phone]  Permission to leave phone message Yes  Some recent data might be hidden     Patient questions:  Do you have a fever, pain , or abdominal swelling? No. Pain Score  0 *  Have you tolerated food without any problems? Yes.    Have you been able to return to your normal activities? Yes.    Do you have any questions about your discharge instructions: Diet   No. Medications  No. Follow up visit  No.  Do you have questions or concerns about your Care? No.  Actions: * If pain score is 4 or above: No action needed, pain <4.

## 2016-05-01 ENCOUNTER — Ambulatory Visit: Payer: BLUE CROSS/BLUE SHIELD | Admitting: Podiatry

## 2016-05-02 ENCOUNTER — Ambulatory Visit: Payer: BLUE CROSS/BLUE SHIELD

## 2016-05-17 ENCOUNTER — Other Ambulatory Visit: Payer: Self-pay | Admitting: Family Medicine

## 2016-05-29 ENCOUNTER — Ambulatory Visit (INDEPENDENT_AMBULATORY_CARE_PROVIDER_SITE_OTHER): Payer: BLUE CROSS/BLUE SHIELD | Admitting: Podiatry

## 2016-05-29 ENCOUNTER — Encounter: Payer: Self-pay | Admitting: Podiatry

## 2016-05-29 VITALS — Ht 67.0 in | Wt 222.0 lb

## 2016-05-29 DIAGNOSIS — M216X9 Other acquired deformities of unspecified foot: Secondary | ICD-10-CM

## 2016-05-29 DIAGNOSIS — B351 Tinea unguium: Secondary | ICD-10-CM | POA: Diagnosis not present

## 2016-05-29 DIAGNOSIS — Q828 Other specified congenital malformations of skin: Secondary | ICD-10-CM

## 2016-05-29 DIAGNOSIS — M79676 Pain in unspecified toe(s): Secondary | ICD-10-CM

## 2016-05-29 NOTE — Progress Notes (Signed)
Patient ID: Todd Spears, male   DOB: 27-Aug-1945, 70 y.o.   MRN: [id number] Complaint:  Visit Type: Patient returns to my office for continued preventative foot care services. Complaint: Patient states" my nails have grown long and thick and become painful to walk and wear shoes". The patient presents for preventative foot care services. No changes to ROS.  Painful callus under the outside ball of both feet.  Podiatric Exam: Vascular: dorsalis pedis and posterior tibial pulses are palpable bilateral. Capillary return is immediate. Temperature gradient is WNL. Skin turgor WNL  Sensorium: Normal Semmes Weinstein monofilament test. Normal tactile sensation bilaterally. Nail Exam: Pt has thick disfigured discolored nails with subungual debris noted bilateral entire nail hallux through fifth toenails Ulcer Exam: There is no evidence of ulcer or pre-ulcerative changes or infection. Orthopedic Exam: Muscle tone and strength are WNL. No limitations in general ROM. No crepitus or effusions noted. Foot type and digits show no abnormalities. Bony prominences are unremarkable. Skin:  Porokeratosis sub 5th metatarsal head both feet.. No infection or ulcers  Diagnosis:  Onychomycosis, , Pain in right toe, pain in left toes. Porokeratosis B/L  Treatment & Plan Procedures and Treatment: Consent by patient was obtained for treatment procedures. The patient understood the discussion of treatment and procedures well. All questions were answered thoroughly reviewed. Debridement of mycotic and hypertrophic toenails, 1 through 5 bilateral and clearing of subungual debris. No ulceration, no infection noted. Debride porokeratosis Return Visit-Office Procedure: Patient instructed to return to the office for a follow up visit 3 months for continued evaluation and treatment.  Gardiner Barefoot DPM

## 2016-05-30 ENCOUNTER — Ambulatory Visit (INDEPENDENT_AMBULATORY_CARE_PROVIDER_SITE_OTHER): Payer: BLUE CROSS/BLUE SHIELD | Admitting: Pharmacist

## 2016-05-30 ENCOUNTER — Encounter: Payer: Self-pay | Admitting: Pharmacist

## 2016-05-30 DIAGNOSIS — E7800 Pure hypercholesterolemia, unspecified: Secondary | ICD-10-CM

## 2016-05-30 MED ORDER — PRAVASTATIN SODIUM 10 MG PO TABS: 10.0000 mg | ORAL_TABLET | Freq: Every day | ORAL | 1 refills | Status: DC

## 2016-05-30 NOTE — Patient Instructions (Signed)
START taking pravastatin 10mg  daily   Call [phone] if you develop any side effects.   We will recheck lipid panel in 2-3 months    Cholesterol Cholesterol is a fat. Your body needs a small amount of cholesterol. Cholesterol (plaque) may build up in your blood vessels (arteries). That makes you more likely to have a heart attack or stroke. You cannot feel your cholesterol level. Having a blood test is the only way to find out if your level is high. Keep your test results. Work with your doctor to keep your cholesterol at a good level. What do the results mean?  Total cholesterol is how much cholesterol is in your blood.  LDL is bad cholesterol. This is the type that can build up. Try to have low LDL.  HDL is good cholesterol. It cleans your blood vessels and carries LDL away. Try to have high HDL.  Triglycerides are fat that the body can store or burn for energy. What are good levels of cholesterol?  Total cholesterol below 200.  LDL below 100 is good for people who have health risks. LDL below 70 is good for people who have very high risks.  HDL above 40 is good. It is best to have HDL of 60 or higher.  Triglycerides below 150. How can I lower my cholesterol? Diet  Follow your diet program as told by your doctor.  Choose fish, white meat chicken, or [place] that is roasted or baked. Try not to eat red meat, fried foods, sausage, or lunch meats.  Eat lots of fresh fruits and vegetables.  Choose whole grains, beans, pasta, potatoes, and cereals.  Choose olive oil, corn oil, or canola oil. Only use small amounts.  Try not to eat butter, mayonnaise, shortening, or palm kernel oils.  Try not to eat foods with trans fats.  Choose low-fat or nonfat dairy foods.  Drink skim or nonfat milk.  Eat low-fat or nonfat yogurt and cheeses.  Try not to drink whole milk or cream.  Try not to eat ice cream, egg yolks, or full-fat cheeses.  Healthy desserts include angel food  cake, ginger snaps, animal crackers, hard candy, popsicles, and low-fat or nonfat frozen yogurt. Try not to eat pastries, cakes, pies, and cookies. Exercise  Follow your exercise program as told by your doctor.  Be more active. Try gardening, walking, and taking the stairs.  Ask your doctor about ways that you can be more active. Medicine  Take over-the-counter and prescription medicines only as told by your doctor. This information is not intended to replace advice given to you by your health care provider. Make sure you discuss any questions you have with your health care provider. Document Released: 08/16/2008 Document Revised: 12/20/2015 Document Reviewed: 11/30/2015 Elsevier Interactive Patient Education  2017 [employer].

## 2016-05-30 NOTE — Progress Notes (Signed)
Patient ID: Todd Spears                 DOB: 11-21-1945                    MRN: [id number]     HPI: Todd Spears is a 69 y.o. male patient of Dr. Martinique with PMH below that presents today for lipid evaluation.  He reports that he has been intolerant of statins and most recently Crestor 3 times a week. He reports he took 3 pills and then discontinued as he developed the muscle aching. He states he is not interested in pursuing injectable medications as he is not sure he would be able to give the injection. He also states he would rather try all the statins before going into drug trial.   PMH: HTN, CAD, aortic stenosis, HLD LDL Goal: <70  Current Medications:  none Intolerances: Crestor 5mg  three times a week, Zetia 10mg  daily, Zocor, Lipitor - all of which caused severe muscle cramps that would wake him at night. Each time the cramps subsided about 1-2 weeks after discontinuation of the drug.   Diet: He eats about half of his meals from restaurants. He does try to avoid greasy and fried foods. He makes sure to get his vegetables.   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 He is somewhat limited due to a recent hernia surgery.   Family History: His father passed away at 60 yo due to a heart attack and he believes he had high cholesterol as well.   Social History: He quit smoking in 1975 after smoking since he was 70yo. He denies smokeless tobacco and alcohol.   Labs:  Past Medical History:  Diagnosis Date  . Allergic rhinitis   . Anemia   . Asthma   . CAD (coronary artery disease)    a. s/p PTCA/stenting of the mid LAD and mid RCA 04/06/12  . Carotid bruit    r  . Colon polyp   . Diverticulosis   . ED (erectile dysfunction)   . Fatty liver   . GERD (gastroesophageal reflux disease)   . Gout   . Hyperlipidemia   . Hypertension   . Obesity   . Osteoarthritis   . Renal insufficiency   . Restless leg syndrome     Current Outpatient Prescriptions on File Prior  to Visit  Medication Sig Dispense Refill  . albuterol (PROVENTIL HFA;VENTOLIN HFA) 108 (90 BASE) MCG/ACT inhaler Inhale 2 puffs into the lungs every 6 (six) hours as needed. For shortness of breath 2 Inhaler 1  . amLODipine (NORVASC) 10 MG tablet TAKE 1 TABLET DAILY 100 tablet 2  . aspirin 81 MG chewable tablet Chew 81 mg by mouth daily.    . cetirizine (ZYRTEC) 10 MG tablet Take 10 mg by mouth every morning.    Marland Kitchen COLCRYS 0.6 MG tablet Take 0.6 mg by mouth daily as needed (for gout flareups). Reported on 10/26/2015    . Cyanocobalamin (VITAMIN B 12 PO) Take 1 tablet by mouth daily.     Marland Kitchen esomeprazole (NEXIUM) 40 MG capsule TAKE 1 CAPSULE DAILY BEFORE BREAKFAST 100 capsule 2  . FeFum-FePoly-FA-B Cmp-C-Biot (INTEGRA PLUS) CAPS Take 1 tablet by mouth daily.    . hydrALAZINE (APRESOLINE) 50 MG tablet Take 1 tablet by mouth 3 (three) times daily.    Marland Kitchen lisinopril (PRINIVIL,ZESTRIL) 20 MG tablet TAKE 1 TABLET TWICE A DAY 200 tablet 2  . loratadine (CLARITIN)  10 MG tablet Take 10 mg by mouth at bedtime.     Marland Kitchen losartan (COZAAR) 50 MG tablet Take 50 mg by mouth daily.    . metoprolol succinate (TOPROL-XL) 50 MG 24 hr tablet TAKE 1 TABLET DAILY WITH OR IMMEDIATELY FOLLOWING A MEAL 100 tablet 2  . mometasone (NASONEX) 50 MCG/ACT nasal spray Place 1-2 sprays into the nose as needed.    . montelukast (SINGULAIR) 10 MG tablet TAKE 1 TABLET (10 MG TOTAL) BY MOUTH AT BEDTIME. 30 tablet 3  . Multiple Vitamin (MULTIVITAMIN WITH MINERALS) TABS tablet Take 1 tablet by mouth daily.    . Multiple Vitamins-Minerals (VISION-VITE PRESERVE PO) Take 1 tablet by mouth 2 (two) times daily.     . nitroGLYCERIN (NITROSTAT) 0.4 MG SL tablet Place 1 tablet (0.4 mg total) under the tongue every 5 (five) minutes as needed for chest pain (up to 3 doses. not within 24hrs of Viagra). 25 tablet 6  . oxyCODONE-acetaminophen (PERCOCET) 10-325 MG tablet Take 1 tablet by mouth every 6 (six) hours as needed for pain. 20 tablet 0  .  rosuvastatin (CRESTOR) 5 MG tablet Take 1 tablet (5 mg total) by mouth at bedtime. Take twice a week 90 tablet 3  . Saline (SIMPLY SALINE) 0.9 % AERS Place 1 spray into the nose daily as needed (congestion).     . traMADol (ULTRAM) 50 MG tablet TAKE 1 TABLET EVERY 12 HOURS 120 tablet 4  . ULORIC 80 MG TABS Take 1 tablet by mouth daily. Reported on 10/26/2015     Current Facility-Administered Medications on File Prior to Visit  Medication Dose Route Frequency Provider Last Rate Last Dose  . 0.9 %  sodium chloride infusion  500 mL Intravenous Continuous Irene Shipper, MD        Allergies  Allergen Reactions  . Prednisone Shortness Of Breath and Swelling  . Crestor [Rosuvastatin Calcium] Other (See Comments)    myalgia  . Lyrica [Pregabalin]     "swelling"    Assessment/Plan: Hyperlipidemia: LDL is not at goal. He has been intolerant to statin medications in the past, but willing to rechallenge as he is uncomfortable with injectable medications and not interested in a clinical trial at this time. Will start pravastatin 10mg  daily and recheck lipids in 2 months.    Thank you,  Lelan Pons. Patterson Hammersmith, [facility]  05/30/2016 9:30 AM

## 2016-07-18 ENCOUNTER — Other Ambulatory Visit: Payer: Self-pay | Admitting: Adult Health

## 2016-07-18 NOTE — Telephone Encounter (Signed)
Okay to refill for 1 year. 

## 2016-07-20 ENCOUNTER — Other Ambulatory Visit: Payer: Self-pay | Admitting: Cardiology

## 2016-07-23 NOTE — Telephone Encounter (Signed)
Rx(s) sent to pharmacy electronically.  

## 2016-07-24 ENCOUNTER — Telehealth: Payer: Self-pay | Admitting: Cardiology

## 2016-07-24 ENCOUNTER — Ambulatory Visit (INDEPENDENT_AMBULATORY_CARE_PROVIDER_SITE_OTHER): Payer: BLUE CROSS/BLUE SHIELD | Admitting: Family Medicine

## 2016-07-24 ENCOUNTER — Encounter: Payer: Self-pay | Admitting: Family Medicine

## 2016-07-24 VITALS — BP 168/68 | HR 67 | Temp 98.1°F | Ht 67.0 in | Wt 228.0 lb

## 2016-07-24 DIAGNOSIS — I10 Essential (primary) hypertension: Secondary | ICD-10-CM

## 2016-07-24 DIAGNOSIS — R42 Dizziness and giddiness: Secondary | ICD-10-CM | POA: Diagnosis not present

## 2016-07-24 NOTE — Patient Instructions (Addendum)
Dizziness Dizziness is a common problem. It is a feeling of unsteadiness or light-headedness. You may feel like you are about to faint. Dizziness can lead to injury if you stumble or fall. Anyone can become dizzy, but dizziness is more common in older adults. This condition can be caused by a number of things, including medicines, dehydration, or illness. Follow these instructions at home: Taking these steps may help with your condition: Eating and drinking  Drink enough fluid to keep your urine clear or pale yellow. This helps to keep you from becoming dehydrated. Try to drink more clear fluids, such as water.  Do not drink alcohol.  Limit your caffeine intake if directed by your health care provider.  Limit your salt intake if directed by your health care provider. Activity  Avoid making quick movements.  Rise slowly from chairs and steady yourself until you feel okay.  In the morning, first sit up on the side of the bed. When you feel okay, stand slowly while you hold onto something until you know that your balance is fine.  Move your legs often if you need to stand in one place for a long time. Tighten and relax your muscles in your legs while you are standing.  Do not drive or operate heavy machinery if you feel dizzy.  Avoid bending down if you feel dizzy. Place items in your home so that they are easy for you to reach without leaning over. Lifestyle  Do not use any tobacco products, including cigarettes, chewing tobacco, or electronic cigarettes. If you need help quitting, ask your health care provider.  Try to reduce your stress level, such as with yoga or meditation. Talk with your health care provider if you need help. General instructions  Watch your dizziness for any changes.  Take medicines only as directed by your health care provider. Talk with your health care provider if you think that your dizziness is caused by a medicine that you are taking.  Tell a friend or  a family member that you are feeling dizzy. If he or she notices any changes in your behavior, have this person call your health care provider.  Keep all follow-up visits as directed by your health care provider. This is important. Contact a health care provider if:  Your dizziness does not go away.  Your dizziness or light-headedness gets worse.  You feel nauseous.  You have reduced hearing.  You have new symptoms.  You are unsteady on your feet or you feel like the room is spinning. Get help right away if:  You vomit or have diarrhea and are unable to eat or drink anything.  You have problems talking, walking, swallowing, or using your arms, hands, or legs.  You feel generally weak.  You are not thinking clearly or you have trouble forming sentences. It may take a friend or family member to notice this.  You have chest pain, abdominal pain, shortness of breath, or sweating.  Your vision changes.  You notice any bleeding.  You have a headache.  You have neck pain or a stiff neck.  You have a fever. This information is not intended to replace advice given to you by your health care provider. Make sure you discuss any questions you have with your health care provider. Document Released: 11/13/2000 Document Revised: 10/26/2015 Document Reviewed: 05/16/2014 Elsevier Interactive Patient Education  2017 [employer].  Monitor blood pressure over the next few days and follow up with primary if consistently > 140/90

## 2016-07-24 NOTE — Telephone Encounter (Signed)
Pt want to speak to [facility] and wouldn't say why.

## 2016-07-24 NOTE — Progress Notes (Signed)
Pre visit review using our clinic review tool, if applicable. No additional management support is needed unless otherwise documented below in the visit note. 

## 2016-07-24 NOTE — Progress Notes (Signed)
Subjective:     Patient ID: Todd Spears, male   DOB: 1945/08/09, 71 y.o.   MRN: [id number]  HPI Patient seen with 2 separate episodes of vertigo. First episode occurred couple months ago and was very transient. Yesterday he noted transient vertigo which lasted only a few minutes. He had some mild headache. Denied any hearing loss, speech changes, visual changes, ataxia, focal weakness, dyspnea, or any chest pain. His vertigo did not seem to have any exacerbating or alleviating factors and only lasted a few minutes. This did not seem to be related to position of the head. He denied any associated nausea or vomiting. No recent upper respiratory symptoms.  He has history of CAD and hypertension. Blood pressures been somewhat poorly controlled recently. He had blood pressure reading at home yesterday 185/85. He takes several medications including amlodipine, lisinopril, metoprolol, and hydralazine. He's had problems with previous hyponatremia with chlorthalidone.  Past Medical History:  Diagnosis Date  . Allergic rhinitis   . Anemia   . Asthma   . CAD (coronary artery disease)    a. s/p PTCA/stenting of the mid LAD and mid RCA 04/06/12  . Carotid bruit    r  . Colon polyp   . Diverticulosis   . ED (erectile dysfunction)   . Fatty liver   . GERD (gastroesophageal reflux disease)   . Gout   . Hyperlipidemia   . Hypertension   . Obesity   . Osteoarthritis   . Renal insufficiency   . Restless leg syndrome    Past Surgical History:  Procedure Laterality Date  . COLONOSCOPY    . INGUINAL HERNIA REPAIR Right 02/27/2016   Procedure: HERNIA REPAIR INGUINAL ADULT;  Surgeon: Rolm Bookbinder, MD;  Location: [facility];  Service: General;  Laterality: Right;  . INSERTION OF MESH Right 02/27/2016   Procedure: INSERTION OF MESH--IN AND OUT CATHETERIZATION PERFORMED AT END OF PROCEDURE TO DRAIN 400ML URINE.;  Surgeon: Rolm Bookbinder, MD;  Location: [facility];   Service: General;  Laterality: Right;  . NM PET LYMPHOMA     left side of neck 1999  . PERCUTANEOUS CORONARY STENT INTERVENTION (PCI-S) N/A 04/06/2012   Procedure: PERCUTANEOUS CORONARY STENT INTERVENTION (PCI-S);  Surgeon: Peter M Martinique, MD; Promus DES for 90% mid LAD and 95% mid RCA disease      reports that he quit smoking about 41 years ago. He has never used smokeless tobacco. He reports that he does not drink alcohol or use drugs. family history includes Coronary artery disease in his father; Glaucoma in his mother; Heart attack in his father; Heart disease in his father; Hypertension in his mother, paternal grandfather, and paternal grandmother; Stomach cancer in his mother; Stroke in his mother. Allergies  Allergen Reactions  . Prednisone Shortness Of Breath and Swelling  . Crestor [Rosuvastatin Calcium] Other (See Comments)    myalgia  . Lyrica [Pregabalin]     "swelling"     Review of Systems  Constitutional: Negative for chills, fatigue and fever.  Eyes: Negative for visual disturbance.  Respiratory: Negative for shortness of breath.   Cardiovascular: Negative for chest pain.  Gastrointestinal: Negative for abdominal pain.  Neurological: Positive for dizziness. Negative for seizures, syncope, speech difficulty, weakness and numbness.  Psychiatric/Behavioral: Negative for confusion.       Objective:   Physical Exam  Constitutional: He is oriented to person, place, and time. He appears well-developed and well-nourished. No distress.  HENT:  Mouth/Throat: Oropharynx is clear and  moist.  Eyes: EOM are normal. Pupils are equal, round, and reactive to light.  Neck: Neck supple.  Cardiovascular: Normal rate and regular rhythm.   Pulmonary/Chest: Effort normal and breath sounds normal. No respiratory distress. He has no wheezes. He has no rales.  Musculoskeletal: He exhibits no edema.  Neurological: He is alert and oriented to person, place, and time. No cranial nerve  deficit. Coordination normal.  He has mild upper extremity tremor which apparently is chronic. No focal weakness. Cerebellar function normal by finger-nose testing. Gait normal. We could not reproduce any vertigo with lying supine with head to the right or left side. No nystagmus.  Psychiatric: He has a normal mood and affect. His behavior is normal. Judgment and thought content normal.       Assessment:     #1 transient vertigo. Symptoms currently resolved.  ?BPPV but not reproducible on exam. Doubt vertebrobasilar insufficiency  #2 hypertension with elevated readings past couple days with good compliance on 4 drug regimen and previous intolerance with chlorthalidone which caused hyponatremia     Plan:     -Monitor blood pressure closely over the next few days and be in touch with primary if consistently greater than 140/90  -Handout on vertigo given. Follow-up immediately for any speech changes, ataxia, focal weakness, confusion, or any other focal neurologic findings   Eulas Post MD [facility]

## 2016-07-24 NOTE — Telephone Encounter (Signed)
Returned call to patient no answer.LMTC. 

## 2016-07-25 ENCOUNTER — Encounter: Payer: Self-pay | Admitting: Adult Health

## 2016-07-25 ENCOUNTER — Ambulatory Visit (INDEPENDENT_AMBULATORY_CARE_PROVIDER_SITE_OTHER): Payer: BLUE CROSS/BLUE SHIELD | Admitting: Adult Health

## 2016-07-25 ENCOUNTER — Ambulatory Visit: Payer: BLUE CROSS/BLUE SHIELD | Admitting: Adult Health

## 2016-07-25 VITALS — BP 145/78 | Temp 98.1°F | Ht 67.0 in | Wt 228.6 lb

## 2016-07-25 DIAGNOSIS — I10 Essential (primary) hypertension: Secondary | ICD-10-CM | POA: Diagnosis not present

## 2016-07-25 NOTE — Progress Notes (Signed)
Subjective:    Patient ID: Todd Spears, male    DOB: Jul 02, 1945, 71 y.o.   MRN: [id number]  HPI  71 year old male who  has a past medical history of Allergic rhinitis; Anemia; Asthma; CAD (coronary artery disease); Carotid bruit; Colon polyp; Diverticulosis; ED (erectile dysfunction); Fatty liver; GERD (gastroesophageal reflux disease); Gout; Hyperlipidemia; Hypertension; Obesity; Osteoarthritis; Renal insufficiency; and Restless leg syndrome.  After seeing Dr. Elease Hashimoto on 07/24/2016 for 2 separate episodes of vertigo  At that time he also endorses blood sugars have been not controlled as of lately, with the reading at home of 185/85. Aches several medications including amlodipine, lisinopril, metoprolol, and hydralazine. When he took chlorthalidone he had problems with hyponatremia  BP Readings from Last 3 Encounters:  07/25/16 (!) 145/78  07/24/16 (!) 168/68  04/23/16 104/65   Today in the office he reports that he has not had any similar episodes of vertigo. His blood pressures at home have been running in the [id number] to [id number] systolic range. He understands that pressure reason for his elevated blood pressure is been increased weight gain as he had had hernia surgery in December and has not been active since.  He denies any headaches, blurred vision, syncopal episodes, or dizziness  Review of Systems  Constitutional: Negative.   HENT: Negative.   Respiratory: Negative.   Cardiovascular: Negative.   Neurological: Negative.   All other systems reviewed and are negative.  Past Medical History:  Diagnosis Date  . Allergic rhinitis   . Anemia   . Asthma   . CAD (coronary artery disease)    a. s/p PTCA/stenting of the mid LAD and mid RCA 04/06/12  . Carotid bruit    r  . Colon polyp   . Diverticulosis   . ED (erectile dysfunction)   . Fatty liver   . GERD (gastroesophageal reflux disease)   . Gout   . Hyperlipidemia   . Hypertension   . Obesity   . Osteoarthritis   . Renal  insufficiency   . Restless leg syndrome     Social History   Social History  . Marital status: Married    Spouse name: N/A  . Number of children: 1  . Years of education: N/A   Occupational History  .  [employer] History Main Topics  . Smoking status: Former Smoker    Quit date: 06/04/1975  . Smokeless tobacco: Never Used  . Alcohol use No  . Drug use: No  . Sexual activity: Not Currently   Other Topics Concern  . Not on file   Social History Narrative   Works as a Engineer, building services    Married   One son ( lives in [place])    Regular exercise- no    Past Surgical History:  Procedure Laterality Date  . COLONOSCOPY    . INGUINAL HERNIA REPAIR Right 02/27/2016   Procedure: HERNIA REPAIR INGUINAL ADULT;  Surgeon: Rolm Bookbinder, MD;  Location: [place];  Service: General;  Laterality: Right;  . INSERTION OF MESH Right 02/27/2016   Procedure: INSERTION OF MESH--IN AND OUT CATHETERIZATION PERFORMED AT END OF PROCEDURE TO DRAIN 400ML URINE.;  Surgeon: Rolm Bookbinder, MD;  Location: [place];  Service: General;  Laterality: Right;  . NM PET LYMPHOMA     left side of neck 1999  . PERCUTANEOUS CORONARY STENT INTERVENTION (PCI-S) N/A 04/06/2012   Procedure: PERCUTANEOUS CORONARY STENT INTERVENTION (PCI-S);  Surgeon: Peter M Martinique,  MD; Promus DES for 90% mid LAD and 95% mid RCA disease      Family History  Problem Relation Age of Onset  . Coronary artery disease Father     in his 41s  . Heart disease Father   . Heart attack Father   . Hypertension Mother   . Stroke Mother     Several   . Stomach cancer Mother   . Glaucoma Mother   . Hypertension Paternal Grandmother   . Hypertension Paternal Grandfather   . Colon cancer Neg Hx   . Esophageal cancer Neg Hx   . Rectal cancer Neg Hx     Allergies  Allergen Reactions  . Prednisone Shortness Of Breath and Swelling  . Crestor [Rosuvastatin Calcium] Other (See  Comments)    myalgia  . Lyrica [Pregabalin]     "swelling"    Current Outpatient Prescriptions on File Prior to Visit  Medication Sig Dispense Refill  . albuterol (PROVENTIL HFA;VENTOLIN HFA) 108 (90 BASE) MCG/ACT inhaler Inhale 2 puffs into the lungs every 6 (six) hours as needed. For shortness of breath 2 Inhaler 1  . amLODipine (NORVASC) 10 MG tablet TAKE 1 TABLET DAILY 100 tablet 2  . aspirin 81 MG chewable tablet Chew 81 mg by mouth daily.    . cetirizine (ZYRTEC) 10 MG tablet Take 10 mg by mouth every morning.    Marland Kitchen COLCRYS 0.6 MG tablet Take 0.6 mg by mouth daily as needed (for gout flareups). Reported on 10/26/2015    . Cyanocobalamin (VITAMIN B 12 PO) Take 1 tablet by mouth daily.     Marland Kitchen esomeprazole (NEXIUM) 40 MG capsule TAKE 1 CAPSULE DAILY BEFORE BREAKFAST 100 capsule 2  . FeFum-FePoly-FA-B Cmp-C-Biot (INTEGRA PLUS) CAPS Take 1 tablet by mouth daily.    . hydrALAZINE (APRESOLINE) 50 MG tablet Take 1 tablet by mouth 3 (three) times daily.    Marland Kitchen lisinopril (PRINIVIL,ZESTRIL) 20 MG tablet TAKE 1 TABLET TWICE A DAY 200 tablet 2  . loratadine (CLARITIN) 10 MG tablet Take 10 mg by mouth at bedtime.     . metoprolol succinate (TOPROL-XL) 50 MG 24 hr tablet TAKE 1 TABLET DAILY WITH OR IMMEDIATELY FOLLOWING A MEAL 100 tablet 2  . mometasone (NASONEX) 50 MCG/ACT nasal spray Place 1-2 sprays into the nose as needed.    . montelukast (SINGULAIR) 10 MG tablet TAKE 1 TABLET BY MOUTH AT BEDTIME 90 tablet 3  . Multiple Vitamin (MULTIVITAMIN WITH MINERALS) TABS tablet Take 1 tablet by mouth daily.    . Multiple Vitamins-Minerals (VISION-VITE PRESERVE PO) Take 1 tablet by mouth 2 (two) times daily.     . nitroGLYCERIN (NITROSTAT) 0.4 MG SL tablet Place 1 tablet (0.4 mg total) under the tongue every 5 (five) minutes as needed for chest pain (up to 3 doses. not within 24hrs of Viagra). 25 tablet 6  . pravastatin (PRAVACHOL) 10 MG tablet Take 1 tablet (10 mg total) by mouth daily. 30 tablet 9  . Saline  (SIMPLY SALINE) 0.9 % AERS Place 1 spray into the nose daily as needed (congestion).     . traMADol (ULTRAM) 50 MG tablet TAKE 1 TABLET EVERY 12 HOURS (Patient taking differently: TAKE 1 TABLET EVERY 12 HOURS As Needed) 120 tablet 4  . ULORIC 80 MG TABS Take 1 tablet by mouth daily. Reported on 10/26/2015     Current Facility-Administered Medications on File Prior to Visit  Medication Dose Route Frequency Provider Last Rate Last Dose  . 0.9 %  sodium  chloride infusion  500 mL Intravenous Continuous Irene Shipper, MD        BP (!) 145/78   Temp 98.1 F (36.7 C) (Oral)   Ht 5\' 7"  (1.702 m)   Wt 228 lb 9.6 oz (103.7 kg)   BMI 35.80 kg/m       Objective:   Physical Exam  Constitutional: He is oriented to person, place, and time. He appears well-developed and well-nourished. No distress.  Cardiovascular: Normal rate, regular rhythm, normal heart sounds and intact distal pulses.  Exam reveals no gallop and no friction rub.   No murmur heard. Pulmonary/Chest: Effort normal and breath sounds normal. No respiratory distress. He has no wheezes. He has no rales. He exhibits no tenderness.  Musculoskeletal: Normal range of motion. He exhibits no edema, tenderness or deformity.  Neurological: He is alert and oriented to person, place, and time.  Skin: Skin is warm and dry. No rash noted. He is not diaphoretic. No erythema. No pallor.  Psychiatric: He has a normal mood and affect. His behavior is normal. Judgment and thought content normal.  Nursing note and vitals reviewed.     Assessment & Plan:  1. Essential hypertension - We will not change any medication at this time. I advised him to work on diet and exercise in order to lose weight. He as usual keep costs and check on his blood pressures at home and report to me if his blood pressures continued to be greater than 150/80.  Dorothyann Peng, NP

## 2016-07-26 NOTE — Telephone Encounter (Signed)
Returned call to patient no answer.LMTC. 

## 2016-07-26 NOTE — Telephone Encounter (Signed)
Received call back from patient he stated he already saw PCP everything taken care of.Stated he was having problems with B/P up and down.Stated B/P is doing good now.Advised to call back if needed.

## 2016-07-30 ENCOUNTER — Encounter (HOSPITAL_COMMUNITY): Payer: BLUE CROSS/BLUE SHIELD

## 2016-08-02 ENCOUNTER — Telehealth: Payer: Self-pay | Admitting: Pharmacist Clinician (PhC)/ Clinical Pharmacy Specialist

## 2016-08-02 NOTE — Telephone Encounter (Signed)
Patient called, has appointment on Tuesday for carotid dopplers, wanted to know if he should have a lipid appointment at same time  Returned call.  He has been on pravastatin 10 mg daily for 2 months now.  Will leave lab orders at front desk for him to repeat lipid, hepatic labs in another 2-3 weeks.  Once we get those results we can call and determine next course of action.  Patient voiced understanding.

## 2016-08-05 ENCOUNTER — Other Ambulatory Visit: Payer: Self-pay | Admitting: Physician Assistant

## 2016-08-05 DIAGNOSIS — I6523 Occlusion and stenosis of bilateral carotid arteries: Secondary | ICD-10-CM

## 2016-08-06 ENCOUNTER — Ambulatory Visit (HOSPITAL_COMMUNITY)
Admission: RE | Admit: 2016-08-06 | Discharge: 2016-08-06 | Disposition: A | Discharge status: Home / Self Care | Payer: BLUE CROSS/BLUE SHIELD | Source: Ambulatory Visit | Attending: Cardiovascular Disease | Admitting: Cardiovascular Disease

## 2016-08-06 DIAGNOSIS — I6523 Occlusion and stenosis of bilateral carotid arteries: Secondary | ICD-10-CM | POA: Insufficient documentation

## 2016-08-07 ENCOUNTER — Telehealth: Payer: Self-pay | Admitting: Pharmacist

## 2016-08-07 NOTE — Telephone Encounter (Signed)
Received a voicemail from pt stating he has a "blood draw" some time tomorrow but does not know the time or location. He stated he was given information yesterday by a [facility] pharmacist, will forward to them to address and follow up with patient.

## 2016-08-07 NOTE — Telephone Encounter (Signed)
LMOM; patient received instructiosn from [place] on 08/02/16 to repeat lipid and hepatic blood work. Order left at front desk for patein to pick up and compete.   Direct number for pharmacist clinic provided for any additional need.

## 2016-08-18 ENCOUNTER — Other Ambulatory Visit: Payer: Self-pay | Admitting: Neurology

## 2016-08-19 LAB — HEPATIC FUNCTION PANEL
ALBUMIN: 4.1 g/dL (ref 3.6–5.1)
ALT: 21 U/L (ref 9–46)
AST: 27 U/L (ref 10–35)
Alkaline Phosphatase: 47 U/L (ref 40–115)
BILIRUBIN DIRECT: 0.1 mg/dL (ref <=0.2)
Indirect Bilirubin: 0.5 mg/dL (ref 0.2–1.2)
Total Bilirubin: 0.6 mg/dL (ref 0.2–1.2)
Total Protein: 6.6 g/dL (ref 6.1–8.1)

## 2016-08-19 LAB — LIPID PANEL
CHOL/HDL RATIO: 3.5 ratio (ref <5.0)
CHOLESTEROL: 189 mg/dL (ref <200)
HDL: 54 mg/dL (ref >40)
LDL Cholesterol: 115 mg/dL — ABNORMAL HIGH (ref <100)
Triglycerides: 99 mg/dL (ref <150)
VLDL: 20 mg/dL (ref <30)

## 2016-08-20 ENCOUNTER — Telehealth: Payer: Self-pay | Admitting: Pharmacist Clinician (PhC)/ Clinical Pharmacy Specialist

## 2016-08-20 NOTE — Telephone Encounter (Signed)
LMOM for patient to call regarding lipid labs.  Some improvement, will have patient increase dose to 10 mg qod/20 mg qod.

## 2016-08-20 NOTE — Telephone Encounter (Signed)
-----   Message from Peter M Martinique, MD sent at 08/19/2016  1:22 PM EDT ----- Cholesterol much better. Followed in lipid clinic. LDL 163>>115. Will forward  Peter Martinique MD, [facility]

## 2016-08-21 ENCOUNTER — Ambulatory Visit: Payer: BLUE CROSS/BLUE SHIELD | Admitting: Podiatry

## 2016-08-21 ENCOUNTER — Other Ambulatory Visit: Payer: Self-pay | Admitting: Neurology

## 2016-08-21 MED ORDER — PRAVASTATIN SODIUM 10 MG PO TABS: ORAL_TABLET | ORAL | 6 refills | Status: DC

## 2016-08-21 NOTE — Telephone Encounter (Signed)
Patient reports no myalgias associated with pravastatin 10 mg.  LDL cholesterol dropped from 163 to 115 (30% drop).  Will have him increase dose to 20 mg qod and 10 mg qod, as we don't want to increase enough to potentially cause myalgias.  If he tolerates this for 3 months, will repeat lipids and increase dose again as tolerated.  Patient agreeable to plan.

## 2016-09-23 ENCOUNTER — Telehealth: Payer: Self-pay | Admitting: Cardiology

## 2016-09-23 MED ORDER — HYDRALAZINE HCL 50 MG PO TABS: 50.0000 mg | ORAL_TABLET | Freq: Three times a day (TID) | ORAL | 0 refills | Status: DC

## 2016-09-23 MED ORDER — HYDRALAZINE HCL 50 MG PO TABS: 50.0000 mg | ORAL_TABLET | Freq: Three times a day (TID) | ORAL | 2 refills | Status: DC

## 2016-09-23 NOTE — Telephone Encounter (Signed)
°*  STAT* If patient is at the pharmacy, call can be transferred to refill team.   1. Which medications need to be refilled? (please list name of each medication and dose if known) hydralazine 50mg  2. Which pharmacy/location (including street and city if local pharmacy) is medication to be sent to? [facility]   3. Do they need a 30 day or 90 day supply? 90   Pt verbalized that he [facility] [place] [facility] the [facility] order arrives

## 2016-09-23 NOTE — Telephone Encounter (Signed)
Rx(s) sent to pharmacy electronically.  

## 2016-09-25 ENCOUNTER — Encounter: Payer: Self-pay | Admitting: Podiatry

## 2016-09-25 ENCOUNTER — Ambulatory Visit (INDEPENDENT_AMBULATORY_CARE_PROVIDER_SITE_OTHER): Payer: BLUE CROSS/BLUE SHIELD | Admitting: Podiatry

## 2016-09-25 DIAGNOSIS — M216X9 Other acquired deformities of unspecified foot: Secondary | ICD-10-CM

## 2016-09-25 DIAGNOSIS — B351 Tinea unguium: Secondary | ICD-10-CM

## 2016-09-25 DIAGNOSIS — Q828 Other specified congenital malformations of skin: Secondary | ICD-10-CM

## 2016-09-25 DIAGNOSIS — M79676 Pain in unspecified toe(s): Secondary | ICD-10-CM

## 2016-09-25 NOTE — Progress Notes (Signed)
Patient ID: Todd Spears, male   DOB: 24-Nov-1945, 71 y.o.   MRN: [id number] Complaint:  Visit Type: Patient returns to my office for continued preventative foot care services. Complaint: Patient states" my nails have grown long and thick and become painful to walk and wear shoes". The patient presents for preventative foot care services. No changes to ROS.  Painful callus under the outside ball of both feet.  Podiatric Exam: Vascular: dorsalis pedis and posterior tibial pulses are palpable bilateral. Capillary return is immediate. Temperature gradient is WNL. Skin turgor WNL  Sensorium: Normal Semmes Weinstein monofilament test. Normal tactile sensation bilaterally. Nail Exam: Pt has thick disfigured discolored nails with subungual debris noted bilateral entire nail hallux through fifth toenails Ulcer Exam: There is no evidence of ulcer or pre-ulcerative changes or infection. Orthopedic Exam: Muscle tone and strength are WNL. No limitations in general ROM. No crepitus or effusions noted. Foot type and digits show no abnormalities. Bony prominences are unremarkable. Skin:  Porokeratosis sub 5th metatarsal head both feet.. No infection or ulcers  Diagnosis:  Onychomycosis, , Pain in right toe, pain in left toes. Porokeratosis B/L  Treatment & Plan Procedures and Treatment: Consent by patient was obtained for treatment procedures. The patient understood the discussion of treatment and procedures well. All questions were answered thoroughly reviewed. Debridement of mycotic and hypertrophic toenails, 1 through 5 bilateral and clearing of subungual debris. No ulceration, no infection noted. Debride porokeratosis Return Visit-Office Procedure: Patient instructed to return to the office for a follow up visit 3 months for continued evaluation and treatment.  Gardiner Barefoot DPM

## 2016-10-12 NOTE — Progress Notes (Signed)
Todd Spears Date of Birth: Feb 18, 1946 Medical Record #[id number]  History of Present Illness: Todd Spears is seen back today for follow up CAD. He is s/p 2 vessel PCI with Promus DES for 90% mid LAD and 95% mid RCA disease on 04/06/2012. His EF is normal at 55%. At that time he really had no significant anginal symptoms but had an abnormal stress test.  Myoview in Nov. 2014 showed a fixed inferior defect without ischemia. Last Myoview in Feb. 2017 was unchanged. Other issues include CKD stage 3, HTN, obesity, gout,,OA, ED, carotid bruit and asthma.   He was admitted in January 2017 with tremors and hyponatremia. Chlorthalidone was discontinued. He is feeling better.  He had carotid dopplers showing 1-39% RICA and 09-23% LICA stenoses. Echo done in March 2017 showed normal LV function and mild AS unchanged from 2013. He has been followed in our lipid clinic for hypercholesterolemia with prior intolerance to statins. He is now on pravastatin 10 mg alternating with 20 mg daily. He is tolerating this well with so far good response.   On follow up today he denies any chest pain or SOB. No TIA or CVA symptoms. 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 Reports that panendoscopy last year was OK.   Typically BP at home is between 300-762 systolic.    Current Outpatient Prescriptions on File Prior to Visit  Medication Sig Dispense Refill  . albuterol (PROVENTIL HFA;VENTOLIN HFA) 108 (90 BASE) MCG/ACT inhaler Inhale 2 puffs into the lungs every 6 (six) hours as needed. For shortness of breath 2 Inhaler 1  . amLODipine (NORVASC) 10 MG tablet TAKE 1 TABLET DAILY 100 tablet 2  . aspirin 81 MG chewable tablet Chew 81 mg by mouth daily.    . cetirizine (ZYRTEC) 10 MG tablet Take 10 mg by mouth every morning.    Marland Kitchen COLCRYS 0.6 MG tablet Take 0.6 mg by mouth daily as needed (for gout flareups). Reported on 10/26/2015    . Cyanocobalamin (VITAMIN B 12 PO) Take 1 tablet by mouth daily.      Marland Kitchen esomeprazole (NEXIUM) 40 MG capsule TAKE 1 CAPSULE DAILY BEFORE BREAKFAST 100 capsule 2  . FeFum-FePoly-FA-B Cmp-C-Biot (INTEGRA PLUS) CAPS Take 1 tablet by mouth daily.    . hydrALAZINE (APRESOLINE) 50 MG tablet Take 1 tablet (50 mg total) by mouth 3 (three) times daily. 270 tablet 2  . lisinopril (PRINIVIL,ZESTRIL) 20 MG tablet TAKE 1 TABLET TWICE A DAY 200 tablet 2  . loratadine (CLARITIN) 10 MG tablet Take 10 mg by mouth at bedtime.     . metoprolol succinate (TOPROL-XL) 50 MG 24 hr tablet TAKE 1 TABLET DAILY WITH OR IMMEDIATELY FOLLOWING A MEAL 100 tablet 2  . mometasone (NASONEX) 50 MCG/ACT nasal spray Place 1-2 sprays into the nose as needed.    . montelukast (SINGULAIR) 10 MG tablet TAKE 1 TABLET BY MOUTH AT BEDTIME 90 tablet 3  . Multiple Vitamin (MULTIVITAMIN WITH MINERALS) TABS tablet Take 1 tablet by mouth daily.    . Multiple Vitamins-Minerals (VISION-VITE PRESERVE PO) Take 1 tablet by mouth 2 (two) times daily.     . nitroGLYCERIN (NITROSTAT) 0.4 MG SL tablet Place 1 tablet (0.4 mg total) under the tongue every 5 (five) minutes as needed for chest pain (up to 3 doses. not within 24hrs of Viagra). 25 tablet 6  . pravastatin (PRAVACHOL) 10 MG tablet Take 1 tablet every other day, alternating with 2 tablets. 45 tablet  6  . Saline (SIMPLY SALINE) 0.9 % AERS Place 1 spray into the nose daily as needed (congestion).     . traMADol (ULTRAM) 50 MG tablet TAKE 1 TABLET EVERY 12 HOURS (Patient taking differently: TAKE 1 TABLET EVERY 12 HOURS As Needed) 120 tablet 4  . ULORIC 80 MG TABS Take 1 tablet by mouth daily. Reported on 10/26/2015     Current Facility-Administered Medications on File Prior to Visit  Medication Dose Route Frequency Provider Last Rate Last Dose  . 0.9 %  sodium chloride infusion  500 mL Intravenous Continuous Irene Shipper, MD        Allergies  Allergen Reactions  . Prednisone Shortness Of Breath and Swelling  . Crestor [Rosuvastatin Calcium] Other (See Comments)     myalgia  . Lyrica [Pregabalin]     "swelling"    Past Medical History:  Diagnosis Date  . Allergic rhinitis   . Anemia   . Asthma   . CAD (coronary artery disease)    a. s/p PTCA/stenting of the mid LAD and mid RCA 04/06/12  . Carotid bruit    r  . Colon polyp   . Diverticulosis   . ED (erectile dysfunction)   . Fatty liver   . GERD (gastroesophageal reflux disease)   . Gout   . Hyperlipidemia   . Hypertension   . Obesity   . Osteoarthritis   . Renal insufficiency   . Restless leg syndrome     Past Surgical History:  Procedure Laterality Date  . COLONOSCOPY    . INGUINAL HERNIA REPAIR Right 02/27/2016   Procedure: HERNIA REPAIR INGUINAL ADULT;  Surgeon: Rolm Bookbinder, MD;  Location: [place];  Service: General;  Laterality: Right;  . INSERTION OF MESH Right 02/27/2016   Procedure: INSERTION OF MESH--IN AND OUT CATHETERIZATION PERFORMED AT END OF PROCEDURE TO DRAIN 400ML URINE.;  Surgeon: Rolm Bookbinder, MD;  Location: [place];  Service: General;  Laterality: Right;  . NM PET LYMPHOMA     left side of neck 1999  . PERCUTANEOUS CORONARY STENT INTERVENTION (PCI-S) N/A 04/06/2012   Procedure: PERCUTANEOUS CORONARY STENT INTERVENTION (PCI-S);  Surgeon: Dacota Devall M Martinique, MD; Promus DES for 90% mid LAD and 95% mid RCA disease      History  Smoking Status  . Former Smoker  . Quit date: 06/04/1975  Smokeless Tobacco  . Never Used    History  Alcohol Use No    Family History  Problem Relation Age of Onset  . Coronary artery disease Father        in his 51s  . Heart disease Father   . Heart attack Father   . Hypertension Mother   . Stroke Mother        Several   . Stomach cancer Mother   . Glaucoma Mother   . Hypertension Paternal Grandmother   . Hypertension Paternal Grandfather   . Colon cancer Neg Hx   . Esophageal cancer Neg Hx   . Rectal cancer Neg Hx     Review of Systems: The review of systems is per the HPI.   All other systems were reviewed and are negative.  Physical Exam: BP (!) 160/70 (BP Location: Left Arm, Cuff Size: Normal)   Pulse (!) 56   Ht 5\' 7"  (1.702 m)   Wt 227 lb (103 kg)   BMI 35.55 kg/m  Patient is an obese WM in no acute distress. Skin is warm and dry. Color is  normal.  HEENT is unremarkable. Normocephalic/atraumatic. PERRL. Sclera are nonicteric. Neck is supple. No masses. No JVD. Lungs are clear. Cardiac exam shows a regular rate and rhythm. normal S1-2. No gallop or murmur. Abdomen is soft. Extremities show 1+ right ankle edema.  Gait and ROM are intact. No gross neurologic deficits noted.  LABORATORY DATA: Lab Results  Component Value Date   WBC 8.7 02/23/2016   HGB 11.8 (L) 02/23/2016   HCT 35.2 (L) 02/23/2016   PLT 233 02/23/2016   GLUCOSE 90 02/23/2016   CHOL 189 08/19/2016   TRIG 99 08/19/2016   HDL 54 08/19/2016   LDLDIRECT 164.9 12/10/2010   LDLCALC 115 (H) 08/19/2016   ALT 21 08/19/2016   AST 27 08/19/2016   NA 133 (L) 02/23/2016   K 4.2 02/23/2016   CL 100 (L) 02/23/2016   CREATININE 1.31 (H) 02/23/2016   BUN 16 02/23/2016   CO2 24 02/23/2016   TSH 2.659 07/04/2015   PSA 0.63 10/26/2015   INR 1.0 04/03/2012   HGBA1C 5.6 10/26/2015   Lexiscan myoview 07/28/15: Study Highlights     There was no ST segment deviation noted during stress.  The left ventricular ejection fraction is normal (55-65%).  Nuclear stress EF: 56%.  Defect 1: There is a medium defect of moderate severity present in the basal inferoseptal, basal inferior, mid inferior and apical inferior location. In setting of normal LVF, this is consistent with diaphragmatic attenuation artifact. No ischemia noted.  This is a low risk study.    Addendum by Sueanne Margarita, MD on Fri Jul 28, 2015 2:03 PM    There was no ST segment deviation noted during stress.  The left ventricular ejection fraction is normal (55-65%).  Nuclear stress EF: 56%.  Defect 1: There is a medium defect  of moderate severity present in the basal inferoseptal, basal inferior, mid inferior and apical inferior location. In setting of normal LVF, this is consistent with diaphragmatic attenuation artifact. No ischemia noted.  This is a low risk study.    Echo: 08/25/15: Study Conclusions  - Left ventricle: The cavity size was normal. Wall thickness was   normal. Systolic function was normal. The estimated ejection   fraction was in the range of 60% to 65%. Wall motion was normal;   there were no regional wall motion abnormalities. - Aortic valve: Moderately calcified annulus. Moderately calcified   leaflets. There was mild stenosis. - Mitral valve: There was mild regurgitation. - Left atrium: The atrium was mildly dilated  Assessment / Plan:  1. CAD - status post 2 vessel PCI with DES in November 2013- doing well.  Myoview study in February 2017 was low risk with an area of inferior scar and no ischemia. Unchanged from 2014. We will continue medical therapy and risk factor modification.   2. HTN - blood pressure is elevated today but typically under OK control. He reports it does fluctuate. He is on multiple meds. Will monitor on current therapy. Encourage weight loss.   3. Hyponatremia secondary to chlorthalidone. Improved.  4. CKD stage 3. Last creatinine 1.31.   5. Hyperlipidemia. Intolerant of statins. Continue with  dietary modification and weight loss. Almost a 50 point drop in LDL with pravastatin. Tolerating well.   6. Carotid arterial disease with moderate 09-60% LICA stenosis, 45-40% RICA stenosis in March 2018.  Asymptomatic. Recommend yearly follow up doppler.   7. History of mild Aortic stenosis.

## 2016-10-15 ENCOUNTER — Encounter: Payer: Self-pay | Admitting: Cardiology

## 2016-10-15 ENCOUNTER — Ambulatory Visit (INDEPENDENT_AMBULATORY_CARE_PROVIDER_SITE_OTHER): Payer: BLUE CROSS/BLUE SHIELD | Admitting: Cardiology

## 2016-10-15 VITALS — BP 160/70 | HR 56 | Ht 67.0 in | Wt 227.0 lb

## 2016-10-15 DIAGNOSIS — I10 Essential (primary) hypertension: Secondary | ICD-10-CM

## 2016-10-15 DIAGNOSIS — I6523 Occlusion and stenosis of bilateral carotid arteries: Secondary | ICD-10-CM | POA: Diagnosis not present

## 2016-10-15 DIAGNOSIS — I350 Nonrheumatic aortic (valve) stenosis: Secondary | ICD-10-CM | POA: Diagnosis not present

## 2016-10-15 DIAGNOSIS — E7800 Pure hypercholesterolemia, unspecified: Secondary | ICD-10-CM | POA: Diagnosis not present

## 2016-10-15 DIAGNOSIS — I2510 Atherosclerotic heart disease of native coronary artery without angina pectoris: Secondary | ICD-10-CM

## 2016-10-15 NOTE — Patient Instructions (Signed)
Continue your current therapy  I will see you in 6 months.   

## 2016-11-19 ENCOUNTER — Other Ambulatory Visit: Payer: Self-pay | Admitting: Family Medicine

## 2016-11-19 MED ORDER — LISINOPRIL 20 MG PO TABS: 20.0000 mg | ORAL_TABLET | Freq: Two times a day (BID) | ORAL | 2 refills | Status: DC

## 2016-12-10 ENCOUNTER — Other Ambulatory Visit: Payer: Self-pay | Admitting: Family Medicine

## 2016-12-10 DIAGNOSIS — J309 Allergic rhinitis, unspecified: Secondary | ICD-10-CM

## 2016-12-10 MED ORDER — ALBUTEROL SULFATE HFA 108 (90 BASE) MCG/ACT IN AERS: 2.0000 | INHALATION_SPRAY | Freq: Four times a day (QID) | RESPIRATORY_TRACT | 0 refills | Status: DC | PRN

## 2016-12-10 NOTE — Telephone Encounter (Signed)
He can have a refill.  

## 2016-12-10 NOTE — Telephone Encounter (Signed)
Rx done. 

## 2016-12-10 NOTE — Telephone Encounter (Signed)
Pt requesting refill.  Todd Spears has not filled this medication for the pt.

## 2016-12-25 ENCOUNTER — Ambulatory Visit (INDEPENDENT_AMBULATORY_CARE_PROVIDER_SITE_OTHER): Payer: BLUE CROSS/BLUE SHIELD | Admitting: Podiatry

## 2016-12-25 ENCOUNTER — Encounter: Payer: Self-pay | Admitting: Podiatry

## 2016-12-25 DIAGNOSIS — Q828 Other specified congenital malformations of skin: Secondary | ICD-10-CM | POA: Diagnosis not present

## 2016-12-25 DIAGNOSIS — B351 Tinea unguium: Secondary | ICD-10-CM

## 2016-12-25 DIAGNOSIS — M79676 Pain in unspecified toe(s): Secondary | ICD-10-CM | POA: Diagnosis not present

## 2016-12-25 DIAGNOSIS — M216X9 Other acquired deformities of unspecified foot: Secondary | ICD-10-CM

## 2016-12-25 NOTE — Progress Notes (Signed)
Patient ID: Todd Spears, male   DOB: August 17, 1945, 71 y.o.   MRN: [id number] Complaint:  Visit Type: Patient returns to my office for continued preventative foot care services. Complaint: Patient states" my nails have grown long and thick and become painful to walk and wear shoes". The patient presents for preventative foot care services. No changes to ROS.  Painful callus under the outside ball of both feet.  Podiatric Exam: Vascular: dorsalis pedis and posterior tibial pulses are palpable bilateral. Capillary return is immediate. Temperature gradient is WNL. Skin turgor WNL  Sensorium: Normal Semmes Weinstein monofilament test. Normal tactile sensation bilaterally. Nail Exam: Pt has thick disfigured discolored nails with subungual debris noted bilateral entire nail hallux through fifth toenails Ulcer Exam: There is no evidence of ulcer or pre-ulcerative changes or infection. Orthopedic Exam: Muscle tone and strength are WNL. No limitations in general ROM. No crepitus or effusions noted. Foot type and digits show no abnormalities. Bony prominences are unremarkable. Skin:  Porokeratosis sub 5th metatarsal head both feet.. No infection or ulcers  Diagnosis:  Onychomycosis, , Pain in right toe, pain in left toes. Porokeratosis B/L  Treatment & Plan Procedures and Treatment: Consent by patient was obtained for treatment procedures. The patient understood the discussion of treatment and procedures well. All questions were answered thoroughly reviewed. Debridement of mycotic and hypertrophic toenails, 1 through 5 bilateral and clearing of subungual debris. No ulceration, no infection noted. Debride porokeratosis Return Visit-Office Procedure: Patient instructed to return to the office for a follow up visit 3 months for continued evaluation and treatment.  Gardiner Barefoot DPM

## 2016-12-27 ENCOUNTER — Encounter: Payer: Self-pay | Admitting: Family Medicine

## 2016-12-27 ENCOUNTER — Ambulatory Visit (INDEPENDENT_AMBULATORY_CARE_PROVIDER_SITE_OTHER): Payer: BLUE CROSS/BLUE SHIELD | Admitting: Family Medicine

## 2016-12-27 VITALS — BP 130/70 | HR 62 | Temp 98.2°F | Wt 212.9 lb

## 2016-12-27 DIAGNOSIS — J329 Chronic sinusitis, unspecified: Secondary | ICD-10-CM

## 2016-12-27 MED ORDER — AMOXICILLIN-POT CLAVULANATE 875-125 MG PO TABS: 1.0000 | ORAL_TABLET | Freq: Two times a day (BID) | ORAL | 0 refills | Status: DC

## 2016-12-27 NOTE — Patient Instructions (Signed)
Consider Netti pot for saline nasal irrigation and be sure to use distilled water Start the Augmentin Consider elevate head of bed. 5 to 6 inches Touch base in 2 weeks if no better.

## 2016-12-29 NOTE — Progress Notes (Signed)
Subjective:     Patient ID: Todd Spears, male   DOB: [birth year], 71 y.o.   MRN: [id number]  HPI Pt is seen with chronic sinus congestive symptoms- mostly maxillary L > R, but some frontal sinuses as well.  He has hx of "allergies" and has tried multiple things including oral anti-histamines, nasal steroids, and Singulair with no improvement.    No fever.  No bloody discharge.  Occasional headaches.  Has recently has some green to yellow colored mucus.  No cough.  No hoarseness.    Past Medical History:  Diagnosis Date  . Allergic rhinitis   . Anemia   . Asthma   . CAD (coronary artery disease)    a. s/p PTCA/stenting of the mid LAD and mid RCA 04/06/12  . Carotid bruit    r  . Colon polyp   . Diverticulosis   . ED (erectile dysfunction)   . Fatty liver   . GERD (gastroesophageal reflux disease)   . Gout   . Hyperlipidemia   . Hypertension   . Obesity   . Osteoarthritis   . Renal insufficiency   . Restless leg syndrome    Past Surgical History:  Procedure Laterality Date  . COLONOSCOPY    . INGUINAL HERNIA REPAIR Right 02/27/2016   Procedure: HERNIA REPAIR INGUINAL ADULT;  Surgeon: Rolm Bookbinder, MD;  Location: [facility];  Service: General;  Laterality: Right;  . INSERTION OF MESH Right 02/27/2016   Procedure: INSERTION OF MESH--IN AND OUT CATHETERIZATION PERFORMED AT END OF PROCEDURE TO DRAIN 400ML URINE.;  Surgeon: Rolm Bookbinder, MD;  Location: [facility];  Service: General;  Laterality: Right;  . NM PET LYMPHOMA     left side of neck 1999  . PERCUTANEOUS CORONARY STENT INTERVENTION (PCI-S) N/A 04/06/2012   Procedure: PERCUTANEOUS CORONARY STENT INTERVENTION (PCI-S);  Surgeon: Peter M Martinique, MD; Promus DES for 90% mid LAD and 95% mid RCA disease      reports that he quit smoking about 41 years ago. He has never used smokeless tobacco. He reports that he does not drink alcohol or use drugs. family history includes Coronary artery disease  in his father; Glaucoma in his mother; Heart attack in his father; Heart disease in his father; Hypertension in his mother, paternal grandfather, and paternal grandmother; Stomach cancer in his mother; Stroke in his mother. Allergies  Allergen Reactions  . Prednisone Shortness Of Breath and Swelling  . Crestor [Rosuvastatin Calcium] Other (See Comments)    myalgia  . Lyrica [Pregabalin]     "swelling"     Review of Systems  Constitutional: Positive for fatigue. Negative for chills and fever.  HENT: Positive for congestion, sinus pain and sinus pressure. Negative for facial swelling.   Respiratory: Negative for cough and shortness of breath.        Objective:   Physical Exam  Constitutional: He appears well-developed and well-nourished.  HENT:  Right Ear: External ear normal.  Left Ear: External ear normal.  Nose: Nose normal.  Mouth/Throat: Oropharynx is clear and moist.  Neck: Neck supple.  Cardiovascular: Normal rate and regular rhythm.   Pulmonary/Chest: Effort normal and breath sounds normal. No respiratory distress. He has no wheezes. He has no rales.  Lymphadenopathy:    He has no cervical adenopathy.       Assessment:     Chronic rhinitis/?sinustis.  Recent progressive right maxillary symptoms.    Plan:     -recommend trial of saline nasal irrigation with  Netti pot and distilled water. -trial of Augmentin 875 mg po bid for 10 days -stay well hydrated -continue the Singulair and nasal steroids. -touch base in 2 weeks if not better.  Eulas Post MD [facility]

## 2017-02-21 ENCOUNTER — Encounter: Payer: Self-pay | Admitting: Adult Health

## 2017-03-02 ENCOUNTER — Other Ambulatory Visit: Payer: Self-pay | Admitting: Family Medicine

## 2017-03-05 ENCOUNTER — Telehealth: Payer: Self-pay | Admitting: Adult Health

## 2017-03-05 NOTE — Telephone Encounter (Signed)
Error pt changed his mind about wanting a msg sent.

## 2017-03-11 ENCOUNTER — Other Ambulatory Visit: Payer: Self-pay | Admitting: Family Medicine

## 2017-03-18 ENCOUNTER — Encounter: Payer: Self-pay | Admitting: Adult Health

## 2017-03-18 ENCOUNTER — Ambulatory Visit (INDEPENDENT_AMBULATORY_CARE_PROVIDER_SITE_OTHER): Payer: BLUE CROSS/BLUE SHIELD | Admitting: Adult Health

## 2017-03-18 VITALS — BP 170/70 | Temp 98.4°F | Ht 65.5 in | Wt 229.0 lb

## 2017-03-18 DIAGNOSIS — Z0000 Encounter for general adult medical examination without abnormal findings: Secondary | ICD-10-CM

## 2017-03-18 DIAGNOSIS — Z1159 Encounter for screening for other viral diseases: Secondary | ICD-10-CM | POA: Diagnosis not present

## 2017-03-18 DIAGNOSIS — E7800 Pure hypercholesterolemia, unspecified: Secondary | ICD-10-CM

## 2017-03-18 DIAGNOSIS — I10 Essential (primary) hypertension: Secondary | ICD-10-CM

## 2017-03-18 DIAGNOSIS — Z23 Encounter for immunization: Secondary | ICD-10-CM | POA: Diagnosis not present

## 2017-03-18 DIAGNOSIS — I2510 Atherosclerotic heart disease of native coronary artery without angina pectoris: Secondary | ICD-10-CM

## 2017-03-18 LAB — BASIC METABOLIC PANEL
BUN: 17 mg/dL (ref 6–23)
CALCIUM: 9.5 mg/dL (ref 8.4–10.5)
CO2: 28 meq/L (ref 19–32)
Chloride: 101 mEq/L (ref 96–112)
Creatinine, Ser: 1.14 mg/dL (ref 0.40–1.50)
GFR: 67.16 mL/min (ref >60.00)
GLUCOSE: 116 mg/dL — AB (ref 70–99)
POTASSIUM: 4.6 meq/L (ref 3.5–5.1)
SODIUM: 138 meq/L (ref 135–145)

## 2017-03-18 LAB — LIPID PANEL
CHOL/HDL RATIO: 4
Cholesterol: 196 mg/dL (ref 0–200)
HDL: 49.4 mg/dL (ref >39.00)
LDL Cholesterol: 122 mg/dL — ABNORMAL HIGH (ref 0–99)
NONHDL: 146.72
Triglycerides: 125 mg/dL (ref 0.0–149.0)
VLDL: 25 mg/dL (ref 0.0–40.0)

## 2017-03-18 LAB — HEPATIC FUNCTION PANEL
ALK PHOS: 46 U/L (ref 39–117)
ALT: 26 U/L (ref 0–53)
AST: 24 U/L (ref 0–37)
Albumin: 3.9 g/dL (ref 3.5–5.2)
BILIRUBIN DIRECT: 0.1 mg/dL (ref 0.0–0.3)
Total Bilirubin: 0.5 mg/dL (ref 0.2–1.2)
Total Protein: 6.4 g/dL (ref 6.0–8.3)

## 2017-03-18 LAB — PSA: PSA: 0.9 ng/mL (ref 0.10–4.00)

## 2017-03-18 LAB — CBC WITH DIFFERENTIAL/PLATELET
BASOS ABS: 0.1 10*3/uL (ref 0.0–0.1)
Basophils Relative: 2 % (ref 0.0–3.0)
EOS ABS: 0.7 10*3/uL (ref 0.0–0.7)
EOS PCT: 11.2 % — AB (ref 0.0–5.0)
HCT: 36.6 % — ABNORMAL LOW (ref 39.0–52.0)
Hemoglobin: 12.2 g/dL — ABNORMAL LOW (ref 13.0–17.0)
LYMPHS ABS: 2.1 10*3/uL (ref 0.7–4.0)
LYMPHS PCT: 34.6 % (ref 12.0–46.0)
MCHC: 33.4 g/dL (ref 30.0–36.0)
MCV: 93.6 fl (ref 78.0–100.0)
MONOS PCT: 9.1 % (ref 3.0–12.0)
Monocytes Absolute: 0.5 10*3/uL (ref 0.1–1.0)
NEUTROS ABS: 2.6 10*3/uL (ref 1.4–7.7)
NEUTROS PCT: 43.1 % (ref 43.0–77.0)
PLATELETS: 237 10*3/uL (ref 150.0–400.0)
RBC: 3.91 Mil/uL — ABNORMAL LOW (ref 4.22–5.81)
RDW: 12.5 % (ref 11.5–15.5)
WBC: 6 10*3/uL (ref 4.0–10.5)

## 2017-03-18 LAB — TSH: TSH: 3.25 u[IU]/mL (ref 0.35–4.50)

## 2017-03-18 NOTE — Progress Notes (Signed)
Subjective:    Patient ID: Todd Spears, male    DOB: September 05, 1945, 71 y.o.   MRN: [id number]  HPI  Patient presents for yearly preventative medicine examination. He is a pleasant 71 year old male who  has a past medical history of Allergic rhinitis; Anemia; Asthma; CAD (coronary artery disease); Carotid bruit; Colon polyp; Diverticulosis; ED (erectile dysfunction); Fatty liver; GERD (gastroesophageal reflux disease); Gout; Hyperlipidemia; Hypertension; Obesity; Osteoarthritis; Renal insufficiency; and Restless leg syndrome.  He takes Norvasc 10 mg daily, Hydralazine 50 mg TID, Lisinopril 20 mg BID, and Toprol XL 50 mg daily for blood pressure control. He is helped by Cardiology for management of CAD and hypertension. He had carotid dopplers showing 1-39% RICA and 21-22% LICA stenoses. Echo done in March 2017 showed normal LV function and mild AS unchanged from 2013. He is checking his blood pressures at home and reports blood pressures between 482-500 systolic   He is also seen by Urology yearly - has not seen this year   He takes Pravastatin 10 mg daily and 20 mg every other day,  and ASA 81 mg for history of hyperlipidemia - Lipid clinic   All immunizations and health maintenance protocols were reviewed with the patient and needed orders were placed.  Appropriate screening laboratory values were ordered for the patient including screening of hyperlipidemia, renal function and hepatic function. If indicated by BPH, a PSA was ordered.  Medication reconciliation,  past medical history, social history, problem list and allergies were reviewed in detail with the patient  Goals were established with regard to weight loss, exercise, and  diet in compliance with medications. He is not exercising but is eating healthy and is doing portion control. He does not eat fast food. His weight is up from July   Wt Readings from Last 3 Encounters:  03/18/17 229 lb (103.9 kg)  12/27/16 212 lb 14.4 oz (96.6  kg)  10/15/16 227 lb (103 kg)   End of life planning was discussed.  He is up to date on his colonoscopy. He participates in routine dental and vision care  He denies any acute complaints.   Review of Systems  Constitutional: Negative.   HENT: Negative.   Eyes: Negative.   Respiratory: Negative.   Cardiovascular: Negative.   Gastrointestinal: Negative.   Endocrine: Negative.   Genitourinary: Negative.   Musculoskeletal: Negative.   Skin: Negative.   Allergic/Immunologic: Negative.   Neurological: Negative.   Hematological: Negative.   Psychiatric/Behavioral: Negative.   All other systems reviewed and are negative.  Past Medical History:  Diagnosis Date  . Allergic rhinitis   . Anemia   . Asthma   . CAD (coronary artery disease)    a. s/p PTCA/stenting of the mid LAD and mid RCA 04/06/12  . Carotid bruit    r  . Colon polyp   . Diverticulosis   . ED (erectile dysfunction)   . Fatty liver   . GERD (gastroesophageal reflux disease)   . Gout   . Hyperlipidemia   . Hypertension   . Obesity   . Osteoarthritis   . Renal insufficiency   . Restless leg syndrome     Social History   Social History  . Marital status: Married    Spouse name: N/A  . Number of children: 1  . Years of education: N/A   Occupational History  .  [employer] History Main Topics  . Smoking status: Former Smoker    Quit date:  06/04/1975  . Smokeless tobacco: Never Used  . Alcohol use No  . Drug use: No  . Sexual activity: Not Currently   Other Topics Concern  . Not on file   Social History Narrative   Works as a Engineer, building services    Married   One son ( lives in [place])    Regular exercise- no    Past Surgical History:  Procedure Laterality Date  . COLONOSCOPY    . INGUINAL HERNIA REPAIR Right 02/27/2016   Procedure: HERNIA REPAIR INGUINAL ADULT;  Surgeon: Rolm Bookbinder, MD;  Location: [facility];  Service: General;  Laterality: Right;  .  INSERTION OF MESH Right 02/27/2016   Procedure: INSERTION OF MESH--IN AND OUT CATHETERIZATION PERFORMED AT END OF PROCEDURE TO DRAIN 400ML URINE.;  Surgeon: Rolm Bookbinder, MD;  Location: [facility];  Service: General;  Laterality: Right;  . NM PET LYMPHOMA     left side of neck 1999  . PERCUTANEOUS CORONARY STENT INTERVENTION (PCI-S) N/A 04/06/2012   Procedure: PERCUTANEOUS CORONARY STENT INTERVENTION (PCI-S);  Surgeon: Peter M Martinique, MD; Promus DES for 90% mid LAD and 95% mid RCA disease      Family History  Problem Relation Age of Onset  . Coronary artery disease Father        in his 61s  . Heart disease Father   . Heart attack Father   . Hypertension Mother   . Stroke Mother        Several   . Stomach cancer Mother   . Glaucoma Mother   . Hypertension Paternal Grandmother   . Hypertension Paternal Grandfather   . Colon cancer Neg Hx   . Esophageal cancer Neg Hx   . Rectal cancer Neg Hx     Allergies  Allergen Reactions  . Prednisone Shortness Of Breath and Swelling  . Crestor [Rosuvastatin Calcium] Other (See Comments)    myalgia  . Lyrica [Pregabalin]     "swelling"    Current Outpatient Prescriptions on File Prior to Visit  Medication Sig Dispense Refill  . albuterol (PROVENTIL HFA;VENTOLIN HFA) 108 (90 Base) MCG/ACT inhaler Inhale 2 puffs into the lungs every 6 (six) hours as needed. For shortness of breath 1 Inhaler 0  . amLODipine (NORVASC) 10 MG tablet TAKE 1 TABLET DAILY 90 tablet 0  . aspirin 81 MG chewable tablet Chew 81 mg by mouth daily.    . cetirizine (ZYRTEC) 10 MG tablet Take 10 mg by mouth every morning.    Marland Kitchen COLCRYS 0.6 MG tablet Take 0.6 mg by mouth daily as needed (for gout flareups). Reported on 10/26/2015    . Cyanocobalamin (VITAMIN B 12 PO) Take 1 tablet by mouth daily.     Marland Kitchen esomeprazole (NEXIUM) 40 MG capsule TAKE 1 CAPSULE DAILY BEFOREBREAKFAST 90 capsule 2  . FeFum-FePoly-FA-B Cmp-C-Biot (INTEGRA PLUS) CAPS Take 1 tablet by  mouth daily.    . hydrALAZINE (APRESOLINE) 50 MG tablet Take 1 tablet (50 mg total) by mouth 3 (three) times daily. 270 tablet 2  . lisinopril (PRINIVIL,ZESTRIL) 20 MG tablet Take 1 tablet (20 mg total) by mouth 2 (two) times daily. 200 tablet 2  . loratadine (CLARITIN) 10 MG tablet Take 10 mg by mouth at bedtime.     . metoprolol succinate (TOPROL-XL) 50 MG 24 hr tablet TAKE 1 TABLET DAILY WITH OR IMMEDIATELY FOLLOWING A MEAL 100 tablet 2  . mometasone (NASONEX) 50 MCG/ACT nasal spray Place 1-2 sprays into the nose as needed.    Marland Kitchen  montelukast (SINGULAIR) 10 MG tablet TAKE 1 TABLET BY MOUTH AT BEDTIME 90 tablet 3  . Multiple Vitamin (MULTIVITAMIN WITH MINERALS) TABS tablet Take 1 tablet by mouth daily.    . Multiple Vitamins-Minerals (VISION-VITE PRESERVE PO) Take 1 tablet by mouth 2 (two) times daily.     . nitroGLYCERIN (NITROSTAT) 0.4 MG SL tablet Place 1 tablet (0.4 mg total) under the tongue every 5 (five) minutes as needed for chest pain (up to 3 doses. not within 24hrs of Viagra). 25 tablet 6  . pravastatin (PRAVACHOL) 10 MG tablet Take 1 tablet every other day, alternating with 2 tablets. 45 tablet 6  . Saline (SIMPLY SALINE) 0.9 % AERS Place 1 spray into the nose daily as needed (congestion).     . traMADol (ULTRAM) 50 MG tablet TAKE 1 TABLET EVERY 12 HOURS (Patient taking differently: TAKE 1 TABLET EVERY 12 HOURS As Needed) 120 tablet 4  . ULORIC 80 MG TABS Take 1 tablet by mouth daily. Reported on 10/26/2015     No current facility-administered medications on file prior to visit.     BP (!) 170/70 (BP Location: Left Arm)   Temp 98.4 F (36.9 C) (Oral)   Ht 5' 5.5" (1.664 m)   Wt 229 lb (103.9 kg)   BMI 37.53 kg/m       Objective:   Physical Exam  Constitutional: He is oriented to person, place, and time. He appears well-developed and well-nourished. No distress.  Obese   HENT:  Head: Normocephalic and atraumatic.  Right Ear: External ear normal.  Left Ear: External ear  normal.  Nose: Nose normal.  Mouth/Throat: Oropharynx is clear and moist. No oropharyngeal exudate.  Eyes: Pupils are equal, round, and reactive to light. Conjunctivae are normal. Right eye exhibits no discharge. Left eye exhibits no discharge.  Neck: Normal range of motion. Neck supple. No JVD present. Carotid bruit is present. No tracheal deviation present. No thyromegaly present.  Cardiovascular: Normal rate, regular rhythm and intact distal pulses.  Exam reveals no gallop and no friction rub.   Murmur (heard best at left sternal boarder ) heard. Pulmonary/Chest: Effort normal and breath sounds normal. No stridor. No respiratory distress. He has no wheezes. He has no rales. He exhibits no tenderness.  Abdominal: Soft. Bowel sounds are normal. He exhibits no distension and no mass. There is no tenderness. There is no rebound and no guarding.  Genitourinary: Rectum normal and prostate normal.  Musculoskeletal: Normal range of motion. He exhibits edema (+ 1 pitting edema in bilateral lower extremities ). He exhibits no tenderness or deformity.  Lymphadenopathy:    He has no cervical adenopathy.  Neurological: He is alert and oriented to person, place, and time. He has normal reflexes. No cranial nerve deficit. Coordination normal.  Skin: Skin is warm and dry. No rash noted. He is not diaphoretic. No erythema. No pallor.  Psychiatric: He has a normal mood and affect. His behavior is normal. Judgment and thought content normal.  Nursing note and vitals reviewed.     Assessment & Plan:  1. Routine general medical examination at a health care facility - Basic metabolic panel - CBC with Differential/Platelet - Hepatic function panel - Lipid panel - TSH - Hep C Antibody - PSA  2. Pure hypercholesterolemia - Continue with POC by Lipid clinic  - Weight loss would be beneficial  - Basic metabolic panel - CBC with Differential/Platelet - Hepatic function panel - Lipid panel - TSH  3.  Essential hypertension -  Not controlled in the office today - he took his medications prior to arrival  - Basic metabolic panel - CBC with Differential/Platelet - Hepatic function panel - Lipid panel - TSH - PSA  4. Coronary artery disease involving native coronary artery of native heart without angina pectoris - Continue with medications and follow up with Cardiology as directed  - Encouraged to restart exercising  - Basic metabolic panel - CBC with Differential/Platelet - Hepatic function panel - Lipid panel - TSH  5. Need for hepatitis C screening test  - Hep C Antibody  6. Need for influenza vaccination  - Flu vaccine HIGH DOSE PF (Fluzone High dose)  Dorothyann Peng, NP

## 2017-03-18 NOTE — Patient Instructions (Signed)
It was great seeing you today   I will follow up with you regarding your blood work   Please work on weight loss through diet and exercise   

## 2017-03-19 LAB — HEPATITIS C ANTIBODY
HEP C AB: NONREACTIVE
SIGNAL TO CUT-OFF: 0.01 (ref <1.00)

## 2017-03-24 ENCOUNTER — Other Ambulatory Visit: Payer: Self-pay | Admitting: *Deleted

## 2017-03-24 DIAGNOSIS — I739 Peripheral vascular disease, unspecified: Principal | ICD-10-CM

## 2017-03-24 DIAGNOSIS — I779 Disorder of arteries and arterioles, unspecified: Secondary | ICD-10-CM

## 2017-04-01 ENCOUNTER — Ambulatory Visit (INDEPENDENT_AMBULATORY_CARE_PROVIDER_SITE_OTHER): Payer: BLUE CROSS/BLUE SHIELD | Admitting: Podiatry

## 2017-04-01 ENCOUNTER — Encounter: Payer: Self-pay | Admitting: Podiatry

## 2017-04-01 DIAGNOSIS — B351 Tinea unguium: Secondary | ICD-10-CM

## 2017-04-01 DIAGNOSIS — M79676 Pain in unspecified toe(s): Secondary | ICD-10-CM | POA: Diagnosis not present

## 2017-04-01 DIAGNOSIS — Q828 Other specified congenital malformations of skin: Secondary | ICD-10-CM | POA: Diagnosis not present

## 2017-04-01 DIAGNOSIS — M216X9 Other acquired deformities of unspecified foot: Secondary | ICD-10-CM

## 2017-04-01 NOTE — Progress Notes (Signed)
Patient ID: Todd Spears, male   DOB: 12/03/1945, 71 y.o.   MRN: [id number] Complaint:  Visit Type: Patient returns to my office for continued preventative foot care services. Complaint: Patient states" my nails have grown long and thick and become painful to walk and wear shoes". The patient presents for preventative foot care services. No changes to ROS.  Painful callus under the outside ball of both feet.  Podiatric Exam: Vascular: dorsalis pedis and posterior tibial pulses are palpable bilateral. Capillary return is immediate. Temperature gradient is WNL. Skin turgor WNL  Sensorium: Normal Semmes Weinstein monofilament test. Normal tactile sensation bilaterally. Nail Exam: Pt has thick disfigured discolored nails with subungual debris noted bilateral entire nail hallux through fifth toenails Ulcer Exam: There is no evidence of ulcer or pre-ulcerative changes or infection. Orthopedic Exam: Muscle tone and strength are WNL. No limitations in general ROM. No crepitus or effusions noted. HAV left with overlapping second.  HAV right foot. Skin:  Porokeratosis sub 5th metatarsal head both feet.. No infection or ulcers  Diagnosis:  Onychomycosis, , Pain in right toe, pain in left toes. Porokeratosis B/L  Treatment & Plan Procedures and Treatment: Consent by patient was obtained for treatment procedures. The patient understood the discussion of treatment and procedures well. All questions were answered thoroughly reviewed. Debridement of mycotic and hypertrophic toenails, 1 through 5 bilateral and clearing of subungual debris. No ulceration, no infection noted. Debride porokeratosis Return Visit-Office Procedure: Patient instructed to return to the office for a follow up visit 3 months for continued evaluation and treatment.  Gardiner Barefoot DPM

## 2017-04-03 NOTE — Progress Notes (Signed)
Todd Spears Date of Birth: 06/22/1945 Medical Record #[id number]  History of Present Illness: Todd Spears is seen back today for follow up CAD. He is s/p 2 vessel PCI with Promus DES for 90% mid LAD and 95% mid RCA disease on 04/06/2012. His EF is normal at 55%. At that time he really had no significant anginal symptoms but had an abnormal stress test.  Myoview in Nov. 2014 showed a fixed inferior defect without ischemia. Last Myoview in Feb. 2017 was unchanged. Other issues include CKD stage 3, HTN, obesity, gout,,OA, ED, carotid bruit and asthma.   He was admitted in January 2017 with tremors and hyponatremia. Chlorthalidone was discontinued.   He had carotid dopplers showing 1-39% RICA and 92-42% LICA stenoses. Echo done in March 2017 showed normal LV function and mild AS unchanged from 2013. He has been followed in our lipid clinic for hypercholesterolemia with prior intolerance to statins. He is now on pravastatin 10 mg alternating with 20 mg daily. He is doing OK with this regimen but sometimes will start having more cramps so he will back off.   He denies any chest pain or SOB. No TIA or CVA symptoms. 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 He brings extensive BP readings from home with good control generally.    Current Outpatient Medications on File Prior to Visit  Medication Sig Dispense Refill  . albuterol (PROVENTIL HFA;VENTOLIN HFA) 108 (90 Base) MCG/ACT inhaler Inhale 2 puffs into the lungs every 6 (six) hours as needed. For shortness of breath 1 Inhaler 0  . amLODipine (NORVASC) 10 MG tablet TAKE 1 TABLET DAILY 90 tablet 0  . aspirin 81 MG chewable tablet Chew 81 mg by mouth daily.    . cetirizine (ZYRTEC) 10 MG tablet Take 10 mg by mouth every morning.    Marland Kitchen COLCRYS 0.6 MG tablet Take 0.6 mg by mouth daily as needed (for gout flareups). Reported on 10/26/2015    . Cyanocobalamin (VITAMIN B 12 PO) Take 1 tablet by mouth daily.     Marland Kitchen esomeprazole (NEXIUM)  40 MG capsule TAKE 1 CAPSULE DAILY BEFOREBREAKFAST 90 capsule 2  . FeFum-FePoly-FA-B Cmp-C-Biot (INTEGRA PLUS) CAPS Take 1 tablet by mouth daily.    . hydrALAZINE (APRESOLINE) 50 MG tablet Take 1 tablet (50 mg total) by mouth 3 (three) times daily. 270 tablet 2  . lisinopril (PRINIVIL,ZESTRIL) 20 MG tablet Take 1 tablet (20 mg total) by mouth 2 (two) times daily. 200 tablet 2  . loratadine (CLARITIN) 10 MG tablet Take 10 mg by mouth at bedtime.     . metoprolol succinate (TOPROL-XL) 50 MG 24 hr tablet TAKE 1 TABLET DAILY WITH OR IMMEDIATELY FOLLOWING A MEAL 100 tablet 2  . mometasone (NASONEX) 50 MCG/ACT nasal spray Place 1-2 sprays into the nose as needed.    . montelukast (SINGULAIR) 10 MG tablet TAKE 1 TABLET BY MOUTH AT BEDTIME 90 tablet 3  . Multiple Vitamin (MULTIVITAMIN WITH MINERALS) TABS tablet Take 1 tablet by mouth daily.    . Multiple Vitamins-Minerals (VISION-VITE PRESERVE PO) Take 1 tablet by mouth 2 (two) times daily.     . nitroGLYCERIN (NITROSTAT) 0.4 MG SL tablet Place 1 tablet (0.4 mg total) under the tongue every 5 (five) minutes as needed for chest pain (up to 3 doses. not within 24hrs of Viagra). 25 tablet 6  . pravastatin (PRAVACHOL) 10 MG tablet Take 1 tablet every other day, alternating with 2 tablets. [place]  tablet 6  . Saline (SIMPLY SALINE) 0.9 % AERS Place 1 spray into the nose daily as needed (congestion).     . traMADol (ULTRAM) 50 MG tablet TAKE 1 TABLET EVERY 12 HOURS (Patient taking differently: TAKE 1 TABLET EVERY 12 HOURS As Needed) 120 tablet 4  . ULORIC 80 MG TABS Take 1 tablet by mouth daily. Reported on 10/26/2015     No current facility-administered medications on file prior to visit.     Allergies  Allergen Reactions  . Prednisone Shortness Of Breath and Swelling  . Crestor [Rosuvastatin Calcium] Other (See Comments)    myalgia  . Lyrica [Pregabalin]     "swelling"    Past Medical History:  Diagnosis Date  . Allergic rhinitis   . Anemia   . Asthma    . CAD (coronary artery disease)    a. s/p PTCA/stenting of the mid LAD and mid RCA 04/06/12  . Carotid bruit    r  . Colon polyp   . Diverticulosis   . ED (erectile dysfunction)   . Fatty liver   . GERD (gastroesophageal reflux disease)   . Gout   . Hyperlipidemia   . Hypertension   . Obesity   . Osteoarthritis   . Renal insufficiency   . Restless leg syndrome     Past Surgical History:  Procedure Laterality Date  . COLONOSCOPY    . NM PET LYMPHOMA     left side of neck 1999    Social History   Tobacco Use  Smoking Status Former Smoker  . Last attempt to quit: 06/04/1975  . Years since quitting: 41.8  Smokeless Tobacco Never Used    Social History   Substance and Sexual Activity  Alcohol Use No  . Alcohol/week: 0.0 oz    Family History  Problem Relation Age of Onset  . Coronary artery disease Father        in his 63s  . Heart disease Father   . Heart attack Father   . Hypertension Mother   . Stroke Mother        Several   . Stomach cancer Mother   . Glaucoma Mother   . Hypertension Paternal Grandmother   . Hypertension Paternal Grandfather   . Colon cancer Neg Hx   . Esophageal cancer Neg Hx   . Rectal cancer Neg Hx     Review of Systems: The review of systems is per the HPI.  All other systems were reviewed and are negative.  Physical Exam: BP (!) 160/76   Pulse 60   Ht 5' 5.5" (1.664 m)   Wt 228 lb 3.2 oz (103.5 kg)   BMI 37.40 kg/m  GENERAL:  Well appearing, obese WM in NAD HEENT:  PERRL, EOMI, sclera are clear. Oropharynx is clear. NECK:  No jugular venous distention, carotid upstroke brisk and symmetric, soft right  bruit, no thyromegaly or adenopathy LUNGS:  Clear to auscultation bilaterally CHEST:  Unremarkable HEART:  RRR,  PMI not displaced or sustained,S1 and S2 within normal limits, no S3, no S4: no clicks, no rubs, no murmurs ABD:  Soft, nontender. BS +, no masses or bruits. No hepatomegaly, no splenomegaly EXT:  2 + pulses  throughout, no edema, no cyanosis no clubbing SKIN:  Warm and dry.  No rashes NEURO:  Alert and oriented x 3. Cranial nerves II through XII intact. PSYCH:  Cognitively intact    LABORATORY DATA: Lab Results  Component Value Date   WBC 6.0 03/18/2017  HGB 12.2 (L) 03/18/2017   HCT 36.6 (L) 03/18/2017   PLT 237.0 03/18/2017   GLUCOSE 116 (H) 03/18/2017   CHOL 196 03/18/2017   TRIG 125.0 03/18/2017   HDL 49.40 03/18/2017   LDLDIRECT 164.9 12/10/2010   LDLCALC 122 (H) 03/18/2017   ALT 26 03/18/2017   AST 24 03/18/2017   NA 138 03/18/2017   K 4.6 03/18/2017   CL 101 03/18/2017   CREATININE 1.14 03/18/2017   BUN 17 03/18/2017   CO2 28 03/18/2017   TSH 3.25 03/18/2017   PSA 0.90 03/18/2017   INR 1.0 04/03/2012   HGBA1C 5.6 10/26/2015    Ecg today shows NSR rate 60 with normal Ecg. I have personally reviewed and interpreted this study.  Lexiscan myoview 07/28/15: Study Highlights     There was no ST segment deviation noted during stress.  The left ventricular ejection fraction is normal (55-65%).  Nuclear stress EF: 56%.  Defect 1: There is a medium defect of moderate severity present in the basal inferoseptal, basal inferior, mid inferior and apical inferior location. In setting of normal LVF, this is consistent with diaphragmatic attenuation artifact. No ischemia noted.  This is a low risk study.    Addendum by Sueanne Margarita, MD on Fri Jul 28, 2015 2:03 PM    There was no ST segment deviation noted during stress.  The left ventricular ejection fraction is normal (55-65%).  Nuclear stress EF: 56%.  Defect 1: There is a medium defect of moderate severity present in the basal inferoseptal, basal inferior, mid inferior and apical inferior location. In setting of normal LVF, this is consistent with diaphragmatic attenuation artifact. No ischemia noted.  This is a low risk study.    Echo: 08/25/15: Study Conclusions  - Left ventricle: The cavity size was  normal. Wall thickness was   normal. Systolic function was normal. The estimated ejection   fraction was in the range of 60% to 65%. Wall motion was normal;   there were no regional wall motion abnormalities. - Aortic valve: Moderately calcified annulus. Moderately calcified   leaflets. There was mild stenosis. - Mitral valve: There was mild regurgitation. - Left atrium: The atrium was mildly dilated  Assessment / Plan:  1. CAD - status post 2 vessel PCI with DES in November 2013- doing well.  Myoview study in February 2017 was low risk with an area of inferior scar and no ischemia. Unchanged from 2014.he remains asymptomatic.  We will continue medical therapy and risk factor modification.   2. HTN - blood pressure is elevated today but typically under good control.  He is on multiple meds. Will monitor on current therapy. Encourage weight loss.   3. Hyponatremia secondary to chlorthalidone. Improved.  4. CKD stage 3. Last creatinine 1.14.   5. Hyperlipidemia. Intolerant of statins. Continue with  dietary modification and weight loss. Continue pravastatin. Still not at goal despite maximally tolerated dose so will add Zetia 10 mg daily. Recheck lipids in 3 months. If no better will consider PCSK 9 inhibitor.  6. Carotid arterial disease with moderate 62-70% LICA stenosis, 35-00% RICA stenosis in March 2018.  Asymptomatic. Recommend yearly follow up doppler.   7. History of mild Aortic stenosis.

## 2017-04-08 ENCOUNTER — Encounter: Payer: Self-pay | Admitting: Cardiology

## 2017-04-08 ENCOUNTER — Ambulatory Visit: Payer: BLUE CROSS/BLUE SHIELD | Admitting: Cardiology

## 2017-04-08 VITALS — BP 160/76 | HR 60 | Ht 65.5 in | Wt 228.2 lb

## 2017-04-08 DIAGNOSIS — E7800 Pure hypercholesterolemia, unspecified: Secondary | ICD-10-CM | POA: Diagnosis not present

## 2017-04-08 DIAGNOSIS — I10 Essential (primary) hypertension: Secondary | ICD-10-CM

## 2017-04-08 DIAGNOSIS — I2510 Atherosclerotic heart disease of native coronary artery without angina pectoris: Secondary | ICD-10-CM | POA: Diagnosis not present

## 2017-04-08 DIAGNOSIS — I6523 Occlusion and stenosis of bilateral carotid arteries: Secondary | ICD-10-CM | POA: Diagnosis not present

## 2017-04-08 MED ORDER — EZETIMIBE 10 MG PO TABS: 10.0000 mg | ORAL_TABLET | Freq: Every day | ORAL | 3 refills | Status: DC

## 2017-04-08 NOTE — Patient Instructions (Addendum)
Continue your current therapy  We will add Zetia 10 mg daily for your cholesterol.   We will repeat fasting lab work in 3 months

## 2017-04-08 NOTE — Addendum Note (Signed)
Addended by: Kathyrn Lass on: 04/08/2017 04:59 PM   Modules accepted: Orders

## 2017-04-11 DIAGNOSIS — M542 Cervicalgia: Secondary | ICD-10-CM | POA: Diagnosis not present

## 2017-04-11 DIAGNOSIS — G8929 Other chronic pain: Secondary | ICD-10-CM | POA: Diagnosis not present

## 2017-04-18 ENCOUNTER — Telehealth: Payer: Self-pay | Admitting: Cardiology

## 2017-04-18 MED ORDER — HYDRALAZINE HCL 50 MG PO TABS: 75.0000 mg | ORAL_TABLET | Freq: Three times a day (TID) | ORAL | 2 refills | Status: DC

## 2017-04-18 NOTE — Telephone Encounter (Signed)
Returned the call to the patient. He stated that his blood pressure has been increasing. Yesterday it was 164/78, heart rate 60 and last night it was 170/80 with a heart rate of 55. This was after the medications. Today it was 150/81  Per Dr. Martinique, the patient may increase his Hydralazine to 75 mg tid. He will continue to monitor his blood at home and call the office back with an update.

## 2017-04-18 NOTE — Telephone Encounter (Signed)
New message    Patient concerned about increased BP. Patient states he feels fine, but unsure if medication needs to be changed.  Pt c/o BP issue: STAT if pt c/o blurred vision, one-sided weakness or slurred speech  1. What are your last 5 BP readings? 150/81, 160/82, 170/80,  2. Are you having any other symptoms (ex. Dizziness, headache, blurred vision, passed out)? No  3. What is your BP issue? Concerned about higher BP

## 2017-04-30 ENCOUNTER — Other Ambulatory Visit: Payer: Self-pay | Admitting: Family Medicine

## 2017-05-12 ENCOUNTER — Other Ambulatory Visit: Payer: Self-pay | Admitting: Family Medicine

## 2017-05-12 ENCOUNTER — Other Ambulatory Visit: Payer: Self-pay | Admitting: Cardiology

## 2017-05-14 NOTE — Telephone Encounter (Signed)
Spoke to the pt.  He states he does take the medication PRN for neck and shoulder pain.  Would like a refill.  Advised that Tommi Rumps has left the office for the day but will return on 05/15/17 and will discuss at that time.

## 2017-05-14 NOTE — Telephone Encounter (Signed)
Rx has been sent to the pharmacy electronically. ° °

## 2017-05-14 NOTE — Telephone Encounter (Signed)
Does he still need this medication?

## 2017-05-15 ENCOUNTER — Telehealth: Payer: Self-pay | Admitting: Adult Health

## 2017-05-15 NOTE — Telephone Encounter (Signed)
Copied from [place]. Topic: Quick Communication - Rx Refill/Question >> May 15, 2017  1:07 PM Cecelia Byars, NT wrote: Has the patient contacted their pharmacy? no: (Agent: If no, request that the patient contact the pharmacy for the refill. [facility]erred Pharm[facility] phone [facility] [place]: Please be advised that RX refills may take up to 3 business days. We ask that you follow-up with your pharmacy. Wants boltaren gel and halobetasol propionate cream 0.05 spoke with Misty  yesterday concerning these and was told to call back with names of medication,     please call [phone]

## 2017-05-15 NOTE — Telephone Encounter (Signed)
Ok to refill + 3 

## 2017-05-15 NOTE — Telephone Encounter (Signed)
Tommi Rumps, you have not filled these medications.  Please prescribe how you want rx to read.

## 2017-05-15 NOTE — Telephone Encounter (Signed)
Ok to refill, 60 pills, 0 refills

## 2017-05-15 NOTE — Telephone Encounter (Signed)
Copied from [place]. Topic: Quick Communication - Rx Refill/Question >> May 15, 2017  1:07 PM Cecelia Byars, NT wrote: Has the patient contacted their pharmacy? no: (Agent: If no, request that the patient contact the pharmacy for the refill. [facility]erred [facility] [place]: Please be advised that RX refills may take up to 3 business days. We ask that you follow-up with your pharmacy. Wants boltaren gel and halobetasol propionate cream 0.05 spoke with Misty  yesterday concerning these and was told to call back with names of medication,     please call [phone]

## 2017-05-15 NOTE — Telephone Encounter (Signed)
Called to the pharmacy and left on machine. 

## 2017-05-16 ENCOUNTER — Other Ambulatory Visit: Payer: Self-pay | Admitting: Adult Health

## 2017-05-16 MED ORDER — DICLOFENAC SODIUM 1 % TD GEL: 2.0000 g | Freq: Four times a day (QID) | TRANSDERMAL | 3 refills | Status: DC

## 2017-05-16 MED ORDER — HALOBETASOL PROPIONATE 0.05 % EX CREA: TOPICAL_CREAM | Freq: Two times a day (BID) | CUTANEOUS | 3 refills | Status: DC

## 2017-06-02 ENCOUNTER — Ambulatory Visit: Payer: BLUE CROSS/BLUE SHIELD | Admitting: Family Medicine

## 2017-06-02 ENCOUNTER — Other Ambulatory Visit: Payer: Self-pay | Admitting: *Deleted

## 2017-06-02 ENCOUNTER — Encounter: Payer: Self-pay | Admitting: Family Medicine

## 2017-06-02 VITALS — BP 136/70 | HR 63 | Temp 98.1°F | Wt 226.8 lb

## 2017-06-02 DIAGNOSIS — L309 Dermatitis, unspecified: Secondary | ICD-10-CM

## 2017-06-02 MED ORDER — HYDROXYZINE HCL 10 MG PO TABS: 10.0000 mg | ORAL_TABLET | Freq: Four times a day (QID) | ORAL | 1 refills | Status: DC | PRN

## 2017-06-02 MED ORDER — HYDROXYZINE HCL 10 MG PO TABS: 10.0000 mg | ORAL_TABLET | Freq: Four times a day (QID) | ORAL | 0 refills | Status: DC | PRN

## 2017-06-02 MED ORDER — METHYLPREDNISOLONE ACETATE 40 MG/ML IJ SUSP
120.0000 mg | Freq: Once | INTRAMUSCULAR | Status: AC
  Administered 2017-06-02: 120 mg via INTRAMUSCULAR

## 2017-06-02 NOTE — Progress Notes (Signed)
   Subjective:    Patient ID: Todd Spears, male    DOB: November 19, 1945, 71 y.o.   MRN: [id number]  HPI Here for 10 days of an itchy rash on the arms, thighs, and neck. He is using Halobetasol cream bid and using Aveeno moisturizer lotion several times a day. He takes Claritin daily for allergies.    Review of Systems  Constitutional: Negative.   Respiratory: Negative.   Cardiovascular: Negative.   Skin: Positive for rash.       Objective:   Physical Exam  Constitutional: He appears well-developed and well-nourished.  Cardiovascular: Normal rate, regular rhythm, normal heart sounds and intact distal pulses.  Pulmonary/Chest: Effort normal and breath sounds normal. No respiratory distress. He has no wheezes. He has no rales.  Skin:  Scattered areas of red scaly skin as above, many areas are excoriated           Assessment & Plan:  Eczema. We will give him a steroid shot today and he can use Hydroxyzine as needed. Continue Aveeno and Halobetasol cream. Use a vaporizor in the bedroom at night for moisture.  Alysia Penna, MD

## 2017-06-02 NOTE — Addendum Note (Signed)
Addended by: Myriam Forehand on: 06/02/2017 01:48 PM   Modules accepted: Orders

## 2017-06-05 ENCOUNTER — Other Ambulatory Visit: Payer: Self-pay

## 2017-06-05 DIAGNOSIS — M109 Gout, unspecified: Secondary | ICD-10-CM | POA: Diagnosis not present

## 2017-06-05 DIAGNOSIS — E669 Obesity, unspecified: Secondary | ICD-10-CM | POA: Diagnosis not present

## 2017-06-05 DIAGNOSIS — I2510 Atherosclerotic heart disease of native coronary artery without angina pectoris: Secondary | ICD-10-CM | POA: Diagnosis not present

## 2017-06-05 DIAGNOSIS — I129 Hypertensive chronic kidney disease with stage 1 through stage 4 chronic kidney disease, or unspecified chronic kidney disease: Secondary | ICD-10-CM | POA: Diagnosis not present

## 2017-06-05 DIAGNOSIS — D631 Anemia in chronic kidney disease: Secondary | ICD-10-CM | POA: Diagnosis not present

## 2017-06-05 DIAGNOSIS — N183 Chronic kidney disease, stage 3 (moderate): Secondary | ICD-10-CM | POA: Diagnosis not present

## 2017-06-05 MED ORDER — HYDRALAZINE HCL 50 MG PO TABS: 75.0000 mg | ORAL_TABLET | Freq: Three times a day (TID) | ORAL | 2 refills | Status: DC

## 2017-06-09 ENCOUNTER — Other Ambulatory Visit: Payer: Self-pay | Admitting: Family Medicine

## 2017-06-20 ENCOUNTER — Telehealth: Payer: Self-pay | Admitting: Cardiology

## 2017-06-20 MED ORDER — HYDRALAZINE HCL 50 MG PO TABS: 75.0000 mg | ORAL_TABLET | Freq: Three times a day (TID) | ORAL | 3 refills | Status: DC

## 2017-06-20 NOTE — Telephone Encounter (Signed)
New Message      *STAT* If patient is at the pharmacy, call can be transferred to refill team.   1. Which medications need to be refilled? (please list name of each medication and dose if known) needs new script sent for the high dosage  hydrALAZINE (APRESOLINE) 50 MG tablet Take 1.5 tablets (75 mg total) by mouth 3 (three) times daily.     2. Which pharmacy/location (including street and city if local pharmacy) is medication to be sent to? [facility]   3. Do they need a 30 day or 90 day supply?  [place]

## 2017-06-20 NOTE — Telephone Encounter (Signed)
Refill sent to the pharmacy electronically.  

## 2017-06-26 ENCOUNTER — Telehealth: Payer: Self-pay

## 2017-06-26 NOTE — Telephone Encounter (Signed)
[facility]   Pt is requesting 90 day supply for hydroxyz HCL tab 10 MG   Sent to [facility] pt

## 2017-06-27 NOTE — Telephone Encounter (Signed)
This was routed to pre-op as below; I think that was an error, will remove from PA queue. Dayna Dunn [name]

## 2017-06-27 NOTE — Telephone Encounter (Signed)
We don't have this listed on his medication list. He should get this from primary care  Peter Martinique MD, [facility]

## 2017-06-27 NOTE — Telephone Encounter (Signed)
Spoke to [facility] Hydroxyzine already sent to patient.

## 2017-07-01 ENCOUNTER — Ambulatory Visit: Payer: BLUE CROSS/BLUE SHIELD | Admitting: Podiatry

## 2017-07-01 ENCOUNTER — Encounter: Payer: Self-pay | Admitting: Podiatry

## 2017-07-01 DIAGNOSIS — M216X9 Other acquired deformities of unspecified foot: Secondary | ICD-10-CM | POA: Diagnosis not present

## 2017-07-01 DIAGNOSIS — B351 Tinea unguium: Secondary | ICD-10-CM | POA: Diagnosis not present

## 2017-07-01 DIAGNOSIS — M79676 Pain in unspecified toe(s): Secondary | ICD-10-CM

## 2017-07-01 DIAGNOSIS — Q828 Other specified congenital malformations of skin: Secondary | ICD-10-CM | POA: Diagnosis not present

## 2017-07-01 NOTE — Progress Notes (Signed)
Patient ID: Todd Spears, male   DOB: 11-19-1945, 72 y.o.   MRN: [id number] Complaint:  Visit Type: Patient returns to my office for continued preventative foot care services. Complaint: Patient states" my nails have grown long and thick and become painful to walk and wear shoes". The patient presents for preventative foot care services. No changes to ROS.  Painful callus under the outside ball of both feet.  Podiatric Exam: Vascular: dorsalis pedis and posterior tibial pulses are palpable bilateral. Capillary return is immediate. Temperature gradient is WNL. Skin turgor WNL  Sensorium: Normal Semmes Weinstein monofilament test. Normal tactile sensation bilaterally. Nail Exam: Pt has thick disfigured discolored nails with subungual debris noted bilateral entire nail hallux through fifth toenails Ulcer Exam: There is no evidence of ulcer or pre-ulcerative changes or infection. Orthopedic Exam: Muscle tone and strength are WNL. No limitations in general ROM. No crepitus or effusions noted. HAV left with overlapping second.  HAV right foot. Skin:  Porokeratosis sub 5th metatarsal head both feet.. No infection or ulcers  Diagnosis:  Onychomycosis, , Pain in right toe, pain in left toes. Porokeratosis B/L  Treatment & Plan Procedures and Treatment: Consent by patient was obtained for treatment procedures. The patient understood the discussion of treatment and procedures well. All questions were answered thoroughly reviewed. Debridement of mycotic and hypertrophic toenails, 1 through 5 bilateral and clearing of subungual debris. No ulceration, no infection noted. Debride porokeratosis Return Visit-Office Procedure: Patient instructed to return to the office for a follow up visit 3 months for continued evaluation and treatment.  Gardiner Barefoot DPM

## 2017-07-09 ENCOUNTER — Telehealth: Payer: Self-pay | Admitting: Cardiology

## 2017-07-09 DIAGNOSIS — I6523 Occlusion and stenosis of bilateral carotid arteries: Secondary | ICD-10-CM | POA: Diagnosis not present

## 2017-07-09 DIAGNOSIS — E7800 Pure hypercholesterolemia, unspecified: Secondary | ICD-10-CM | POA: Diagnosis not present

## 2017-07-09 DIAGNOSIS — I2510 Atherosclerotic heart disease of native coronary artery without angina pectoris: Secondary | ICD-10-CM | POA: Diagnosis not present

## 2017-07-09 DIAGNOSIS — I10 Essential (primary) hypertension: Secondary | ICD-10-CM | POA: Diagnosis not present

## 2017-07-09 LAB — HEPATIC FUNCTION PANEL
ALBUMIN: 4.1 g/dL (ref 3.5–4.8)
ALT: 33 IU/L (ref 0–44)
AST: 30 IU/L (ref 0–40)
Alkaline Phosphatase: 48 IU/L (ref 39–117)
BILIRUBIN TOTAL: 0.6 mg/dL (ref 0.0–1.2)
Bilirubin, Direct: 0.16 mg/dL (ref 0.00–0.40)
TOTAL PROTEIN: 6.5 g/dL (ref 6.0–8.5)

## 2017-07-09 LAB — LIPID PANEL W/O CHOL/HDL RATIO
Cholesterol, Total: 147 mg/dL (ref 100–199)
HDL: 63 mg/dL (ref >39)
LDL Calculated: 62 mg/dL (ref 0–99)
Triglycerides: 112 mg/dL (ref 0–149)
VLDL CHOLESTEROL CAL: 22 mg/dL (ref 5–40)

## 2017-07-09 NOTE — Telephone Encounter (Signed)
Pt's BP Is running high since Monday.  187/83 - he has some chest discomfort but very mild, may be from BP.  He will increase Hydralazine to  100 mg every 8 hours and he has appt with Almyra Deforest, PA on the 18th, though if no improvement he will call, or if chest discomfort increases he will go to ER.   Will send not to Dr. Martinique as well.

## 2017-07-11 ENCOUNTER — Telehealth: Payer: Self-pay | Admitting: Cardiology

## 2017-07-11 NOTE — Telephone Encounter (Signed)
Returned call to patient unable to talk.Todd Spears.

## 2017-07-11 NOTE — Telephone Encounter (Signed)
Todd Spears is returning your call , Please call on his cell [phone]

## 2017-07-11 NOTE — Telephone Encounter (Signed)
Returned call to patient.Lab results given.He stated he called PA on call this past Monday night.Stated B/P elevated.She increased Hydralazine to 100 mg 3 times a day.B/P is improving today 146/66.He wanted Dr.Jordan to know.Advised Dr.Jordan out of office today.I will let him know next week.

## 2017-07-11 NOTE — Telephone Encounter (Signed)
New Message ° ° °Patient is returning call in reference to labs. Please call to discuss.  °

## 2017-07-18 ENCOUNTER — Other Ambulatory Visit: Payer: Self-pay

## 2017-07-21 ENCOUNTER — Ambulatory Visit: Payer: BLUE CROSS/BLUE SHIELD | Admitting: Physician Assistant

## 2017-07-30 ENCOUNTER — Other Ambulatory Visit: Payer: Self-pay | Admitting: Cardiology

## 2017-07-30 DIAGNOSIS — I6523 Occlusion and stenosis of bilateral carotid arteries: Secondary | ICD-10-CM

## 2017-07-31 ENCOUNTER — Other Ambulatory Visit: Payer: Self-pay | Admitting: Adult Health

## 2017-07-31 DIAGNOSIS — J309 Allergic rhinitis, unspecified: Secondary | ICD-10-CM

## 2017-08-12 ENCOUNTER — Ambulatory Visit (HOSPITAL_COMMUNITY)
Admission: RE | Admit: 2017-08-12 | Discharge: 2017-08-12 | Disposition: A | Discharge status: Home / Self Care | Payer: BLUE CROSS/BLUE SHIELD | Source: Ambulatory Visit | Attending: Cardiovascular Disease | Admitting: Cardiovascular Disease

## 2017-08-12 DIAGNOSIS — I6523 Occlusion and stenosis of bilateral carotid arteries: Secondary | ICD-10-CM | POA: Insufficient documentation

## 2017-08-14 ENCOUNTER — Other Ambulatory Visit: Payer: Self-pay

## 2017-08-14 DIAGNOSIS — I739 Peripheral vascular disease, unspecified: Principal | ICD-10-CM

## 2017-08-14 DIAGNOSIS — I779 Disorder of arteries and arterioles, unspecified: Secondary | ICD-10-CM

## 2017-08-14 NOTE — Progress Notes (Signed)
carotids

## 2017-08-15 ENCOUNTER — Other Ambulatory Visit: Payer: Self-pay

## 2017-08-15 MED ORDER — PRAVASTATIN SODIUM 10 MG PO TABS: 10.0000 mg | ORAL_TABLET | Freq: Every day | ORAL | 1 refills | Status: DC

## 2017-08-15 NOTE — Telephone Encounter (Signed)
REFILL 

## 2017-08-26 ENCOUNTER — Other Ambulatory Visit: Payer: Self-pay | Admitting: Family Medicine

## 2017-08-26 NOTE — Telephone Encounter (Signed)
Todd Spears 

## 2017-08-27 NOTE — Telephone Encounter (Signed)
Sent to the pharmacy by e-scribe. 

## 2017-09-21 ENCOUNTER — Other Ambulatory Visit: Payer: Self-pay | Admitting: Adult Health

## 2017-09-23 DIAGNOSIS — H6123 Impacted cerumen, bilateral: Secondary | ICD-10-CM | POA: Diagnosis not present

## 2017-09-23 NOTE — Telephone Encounter (Signed)
Sent to the pharmacy by e-scribe. 

## 2017-09-24 ENCOUNTER — Encounter: Payer: Self-pay | Admitting: Cardiology

## 2017-09-30 ENCOUNTER — Ambulatory Visit: Payer: BLUE CROSS/BLUE SHIELD | Admitting: Podiatry

## 2017-10-04 NOTE — Progress Notes (Signed)
Todd Spears Date of Birth: August 28, 1945 Medical Record #[id number]  History of Present Illness: Todd Spears is seen back today for follow up CAD. He is s/p 2 vessel PCI with Promus DES for 90% mid LAD and 95% mid RCA disease on 04/06/2012. His EF is normal at 55%. At that time he really had no significant anginal symptoms but had an abnormal stress test.  Myoview in Nov. 2014 showed a fixed inferior defect without ischemia. Last Myoview in Feb. 2017 was unchanged. Other issues include CKD stage 3, HTN, obesity, gout,,OA, ED, carotid bruit and asthma.   He was admitted in January 2017 with tremors and hyponatremia. Chlorthalidone was discontinued.   He had carotid dopplers showing 1-39% RICA and 67-67% LICA stenoses. Echo done in March 2017 showed normal LV function and mild AS unchanged from 2013. He has been followed in our lipid clinic for hypercholesterolemia with prior intolerance to statins. He is now on pravastatin 10 mg alternating with 20 mg daily. He is doing OK with this regimen but sometimes will start having more cramps so he will back off. On his last visit we started him on Zetia and he is tolerating this well.   He denies any chest pain or SOB. No TIA or CVA symptoms. 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 BP at home averages 140/70/     Current Outpatient Medications on File Prior to Visit  Medication Sig Dispense Refill  . albuterol (PROAIR HFA) 108 (90 Base) MCG/ACT inhaler INHALE 2 PUFFS INTO THE LUNGS EVERY 6 (SIX) HOURS AS NEEDED. FOR SHORTNESS OF BREATH 1 Inhaler 2  . amLODipine (NORVASC) 10 MG tablet TAKE 1 TABLET DAILY 90 tablet 1  . aspirin 81 MG chewable tablet Chew 81 mg by mouth daily.    . cetirizine (ZYRTEC) 10 MG tablet Take 10 mg by mouth every morning.    Marland Kitchen COLCRYS 0.6 MG tablet Take 0.6 mg by mouth daily as needed (for gout flareups). Reported on 10/26/2015    . Cyanocobalamin (VITAMIN B 12 PO) Take 1 tablet by mouth daily.      . diclofenac sodium (VOLTAREN) 1 % GEL Apply 2 g topically 4 (four) times daily. 100 g 3  . esomeprazole (NEXIUM) 40 MG capsule TAKE 1 CAPSULE DAILY BEFOREBREAKFAST 90 capsule 2  . ezetimibe (ZETIA) 10 MG tablet Take 1 tablet (10 mg total) daily by mouth. 90 tablet 3  . FeFum-FePoly-FA-B Cmp-C-Biot (INTEGRA PLUS) CAPS Take 1 tablet by mouth daily.    . halobetasol (ULTRAVATE) 0.05 % cream Apply topically 2 (two) times daily. 50 g 3  . hydrALAZINE (APRESOLINE) 50 MG tablet Take 1.5 tablets (75 mg total) by mouth 3 (three) times daily. 405 tablet 3  . hydrOXYzine (ATARAX/VISTARIL) 10 MG tablet Take 1 tablet (10 mg total) by mouth every 6 (six) hours as needed for itching. 270 tablet 1  . lisinopril (PRINIVIL,ZESTRIL) 20 MG tablet TAKE 1 TABLET TWICE A DAY 180 tablet 1  . loratadine (CLARITIN) 10 MG tablet Take 10 mg by mouth at bedtime.     . metoprolol succinate (TOPROL-XL) 50 MG 24 hr tablet TAKE 1 TABLET DAILY WITH ORIMMEDIATELY FOLLOWING A    MEAL 90 tablet 2  . mometasone (NASONEX) 50 MCG/ACT nasal spray Place 1-2 sprays into the nose as needed.    . montelukast (SINGULAIR) 10 MG tablet TAKE 1 TABLET BY MOUTH AT BEDTIME 90 tablet 3  . Multiple  Vitamin (MULTIVITAMIN WITH MINERALS) TABS tablet Take 1 tablet by mouth daily.    . Multiple Vitamins-Minerals (VISION-VITE PRESERVE PO) Take 1 tablet by mouth 2 (two) times daily.     . nitroGLYCERIN (NITROSTAT) 0.4 MG SL tablet Place 1 tablet (0.4 mg total) under the tongue every 5 (five) minutes as needed for chest pain (up to 3 doses. not within 24hrs of Viagra). 25 tablet 6  . pravastatin (PRAVACHOL) 10 MG tablet Take 1 tablet (10 mg total) by mouth daily. 90 tablet 1  . Saline (SIMPLY SALINE) 0.9 % AERS Place 1 spray into the nose daily as needed (congestion).     . traMADol (ULTRAM) 50 MG tablet TAKE 1 TABLET EVERY 12 HOURS 60 tablet 0  . ULORIC 80 MG TABS Take 1 tablet by mouth daily. Reported on 10/26/2015     No current facility-administered  medications on file prior to visit.     Allergies  Allergen Reactions  . Prednisone Shortness Of Breath and Swelling  . Crestor [Rosuvastatin Calcium] Other (See Comments)    myalgia  . Lyrica [Pregabalin]     "swelling"    Past Medical History:  Diagnosis Date  . Allergic rhinitis   . Anemia   . Asthma   . CAD (coronary artery disease)    a. s/p PTCA/stenting of the mid LAD and mid RCA 04/06/12  . Carotid bruit    r  . Colon polyp   . Diverticulosis   . ED (erectile dysfunction)   . Fatty liver   . GERD (gastroesophageal reflux disease)   . Gout   . Hyperlipidemia   . Hypertension   . Obesity   . Osteoarthritis   . Renal insufficiency   . Restless leg syndrome     Past Surgical History:  Procedure Laterality Date  . COLONOSCOPY    . INGUINAL HERNIA REPAIR Right 02/27/2016   Procedure: HERNIA REPAIR INGUINAL ADULT;  Surgeon: Rolm Bookbinder, MD;  Location: [facility];  Service: General;  Laterality: Right;  . INSERTION OF MESH Right 02/27/2016   Procedure: INSERTION OF MESH--IN AND OUT CATHETERIZATION PERFORMED AT END OF PROCEDURE TO DRAIN 400ML URINE.;  Surgeon: Rolm Bookbinder, MD;  Location: [facility];  Service: General;  Laterality: Right;  . NM PET LYMPHOMA     left side of neck 1999  . PERCUTANEOUS CORONARY STENT INTERVENTION (PCI-S) N/A 04/06/2012   Procedure: PERCUTANEOUS CORONARY STENT INTERVENTION (PCI-S);  Surgeon: Tiari Andringa M Martinique, MD; Promus DES for 90% mid LAD and 95% mid RCA disease      Social History   Tobacco Use  Smoking Status Former Smoker  . Last attempt to quit: 06/04/1975  . Years since quitting: 42.3  Smokeless Tobacco Never Used    Social History   Substance and Sexual Activity  Alcohol Use No  . Alcohol/week: 0.0 oz    Family History  Problem Relation Age of Onset  . Coronary artery disease Father        in his 51s  . Heart disease Father   . Heart attack Father   . Hypertension Mother   .  Stroke Mother        Several   . Stomach cancer Mother   . Glaucoma Mother   . Hypertension Paternal Grandmother   . Hypertension Paternal Grandfather   . Colon cancer Neg Hx   . Esophageal cancer Neg Hx   . Rectal cancer Neg Hx     Review of Systems: The  review of systems is per the HPI.  All other systems were reviewed and are negative.  Physical Exam: BP (!) 156/68 (BP Location: Left Arm, Patient Position: Sitting, Cuff Size: Normal)   Pulse 61   Ht 5' 5.5" (1.664 m)   Wt 226 lb (102.5 kg)   BMI 37.04 kg/m  GENERAL:  Well appearing, obese WM  HEENT:  PERRL, EOMI, sclera are clear. Oropharynx is clear. NECK:  No jugular venous distention, carotid upstroke brisk and symmetric, no bruits, no thyromegaly or adenopathy LUNGS:  Clear to auscultation bilaterally CHEST:  Unremarkable HEART:  RRR,  PMI not displaced or sustained,S1 and S2 within normal limits, no S3, no S4: no clicks, no rubs, no murmurs ABD:  Soft, nontender. BS +, no masses or bruits. No hepatomegaly, no splenomegaly EXT:  2 + pulses throughout, no edema, no cyanosis no clubbing SKIN:  Warm and dry.  No rashes NEURO:  Alert and oriented x 3. Cranial nerves II through XII intact. PSYCH:  Cognitively intact      LABORATORY DATA: Lab Results  Component Value Date   WBC 6.0 03/18/2017   HGB 12.2 (L) 03/18/2017   HCT 36.6 (L) 03/18/2017   PLT 237.0 03/18/2017   GLUCOSE 116 (H) 03/18/2017   CHOL 147 07/09/2017   TRIG 112 07/09/2017   HDL 63 07/09/2017   LDLDIRECT 164.9 12/10/2010   LDLCALC 62 07/09/2017   ALT 33 07/09/2017   AST 30 07/09/2017   NA 138 03/18/2017   K 4.6 03/18/2017   CL 101 03/18/2017   CREATININE 1.14 03/18/2017   BUN 17 03/18/2017   CO2 28 03/18/2017   TSH 3.25 03/18/2017   PSA 0.90 03/18/2017   INR 1.0 04/03/2012   HGBA1C 5.6 10/26/2015      Lexiscan myoview 07/28/15: Study Highlights     There was no ST segment deviation noted during stress.  The left ventricular  ejection fraction is normal (55-65%).  Nuclear stress EF: 56%.  Defect 1: There is a medium defect of moderate severity present in the basal inferoseptal, basal inferior, mid inferior and apical inferior location. In setting of normal LVF, this is consistent with diaphragmatic attenuation artifact. No ischemia noted.  This is a low risk study.    Addendum by Sueanne Margarita, MD on Fri Jul 28, 2015 2:03 PM    There was no ST segment deviation noted during stress.  The left ventricular ejection fraction is normal (55-65%).  Nuclear stress EF: 56%.  Defect 1: There is a medium defect of moderate severity present in the basal inferoseptal, basal inferior, mid inferior and apical inferior location. In setting of normal LVF, this is consistent with diaphragmatic attenuation artifact. No ischemia noted.  This is a low risk study.    Echo: 08/25/15: Study Conclusions  - Left ventricle: The cavity size was normal. Wall thickness was   normal. Systolic function was normal. The estimated ejection   fraction was in the range of 60% to 65%. Wall motion was normal;   there were no regional wall motion abnormalities. - Aortic valve: Moderately calcified annulus. Moderately calcified   leaflets. There was mild stenosis. - Mitral valve: There was mild regurgitation. - Left atrium: The atrium was mildly dilated  Carotid dopplers 08/12/17: bilateral 40-59% ICA stenosis.   Assessment / Plan:  1. CAD - status post 2 vessel PCI with DES in November 2013- doing well.  Myoview study in February 2017 was low risk with an area of inferior scar and  no ischemia. Unchanged from 2014.he remains asymptomatic.  We will continue medical therapy and risk factor modification.   2. HTN - blood pressure is elevated today but generally under fair control.  He is on multiple meds. Will monitor on current therapy. Encourage weight loss.   3. Hyponatremia secondary to chlorthalidone. Improved.  4. CKD stage 3.    5. Hyperlipidemia. Intolerant of statins at higher doses. Continue with  dietary modification and weight loss. Continue pravastatin. Excellent response to addition of Zetia. Now at goal.  6. Carotid arterial disease with moderate 07-61% LICA stenosis, 51-83% RICA stenosis in March 2019.  Asymptomatic. Recommend yearly follow up doppler.   7. History of mild Aortic stenosis.

## 2017-10-07 ENCOUNTER — Ambulatory Visit (INDEPENDENT_AMBULATORY_CARE_PROVIDER_SITE_OTHER): Payer: BLUE CROSS/BLUE SHIELD | Admitting: Cardiology

## 2017-10-07 ENCOUNTER — Encounter: Payer: Self-pay | Admitting: Cardiology

## 2017-10-07 VITALS — BP 156/68 | HR 61 | Ht 65.5 in | Wt 226.0 lb

## 2017-10-07 DIAGNOSIS — I10 Essential (primary) hypertension: Secondary | ICD-10-CM

## 2017-10-07 DIAGNOSIS — I2510 Atherosclerotic heart disease of native coronary artery without angina pectoris: Secondary | ICD-10-CM | POA: Diagnosis not present

## 2017-10-07 DIAGNOSIS — E7800 Pure hypercholesterolemia, unspecified: Secondary | ICD-10-CM | POA: Diagnosis not present

## 2017-10-07 NOTE — Patient Instructions (Addendum)
Continue your current therapy  Work on walking more and losing weight  I will see you 6 months

## 2017-11-09 ENCOUNTER — Other Ambulatory Visit: Payer: Self-pay | Admitting: Cardiology

## 2017-11-10 ENCOUNTER — Other Ambulatory Visit: Payer: Self-pay | Admitting: Family Medicine

## 2017-11-10 NOTE — Telephone Encounter (Signed)
Cory pt 

## 2017-11-11 NOTE — Telephone Encounter (Signed)
Sent to the pharmacy by e-scribe. 

## 2017-11-17 ENCOUNTER — Other Ambulatory Visit: Payer: Self-pay | Admitting: Cardiology

## 2017-12-30 ENCOUNTER — Other Ambulatory Visit: Payer: Self-pay

## 2017-12-30 ENCOUNTER — Ambulatory Visit: Payer: BLUE CROSS/BLUE SHIELD | Admitting: Podiatry

## 2017-12-30 MED ORDER — HYDRALAZINE HCL 50 MG PO TABS: 75.0000 mg | ORAL_TABLET | Freq: Three times a day (TID) | ORAL | 1 refills | Status: DC

## 2018-01-28 IMAGING — DX DG CHEST 2V
2 series · 2 of 2 positions shown · non-contrast
Comparison: Chest radiograph performed 04/03/2012

CLINICAL DATA: Acute onset of palpitations and tremors. Initial
encounter.

EXAM:
CHEST  2 VIEW

[chest pa]
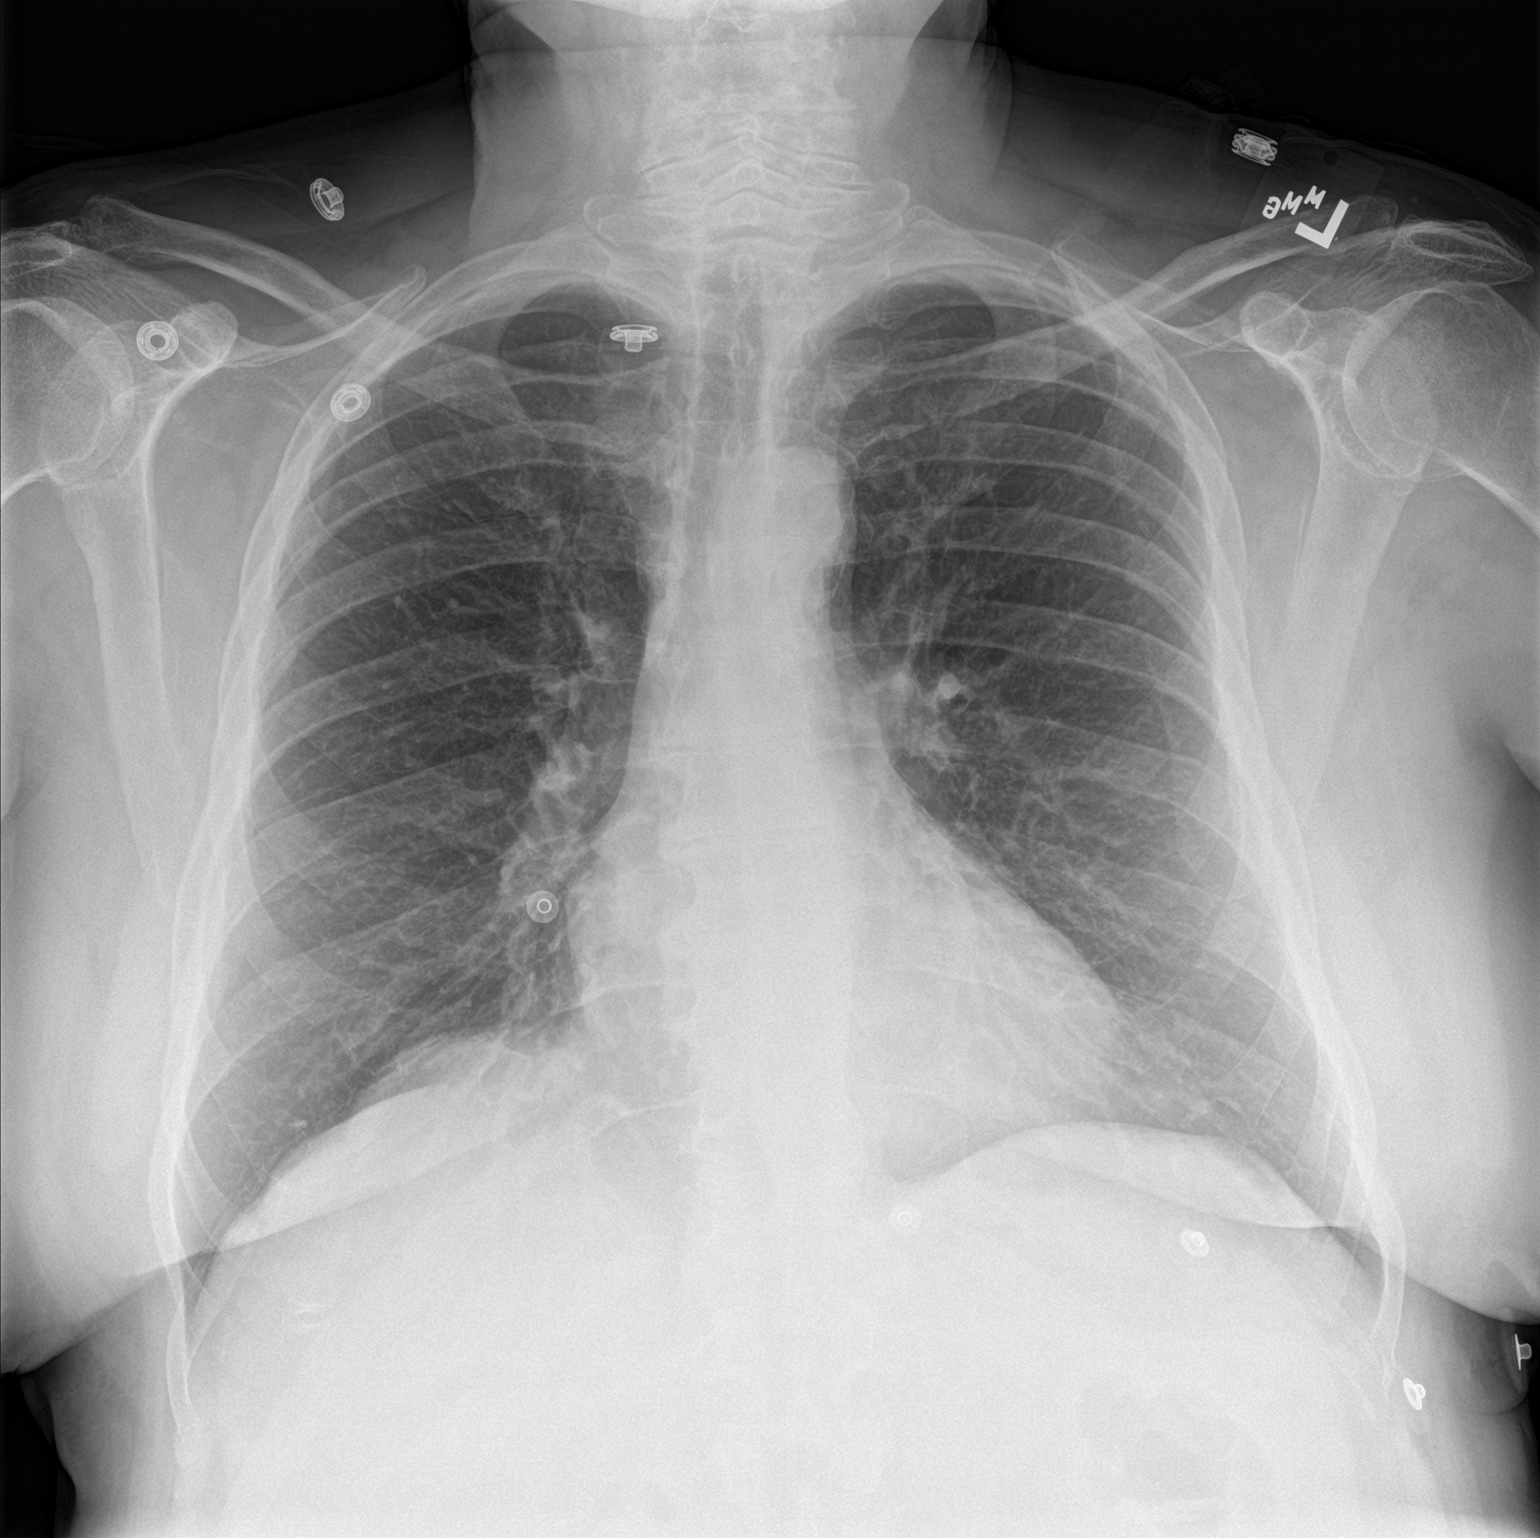

[chest lat]
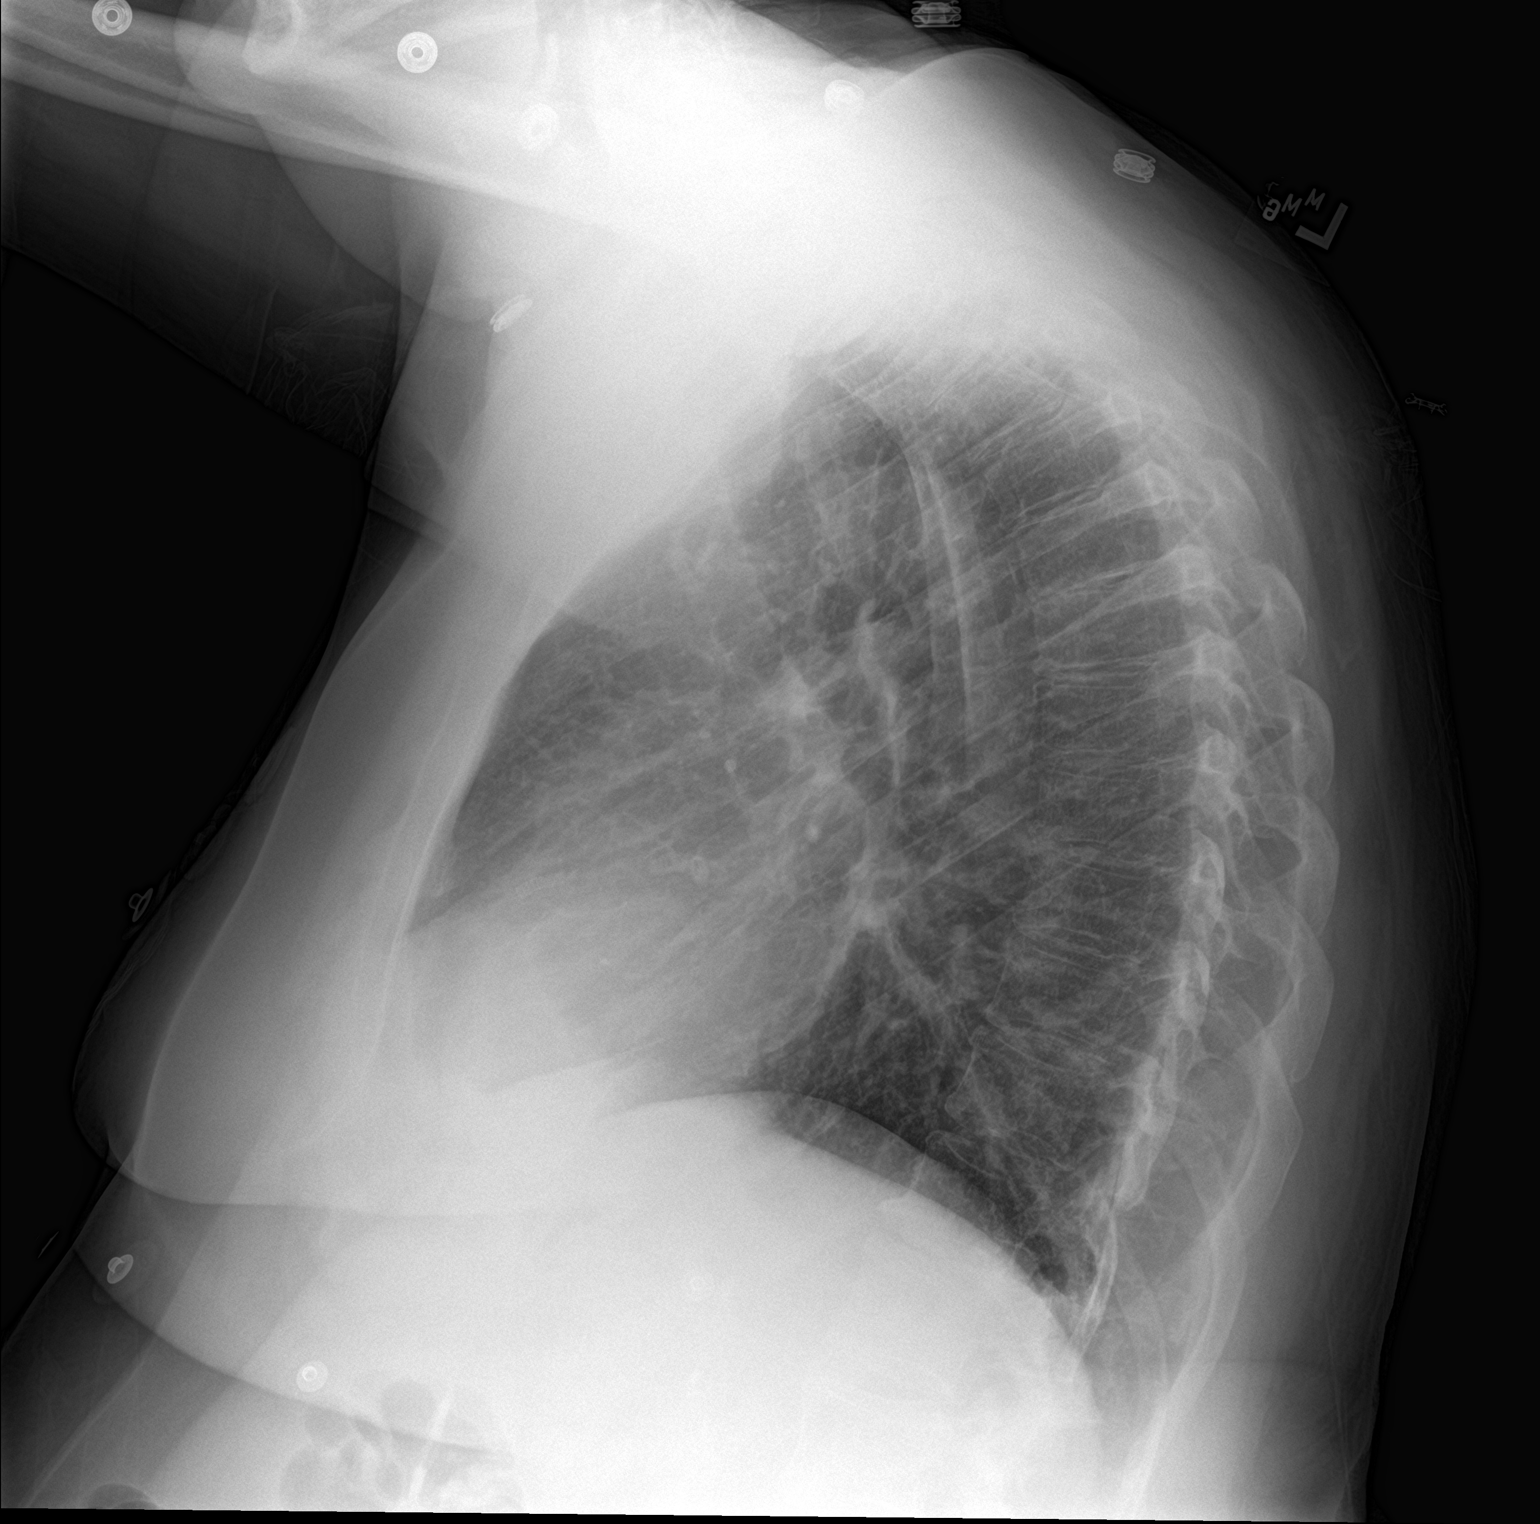

[2 of 2 positions shown; findings below may reference images not displayed]

FINDINGS: The lungs are well-aerated. Minimal left basilar atelectasis is
noted. There is no evidence of pleural effusion or pneumothorax.

The heart is normal in size; the mediastinal contour is within
normal limits. No acute osseous abnormalities are seen.
IMPRESSION: Minimal left basilar atelectasis noted.  Lungs otherwise clear.

## 2018-02-04 ENCOUNTER — Ambulatory Visit: Payer: BLUE CROSS/BLUE SHIELD | Admitting: Podiatry

## 2018-02-04 ENCOUNTER — Encounter: Payer: Self-pay | Admitting: Podiatry

## 2018-02-04 ENCOUNTER — Encounter

## 2018-02-04 DIAGNOSIS — B351 Tinea unguium: Secondary | ICD-10-CM | POA: Diagnosis not present

## 2018-02-04 DIAGNOSIS — M79676 Pain in unspecified toe(s): Secondary | ICD-10-CM

## 2018-02-04 NOTE — Progress Notes (Signed)
Patient ID: Todd Spears, male   DOB: June 02, 1946, 72 y.o.   MRN: [id number] Complaint:  Visit Type: Patient returns to my office for continued preventative foot care services. Complaint: Patient states" my nails have grown long and thick and become painful to walk and wear shoes". The patient presents for preventative foot care services. No changes to ROS.  Painful callus under the outside ball of both feet.  Podiatric Exam: Vascular: dorsalis pedis and posterior tibial pulses are palpable bilateral. Capillary return is immediate. Temperature gradient is WNL. Skin turgor WNL  Sensorium: Normal Semmes Weinstein monofilament test. Normal tactile sensation bilaterally. Nail Exam: Pt has thick disfigured discolored nails with subungual debris noted bilateral entire nail hallux through fifth toenails Ulcer Exam: There is no evidence of ulcer or pre-ulcerative changes or infection. Orthopedic Exam: Muscle tone and strength are WNL. No limitations in general ROM. No crepitus or effusions noted. HAV left with overlapping second.  HAV right foot. Skin:  Porokeratosis sub 5th metatarsal head both feet.. No infection or ulcers  Diagnosis:  Onychomycosis, , Pain in right toe, pain in left toes. Porokeratosis B/L  Treatment & Plan Procedures and Treatment: Consent by patient was obtained for treatment procedures. The patient understood the discussion of treatment and procedures well. All questions were answered thoroughly reviewed. Debridement of mycotic and hypertrophic toenails, 1 through 5 bilateral and clearing of subungual debris. No ulceration, no infection noted.  Return Visit-Office Procedure: Patient instructed to return to the office for a follow up visit 3 months for continued evaluation and treatment.  Gardiner Barefoot DPM

## 2018-02-05 ENCOUNTER — Telehealth: Payer: Self-pay | Admitting: Adult Health

## 2018-02-05 NOTE — Telephone Encounter (Signed)
Copied from [place] [id number]. Topic: Quick Communication - Rx Refill/Question >> Feb 05, 2018 10:33 AM Mylinda Latina, NT wrote: Medication: metoprolol succinate (TOPROL-XL) 50 MG 24 hr tablet  Has the patient contacted their pharmacy? No.pharmacy called to request medication  (Agent: If no, request that the patient contact the pharmacy for the refill.) (Agent: If yes, when and what did the pharmacy say  Preferred Pharmacy (with phone number or street name): [facility], [place] to [facility] [facility] [phone] (Phone) [phone] (Fax)    Agent: Please be advised that RX refills may take up to 3 business days. We ask that you follow-up with your pharmacy.

## 2018-02-05 NOTE — Telephone Encounter (Signed)
Cory, You have not prescribed this medication in the past.  Please advise.  Pt due in Oct for CPX.

## 2018-02-05 NOTE — Telephone Encounter (Signed)
Ok for 90 days  

## 2018-02-06 MED ORDER — METOPROLOL SUCCINATE ER 50 MG PO TB24: ORAL_TABLET | ORAL | 0 refills | Status: DC

## 2018-02-06 NOTE — Telephone Encounter (Signed)
Sent to the pharmacy by e-scribe. 

## 2018-02-25 ENCOUNTER — Encounter: Payer: Self-pay | Admitting: Adult Health

## 2018-02-25 ENCOUNTER — Ambulatory Visit (INDEPENDENT_AMBULATORY_CARE_PROVIDER_SITE_OTHER): Payer: BLUE CROSS/BLUE SHIELD | Admitting: Adult Health

## 2018-02-25 VITALS — BP 150/50 | HR 60 | Temp 98.1°F | Wt 229.8 lb

## 2018-02-25 DIAGNOSIS — H6121 Impacted cerumen, right ear: Secondary | ICD-10-CM

## 2018-02-25 DIAGNOSIS — J0141 Acute recurrent pansinusitis: Secondary | ICD-10-CM | POA: Diagnosis not present

## 2018-02-25 MED ORDER — DOXYCYCLINE HYCLATE 100 MG PO CAPS: 100.0000 mg | ORAL_CAPSULE | Freq: Two times a day (BID) | ORAL | 0 refills | Status: DC

## 2018-02-25 MED ORDER — MONTELUKAST SODIUM 10 MG PO TABS: 10.0000 mg | ORAL_TABLET | Freq: Every day | ORAL | 3 refills | Status: DC

## 2018-02-25 NOTE — Progress Notes (Signed)
   Subjective:    Patient ID: [name], male    DOB: [birth year], 72 y.o.   MRN: [id number]  URI   This is a new problem. Episode onset: 3 weeks  The problem has been waxing and waning. There has been no fever. Associated symptoms include congestion, headaches, a plugged ear sensation, rhinorrhea and sinus pain. Pertinent negatives include no coughing, ear pain or sore throat. Treatments tried: Zyrtec, Claritin, Flonase, Nasal Spray  The treatment provided no relief.     Review of Systems  Constitutional: Positive for fatigue.  HENT: Positive for congestion, postnasal drip, rhinorrhea, sinus pressure and sinus pain. Negative for ear discharge, ear pain, sore throat, trouble swallowing and voice change.   Respiratory: Negative.  Negative for cough.   Cardiovascular: Negative.   Musculoskeletal: Negative.   Neurological: Positive for headaches.       Objective:   Physical Exam  Constitutional: He is oriented to person, place, and time. He appears well-developed and well-nourished. No distress.  HENT:  Head: Normocephalic and atraumatic.  Left Ear: Hearing, tympanic membrane, external ear and ear canal normal.  Nose: Mucosal edema present. No rhinorrhea. Right sinus exhibits maxillary sinus tenderness and frontal sinus tenderness. Left sinus exhibits maxillary sinus tenderness and frontal sinus tenderness.  Mouth/Throat: Uvula is midline, oropharynx is clear and moist and mucous membranes are normal.  Cerumen impaction in right ear canal    Cardiovascular: Normal rate, regular rhythm, normal heart sounds and intact distal pulses.  Pulmonary/Chest: Effort normal and breath sounds normal.  Neurological: He is alert and oriented to person, place, and time.  Skin: Skin is warm and dry. He is not diaphoretic.  Psychiatric: He has a normal mood and affect. His behavior is normal. Judgment and thought content normal.  Nursing note and vitals reviewed.     Assessment & Plan:  1. Acute  recurrent pansinusitis - Will treat due to symptoms. Follow up in 2-3 days if no improvement  - doxycycline (VIBRAMYCIN) 100 MG capsule; Take 1 capsule (100 mg total) by mouth 2 (two) times daily.  Dispense: 14 capsule; Refill: 0  2. Impacted cerumen of right ear - Cerumen impaction was easily removed with irrgation.  Patient tolerated procedure well. No change in symptoms. No signs of infection    Dorothyann Peng, NP

## 2018-02-27 ENCOUNTER — Other Ambulatory Visit: Payer: Self-pay

## 2018-02-27 ENCOUNTER — Encounter: Payer: Self-pay | Admitting: Family Medicine

## 2018-02-27 ENCOUNTER — Ambulatory Visit: Payer: BLUE CROSS/BLUE SHIELD | Admitting: Family Medicine

## 2018-02-27 VITALS — BP 142/80 | HR 69 | Temp 97.8°F | Ht 65.5 in | Wt 230.8 lb

## 2018-02-27 DIAGNOSIS — H6121 Impacted cerumen, right ear: Secondary | ICD-10-CM | POA: Diagnosis not present

## 2018-02-27 DIAGNOSIS — Z23 Encounter for immunization: Secondary | ICD-10-CM

## 2018-02-27 NOTE — Progress Notes (Signed)
Subjective  CC:  Chief Complaint  Patient presents with  . Ear Pain    Right Ear Pain, was seen on 02/25/2018, had ears washed out and cannot hear out of right ear now     HPI: Todd Spears is a 72 y.o. male who presents to the office today to address the problems listed above in the chief complaint.  I reviewed note from 9/25; evaluated for sinus congestion and cerumen impaction. Pt reports had sinus congestion w/o fever or ear pain or hearing loss (has hearing loss on left at baseline). Right ear was lavaged; since, he can't hear well out of the left ear. He denies pain or drainage or fever. His sinus sxs are impoving on doxy.   Assessment  1. Hearing loss secondary to cerumen impaction, right      Plan   Cerumen impaction with hearing loss:  Resolved with debridement. See below for procedure note.   Flu shot administered.   Follow up: prn   No orders of the defined types were placed in this encounter.  No orders of the defined types were placed in this encounter.     I reviewed the patients updated PMH, FH, and SocHx.    Patient Active Problem List   Diagnosis Date Noted  . Obesity 10/26/2015  . Carotid arterial disease ([place]) 08/10/2015  . Aortic stenosis, mild 08/10/2015  . Hyponatremia 07/03/2015  . Acute-on-chronic kidney injury ([place] [employer] [place]) 07/03/2015  . Leukocytosis 07/03/2015  . Seborrheic keratosis 03/14/2015  . Right groin pain 04/11/2014  . Anemia of unknown etiology 09/15/2012  . CAD (coronary artery disease)   . Leg pain, bilateral 01/30/2012  . Chest pain, atypical 12/10/2011  . Heart murmur previously undiagnosed 03/25/2011  . Anemia in chronic renal disease 12/18/2009  . RESTLESS LEG SYNDROME 03/23/2009  . ERECTILE DYSFUNCTION, ORGANIC 03/23/2009  . CAROTID BRUIT, RIGHT 11/08/2008  . CERUMEN IMPACTION, BILATERAL 07/07/2007  . EDEMA 04/06/2007  . Hyperlipidemia 11/27/2006  . Essential hypertension 11/27/2006  . Allergic rhinitis 11/27/2006  .  GERD 11/27/2006  . OSTEOARTHRITIS 11/27/2006   Current Meds  Medication Sig  . albuterol (PROAIR HFA) 108 (90 Base) MCG/ACT inhaler INHALE 2 PUFFS INTO THE LUNGS EVERY 6 (SIX) HOURS AS NEEDED. FOR SHORTNESS OF BREATH  . amLODipine (NORVASC) 10 MG tablet TAKE 1 TABLET DAILY  . aspirin 81 MG chewable tablet Chew 81 mg by mouth daily.  . cetirizine (ZYRTEC) 10 MG tablet Take 10 mg by mouth every morning.  Marland Kitchen COLCRYS 0.6 MG tablet Take 0.6 mg by mouth daily as needed (for gout flareups). Reported on 10/26/2015  . Cyanocobalamin (VITAMIN B 12 PO) Take 1 tablet by mouth daily.   . diclofenac sodium (VOLTAREN) 1 % GEL Apply 2 g topically 4 (four) times daily.  Marland Kitchen doxycycline (VIBRAMYCIN) 100 MG capsule Take 1 capsule (100 mg total) by mouth 2 (two) times daily.  Marland Kitchen esomeprazole (NEXIUM) 40 MG capsule TAKE 1 CAPSULE DAILY BEFOREBREAKFAST  . ezetimibe (ZETIA) 10 MG tablet Take 1 tablet (10 mg total) daily by mouth.  . FeFum-FePoly-FA-B Cmp-C-Biot (INTEGRA PLUS) CAPS Take 1 tablet by mouth daily.  . halobetasol (ULTRAVATE) 0.05 % cream Apply topically 2 (two) times daily.  . hydrALAZINE (APRESOLINE) 50 MG tablet Take 1.5 tablets (75 mg total) by mouth 3 (three) times daily. (Patient taking differently: Take 100 mg by mouth 3 (three) times daily. Take 2 tablets by mouth daily.)  . lisinopril (PRINIVIL,ZESTRIL) 20 MG tablet TAKE 1 TABLET TWICE A DAY  .  loratadine (CLARITIN) 10 MG tablet Take 10 mg by mouth at bedtime.   . mometasone (NASONEX) 50 MCG/ACT nasal spray Place 1-2 sprays into the nose as needed.  . montelukast (SINGULAIR) 10 MG tablet Take 1 tablet (10 mg total) by mouth at bedtime.  . Multiple Vitamin (MULTIVITAMIN WITH MINERALS) TABS tablet Take 1 tablet by mouth daily.  . Multiple Vitamins-Minerals (VISION-VITE PRESERVE PO) Take 1 tablet by mouth 2 (two) times daily.   . nitroGLYCERIN (NITROSTAT) 0.4 MG SL tablet Place 1 tablet (0.4 mg total) under the tongue every 5 (five) minutes as needed  for chest pain (up to 3 doses. not within 24hrs of Viagra).  . pravastatin (PRAVACHOL) 10 MG tablet Take 1 tablet (10 mg total) by mouth daily.  . pravastatin (PRAVACHOL) 10 MG tablet TAKE 1 TABLET EVERY OTHER DAY, ALTERNATING WITH 2 TABLETS.  . Saline (SIMPLY SALINE) 0.9 % AERS Place 1 spray into the nose daily as needed (congestion).   . traMADol (ULTRAM) 50 MG tablet TAKE 1 TABLET EVERY 12 HOURS  . ULORIC 80 MG TABS Take 1 tablet by mouth daily. Reported on 10/26/2015    Allergies: Patient is allergic to prednisone; crestor [rosuvastatin calcium]; and lyrica [pregabalin]. Family History: Patient family history includes Coronary artery disease in his father; Glaucoma in his mother; Heart attack in his father; Heart disease in his father; Hypertension in his mother, paternal grandfather, and paternal grandmother; Stomach cancer in his mother; Stroke in his mother. Social History:  Patient  reports that he quit smoking about 42 years ago. He has never used smokeless tobacco. He reports that he does not drink alcohol or use drugs.  Review of Systems: Constitutional: Negative for fever malaise or anorexia Cardiovascular: negative for chest pain Respiratory: negative for SOB or persistent cough Gastrointestinal: negative for abdominal pain  Objective  Vitals: BP (!) 142/80   Pulse 69   Temp 97.8 F (36.6 C)   Ht 5' 5.5" (1.664 m)   Wt 230 lb 12.8 oz (104.7 kg)   SpO2 99%   BMI 37.82 kg/m  General: no acute distress , A&Ox3, very pleasant, HOH HEENT: PEERL, conjunctiva normal, Oropharynx moist,neck is supple Right TM obstructed by large amount of wax. Left TM nl appearing Max sinus ttp but less than 48 hours ago Ceruminosis is noted.  Wax is removed by syringing and manual debridement Procedure: Cerumen Disimpaction right Wax softening drops applied and gentle manual debridement with a curette and lavage performed on the right. Large amount of wax was debrided and flushed out. There  were no complications and following the disimpaction the tympanic membrane was visible on the right. Tympanic membranes are intact following the procedure.  Auditory canals are inflamed.  No pinna ttp.The patient reported  of symptoms after removal of cerumen. He can now hear at his baseline on the right.     Commons side effects, risks, benefits, and alternatives for medications and treatment plan prescribed today were discussed, and the patient expressed understanding of the given instructions. Patient is instructed to call or message via MyChart if he/she has any questions or concerns regarding our treatment plan. No barriers to understanding were identified. We discussed Red Flag symptoms and signs in detail. Patient expressed understanding regarding what to do in case of urgent or emergency type symptoms.   Medication list was reconciled, printed and provided to the patient in AVS. Patient instructions and summary information was reviewed with the patient as documented in the AVS. This note was prepared  with assistance of Systems analyst. Occasional wrong-word or sound-a-like substitutions may have occurred due to the inherent limitations of voice recognition software

## 2018-02-27 NOTE — Patient Instructions (Signed)
Please follow up if symptoms do not improve or as needed.   

## 2018-03-19 ENCOUNTER — Encounter: Payer: BLUE CROSS/BLUE SHIELD | Admitting: Adult Health

## 2018-03-22 ENCOUNTER — Other Ambulatory Visit: Payer: Self-pay | Admitting: Cardiology

## 2018-03-22 ENCOUNTER — Other Ambulatory Visit: Payer: Self-pay | Admitting: Adult Health

## 2018-03-24 NOTE — Telephone Encounter (Signed)
Pt due for cpx.

## 2018-03-25 NOTE — Telephone Encounter (Signed)
Sent to the pharmacy by e-scribe.  Pt is now scheduled for cpx on 04/02/18.  No further action required.

## 2018-03-27 NOTE — Progress Notes (Signed)
Todd Spears Date of Birth: September 13, 1945 Medical Record #[id number]  History of Present Illness: Todd Spears is seen back today for follow up CAD. He is s/p 2 vessel PCI with Promus DES for 90% mid LAD and 95% mid RCA disease on 04/06/2012. His EF is normal at 55%. At that time he really had no significant anginal symptoms but had an abnormal stress test.  Myoview in Nov. 2014 showed a fixed inferior defect without ischemia. Last Myoview in Feb. 2017 was unchanged. Other issues include CKD stage 3, HTN, obesity, gout,,OA, ED, carotid bruit and asthma.   He was admitted in January 2017 with tremors and hyponatremia. Chlorthalidone was discontinued.   He had carotid dopplers showing 1-39% RICA and 41-63% LICA stenoses. Echo done in March 2017 showed normal LV function and mild AS unchanged from 2013. He has been followed in our lipid clinic for hypercholesterolemia with prior intolerance to statins. He is now on pravastatin 10 mg alternating with 20 mg daily. He is doing OK with this regimen but sometimes will start having more cramps so he will back off. On his last visit we started him on Zetia and he is tolerating this well.   On follow up today he is doing well. He denies any chest pain or SOB. No TIA or CVA symptoms. He is still working as a  Engineer, building services. Hasn't been exercising much. Reports BP at home 845-364 systolic and BP at last weeks physical was 142/70. Compliant with medication.    Allergies as of 04/06/2018      Reactions   Prednisone Shortness Of Breath, Swelling   Crestor [rosuvastatin Calcium] Other (See Comments)   myalgia   Lyrica [pregabalin]    "swelling"      Medication List        Accurate as of 04/06/18  5:35 PM. Always use your most recent med list.          albuterol 108 (90 Base) MCG/ACT inhaler Commonly known as:  PROVENTIL HFA;VENTOLIN HFA INHALE 2 PUFFS INTO THE LUNGS EVERY 6 (SIX) HOURS AS NEEDED. FOR SHORTNESS OF BREATH   amLODipine 10 MG tablet Commonly  known as:  NORVASC TAKE 1 TABLET EVERY DAY   aspirin 81 MG chewable tablet Chew 81 mg by mouth daily.   azelastine 0.1 % nasal spray Commonly known as:  ASTELIN Place 2 sprays into both nostrils 2 (two) times daily. Use in each nostril as directed   cetirizine 10 MG tablet Commonly known as:  ZYRTEC Take 10 mg by mouth every morning.   COLCRYS 0.6 MG tablet Generic drug:  colchicine Take 0.6 mg by mouth daily as needed (for gout flareups). Reported on 10/26/2015   diclofenac sodium 1 % Gel Commonly known as:  VOLTAREN Apply 2 g topically 4 (four) times daily.   esomeprazole 40 MG capsule Commonly known as:  NEXIUM TAKE 1 CAPSULE DAILY BEFOREBREAKFAST   ezetimibe 10 MG tablet Commonly known as:  ZETIA TAKE 1 TABLET BY MOUTH EVERY DAY   halobetasol 0.05 % cream Commonly known as:  ULTRAVATE Apply topically 2 (two) times daily.   hydrALAZINE 50 MG tablet Commonly known as:  APRESOLINE Take 2 tablets (100 mg total) by mouth 3 (three) times daily.   INTEGRA PLUS Caps Take 1 tablet by mouth daily.   lisinopril 20 MG tablet Commonly known as:  PRINIVIL,ZESTRIL TAKE 1 TABLET TWICE A DAY   loratadine 10 MG tablet Commonly known as:  CLARITIN Take 10 mg by mouth at  bedtime.   mometasone 50 MCG/ACT nasal spray Commonly known as:  NASONEX Place 1-2 sprays into the nose as needed.   montelukast 10 MG tablet Commonly known as:  SINGULAIR Take 1 tablet (10 mg total) by mouth at bedtime.   multivitamin with minerals Tabs tablet Take 1 tablet by mouth daily.   nitroGLYCERIN 0.4 MG SL tablet Commonly known as:  NITROSTAT Place 1 tablet (0.4 mg total) under the tongue every 5 (five) minutes as needed for chest pain (up to 3 doses. not within 24hrs of Viagra).   pravastatin 10 MG tablet Commonly known as:  PRAVACHOL Take 1 tablet (10 mg total) by mouth daily.   pravastatin 10 MG tablet Commonly known as:  PRAVACHOL TAKE 1 TABLET EVERY OTHER DAY, ALTERNATING WITH 2  TABLETS.   SIMPLY SALINE 0.9 % Aers Generic drug:  Saline Place 1 spray into the nose daily as needed (congestion).   traMADol 50 MG tablet Commonly known as:  ULTRAM TAKE 1 TABLET EVERY 12 HOURS   ULORIC 80 MG Tabs Generic drug:  Febuxostat Take 1 tablet by mouth daily. Reported on 10/26/2015   VISION-VITE PRESERVE PO Take 1 tablet by mouth 2 (two) times daily.   VITAMIN B 12 PO Take 1 tablet by mouth daily.        Allergies  Allergen Reactions  . Prednisone Shortness Of Breath and Swelling  . Crestor [Rosuvastatin Calcium] Other (See Comments)    myalgia  . Lyrica [Pregabalin]     "swelling"    Past Medical History:  Diagnosis Date  . Allergic rhinitis   . Anemia   . Asthma   . CAD (coronary artery disease)    a. s/p PTCA/stenting of the mid LAD and mid RCA 04/06/12  . Carotid bruit    r  . Colon polyp   . Diverticulosis   . ED (erectile dysfunction)   . Fatty liver   . GERD (gastroesophageal reflux disease)   . Gout   . Hyperlipidemia   . Hypertension   . Obesity   . Osteoarthritis   . Renal insufficiency   . Restless leg syndrome     Past Surgical History:  Procedure Laterality Date  . COLONOSCOPY    . INGUINAL HERNIA REPAIR Right 02/27/2016   Procedure: HERNIA REPAIR INGUINAL ADULT;  Surgeon: Rolm Bookbinder, MD;  Location: [facility];  Service: General;  Laterality: Right;  . INSERTION OF MESH Right 02/27/2016   Procedure: INSERTION OF MESH--IN AND OUT CATHETERIZATION PERFORMED AT END OF PROCEDURE TO DRAIN 400ML URINE.;  Surgeon: Rolm Bookbinder, MD;  Location: [facility];  Service: General;  Laterality: Right;  . NM PET LYMPHOMA     left side of neck 1999  . PERCUTANEOUS CORONARY STENT INTERVENTION (PCI-S) N/A 04/06/2012   Procedure: PERCUTANEOUS CORONARY STENT INTERVENTION (PCI-S);  Surgeon: Tamaj Jurgens M Martinique, MD; Promus DES for 90% mid LAD and 95% mid RCA disease      Social History   Tobacco Use  Smoking  Status Former Smoker  . Last attempt to quit: 06/04/1975  . Years since quitting: 42.8  Smokeless Tobacco Never Used    Social History   Substance and Sexual Activity  Alcohol Use No  . Alcohol/week: 0.0 standard drinks    Family History  Problem Relation Age of Onset  . Coronary artery disease Father        in his 37s  . Heart disease Father   . Heart attack Father   .  Hypertension Mother   . Stroke Mother        Several   . Stomach cancer Mother   . Glaucoma Mother   . Hypertension Paternal Grandmother   . Hypertension Paternal Grandfather   . Colon cancer Neg Hx   . Esophageal cancer Neg Hx   . Rectal cancer Neg Hx     Review of Systems: The review of systems is per the HPI.  All other systems were reviewed and are negative.  Physical Exam: BP (!) 183/77   Pulse 61   Ht 5\' 6"  (1.676 m)   Wt 233 lb 9.6 oz (106 kg)   BMI 37.70 kg/m  GENERAL:  Well appearing, obese WM  HEENT:  PERRL, EOMI, sclera are clear. Oropharynx is clear. NECK:  No jugular venous distention, carotid upstroke brisk and symmetric, no bruits, no thyromegaly or adenopathy LUNGS:  Clear to auscultation bilaterally CHEST:  Unremarkable HEART:  RRR,  PMI not displaced or sustained,S1 and S2 within normal limits, no S3, no S4: no clicks, no rubs, no murmurs ABD:  Soft, nontender. BS +, no masses or bruits. No hepatomegaly, no splenomegaly EXT:  2 + pulses throughout, no edema, no cyanosis no clubbing SKIN:  Warm and dry.  No rashes NEURO:  Alert and oriented x 3. Cranial nerves II through XII intact. PSYCH:  Cognitively intact      LABORATORY DATA: Lab Results  Component Value Date   WBC 6.6 04/02/2018   HGB 11.9 (L) 04/02/2018   HCT 34.9 (L) 04/02/2018   PLT 232.0 04/02/2018   GLUCOSE 108 (H) 04/02/2018   CHOL 151 04/02/2018   TRIG 135.0 04/02/2018   HDL 49.00 04/02/2018   LDLDIRECT 164.9 12/10/2010   LDLCALC 75 04/02/2018   ALT 31 04/02/2018   AST 31 04/02/2018   NA 130 (L)  04/02/2018   K 4.6 04/02/2018   CL 96 04/02/2018   CREATININE 1.19 04/02/2018   BUN 16 04/02/2018   CO2 29 04/02/2018   TSH 1.83 04/02/2018   PSA 0.61 04/02/2018   INR 1.0 04/03/2012   HGBA1C 5.6 10/26/2015   Ecg today shows NSR with first degree AV block. Rate 61. Otherwise normal. I have personally reviewed and interpreted this study.    Lexiscan myoview 07/28/15: Study Highlights     There was no ST segment deviation noted during stress.  The left ventricular ejection fraction is normal (55-65%).  Nuclear stress EF: 56%.  Defect 1: There is a medium defect of moderate severity present in the basal inferoseptal, basal inferior, mid inferior and apical inferior location. In setting of normal LVF, this is consistent with diaphragmatic attenuation artifact. No ischemia noted.  This is a low risk study.    Addendum by Sueanne Margarita, MD on Fri Jul 28, 2015 2:03 PM    There was no ST segment deviation noted during stress.  The left ventricular ejection fraction is normal (55-65%).  Nuclear stress EF: 56%.  Defect 1: There is a medium defect of moderate severity present in the basal inferoseptal, basal inferior, mid inferior and apical inferior location. In setting of normal LVF, this is consistent with diaphragmatic attenuation artifact. No ischemia noted.  This is a low risk study.    Echo: 08/25/15: Study Conclusions  - Left ventricle: The cavity size was normal. Wall thickness was   normal. Systolic function was normal. The estimated ejection   fraction was in the range of 60% to 65%. Wall motion was normal;   there  were no regional wall motion abnormalities. - Aortic valve: Moderately calcified annulus. Moderately calcified   leaflets. There was mild stenosis. - Mitral valve: There was mild regurgitation. - Left atrium: The atrium was mildly dilated  Carotid dopplers 08/12/17: bilateral 40-59% ICA stenosis.   Assessment / Plan:  1. CAD - status post 2  vessel PCI with DES in November 2013- doing well.  Myoview study in February 2017 was low risk with an area of inferior scar and no ischemia. Unchanged from 2014.he remains asymptomatic.  We will continue medical therapy and risk factor modification.   2. HTN - blood pressure is elevated today. This is discordant from readings he gets at home and from doctor visit last week.  He is on multiple meds. Will monitor on current therapy. Encourage weight loss and increased aerobic activity.   3. Hyponatremia secondary to chlorthalidone. Improved initially but now again present. Avoid drinking excessive water.   4. CKD stage 3.   5. Hyperlipidemia. Intolerant of statins at higher doses. Continue with  dietary modification and weight loss. Continue pravastatin. Excellent response to addition of Zetia. Now at goal.  6. Carotid arterial disease with moderate 45-03% LICA stenosis, 88-82% RICA stenosis in March 2019.  Asymptomatic. Recommend yearly follow up doppler.   7. History of mild Aortic stenosis.

## 2018-04-01 ENCOUNTER — Other Ambulatory Visit: Payer: Self-pay | Admitting: Adult Health

## 2018-04-02 ENCOUNTER — Encounter

## 2018-04-02 ENCOUNTER — Encounter: Payer: Self-pay | Admitting: Adult Health

## 2018-04-02 ENCOUNTER — Ambulatory Visit (INDEPENDENT_AMBULATORY_CARE_PROVIDER_SITE_OTHER): Payer: BLUE CROSS/BLUE SHIELD | Admitting: Adult Health

## 2018-04-02 VITALS — BP 170/70 | Temp 98.2°F | Ht 65.5 in | Wt 231.0 lb

## 2018-04-02 DIAGNOSIS — I2510 Atherosclerotic heart disease of native coronary artery without angina pectoris: Secondary | ICD-10-CM | POA: Diagnosis not present

## 2018-04-02 DIAGNOSIS — J302 Other seasonal allergic rhinitis: Secondary | ICD-10-CM

## 2018-04-02 DIAGNOSIS — E7800 Pure hypercholesterolemia, unspecified: Secondary | ICD-10-CM | POA: Diagnosis not present

## 2018-04-02 DIAGNOSIS — Z0000 Encounter for general adult medical examination without abnormal findings: Secondary | ICD-10-CM | POA: Diagnosis not present

## 2018-04-02 DIAGNOSIS — Z125 Encounter for screening for malignant neoplasm of prostate: Secondary | ICD-10-CM | POA: Diagnosis not present

## 2018-04-02 DIAGNOSIS — I10 Essential (primary) hypertension: Secondary | ICD-10-CM | POA: Diagnosis not present

## 2018-04-02 LAB — BASIC METABOLIC PANEL
BUN: 16 mg/dL (ref 6–23)
CO2: 29 mEq/L (ref 19–32)
Calcium: 9.2 mg/dL (ref 8.4–10.5)
Chloride: 96 mEq/L (ref 96–112)
Creatinine, Ser: 1.19 mg/dL (ref 0.40–1.50)
GFR: 63.72 mL/min (ref >60.00)
GLUCOSE: 108 mg/dL — AB (ref 70–99)
Potassium: 4.6 mEq/L (ref 3.5–5.1)
Sodium: 130 mEq/L — ABNORMAL LOW (ref 135–145)

## 2018-04-02 LAB — CBC WITH DIFFERENTIAL/PLATELET
BASOS ABS: 0.1 10*3/uL (ref 0.0–0.1)
Basophils Relative: 1.2 % (ref 0.0–3.0)
Eosinophils Absolute: 0.3 10*3/uL (ref 0.0–0.7)
Eosinophils Relative: 4.9 % (ref 0.0–5.0)
HCT: 34.9 % — ABNORMAL LOW (ref 39.0–52.0)
HEMOGLOBIN: 11.9 g/dL — AB (ref 13.0–17.0)
LYMPHS ABS: 2.3 10*3/uL (ref 0.7–4.0)
Lymphocytes Relative: 34.8 % (ref 12.0–46.0)
MCHC: 34.1 g/dL (ref 30.0–36.0)
MCV: 92.2 fl (ref 78.0–100.0)
MONO ABS: 0.6 10*3/uL (ref 0.1–1.0)
Monocytes Relative: 9.3 % (ref 3.0–12.0)
NEUTROS PCT: 49.8 % (ref 43.0–77.0)
Neutro Abs: 3.3 10*3/uL (ref 1.4–7.7)
Platelets: 232 10*3/uL (ref 150.0–400.0)
RBC: 3.79 Mil/uL — AB (ref 4.22–5.81)
RDW: 12.7 % (ref 11.5–15.5)
WBC: 6.6 10*3/uL (ref 4.0–10.5)

## 2018-04-02 LAB — LIPID PANEL
CHOLESTEROL: 151 mg/dL (ref 0–200)
HDL: 49 mg/dL (ref >39.00)
LDL Cholesterol: 75 mg/dL (ref 0–99)
NonHDL: 101.53
TRIGLYCERIDES: 135 mg/dL (ref 0.0–149.0)
Total CHOL/HDL Ratio: 3
VLDL: 27 mg/dL (ref 0.0–40.0)

## 2018-04-02 LAB — PSA: PSA: 0.61 ng/mL (ref 0.10–4.00)

## 2018-04-02 LAB — HEPATIC FUNCTION PANEL
ALT: 31 U/L (ref 0–53)
AST: 31 U/L (ref 0–37)
Albumin: 4.3 g/dL (ref 3.5–5.2)
Alkaline Phosphatase: 44 U/L (ref 39–117)
BILIRUBIN DIRECT: 0.1 mg/dL (ref 0.0–0.3)
BILIRUBIN TOTAL: 0.6 mg/dL (ref 0.2–1.2)
Total Protein: 6.7 g/dL (ref 6.0–8.3)

## 2018-04-02 LAB — TSH: TSH: 1.83 u[IU]/mL (ref 0.35–4.50)

## 2018-04-02 MED ORDER — AZELASTINE HCL 0.1 % NA SOLN: 2.0000 | Freq: Two times a day (BID) | NASAL | 12 refills | Status: DC

## 2018-04-02 NOTE — Telephone Encounter (Signed)
Appt with Tommi Rumps on 04/02/18

## 2018-04-02 NOTE — Progress Notes (Signed)
Subjective:    Patient ID: Todd Spears, male    DOB: [birth year], 72 y.o.   MRN: [id number]  HPI Patient presents for yearly preventative medicine examination. He is a pleasant 72 year old male who  has a past medical history of Allergic rhinitis, Anemia, Asthma, CAD (coronary artery disease), Carotid bruit, Colon polyp, Diverticulosis, ED (erectile dysfunction), Fatty liver, GERD (gastroesophageal reflux disease), Gout, Hyperlipidemia, Hypertension, Obesity, Osteoarthritis, Renal insufficiency, and Restless leg syndrome.  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  He is seen by cardiology for management of CAD and hypertension. His last carotid Doppler was in March 2019 which showed 40 to 59% stenosis in bilateral carotid arteries.  It was recommended follow-up in 12 months.  Denies  chest pain shortness of breath, headaches, dizziness, or lightheadedness  BP Readings from Last 3 Encounters:  04/02/18 (!) 170/70  02/27/18 (!) 142/80  02/25/18 (!) 150/50   Hyperlipidemia -by lipid clinic.  Currently takes pravastatin 20 mg every other day and 10 mg daily as well as zetia.   Seasonal allergies - has used zyrtec, Claritin, Singulair, Flonase, and normal saline in the past. Reports that he feels as though this is not working any longer and he constantly stays congested.  Denies any fevers.   All immunizations and health maintenance protocols were reviewed with the patient and needed orders were placed.  Appropriate screening laboratory values were ordered for the patient including screening of hyperlipidemia, renal function and hepatic function. If indicated by BPH, a PSA was ordered.  Medication reconciliation,  past medical history, social history, problem list and allergies were reviewed in detail with the patient  Goals were established with regard to weight loss,  exercise, and  diet in compliance with medications.  He is trying to eat healthy and walks a lot at work but does not exercise frequently outside of work.  Wt Readings from Last 3 Encounters:  04/02/18 231 lb (104.8 kg)  02/27/18 230 lb 12.8 oz (104.7 kg)  02/25/18 229 lb 12.8 oz (104.2 kg)   End of life planning was discussed.  Is up-to-date on routine health maintenance items such as colonoscopy dental and vision screen  Review of Systems  Constitutional: Negative.   HENT: Negative.   Eyes: Negative.   Respiratory: Negative.   Cardiovascular: Negative.   Gastrointestinal: Negative.   Endocrine: Negative.   Genitourinary: Negative.   Musculoskeletal: Negative.   Skin: Negative.   Allergic/Immunologic: Negative.   Neurological: Negative.   Hematological: Negative.   Psychiatric/Behavioral: Negative.   All other systems reviewed and are negative.  Past Medical History:  Diagnosis Date  . Allergic rhinitis   . Anemia   . Asthma   . CAD (coronary artery disease)    a. s/p PTCA/stenting of the mid LAD and mid RCA 04/06/12  . Carotid bruit    r  . Colon polyp   . Diverticulosis   . ED (erectile dysfunction)   . Fatty liver   . GERD (gastroesophageal reflux disease)   . Gout   . Hyperlipidemia   . Hypertension   . Obesity   . Osteoarthritis   . Renal insufficiency   . Restless leg syndrome     Social History   Socioeconomic History  . Marital status: Married    Spouse name: Not on file  . Number of children: 1  .  Years of education: Not on file  . Highest education level: Not on file  Occupational History    Employer: [employer]  Social Needs  . Financial resource strain: Not on file  . Food insecurity:    Worry: Not on file    Inability: Not on file  . Transportation needs:    Medical: Not on file    Non-medical: Not on file  Tobacco Use  . Smoking status: Former Smoker    Last attempt to quit: 06/04/1975    Years since quitting: 42.8  .  Smokeless tobacco: Never Used  Substance and Sexual Activity  . Alcohol use: No    Alcohol/week: 0.0 standard drinks  . Drug use: No  . Sexual activity: Not Currently  Lifestyle  . Physical activity:    Days per week: Not on file    Minutes per session: Not on file  . Stress: Not on file  Relationships  . Social connections:    Talks on phone: Not on file    Gets together: Not on file    Attends religious service: Not on file    Active member of club or organization: Not on file    Attends meetings of clubs or organizations: Not on file    Relationship status: Not on file  . Intimate partner violence:    Fear of current or ex partner: Not on file    Emotionally abused: Not on file    Physically abused: Not on file    Forced sexual activity: Not on file  Other Topics Concern  . Not on file  Social History Narrative   Works as a Engineer, building services    Married   One son ( lives in [place])    Regular exercise- no    Past Surgical History:  Procedure Laterality Date  . COLONOSCOPY    . INGUINAL HERNIA REPAIR Right 02/27/2016   Procedure: HERNIA REPAIR INGUINAL ADULT;  Surgeon: Rolm Bookbinder, MD;  Location: [facility];  Service: General;  Laterality: Right;  . INSERTION OF MESH Right 02/27/2016   Procedure: INSERTION OF MESH--IN AND OUT CATHETERIZATION PERFORMED AT END OF PROCEDURE TO DRAIN 400ML URINE.;  Surgeon: Rolm Bookbinder, MD;  Location: [facility];  Service: General;  Laterality: Right;  . NM PET LYMPHOMA     left side of neck 1999  . PERCUTANEOUS CORONARY STENT INTERVENTION (PCI-S) N/A 04/06/2012   Procedure: PERCUTANEOUS CORONARY STENT INTERVENTION (PCI-S);  Surgeon: Peter M Martinique, MD; Promus DES for 90% mid LAD and 95% mid RCA disease      Family History  Problem Relation Age of Onset  . Coronary artery disease Father        in his 9s  . Heart disease Father   . Heart attack Father   . Hypertension Mother   . Stroke Mother          Several   . Stomach cancer Mother   . Glaucoma Mother   . Hypertension Paternal Grandmother   . Hypertension Paternal Grandfather   . Colon cancer Neg Hx   . Esophageal cancer Neg Hx   . Rectal cancer Neg Hx     Allergies  Allergen Reactions  . Prednisone Shortness Of Breath and Swelling  . Crestor [Rosuvastatin Calcium] Other (See Comments)    myalgia  . Lyrica [Pregabalin]     "swelling"    Current Outpatient Medications on File Prior to Visit  Medication Sig Dispense Refill  . albuterol (  PROAIR HFA) 108 (90 Base) MCG/ACT inhaler INHALE 2 PUFFS INTO THE LUNGS EVERY 6 (SIX) HOURS AS NEEDED. FOR SHORTNESS OF BREATH 1 Inhaler 2  . amLODipine (NORVASC) 10 MG tablet TAKE 1 TABLET EVERY DAY 90 tablet 0  . aspirin 81 MG chewable tablet Chew 81 mg by mouth daily.    . cetirizine (ZYRTEC) 10 MG tablet Take 10 mg by mouth every morning.    Marland Kitchen COLCRYS 0.6 MG tablet Take 0.6 mg by mouth daily as needed (for gout flareups). Reported on 10/26/2015    . Cyanocobalamin (VITAMIN B 12 PO) Take 1 tablet by mouth daily.     . diclofenac sodium (VOLTAREN) 1 % GEL Apply 2 g topically 4 (four) times daily. 100 g 3  . esomeprazole (NEXIUM) 40 MG capsule TAKE 1 CAPSULE DAILY BEFOREBREAKFAST 90 capsule 1  . ezetimibe (ZETIA) 10 MG tablet TAKE 1 TABLET BY MOUTH EVERY DAY 90 tablet 3  . FeFum-FePoly-FA-B Cmp-C-Biot (INTEGRA PLUS) CAPS Take 1 tablet by mouth daily.    . halobetasol (ULTRAVATE) 0.05 % cream Apply topically 2 (two) times daily. 50 g 3  . hydrALAZINE (APRESOLINE) 50 MG tablet Take 1.5 tablets (75 mg total) by mouth 3 (three) times daily. (Patient taking differently: Take 100 mg by mouth 3 (three) times daily. Take 2 tablets by mouth daily.) 405 tablet 1  . lisinopril (PRINIVIL,ZESTRIL) 20 MG tablet TAKE 1 TABLET TWICE A DAY 180 tablet 1  . loratadine (CLARITIN) 10 MG tablet Take 10 mg by mouth at bedtime.     . mometasone (NASONEX) 50 MCG/ACT nasal spray Place 1-2 sprays into the nose as  needed.    . montelukast (SINGULAIR) 10 MG tablet Take 1 tablet (10 mg total) by mouth at bedtime. 90 tablet 3  . Multiple Vitamin (MULTIVITAMIN WITH MINERALS) TABS tablet Take 1 tablet by mouth daily.    . Multiple Vitamins-Minerals (VISION-VITE PRESERVE PO) Take 1 tablet by mouth 2 (two) times daily.     . nitroGLYCERIN (NITROSTAT) 0.4 MG SL tablet Place 1 tablet (0.4 mg total) under the tongue every 5 (five) minutes as needed for chest pain (up to 3 doses. not within 24hrs of Viagra). 25 tablet 6  . pravastatin (PRAVACHOL) 10 MG tablet Take 1 tablet (10 mg total) by mouth daily. 90 tablet 1  . pravastatin (PRAVACHOL) 10 MG tablet TAKE 1 TABLET EVERY OTHER DAY, ALTERNATING WITH 2 TABLETS. 90 tablet 3  . Saline (SIMPLY SALINE) 0.9 % AERS Place 1 spray into the nose daily as needed (congestion).     . traMADol (ULTRAM) 50 MG tablet TAKE 1 TABLET EVERY 12 HOURS 60 tablet 0  . ULORIC 80 MG TABS Take 1 tablet by mouth daily. Reported on 10/26/2015     No current facility-administered medications on file prior to visit.     BP (!) 170/70   Temp 98.2 F (36.8 C)   Ht 5' 5.5" (1.664 m)   Wt 231 lb (104.8 kg)   BMI 37.86 kg/m       Objective:   Physical Exam  Constitutional: He is oriented to person, place, and time. He appears well-developed and well-nourished. No distress.  HENT:  Head: Normocephalic and atraumatic.  Right Ear: External ear normal.  Left Ear: External ear normal.  Nose: Mucosal edema present. No rhinorrhea, nasal deformity or septal deviation. Right sinus exhibits no maxillary sinus tenderness and no frontal sinus tenderness. Left sinus exhibits no maxillary sinus tenderness and no frontal sinus tenderness.  Mouth/Throat:  Oropharynx is clear and moist. No oropharyngeal exudate.  Eyes: Pupils are equal, round, and reactive to light. Conjunctivae and EOM are normal. Right eye exhibits no discharge. Left eye exhibits no discharge. No scleral icterus.  Neck: Normal range of  motion. Neck supple. No JVD present. No tracheal deviation present. No thyromegaly present.  Cardiovascular: Normal rate, regular rhythm, normal heart sounds and intact distal pulses. Exam reveals no gallop and no friction rub.  No murmur heard. Pulmonary/Chest: Effort normal and breath sounds normal. No stridor. No respiratory distress. He has no wheezes. He has no rales. He exhibits no tenderness.  Abdominal: Soft. Bowel sounds are normal. He exhibits no distension and no mass. There is no tenderness. There is no rebound and no guarding. No hernia.  Musculoskeletal: Normal range of motion. He exhibits no edema, tenderness or deformity.  Lymphadenopathy:    He has no cervical adenopathy.  Neurological: He is alert and oriented to person, place, and time. He displays normal reflexes. No cranial nerve deficit or sensory deficit. He exhibits normal muscle tone. Coordination normal.  Skin: Skin is warm and dry. Capillary refill takes less than 2 seconds. No rash noted. He is not diaphoretic. No erythema. No pallor.  Psychiatric: He has a normal mood and affect. His behavior is normal. Judgment and thought content normal.  Nursing note and vitals reviewed.     Assessment & Plan:  1. Routine general medical examination at a health care facility - Encouraged exercise outside of work, can walk on treadmill at home  - Follow up in one year or sooner if needed - Basic metabolic panel - CBC with Differential/Platelet - Hepatic function panel - TSH - Lipid panel  2. Essential hypertension - Near goal. Better controlled at home  - Basic metabolic panel - CBC with Differential/Platelet - Hepatic function panel - TSH - Lipid panel  3. Coronary artery disease involving native coronary artery of native heart without angina pectoris - Continue with Lipid/Cardiology plan of care  - Basic metabolic panel - CBC with Differential/Platelet - Hepatic function panel - TSH - Lipid panel  4. Pure  hypercholesterolemia - Continue with Lipid/Cardiology plan of care  - Basic metabolic panel - CBC with Differential/Platelet - Hepatic function panel - TSH - Lipid panel  5. Prostate cancer screening  - PSA  6. Seasonal allergies  - azelastine (ASTELIN) 0.1 % nasal spray; Place 2 sprays into both nostrils 2 (two) times daily. Use in each nostril as directed  Dispense: 30 mL; Refill: 12 - Consider ENT or allergy referral    Dorothyann Peng, NP

## 2018-04-03 NOTE — Telephone Encounter (Signed)
Sent to the pharmacy by e-scribe.  Pt had cpx on 04/02/18

## 2018-04-06 ENCOUNTER — Encounter: Payer: Self-pay | Admitting: Cardiology

## 2018-04-06 ENCOUNTER — Ambulatory Visit: Payer: BLUE CROSS/BLUE SHIELD | Admitting: Cardiology

## 2018-04-06 VITALS — BP 183/77 | HR 61 | Ht 66.0 in | Wt 233.6 lb

## 2018-04-06 DIAGNOSIS — E7800 Pure hypercholesterolemia, unspecified: Secondary | ICD-10-CM

## 2018-04-06 DIAGNOSIS — I2510 Atherosclerotic heart disease of native coronary artery without angina pectoris: Secondary | ICD-10-CM | POA: Diagnosis not present

## 2018-04-06 DIAGNOSIS — I10 Essential (primary) hypertension: Secondary | ICD-10-CM

## 2018-04-06 DIAGNOSIS — I350 Nonrheumatic aortic (valve) stenosis: Secondary | ICD-10-CM

## 2018-04-06 MED ORDER — HYDRALAZINE HCL 50 MG PO TABS: 100.0000 mg | ORAL_TABLET | Freq: Three times a day (TID) | ORAL | Status: DC

## 2018-04-06 NOTE — Patient Instructions (Signed)
Continue your current therapy and watch BP

## 2018-04-07 DIAGNOSIS — H401121 Primary open-angle glaucoma, left eye, mild stage: Secondary | ICD-10-CM | POA: Diagnosis not present

## 2018-04-12 ENCOUNTER — Other Ambulatory Visit: Payer: Self-pay | Admitting: Cardiology

## 2018-04-26 ENCOUNTER — Other Ambulatory Visit: Payer: Self-pay | Admitting: Adult Health

## 2018-04-27 NOTE — Telephone Encounter (Signed)
Sent to the pharmacy by e-scribe. 

## 2018-05-05 DIAGNOSIS — H401121 Primary open-angle glaucoma, left eye, mild stage: Secondary | ICD-10-CM | POA: Diagnosis not present

## 2018-05-06 ENCOUNTER — Encounter: Payer: Self-pay | Admitting: Podiatry

## 2018-05-06 ENCOUNTER — Ambulatory Visit (INDEPENDENT_AMBULATORY_CARE_PROVIDER_SITE_OTHER): Payer: BLUE CROSS/BLUE SHIELD | Admitting: Podiatry

## 2018-05-06 DIAGNOSIS — M79674 Pain in right toe(s): Secondary | ICD-10-CM

## 2018-05-06 DIAGNOSIS — B351 Tinea unguium: Secondary | ICD-10-CM

## 2018-05-06 DIAGNOSIS — M79675 Pain in left toe(s): Secondary | ICD-10-CM

## 2018-05-06 DIAGNOSIS — Q828 Other specified congenital malformations of skin: Secondary | ICD-10-CM | POA: Diagnosis not present

## 2018-05-06 DIAGNOSIS — M216X9 Other acquired deformities of unspecified foot: Secondary | ICD-10-CM | POA: Diagnosis not present

## 2018-05-06 NOTE — Progress Notes (Signed)
Patient ID: [name], male   DOB: 1946-01-22, 72 y.o.   MRN: [id number] Complaint:  Visit Type: Patient returns to my office for continued preventative foot care services. Complaint: Patient states" my nails have grown long and thick and become painful to walk and wear shoes". The patient presents for preventative foot care services. No changes to ROS.  Painful callus under the outside ball of both feet.  Podiatric Exam: Vascular: dorsalis pedis and posterior tibial pulses are palpable bilateral. Capillary return is immediate. Temperature gradient is WNL. Skin turgor WNL  Sensorium: Normal Semmes Weinstein monofilament test. Normal tactile sensation bilaterally. Nail Exam: Pt has thick disfigured discolored nails with subungual debris noted bilateral entire nail hallux through fifth toenails Ulcer Exam: There is no evidence of ulcer or pre-ulcerative changes or infection. Orthopedic Exam: Muscle tone and strength are WNL. No limitations in general ROM. No crepitus or effusions noted. HAV left with overlapping second.  HAV right foot. Skin:  Porokeratosis sub 5th metatarsal head both feet.. No infection or ulcers.  Porokeratosis sub 4 right. Diagnosis:  Onychomycosis, , Pain in right toe, pain in left toes. Porokeratosis B/L  Treatment & Plan Procedures and Treatment: Consent by patient was obtained for treatment procedures. The patient understood the discussion of treatment and procedures well. All questions were answered thoroughly reviewed. Debridement of mycotic and hypertrophic toenails, 1 through 5 bilateral and clearing of subungual debris. No ulceration, no infection noted.  Return Visit-Office Procedure: Patient instructed to return to the office for a follow up visit 3 months for continued evaluation and treatment.  Gardiner Barefoot DPM

## 2018-05-15 ENCOUNTER — Other Ambulatory Visit: Payer: Self-pay | Admitting: Cardiology

## 2018-05-16 ENCOUNTER — Other Ambulatory Visit: Payer: Self-pay | Admitting: Adult Health

## 2018-05-18 NOTE — Telephone Encounter (Signed)
Sent to the pharmacy by e-scribe. 

## 2018-06-03 HISTORY — PX: BACK SURGERY: SHX140

## 2018-06-12 ENCOUNTER — Telehealth: Payer: Self-pay | Admitting: Cardiology

## 2018-06-12 ENCOUNTER — Telehealth: Payer: Self-pay

## 2018-06-12 NOTE — Telephone Encounter (Signed)
° ° °  Pt c/o Shortness Of Breath: STAT if SOB developed within the last 24 hours or pt is noticeably SOB on the phone  1. Are you currently SOB (can you hear that pt is SOB on the phone)? no  2. How long have you been experiencing SOB? 1 week  3. Are you SOB when sitting or when up moving around? Sitting and moving, worse at night  4. Are you currently experiencing any other symptoms? no

## 2018-06-12 NOTE — Telephone Encounter (Signed)
He can wait until visit with Arnold Long. No history of CHF  Inell Mimbs Martinique MD, [facility]

## 2018-06-12 NOTE — Telephone Encounter (Signed)
Sending to Dr. Rogers Blocker.

## 2018-06-12 NOTE — Telephone Encounter (Signed)
Copied from [place] [id number]. Topic: General - Other >> Jun 12, 2018 11:26 AM Keene Breath wrote: Reason for CRM: Patient called to request to change his PCP to the [place] location with Dr. Jerline Pain.  Patient stated that this would be more convenient for him.  Please advise and call patient to confirm that this would be ok.  CB# [phone]

## 2018-06-12 NOTE — Telephone Encounter (Signed)
The transfer of care is with Dimas Chyle. Please advise

## 2018-06-12 NOTE — Telephone Encounter (Signed)
That is fine. I wish him luck and it was great having him here

## 2018-06-12 NOTE — Telephone Encounter (Signed)
Acknowledged.

## 2018-06-12 NOTE — Telephone Encounter (Signed)
Returned call to patient of Dr. Martinique who c/o SOB. He reports congestion for 2 weeks. He had stents in 2013. He has seen PCP and he has been using nasal spray and SOB has not resolved. He is able to walk and climb stairs. He feels unable to take a deep breath sometimes. He has to prop himself up in bed to sleep. He denies chest pain/tightness.   He was scheduled to see Julieta Bellini on MD care team but earliest opening was Jan 24. Patient would like to be seen sooner. Scheduled to see K. West Pugh on Jan 15 @ 10am.   Advised patient I would route message to MD to review and advise and he would be called back IF there were any recommendations prior to this appointment.

## 2018-06-12 NOTE — Telephone Encounter (Signed)
Cory - Please advise on pt's request to transfer care. Thanks!

## 2018-06-15 NOTE — Telephone Encounter (Signed)
Betsy Layne with me.   [name]. Jerline Pain, MD 06/15/2018 8:02 AM

## 2018-06-17 ENCOUNTER — Ambulatory Visit: Payer: BLUE CROSS/BLUE SHIELD | Admitting: Adult Health

## 2018-06-17 NOTE — Progress Notes (Deleted)
Cardiology Office Note   Date:  06/17/2018   ID:  Todd Spears, DOB 09-28-1945, MRN [id number]  PCP:  Todd Peng, NP  Cardiologist: Dr. Martinique  No chief complaint on file.    History of Present Illness: Todd Spears is a 73 y.o. male who presents for ongoing assessment and management of CAD, hx of 2 vessel PCI with Promus DES for LAD with 90% stenosis, and mid RCA of 95% stenosis, on 04/06/2012. He had an abnormal stress test in 2014 which showed a fixed inferior defect without ischemia. He has carotid bruit with carotid dopplers showing 1-39% RICA and 81-85% LICA stenoses. Echo done in March 2017 showed normal LV function and mild AS unchanged from 2013.Marland Kitchen He has other history of CKD Stage 3, HTN, obesity, OA, Gout, and ED  He is also followed in our lipid clinic and is intolerant to statins. However, is able to take pravastatin 10 mg alternating with 20 mg daily, and Zetia.  He was last seen by Dr. Martinique on 04/06/2018 and was stable but had mildly elevated BP.  He called our office on 06/12/2018 with complaints of dyspnea and congestion for 2 weeks. He has been seen by his PCP and is using nasal inhalers. He has to prop himself up in bed to sleep. He requested appointment.   Past Medical History:  Diagnosis Date  . Allergic rhinitis   . Anemia   . Asthma   . CAD (coronary artery disease)    a. s/p PTCA/stenting of the mid LAD and mid RCA 04/06/12  . Carotid bruit    r  . Colon polyp   . Diverticulosis   . ED (erectile dysfunction)   . Fatty liver   . GERD (gastroesophageal reflux disease)   . Gout   . Hyperlipidemia   . Hypertension   . Obesity   . Osteoarthritis   . Renal insufficiency   . Restless leg syndrome     Past Surgical History:  Procedure Laterality Date  . COLONOSCOPY    . INGUINAL HERNIA REPAIR Right 02/27/2016   Procedure: HERNIA REPAIR INGUINAL ADULT;  Surgeon: Rolm Bookbinder, MD;  Location: [place];  Service: General;  Laterality:  Right;  . INSERTION OF MESH Right 02/27/2016   Procedure: INSERTION OF MESH--IN AND OUT CATHETERIZATION PERFORMED AT END OF PROCEDURE TO DRAIN 400ML URINE.;  Surgeon: Rolm Bookbinder, MD;  Location: [place];  Service: General;  Laterality: Right;  . NM PET LYMPHOMA     left side of neck 1999  . PERCUTANEOUS CORONARY STENT INTERVENTION (PCI-S) N/A 04/06/2012   Procedure: PERCUTANEOUS CORONARY STENT INTERVENTION (PCI-S);  Surgeon: Peter M Martinique, MD; Promus DES for 90% mid LAD and 95% mid RCA disease       Current Outpatient Medications  Medication Sig Dispense Refill  . albuterol (PROAIR HFA) 108 (90 Base) MCG/ACT inhaler INHALE 2 PUFFS INTO THE LUNGS EVERY 6 (SIX) HOURS AS NEEDED. FOR SHORTNESS OF BREATH 1 Inhaler 2  . amLODipine (NORVASC) 10 MG tablet TAKE 1 TABLET EVERY DAY 90 tablet 0  . aspirin 81 MG chewable tablet Chew 81 mg by mouth daily.    Marland Kitchen azelastine (ASTELIN) 0.1 % nasal spray Place 2 sprays into both nostrils 2 (two) times daily. Use in each nostril as directed 30 mL 12  . cetirizine (ZYRTEC) 10 MG tablet Take 10 mg by mouth every morning.    Marland Kitchen COLCRYS 0.6 MG tablet Take 0.6 mg by mouth daily as  needed (for gout flareups). Reported on 10/26/2015    . Cyanocobalamin (VITAMIN B 12 PO) Take 1 tablet by mouth daily.     . diclofenac sodium (VOLTAREN) 1 % GEL Apply 2 g topically 4 (four) times daily. 100 g 3  . esomeprazole (NEXIUM) 40 MG capsule TAKE 1 CAPSULE DAILY BEFOREBREAKFAST 90 capsule 3  . ezetimibe (ZETIA) 10 MG tablet TAKE 1 TABLET BY MOUTH EVERY DAY 90 tablet 3  . FeFum-FePoly-FA-B Cmp-C-Biot (INTEGRA PLUS) CAPS Take 1 tablet by mouth daily.    . halobetasol (ULTRAVATE) 0.05 % cream Apply topically 2 (two) times daily. 50 g 3  . hydrALAZINE (APRESOLINE) 50 MG tablet Take 2 tablets (100 mg total) by mouth 3 (three) times daily.    . hydrALAZINE (APRESOLINE) 50 MG tablet TAKE 1.5 TABLETS (75 MG TOTAL) BY MOUTH 3 (THREE) TIMES DAILY. 405 tablet 3  .  lisinopril (PRINIVIL,ZESTRIL) 20 MG tablet TAKE 1 TABLET TWICE A DAY 180 tablet 1  . loratadine (CLARITIN) 10 MG tablet Take 10 mg by mouth at bedtime.     . metoprolol succinate (TOPROL-XL) 50 MG 24 hr tablet TAKE 1 TABLET DAILY WITH ORIMMEDIATELY FOLLOWING A    MEAL 90 tablet 3  . mometasone (NASONEX) 50 MCG/ACT nasal spray Place 1-2 sprays into the nose as needed.    . montelukast (SINGULAIR) 10 MG tablet Take 1 tablet (10 mg total) by mouth at bedtime. 90 tablet 3  . Multiple Vitamin (MULTIVITAMIN WITH MINERALS) TABS tablet Take 1 tablet by mouth daily.    . Multiple Vitamins-Minerals (VISION-VITE PRESERVE PO) Take 1 tablet by mouth 2 (two) times daily.     . nitroGLYCERIN (NITROSTAT) 0.4 MG SL tablet Place 1 tablet (0.4 mg total) under the tongue every 5 (five) minutes as needed for chest pain (up to 3 doses. not within 24hrs of Viagra). 25 tablet 6  . pravastatin (PRAVACHOL) 10 MG tablet Take 1 tablet (10 mg total) by mouth daily. 90 tablet 1  . pravastatin (PRAVACHOL) 10 MG tablet TAKE 1 TABLET EVERY OTHER DAY, ALTERNATING WITH 2 TABLETS. 135 tablet 3  . Saline (SIMPLY SALINE) 0.9 % AERS Place 1 spray into the nose daily as needed (congestion).     . traMADol (ULTRAM) 50 MG tablet TAKE 1 TABLET EVERY 12 HOURS 60 tablet 0  . ULORIC 80 MG TABS Take 1 tablet by mouth daily. Reported on 10/26/2015     No current facility-administered medications for this visit.     Allergies:   Prednisone; Crestor [rosuvastatin calcium]; and Lyrica [pregabalin]    Social History:  The patient  reports that he quit smoking about 43 years ago. He has never used smokeless tobacco. He reports that he does not drink alcohol or use drugs.   Family History:  The patient's family history includes Coronary artery disease in his father; Glaucoma in his mother; Heart attack in his father; Heart disease in his father; Hypertension in his mother, paternal grandfather, and paternal grandmother; Stomach cancer in his  mother; Stroke in his mother.    ROS: All other systems are reviewed and negative. Unless otherwise mentioned in H&P    PHYSICAL EXAM: VS:  There were no vitals taken for this visit. , BMI There is no height or weight on file to calculate BMI. GEN: Well nourished, well developed, in no acute distress HEENT: normal Neck: no JVD, carotid bruits, or masses Cardiac: ***RRR; no murmurs, rubs, or gallops,no edema  Respiratory:  Clear to auscultation bilaterally, normal work  of breathing GI: soft, nontender, nondistended, + BS MS: no deformity or atrophy Skin: warm and dry, no rash Neuro:  Strength and sensation are intact Psych: euthymic mood, full affect   EKG:  EKG {ACTION; IS/IS [id number]} ordered today. The ekg ordered today demonstrates ***   Recent Labs: 04/02/2018: ALT 31; BUN 16; Creatinine, Ser 1.19; Hemoglobin 11.9; Platelets 232.0; Potassium 4.6; Sodium 130; TSH 1.83    Lipid Panel    Component Value Date/Time   CHOL 151 04/02/2018 1323   CHOL 147 07/09/2017 0817   TRIG 135.0 04/02/2018 1323   HDL 49.00 04/02/2018 1323   HDL 63 07/09/2017 0817   CHOLHDL 3 04/02/2018 1323   VLDL 27.0 04/02/2018 1323   LDLCALC 75 04/02/2018 1323   LDLCALC 62 07/09/2017 0817   LDLDIRECT 164.9 12/10/2010 0810      Wt Readings from Last 3 Encounters:  04/06/18 233 lb 9.6 oz (106 kg)  04/02/18 231 lb (104.8 kg)  02/27/18 230 lb 12.8 oz (104.7 kg)      Other studies Reviewed: Leane Call 07/28/15:    Study Highlights     There was no ST segment deviation noted during stress.  The left ventricular ejection fraction is normal (55-65%).  Nuclear stress EF: 56%.  Defect 1: There is a medium defect of moderate severity present in the basal inferoseptal, basal inferior, mid inferior and apical inferior location. In setting of normal LVF, this is consistent with diaphragmatic attenuation artifact. No ischemia noted.  This is a low risk study.    Addendum by Sueanne Margarita, MD on Fri Jul 28, 2015 2:03 PM    There was no ST segment deviation noted during stress.  The left ventricular ejection fraction is normal (55-65%).  Nuclear stress EF: 56%.  Defect 1: There is a medium defect of moderate severity present in the basal inferoseptal, basal inferior, mid inferior and apical inferior location. In setting of normal LVF, this is consistent with diaphragmatic attenuation artifact. No ischemia noted.  This is a low risk study.    Echo: 08/25/15: Study Conclusions  - Left ventricle: The cavity size was normal. Wall thickness was normal. Systolic function was normal. The estimated ejection fraction was in the range of 60% to 65%. Wall motion was normal; there were no regional wall motion abnormalities. - Aortic valve: Moderately calcified annulus. Moderately calcified leaflets. There was mild stenosis. - Mitral valve: There was mild regurgitation. - Left atrium: The atrium was mildly dilated  Carotid dopplers 08/12/17: bilateral 40-59% ICA stenosis.     ASSESSMENT AND PLAN:  1.  ***   Current medicines are reviewed at length with the patient today.    Labs/ tests ordered today include: *** Phill Myron. West Pugh, ANP, AACC   06/17/2018 7:12 AM    [address] Office [phone] Fax [phone]

## 2018-06-18 ENCOUNTER — Encounter: Payer: BLUE CROSS/BLUE SHIELD | Admitting: Family Medicine

## 2018-06-18 ENCOUNTER — Ambulatory Visit: Payer: BLUE CROSS/BLUE SHIELD | Admitting: Family Medicine

## 2018-06-18 ENCOUNTER — Encounter: Payer: Self-pay | Admitting: Family Medicine

## 2018-06-18 VITALS — BP 150/68 | HR 57 | Temp 98.7°F | Ht 66.0 in | Wt 238.0 lb

## 2018-06-18 DIAGNOSIS — K219 Gastro-esophageal reflux disease without esophagitis: Secondary | ICD-10-CM | POA: Diagnosis not present

## 2018-06-18 DIAGNOSIS — J309 Allergic rhinitis, unspecified: Secondary | ICD-10-CM | POA: Diagnosis not present

## 2018-06-18 DIAGNOSIS — M5440 Lumbago with sciatica, unspecified side: Secondary | ICD-10-CM | POA: Diagnosis not present

## 2018-06-18 DIAGNOSIS — I10 Essential (primary) hypertension: Secondary | ICD-10-CM

## 2018-06-18 DIAGNOSIS — I739 Peripheral vascular disease, unspecified: Secondary | ICD-10-CM

## 2018-06-18 DIAGNOSIS — S20419A Abrasion of unspecified back wall of thorax, initial encounter: Secondary | ICD-10-CM

## 2018-06-18 DIAGNOSIS — M109 Gout, unspecified: Secondary | ICD-10-CM

## 2018-06-18 DIAGNOSIS — E7800 Pure hypercholesterolemia, unspecified: Secondary | ICD-10-CM

## 2018-06-18 DIAGNOSIS — I779 Disorder of arteries and arterioles, unspecified: Secondary | ICD-10-CM

## 2018-06-18 DIAGNOSIS — M1990 Unspecified osteoarthritis, unspecified site: Secondary | ICD-10-CM | POA: Insufficient documentation

## 2018-06-18 MED ORDER — METHYLPREDNISOLONE ACETATE 80 MG/ML IJ SUSP
80.0000 mg | Freq: Once | INTRAMUSCULAR | Status: AC
  Administered 2018-06-18: 80 mg via INTRAMUSCULAR

## 2018-06-18 NOTE — Assessment & Plan Note (Addendum)
Last LDL 75.  Continue Zetia 10 mg daily and pravastatin per cardiology.

## 2018-06-18 NOTE — Patient Instructions (Signed)
It was very nice to see you today!  Cortisone shot today.  This should help with your pain.  Please work on the exercises.  You can take Tylenol and tramadol as needed.  Please continue with the heating pad.  Let me know if your symptoms worsen or do not improve in the next 1 to 2 weeks.  Come back to see me in the fall for your physical, or sooner as needed.  Take care, Dr Jerline Pain

## 2018-06-18 NOTE — Assessment & Plan Note (Addendum)
At goal on current regimen of Norvasc 10 mg daily, lisinopril 20 mg twice daily, metoprolol 50 mg daily, and hydralazine 100 mg 3 times daily.

## 2018-06-18 NOTE — Assessment & Plan Note (Signed)
Continue Astelin nasal spray, Claritin 10 mg daily, and Singulair 10 mg daily.

## 2018-06-18 NOTE — Progress Notes (Signed)
Subjective:  Todd Spears is a 73 y.o. male who presents today for same-day appointment with a chief complaint of low back pain and to transfer care to this office.   HPI:  Low Back Pain, Acute problem Started about a week ago.  No obvious precipitating events.  No injuries.  Located in right lower back and radiates into his right leg.  He has tried using ice and heating pads which have helped.  Also tried taking Tylenol which is helped.  No bowel or bladder incontinence.  No urinary retention.  No weakness or numbness.  No other treatments tried.  Pain is been stable over last couple of days.  It is interfering with his ability to work and perform his daily activities.  No other obvious alleviating or aggravating factors.   His stable, chronic medical conditions are outlined below:  # HTN - On norvasc 10mg  qd, lisinopril 20mg  bid, metoprolol 50mg  qd, hydralazine 100mg  tid and tolerating well  # CAD / Carotid Artery Disease s/p PCI in 2013 / HLD - follows with cardiology - On aspirin 81mg  daily,  Pravastatin daily, and zetia 10mg  daily  # GERD - Takes nexium 40mg  daily  # Allergic Rhinitis - On astelin nasal spray, claritin 10mg  daily, and singulair 10mg  daily  # Gout - Follows with Rheumatology - Dr Amil Amen - Takes uloric 80mg  daily  # Osteoarthritis - Takes tylenol and tramadol as needed.    # CKD - Follows with nephrology - Dr Ambrose Pancoast  ROS: Per HPI  PMH: He reports that he quit smoking about 43 years ago. He has never used smokeless tobacco. He reports that he does not drink alcohol or use drugs.  Objective:  Physical Exam: BP (!) 150/68 (BP Location: Left Arm, Patient Position: Sitting, Cuff Size: Large)   Pulse (!) 57   Temp 98.7 F (37.1 C) (Oral)   Ht 5\' 6"  (1.676 m)   Wt 238 lb (108 kg)   SpO2 99%   BMI 38.41 kg/m   Gen: NAD, resting comfortably CV: RRR with no murmurs appreciated Pulm: NWOB, CTAB with no crackles, wheezes, or rhonchi GI: Normal  bowel sounds present. Soft, Nontender, Nondistended. MSK: No edema, cyanosis, or clubbing noted -Back: No deformities.  Tender palpation along superior right gluteal muscle group.  No midline tenderness. -Lower extremities: No deformities.  Strength 5 out of 5 throughout.  Straight leg raise test negative bilaterally.  Neurovascular intact distally. Skin: Warm, dry Neuro: Grossly normal, moves all extremities Psych: Normal affect and thought content  Assessment/Plan:  Back pain Consistent with piriformis syndrome.  He does have significant lumbar spine degenerative disc disease based on MRI from a couple of years ago which is also likely contributing.  Will avoid NSAIDs given his history of CKD.  He has not tolerated prednisone in the past.  Will give 80 mg IM Depo-Medrol today.  Also start home exercise program-handout was given.  He will continue taking Tylenol and tramadol as needed.  Discussed reasons to return to care and seek emergent care.  Will refer to PT and/or sports medicine if not improving with above.  Gout Per rheumatology.  Continue Uloric 80 mg daily.  Osteoarthritis Stable.  Continue Tylenol and tramadol as needed.  Hyperlipidemia Last LDL 75.  Continue Zetia 10 mg daily and pravastatin per cardiology.  Carotid arterial disease (HCC) Continue aspirin 81 mg daily and pravastatin.  GERD Stable on Nexium 40 mg daily.  Essential hypertension At goal on current regimen of  Norvasc 10 mg daily, lisinopril 20 mg twice daily, metoprolol 50 mg daily, and hydralazine 100 mg 3 times daily.  Allergic rhinitis Continue Astelin nasal spray, Claritin 10 mg daily, and Singulair 10 mg daily.  [name]. Jerline Pain, MD 06/18/2018 10:38 AM

## 2018-06-18 NOTE — Assessment & Plan Note (Addendum)
Continue aspirin 81 mg daily and pravastatin.

## 2018-06-18 NOTE — Assessment & Plan Note (Signed)
Stable on Nexium 40 mg daily °

## 2018-06-18 NOTE — Assessment & Plan Note (Signed)
Stable.  Continue Tylenol and tramadol as needed.

## 2018-06-18 NOTE — Assessment & Plan Note (Signed)
Per rheumatology.  Continue Uloric 80 mg daily.

## 2018-06-19 ENCOUNTER — Other Ambulatory Visit: Payer: Self-pay | Admitting: Family Medicine

## 2018-06-19 MED ORDER — TRAMADOL HCL 50 MG PO TABS: 50.0000 mg | ORAL_TABLET | Freq: Two times a day (BID) | ORAL | 0 refills | Status: DC

## 2018-06-19 NOTE — Telephone Encounter (Signed)
This drug not delegated to NT to refill

## 2018-06-19 NOTE — Telephone Encounter (Signed)
Please advise 

## 2018-06-19 NOTE — Telephone Encounter (Signed)
Copied from [place] [id number]. Topic: Quick Communication - Rx Refill/Question >> Jun 19, 2018  9:01 AM Sheran Luz wrote: Medication: traMADol (ULTRAM) 50 MG tablet   Patient is requesting a refill of this medication.  Preferred Pharmacy (with phone number or street name):[id number] - [place], [place] - [address] [phone] (Phone) [phone] (Fax)

## 2018-06-19 NOTE — Telephone Encounter (Signed)
See note

## 2018-06-19 NOTE — Addendum Note (Signed)
Addended by: Elmer Bales on: 06/19/2018 09:41 AM   Modules accepted: Orders

## 2018-06-21 ENCOUNTER — Other Ambulatory Visit: Payer: Self-pay | Admitting: Adult Health

## 2018-06-22 ENCOUNTER — Telehealth: Payer: Self-pay | Admitting: Family Medicine

## 2018-06-22 ENCOUNTER — Other Ambulatory Visit: Payer: Self-pay

## 2018-06-22 MED ORDER — AMLODIPINE BESYLATE 10 MG PO TABS: 10.0000 mg | ORAL_TABLET | Freq: Every day | ORAL | 2 refills | Status: DC

## 2018-06-22 NOTE — Telephone Encounter (Signed)
[place] with refill.  Recommend taking his tramadol 50mg  q8hrs prn if he is not already. If he is doing that and pain is not improving, would recommend having him come in to see Dr Paulla Fore.  [name]. Jerline Pain, MD 06/22/2018 2:26 PM

## 2018-06-22 NOTE — Telephone Encounter (Signed)
Copied from [place] [id number]. Topic: General - Other >> Jun 22, 2018  8:22 AM Carolyn Stare wrote:  Pt call to say Dr Jerline Pain diag him with having a sciatic and he said he is still in pain and is asking if maybe he need something else , he had sone everything DR Jerline Pain suggested he do   He also ask for a RX to be refill since he is new to Dr Jerline Pain amLODipine (NORVASC) 10 MG tablet   [facility]

## 2018-06-22 NOTE — Telephone Encounter (Signed)
Please advise 

## 2018-06-23 ENCOUNTER — Other Ambulatory Visit: Payer: Self-pay

## 2018-06-23 ENCOUNTER — Encounter: Payer: Self-pay | Admitting: Family Medicine

## 2018-06-23 ENCOUNTER — Telehealth: Payer: Self-pay | Admitting: Family Medicine

## 2018-06-23 ENCOUNTER — Ambulatory Visit: Payer: BLUE CROSS/BLUE SHIELD | Admitting: Family Medicine

## 2018-06-23 VITALS — BP 146/84 | HR 68 | Temp 98.4°F | Ht 66.0 in | Wt 238.0 lb

## 2018-06-23 DIAGNOSIS — M5441 Lumbago with sciatica, right side: Secondary | ICD-10-CM | POA: Diagnosis not present

## 2018-06-23 DIAGNOSIS — N182 Chronic kidney disease, stage 2 (mild): Secondary | ICD-10-CM

## 2018-06-23 DIAGNOSIS — G8929 Other chronic pain: Secondary | ICD-10-CM | POA: Diagnosis not present

## 2018-06-23 MED ORDER — DICLOFENAC SODIUM 50 MG PO TBEC: 50.0000 mg | DELAYED_RELEASE_TABLET | Freq: Two times a day (BID) | ORAL | 0 refills | Status: DC

## 2018-06-23 MED ORDER — KETOROLAC TROMETHAMINE 60 MG/2ML IM SOLN
30.0000 mg | Freq: Once | INTRAMUSCULAR | Status: AC
  Administered 2018-06-23: 30 mg via INTRAMUSCULAR

## 2018-06-23 MED ORDER — KETOROLAC TROMETHAMINE 30 MG/ML IJ SOLN: 30.0000 mg | Freq: Once | INTRAMUSCULAR | Status: DC

## 2018-06-23 MED ORDER — AMLODIPINE BESYLATE 10 MG PO TABS: 10.0000 mg | ORAL_TABLET | Freq: Every day | ORAL | 0 refills | Status: DC

## 2018-06-23 MED ORDER — TIZANIDINE HCL 4 MG PO TABS: 4.0000 mg | ORAL_TABLET | Freq: Four times a day (QID) | ORAL | 0 refills | Status: DC | PRN

## 2018-06-23 MED ORDER — DICLOFENAC SODIUM 1 % TD GEL: 2.0000 g | Freq: Four times a day (QID) | TRANSDERMAL | 3 refills | Status: DC

## 2018-06-23 NOTE — Addendum Note (Signed)
Addended by: Elmer Bales on: 06/23/2018 01:30 PM   Modules accepted: Orders

## 2018-06-23 NOTE — Telephone Encounter (Signed)
See note

## 2018-06-23 NOTE — Telephone Encounter (Signed)
Patient stated he is using Tramadol,IBU,ice and heat,still having pain. Patient has an appt. scheduled 06/23/18

## 2018-06-23 NOTE — Telephone Encounter (Signed)
Copied from [place] [id number]. Topic: Quick Communication - See Telephone Encounter >> Jun 23, 2018  3:09 PM Sheran Luz wrote: CRM for notification. See Telephone encounter for: 06/23/18.  Gloris Ham, Pharmacy tech with [employer], calling to inquire if patients lisinopril (PRINIVIL,ZESTRIL) 20 MG tablet 2x a day can be switched to 40 mg a day 1x a day to simplify for patient. Please advise.

## 2018-06-23 NOTE — Telephone Encounter (Signed)
Please advise 

## 2018-06-23 NOTE — Telephone Encounter (Signed)
Ok with me if he wants to switch.  [name]. Jerline Pain, MD 06/23/2018 4:55 PM

## 2018-06-23 NOTE — Patient Instructions (Signed)
It was very nice to see you today!  We will give you an injection of an anti-inflammatory called Toradol today.  Please do not take any anti-inflammatories for the rest of the day.  Please take Tylenol 1000 mg 3 times daily.   I will send in a prescription for Zanaflex.  You can use this 3 times daily as needed.  Please take your tramadol every 6-8 hours as needed.  Tomorrow you can start the oral diclofenac twice daily.  Please do not take this more than a week.  Please come back to see Dr. Paulla Fore soon if this flares up again.  Take care, Dr Jerline Pain

## 2018-06-23 NOTE — Progress Notes (Signed)
   Subjective:  Todd Spears is a 73 y.o. male who presents today for same-day appointment with a chief complaint of back pain.   HPI:  Back Pain, Acute problem Last seen about 5 days ago for this. Received 80mg  of DepoMedrol which did not help with his symptoms. He has been taking tramadol and tylenol without significant improvement.  Pain has progressed over the last couple days.  Now has some difficulty walking due to the pain.  He has been taking ibuprofen which has not helped.  He has not tolerated prednisone in the past. No other obvious alleviating or aggravating factors.  No weakness or numbness.  No bowel or bladder incontinence.  ROS: Per HPI  PMH: He reports that he quit smoking about 43 years ago. He has never used smokeless tobacco. He reports that he does not drink alcohol or use drugs.  Objective:  Physical Exam: BP (!) 146/84 (BP Location: Left Arm, Patient Position: Sitting, Cuff Size: Normal)   Pulse 68   Temp 98.4 F (36.9 C) (Oral)   Ht 5\' 6"  (1.676 m)   Wt 238 lb (108 kg)   SpO2 97%   BMI 38.41 kg/m   Gen: NAD, resting comfortably CV: RRR with no murmurs appreciated Pulm: NWOB, CTAB with no crackles, wheezes, or rhonchi MSK: -Back: No deformities.  Tender palpation along superior right gluteal muscle group.  No midline tenderness. -Lower extremities: No deformities.  Strength 5 out of 5 throughout.  Straight leg raise test negative bilaterally.  Assessment/Plan:  Low back pain Chronic problem.  Worsened over the past 5 days.  No red flags.  He has not been able to tolerate systemic steroids in the past.  He has CKD stage II.  Engaged in shared decision making with patient.  Given the severe degree of pain and that it is impacting his quality of life, we will try very short course of NSAIDs.  Will give 30 mg of IM Toradol today.  Instructed patient to not take any NSAIDs for the rest of the day.  Starting tomorrow, he will take diclofenac 50 mg twice daily for  the next week.  Also recommended starting scheduled Tylenol 1000 mg every 8 hours.  Also send in Zanaflex 4 mg 3 times daily as needed.  Recommended heating pad as needed to the area.  Also send in topical diclofenac gel to use as needed.  Discussed strict reasons to return to care.  Recommended follow-up with sports medicine orthopedics if not improving.  CKD Stage 2 See above.  Shared decision making today-discussed possible worsening of renal function while on NSAIDs with patient. Encouraged good oral hydration.  Advised patient to take NSAIDs for only the next week.  Discussed reasons return to care.  [name]. Jerline Pain, MD 06/23/2018 12:31 PM

## 2018-06-24 ENCOUNTER — Emergency Department (HOSPITAL_BASED_OUTPATIENT_CLINIC_OR_DEPARTMENT_OTHER)
Admission: EM | Admit: 2018-06-24 | Discharge: 2018-06-24 | Disposition: A | Discharge status: Home / Self Care | Payer: BLUE CROSS/BLUE SHIELD | Attending: Emergency Medicine | Admitting: Emergency Medicine

## 2018-06-24 ENCOUNTER — Other Ambulatory Visit: Payer: Self-pay

## 2018-06-24 ENCOUNTER — Ambulatory Visit: Payer: Self-pay | Admitting: *Deleted

## 2018-06-24 ENCOUNTER — Encounter (HOSPITAL_BASED_OUTPATIENT_CLINIC_OR_DEPARTMENT_OTHER): Payer: Self-pay | Admitting: Emergency Medicine

## 2018-06-24 DIAGNOSIS — R531 Weakness: Secondary | ICD-10-CM | POA: Insufficient documentation

## 2018-06-24 DIAGNOSIS — I129 Hypertensive chronic kidney disease with stage 1 through stage 4 chronic kidney disease, or unspecified chronic kidney disease: Secondary | ICD-10-CM | POA: Diagnosis not present

## 2018-06-24 DIAGNOSIS — M5116 Intervertebral disc disorders with radiculopathy, lumbar region: Secondary | ICD-10-CM

## 2018-06-24 DIAGNOSIS — Z87891 Personal history of nicotine dependence: Secondary | ICD-10-CM | POA: Insufficient documentation

## 2018-06-24 DIAGNOSIS — J45909 Unspecified asthma, uncomplicated: Secondary | ICD-10-CM | POA: Diagnosis not present

## 2018-06-24 DIAGNOSIS — I2510 Atherosclerotic heart disease of native coronary artery without angina pectoris: Secondary | ICD-10-CM | POA: Insufficient documentation

## 2018-06-24 DIAGNOSIS — M5441 Lumbago with sciatica, right side: Secondary | ICD-10-CM

## 2018-06-24 DIAGNOSIS — N182 Chronic kidney disease, stage 2 (mild): Secondary | ICD-10-CM | POA: Insufficient documentation

## 2018-06-24 DIAGNOSIS — Z7982 Long term (current) use of aspirin: Secondary | ICD-10-CM | POA: Insufficient documentation

## 2018-06-24 DIAGNOSIS — R29898 Other symptoms and signs involving the musculoskeletal system: Secondary | ICD-10-CM

## 2018-06-24 DIAGNOSIS — R52 Pain, unspecified: Secondary | ICD-10-CM | POA: Diagnosis not present

## 2018-06-24 DIAGNOSIS — M545 Low back pain: Secondary | ICD-10-CM | POA: Diagnosis not present

## 2018-06-24 DIAGNOSIS — I10 Essential (primary) hypertension: Secondary | ICD-10-CM | POA: Diagnosis not present

## 2018-06-24 DIAGNOSIS — M6281 Muscle weakness (generalized): Secondary | ICD-10-CM | POA: Diagnosis not present

## 2018-06-24 DIAGNOSIS — Z79899 Other long term (current) drug therapy: Secondary | ICD-10-CM | POA: Diagnosis not present

## 2018-06-24 DIAGNOSIS — M25551 Pain in right hip: Secondary | ICD-10-CM | POA: Diagnosis not present

## 2018-06-24 DIAGNOSIS — M5489 Other dorsalgia: Secondary | ICD-10-CM | POA: Diagnosis not present

## 2018-06-24 MED ORDER — LISINOPRIL 40 MG PO TABS: 40.0000 mg | ORAL_TABLET | Freq: Every day | ORAL | 3 refills | Status: DC

## 2018-06-24 MED ORDER — DEXAMETHASONE SODIUM PHOSPHATE 10 MG/ML IJ SOLN
10.0000 mg | Freq: Once | INTRAMUSCULAR | Status: AC
  Administered 2018-06-24: 10 mg via INTRAMUSCULAR
  Filled 2018-06-24: qty 1

## 2018-06-24 MED ORDER — METHYLPREDNISOLONE 4 MG PO TBPK: ORAL_TABLET | ORAL | 0 refills | Status: DC

## 2018-06-24 NOTE — Telephone Encounter (Signed)
Rx sent to pharmacy   

## 2018-06-24 NOTE — Telephone Encounter (Signed)
New patient of Dr. Jerline Pain and is currently being treated with sciatica nerve. LOV on 06/23/18.Pt was seen in the office on yesterday and was given prescriptions for anti inflammatories and muscle relaxers. Pt states he almost falls with getting up and with taking 3 steps onto back deck he had to hold onto the railing. Pt states he can't hardly raise right leg. States he can not lift it up and leg feels like it is jelly. No numbness,tingling or pain in leg. Pt states he got out of care today and could not raise leg high enough to get into SUV. Pt's wife  asking if MRI could be ordered.Pt advised to seek treatment in the ED for current symptoms. Pt verbalized understanding but states he does not know if he will go to the ED tonight.  Reason for Disposition . [1] SEVERE weakness (i.e., unable to walk or barely able to walk, requires support) AND     [2] new onset or worsening  Answer Assessment - Initial Assessment Questions 1. DESCRIPTION: "Describe how you are feeling."    Pt states that beginning today weakness in right leg increased to the point that he felt he would fall if he stood up 2. SEVERITY: "How bad is it?"  "Can you stand and walk?"   - MILD - Feels weak or tired, but does not interfere with work, school or normal activities   - Union to stand and walk; weakness interferes with work, school, or normal activities   - SEVERE - Unable to stand or walk     Severe, pt states his leg feels like jelly 3. ONSET:  "When did the weakness begin?"     Increased today 4. CAUSE: "What do you think is causing the weakness?"     Unsure 5. MEDICINES: "Have you recently started a new medicine or had a change in the amount of a medicine?"     Pt was recently given prescriptions for muscle relaxers and anti inflammatories during OV on 06/23/18 6. OTHER SYMPTOMS: "Do you have any other symptoms?" (e.g., chest pain, fever, cough, SOB, vomiting, diarrhea, bleeding, other areas of pain)      No  Protocols used: WEAKNESS (GENERALIZED) AND FATIGUE-A-AH

## 2018-06-24 NOTE — ED Provider Notes (Signed)
[facility] EMERGENCY DEPARTMENT Provider Note   CSN: [id number] Arrival date & time: 06/24/18  1947     History   Chief Complaint Chief Complaint  Patient presents with  . Back Pain    HPI Todd Spears is a 73 y.o. male.  HPI   When sitting or lying down is tolerating 2weeks ago Thursday it started Started at the top of the right hip and buttocks on the right, down the back of the right leg, knee and down Saw regular physician on Thursday of last week and was given cortizone shot and given tramadol and compresses and ice Saturday night had terrible pain and this weekend Still forced self to go to work [employer] back and saw PCP Dr. Jerline Pain yesterday because pain worsened. Given toradol shot, rx for anti-inflammatory, muscle relaxant, tramadol and tylenol. Rested last night.  When laying flat or side or sitting is tolerating. Twisting or moving makes it worse. 1AM took tylenol, this AM was able to get up and move slowly. This afternoon when leaving the office could not raise leg to get into the car, had to slide into car backwards and pull self in. Has 3 steps to go into home anded needed to hold onto railing.  Concerned that developed weakness today, worried about falls.  No numbness.  No fevers. No falls or trauma. No difficulty urinating, no loss of control of bowel or bladder. No dysuria.   No headache or other numbness or weakness.   Past Medical History:  Diagnosis Date  . Allergic rhinitis   . Anemia   . Asthma   . CAD (coronary artery disease)    a. s/p PTCA/stenting of the mid LAD and mid RCA 04/06/12  . Carotid bruit    r  . Colon polyp   . Diverticulosis   . ED (erectile dysfunction)   . Fatty liver   . GERD (gastroesophageal reflux disease)   . Gout   . Hyperlipidemia   . Hypertension   . Obesity   . Osteoarthritis   . Renal insufficiency   . Restless leg syndrome     Patient Active Problem List   Diagnosis Date Noted  . Gout 06/18/2018  .  Osteoarthritis 06/18/2018  . Carotid arterial disease ([place]) 08/10/2015  . Aortic stenosis, mild 08/10/2015  . CKD (chronic kidney disease) stage 2, GFR 60-89 ml/min 07/03/2015  . CAD s/p PCI 2013   . Hyperlipidemia 11/27/2006  . Essential hypertension 11/27/2006  . Allergic rhinitis 11/27/2006  . GERD 11/27/2006    Past Surgical History:  Procedure Laterality Date  . COLONOSCOPY    . INGUINAL HERNIA REPAIR Right 02/27/2016   Procedure: HERNIA REPAIR INGUINAL ADULT;  Surgeon: Rolm Bookbinder, MD;  Location: [facility];  Service: General;  Laterality: Right;  . INSERTION OF MESH Right 02/27/2016   Procedure: INSERTION OF MESH--IN AND OUT CATHETERIZATION PERFORMED AT END OF PROCEDURE TO DRAIN 400ML URINE.;  Surgeon: Rolm Bookbinder, MD;  Location: [facility];  Service: General;  Laterality: Right;  . LYMPH NODE BIOPSY     right side of neck 1999 - benign  . PERCUTANEOUS CORONARY STENT INTERVENTION (PCI-S) N/A 04/06/2012   Procedure: PERCUTANEOUS CORONARY STENT INTERVENTION (PCI-S);  Surgeon: Peter M Martinique, MD; Promus DES for 90% mid LAD and 95% mid RCA disease          Home Medications    Prior to Admission medications   Medication Sig Start Date End Date Taking? Authorizing  Provider  albuterol (PROAIR HFA) 108 (90 Base) MCG/ACT inhaler INHALE 2 PUFFS INTO THE LUNGS EVERY 6 (SIX) HOURS AS NEEDED. FOR SHORTNESS OF BREATH 08/05/17   Nafziger, Tommi Rumps, NP  amLODipine (NORVASC) 10 MG tablet Take 1 tablet (10 mg total) by mouth daily. 06/22/18   Vivi Barrack, MD  amLODipine (NORVASC) 10 MG tablet Take 1 tablet (10 mg total) by mouth daily. 06/23/18   Vivi Barrack, MD  aspirin 81 MG chewable tablet Chew 81 mg by mouth daily.    [provider]  azelastine (ASTELIN) 0.1 % nasal spray Place 2 sprays into both nostrils 2 (two) times daily. Use in each nostril as directed 04/02/18   Nafziger, Tommi Rumps, NP  cetirizine (ZYRTEC) 10 MG tablet Take 10 mg by mouth  every morning.    [provider]  COLCRYS 0.6 MG tablet Take 0.6 mg by mouth daily as needed (for gout flareups). Reported on 10/26/2015 04/09/12   [provider]  Cyanocobalamin (VITAMIN B 12 PO) Take 1 tablet by mouth daily.     [provider]  diclofenac (VOLTAREN) 50 MG EC tablet Take 1 tablet (50 mg total) by mouth 2 (two) times daily. 06/23/18   Vivi Barrack, MD  diclofenac sodium (VOLTAREN) 1 % GEL Apply 2 g topically 4 (four) times daily. 06/23/18   Vivi Barrack, MD  esomeprazole (NEXIUM) 40 MG capsule TAKE 1 CAPSULE DAILY BEFOREBREAKFAST 05/18/18   Nafziger, Tommi Rumps, NP  ezetimibe (ZETIA) 10 MG tablet TAKE 1 TABLET BY MOUTH EVERY DAY 03/24/18   Martinique, Peter M, MD  FeFum-FePoly-FA-B Cmp-C-Biot (INTEGRA PLUS) CAPS Take 1 tablet by mouth daily. 07/04/14   [provider]  halobetasol (ULTRAVATE) 0.05 % cream Apply topically 2 (two) times daily. 05/16/17   Nafziger, Tommi Rumps, NP  hydrALAZINE (APRESOLINE) 50 MG tablet Take 2 tablets (100 mg total) by mouth 3 (three) times daily. 04/06/18   Martinique, Peter M, MD  lisinopril (PRINIVIL,ZESTRIL) 40 MG tablet Take 1 tablet (40 mg total) by mouth daily. 06/24/18   Vivi Barrack, MD  loratadine (CLARITIN) 10 MG tablet Take 10 mg by mouth at bedtime.     [provider]  methylPREDNISolone (MEDROL DOSEPAK) 4 MG TBPK tablet Per pack instructions. 06/24/18   Gareth Morgan, MD  metoprolol succinate (TOPROL-XL) 50 MG 24 hr tablet TAKE 1 TABLET DAILY WITH ORIMMEDIATELY FOLLOWING A    MEAL 04/27/18   Nafziger, Tommi Rumps, NP  mometasone (NASONEX) 50 MCG/ACT nasal spray Place 1-2 sprays into the nose as needed. 04/02/16   [provider]  montelukast (SINGULAIR) 10 MG tablet Take 1 tablet (10 mg total) by mouth at bedtime. 02/25/18   Nafziger, Tommi Rumps, NP  Multiple Vitamin (MULTIVITAMIN WITH MINERALS) TABS tablet Take 1 tablet by mouth daily.    [provider]  Multiple Vitamins-Minerals (VISION-VITE PRESERVE  PO) Take 1 tablet by mouth 2 (two) times daily.     [provider]  nitroGLYCERIN (NITROSTAT) 0.4 MG SL tablet Place 1 tablet (0.4 mg total) under the tongue every 5 (five) minutes as needed for chest pain (up to 3 doses. not within 24hrs of Viagra). 11/21/14   Martinique, Peter M, MD  pravastatin (PRAVACHOL) 10 MG tablet TAKE 1 TABLET EVERY OTHER DAY, ALTERNATING WITH 2 TABLETS. 05/15/18   Martinique, Peter M, MD  Saline (SIMPLY SALINE) 0.9 % AERS Place 1 spray into the nose daily as needed (congestion).     [provider]  tiZANidine (ZANAFLEX) 4 MG tablet Take  1 tablet (4 mg total) by mouth every 6 (six) hours as needed for muscle spasms. 06/23/18   Vivi Barrack, MD  traMADol (ULTRAM) 50 MG tablet Take 1 tablet (50 mg total) by mouth every 12 (twelve) hours. 06/19/18   Vivi Barrack, MD  ULORIC 80 MG TABS Take 1 tablet by mouth daily. Reported on 10/26/2015 07/25/14   [provider]    Family History Family History  Problem Relation Age of Onset  . Coronary artery disease Father        in his 73s  . Heart disease Father   . Heart attack Father   . Hypertension Mother   . Stroke Mother        Several   . Stomach cancer Mother   . Glaucoma Mother   . Hypertension Paternal Grandmother   . Hypertension Paternal Grandfather   . Colon cancer Neg Hx   . Esophageal cancer Neg Hx   . Rectal cancer Neg Hx     Social History Social History   Tobacco Use  . Smoking status: Former Smoker    Last attempt to quit: 06/04/1975    Years since quitting: 43.0  . Smokeless tobacco: Never Used  Substance Use Topics  . Alcohol use: No    Alcohol/week: 0.0 standard drinks  . Drug use: No     Allergies   Prednisone; Crestor [rosuvastatin calcium]; and Lyrica [pregabalin]   Review of Systems Review of Systems  Musculoskeletal: Positive for back pain.  Neurological: Positive for weakness.     Physical Exam Updated Vital Signs BP (!) 158/66 (BP Location: Right Arm)    Pulse 66   Temp 98.1 F (36.7 C) (Oral)   Resp 16   SpO2 97%   Physical Exam Vitals signs and nursing note reviewed.  Constitutional:      General: He is not in acute distress.    Appearance: He is well-developed. He is not diaphoretic.  HENT:     Head: Normocephalic and atraumatic.  Eyes:     Conjunctiva/sclera: Conjunctivae normal.  Neck:     Musculoskeletal: Normal range of motion.  Cardiovascular:     Rate and Rhythm: Normal rate and regular rhythm.     Heart sounds: Normal heart sounds. No murmur. No friction rub. No gallop.   Pulmonary:     Effort: Pulmonary effort is normal. No respiratory distress.     Breath sounds: Normal breath sounds. No wheezing or rales.  Abdominal:     General: There is no distension.     Palpations: Abdomen is soft.     Tenderness: There is no abdominal tenderness. There is no guarding.  Musculoskeletal:     Right hip: Decreased range of motion: pain with lifting leg.     Lumbar back: He exhibits tenderness and bony tenderness.  Skin:    General: Skin is warm and dry.  Neurological:     Mental Status: He is alert and oriented to person, place, and time.     GCS: GCS eye subscore is 4. GCS verbal subscore is 5. GCS motor subscore is 6.     Motor: Weakness (right leg) present.     Comments: Weakness hip flexion right leg, normal knee extension, plantar and dorsi flexion. Normal sensation.       ED Treatments / Results  Labs (all labs ordered are listed, but only abnormal results are displayed) Labs Reviewed - No data to display  EKG None  Radiology No results found.  Procedures Procedures (including critical care time)  Medications Ordered in ED Medications  dexamethasone (DECADRON) injection 10 mg (10 mg Intramuscular Given 06/24/18 2249)     Initial Impression / Assessment and Plan / ED Course  I have reviewed the triage vital signs and the nursing notes.  Pertinent labs & imaging results that were available during my  care of the patient were reviewed by me and considered in my medical decision making (see chart for details).    73yo male with history above presents with concern for 2 weeks of back pain with radiation down the right leg and right leg weakness beginning today. Right leg weakness noted on exam. Patient denies any urinary retention or overflow incontinence, stool incontinence, saddle anesthesia, fever, trauma, chronic steroid use or immunocompromise and have low suspicion suspicion for cauda equina, fracture, epidural abscess, or vertebral osteomyelitis.  No dysuria, no abdominal pain, doubt pyelo or AAA. Normal pulses in LE.    Suspect nerve impingement, likely disc herniation by history and physical.  Given weakness on exam, recommended transfer to [facility] for MRI, however patient declines.  Do not see need for emergency surgery this evening given above.    Sent message to PCP. Given decadron in the ED and medrol dose pak for continued treatment. Recommend continued pain management through PCP. Given number for NSU follow up. Patient discharged in stable condition with understanding of reasons to return.   Final Clinical Impressions(s) / ED Diagnoses   Final diagnoses:  Acute right-sided low back pain with right-sided sciatica  Lumbar disc herniation with radiculopathy  Weakness of right lower extremity    ED Discharge Orders         Ordered    methylPREDNISolone (MEDROL DOSEPAK) 4 MG TBPK tablet     06/24/18 2243           Gareth Morgan, MD 06/25/18 0236

## 2018-06-24 NOTE — ED Triage Notes (Signed)
PT seen by PMD yesterday and received a shot of toradol. Today increased pain  in Right leg , unable to ambulate without severe pain.

## 2018-06-24 NOTE — ED Triage Notes (Signed)
Back pain for 2 weeks, seen by PMD a week ago for cortisone shot. Increased pain today radiating down right leg. EMS transport from home

## 2018-06-24 NOTE — ED Notes (Signed)
ED Provider at bedside.Had a cortisone shot last Thursday at his PCP's office with some relief and this Monday his back pain is getting worst and went to his PCP again and got a Toradol shot and was prescribed with Tramadol and Tylenol with no relief.  He has difficulty moving his right leg.  Denies injury to his back.

## 2018-06-25 ENCOUNTER — Ambulatory Visit (HOSPITAL_COMMUNITY)
Admission: RE | Admit: 2018-06-25 | Discharge: 2018-06-25 | Disposition: A | Discharge status: Home / Self Care | Payer: BLUE CROSS/BLUE SHIELD | Source: Ambulatory Visit | Attending: Family Medicine | Admitting: Family Medicine

## 2018-06-25 ENCOUNTER — Encounter (HOSPITAL_COMMUNITY): Payer: Self-pay | Admitting: Radiology

## 2018-06-25 ENCOUNTER — Telehealth: Payer: Self-pay | Admitting: Family Medicine

## 2018-06-25 ENCOUNTER — Other Ambulatory Visit: Payer: Self-pay

## 2018-06-25 DIAGNOSIS — M5441 Lumbago with sciatica, right side: Secondary | ICD-10-CM

## 2018-06-25 DIAGNOSIS — M48061 Spinal stenosis, lumbar region without neurogenic claudication: Secondary | ICD-10-CM | POA: Diagnosis not present

## 2018-06-25 NOTE — Telephone Encounter (Signed)
Can we let pt know they need an MRI before his appointment? If he is ok with it, please contact stephanie about seeing if we can get it set up and place order for L spine without contrast for low back pain with sciatica.  Todd Spears. Jerline Pain, MD 06/25/2018 12:28 PM

## 2018-06-25 NOTE — Telephone Encounter (Signed)
See note  Copied from [place] [id number]. Topic: Referral - Request for Referral >> Jun 25, 2018 10:31 AM Lennox Solders wrote: Has patient seen PCP for this complaint? Yes. [facility]  [address] pt saw dr Gareth Morgan. Pt has an appt with dr Christella Noa on 06-29-2018 and they will like pt to  have mri prior to appt. Pt needs mri of back. Pt right leg was weak yesterday. Pt is having trouble walking

## 2018-06-25 NOTE — Telephone Encounter (Signed)
Please advise 

## 2018-06-25 NOTE — Telephone Encounter (Signed)
Noted. Please see other phone note.

## 2018-06-25 NOTE — Telephone Encounter (Signed)
MRI order has been placed.  [facility] [place] is working on Investment banker, operational.

## 2018-06-25 NOTE — Telephone Encounter (Signed)
Please advise on MRI.  Patient seen in ED.  Declined transfer to [facility] for MRI.

## 2018-06-25 NOTE — Telephone Encounter (Signed)
See note

## 2018-06-26 ENCOUNTER — Ambulatory Visit: Payer: BLUE CROSS/BLUE SHIELD | Admitting: Physician Assistant

## 2018-06-26 ENCOUNTER — Encounter

## 2018-06-26 NOTE — Progress Notes (Signed)
Please inform patient of the following:  His MRI showed progressive degenerative disc disease with some nerve impingement. Would like for him to continue his current treatment plan and follow up with neurosurgery as scheduled.  Todd Spears. Jerline Pain, MD 06/26/2018 9:08 AM

## 2018-06-29 ENCOUNTER — Telehealth: Payer: Self-pay | Admitting: Cardiology

## 2018-06-29 DIAGNOSIS — M48062 Spinal stenosis, lumbar region with neurogenic claudication: Secondary | ICD-10-CM | POA: Diagnosis not present

## 2018-06-29 DIAGNOSIS — Z6837 Body mass index (BMI) 37.0-37.9, adult: Secondary | ICD-10-CM | POA: Diagnosis not present

## 2018-06-29 DIAGNOSIS — I10 Essential (primary) hypertension: Secondary | ICD-10-CM | POA: Diagnosis not present

## 2018-06-29 NOTE — Telephone Encounter (Signed)
Noted. Will be on the lookout to place information into system for preop.

## 2018-06-29 NOTE — Telephone Encounter (Signed)
New Message   Pt is calling because he saw a Neuro surgeon and he will need a medical clearance to have surgery. The Neuro office will be calling or faxing the information for his upcoming procedure

## 2018-06-30 ENCOUNTER — Telehealth: Payer: Self-pay | Admitting: *Deleted

## 2018-06-30 NOTE — Telephone Encounter (Signed)
   [facility] Pre-operative Risk Assessment    Request for surgical clearance:  1. What type of surgery is being performed? L2-3 L3-4 L4-5 LAMINECTOMY/FORAMINOTOMY  2. When is this surgery scheduled?  TBD  3. What type of clearance is required (medical clearance vs. Pharmacy clearance to hold med vs. Both)?MEDICAL  4. Are there any medications that need to be held prior to surgery and how long? ASPIRIN  5. Practice name and name of physician performing surgery?  [facility]; DR Marylyn Ishihara CABBELL   6. What is your office phone number [phone]   7.   What is your office fax number [place]  8.   Anesthesia type (None, local, MAC, general) ? GENERAL   Raiford Simmonds 06/30/2018, 5:30 PM  _________________________________________________________________   (provider comments below)

## 2018-07-02 ENCOUNTER — Telehealth: Payer: Self-pay | Admitting: Cardiology

## 2018-07-02 NOTE — Telephone Encounter (Signed)
New Message        Patient is calling to check status on his surgery clearance. Pls call and advise

## 2018-07-02 NOTE — Telephone Encounter (Signed)
Spoke to patient Dr.Jordan advised ok for you to have upcoming Laminectomy/Foraminotomy.Advised ok to hold aspirin before surgery.I will call Dr.Cabbell's office in the morning and make them aware.

## 2018-07-02 NOTE — Telephone Encounter (Signed)
Spoke with patient and he is a lot of pain and can not be scheduled for his L2-3 L3-4 L4-5 LAMINECTOMY/FORAMINOTOMY until cleared. Advised patient the request was received and it will be addressed as soon as possible. Patient appreciated call back.

## 2018-07-03 ENCOUNTER — Telehealth: Payer: Self-pay

## 2018-07-03 NOTE — Telephone Encounter (Signed)
   [facility] Pre-operative Risk Assessment    Request for surgical clearance:  1. What type of surgery is being performed? L2-3 L3-4 L4-5 Laminectomy/Foraminotomy  2. When is this surgery scheduled? TBD  3. What type of clearance is required (medical clearance vs. Pharmacy clearance to hold med vs. Both)? Both  4. Are there any medications that need to be held prior to surgery and how long? Aspirin  5. Practice name and name of physician performing surgery? [facility]  Dr.Kyle Cabbell   6. What is your office phone number [phone]   7.   What is your office fax number [phone]  Attn Janett Billow  8.   Anesthesia type General    Spoke to Martinique he advised patient is cleared to have upcoming surgery.May hold aspirin prior to surgery.Faxed to [facility] office attention Janett Billow.   Thedore Mins Pugh 07/03/2018, 9:27 AM  _________________________________________________________________   (provider comments below)

## 2018-07-03 NOTE — Telephone Encounter (Signed)
See note by Elly Modena LPN- I will close encounter.  Kerin Ransom [name] 07/03/2018 9:03 AM

## 2018-07-09 DIAGNOSIS — M48062 Spinal stenosis, lumbar region with neurogenic claudication: Secondary | ICD-10-CM | POA: Diagnosis not present

## 2018-08-05 ENCOUNTER — Ambulatory Visit: Payer: BLUE CROSS/BLUE SHIELD | Admitting: Podiatry

## 2018-08-05 ENCOUNTER — Encounter: Payer: Self-pay | Admitting: Podiatry

## 2018-08-05 DIAGNOSIS — Q828 Other specified congenital malformations of skin: Secondary | ICD-10-CM | POA: Diagnosis not present

## 2018-08-05 DIAGNOSIS — B351 Tinea unguium: Secondary | ICD-10-CM

## 2018-08-05 DIAGNOSIS — M79675 Pain in left toe(s): Secondary | ICD-10-CM

## 2018-08-05 DIAGNOSIS — M216X9 Other acquired deformities of unspecified foot: Secondary | ICD-10-CM

## 2018-08-05 DIAGNOSIS — M79674 Pain in right toe(s): Secondary | ICD-10-CM | POA: Diagnosis not present

## 2018-08-05 NOTE — Progress Notes (Signed)
Patient ID: Todd Spears, male   DOB: 08/10/1945, 73 y.o.   MRN: [id number] Complaint:  Visit Type: Patient returns to my office for continued preventative foot care services. Complaint: Patient states" my nails have grown long and thick and become painful to walk and wear shoes". The patient presents for preventative foot care services. No changes to ROS.  Painful callus under the outside ball of both feet.  Podiatric Exam: Vascular: dorsalis pedis and posterior tibial pulses are palpable bilateral. Capillary return is immediate. Temperature gradient is WNL. Skin turgor WNL  Sensorium: Normal Semmes Weinstein monofilament test. Normal tactile sensation bilaterally. Nail Exam: Pt has thick disfigured discolored nails with subungual debris noted bilateral entire nail hallux through fifth toenails Ulcer Exam: There is no evidence of ulcer or pre-ulcerative changes or infection. Orthopedic Exam: Muscle tone and strength are WNL. No limitations in general ROM. No crepitus or effusions noted. HAV left with overlapping second.  HAV right foot. Skin:  Porokeratosis sub 5th metatarsal head both feet.. No infection or ulcers.  Porokeratosis sub 4 right. Diagnosis:  Onychomycosis, , Pain in right toe, pain in left toes. Porokeratosis B/L  Treatment & Plan Procedures and Treatment: Consent by patient was obtained for treatment procedures. The patient understood the discussion of treatment and procedures well. All questions were answered thoroughly reviewed. Debridement of mycotic and hypertrophic toenails, 1 through 5 bilateral and clearing of subungual debris. No ulceration, no infection noted.  Return Visit-Office Procedure: Patient instructed to return to the office for a follow up visit 3 months for continued evaluation and treatment.  Gissel Keilman DPM 

## 2018-08-18 ENCOUNTER — Ambulatory Visit (HOSPITAL_COMMUNITY): Payer: BLUE CROSS/BLUE SHIELD

## 2018-09-16 ENCOUNTER — Encounter (HOSPITAL_COMMUNITY): Payer: BLUE CROSS/BLUE SHIELD

## 2018-09-16 DIAGNOSIS — H401121 Primary open-angle glaucoma, left eye, mild stage: Secondary | ICD-10-CM | POA: Diagnosis not present

## 2018-09-25 ENCOUNTER — Other Ambulatory Visit (HOSPITAL_COMMUNITY): Payer: Self-pay | Admitting: Cardiovascular Disease

## 2018-09-25 DIAGNOSIS — I6523 Occlusion and stenosis of bilateral carotid arteries: Secondary | ICD-10-CM

## 2018-10-08 ENCOUNTER — Other Ambulatory Visit: Payer: Self-pay | Admitting: Family Medicine

## 2018-10-09 NOTE — Telephone Encounter (Signed)
Rx request 

## 2018-10-16 ENCOUNTER — Other Ambulatory Visit: Payer: Self-pay

## 2018-10-16 ENCOUNTER — Ambulatory Visit (INDEPENDENT_AMBULATORY_CARE_PROVIDER_SITE_OTHER): Payer: BLUE CROSS/BLUE SHIELD | Admitting: Family Medicine

## 2018-10-16 ENCOUNTER — Telehealth: Payer: Self-pay

## 2018-10-16 ENCOUNTER — Encounter: Payer: Self-pay | Admitting: Family Medicine

## 2018-10-16 ENCOUNTER — Telehealth (INDEPENDENT_AMBULATORY_CARE_PROVIDER_SITE_OTHER): Payer: BLUE CROSS/BLUE SHIELD | Admitting: Physician Assistant

## 2018-10-16 VITALS — BP 156/68 | HR 67 | Temp 98.6°F | Ht 67.0 in | Wt 234.2 lb

## 2018-10-16 VITALS — Ht 67.0 in | Wt 232.0 lb

## 2018-10-16 DIAGNOSIS — I10 Essential (primary) hypertension: Secondary | ICD-10-CM

## 2018-10-16 DIAGNOSIS — M1990 Unspecified osteoarthritis, unspecified site: Secondary | ICD-10-CM | POA: Diagnosis not present

## 2018-10-16 DIAGNOSIS — I6523 Occlusion and stenosis of bilateral carotid arteries: Secondary | ICD-10-CM

## 2018-10-16 DIAGNOSIS — H6123 Impacted cerumen, bilateral: Secondary | ICD-10-CM | POA: Diagnosis not present

## 2018-10-16 DIAGNOSIS — E785 Hyperlipidemia, unspecified: Secondary | ICD-10-CM

## 2018-10-16 DIAGNOSIS — N182 Chronic kidney disease, stage 2 (mild): Secondary | ICD-10-CM

## 2018-10-16 DIAGNOSIS — I2510 Atherosclerotic heart disease of native coronary artery without angina pectoris: Secondary | ICD-10-CM | POA: Diagnosis not present

## 2018-10-16 NOTE — Patient Instructions (Signed)
It was very nice to see you today!  We irrigated your ears today.  You can use hydrogen peroxide or Debrox as needed to prevent buildup.  Please keep an eye on your blood pressure and let us know if persistently 140/90 or higher.  Take care, Dr Jerline Pain

## 2018-10-16 NOTE — Assessment & Plan Note (Signed)
Stable.  Continue tramadol and Tylenol as needed.

## 2018-10-16 NOTE — Progress Notes (Signed)
Virtual Visit via Telephone Note   This visit type was conducted due to national recommendations for restrictions regarding the COVID-19 Pandemic (e.g. social distancing) in an effort to limit this patient's exposure and mitigate transmission in our community.  Due to his co-morbid illnesses, this patient is at least at moderate risk for complications without adequate follow up.  This format is felt to be most appropriate for this patient at this time.  The patient did not have access to video technology/had technical difficulties with video requiring transitioning to audio format only (telephone).  All issues noted in this document were discussed and addressed.  No physical exam could be performed with this format.  Please refer to the patient's chart for his  consent to telehealth for [facility].   Date:  10/16/2018   ID:  [name], DOB [birth year], MRN [id number]  Patient Location: Home Provider Location: Home  PCP:  Vivi Barrack, MD  Cardiologist:  Peter Martinique, MD  Electrophysiologist:  None   Evaluation Performed:  Follow-Up Visit  Chief Complaint:  followup  History of Present Illness:    Todd Spears is a 73 y.o. male with past medical history of hypertension, hyperlipidemia, restless leg syndrome, carotid artery disease, CKD stage III, and CAD.  Patient had two-vessel PCI involving mid LAD and mid RCA with drug-eluting stents in November 2013.  EF was 45% the time.  This was performed after a abnormal stress test as patient really did not have significant anginal symptoms.  Myoview in November 2014 showed fixed inferior defect without ischemia.  Repeat Myoview in February 2017 was unchanged.  Previous carotid Doppler showed 60 to 79% left ICA stenosis, 1 to 39% right ICA stenosis.  Echocardiogram in March 2017 showed normal EF, mild aortic stenosis.  He was last seen in the office in November 2019 by Dr. Martinique for follow-up, at which time he was doing well.  Patient  was contacted today via telephone visit.  Since his lumbar surgery back in February, he is able to do more activity and drive around without any issue.  He is still working at this time denies any exertional chest discomfort or shortness of breath.  His last lipid panel obtained in October 2019 showed borderline elevated LDL.  He will need a repeat lipid panel prior to the next visit in 6 months.  He is also due for repeat carotid Doppler as well, this is currently scheduled for near the end of this year.  Overall he is doing very well from cardiology perspective and can follow-up in 6 months.  The patient does not have symptoms concerning for COVID-19 infection (fever, chills, cough, or new shortness of breath).    Past Medical History:  Diagnosis Date   Allergic rhinitis    Anemia    Asthma    CAD (coronary artery disease)    a. s/p PTCA/stenting of the mid LAD and mid RCA 04/06/12   Carotid bruit    r   Colon polyp    Diverticulosis    ED (erectile dysfunction)    Fatty liver    GERD (gastroesophageal reflux disease)    Gout    Hyperlipidemia    Hypertension    Obesity    Osteoarthritis    Renal insufficiency    Restless leg syndrome    Past Surgical History:  Procedure Laterality Date   COLONOSCOPY     INGUINAL HERNIA REPAIR Right 02/27/2016   Procedure: HERNIA REPAIR INGUINAL ADULT;  Surgeon: Rolm Bookbinder, MD;  Location: [facility];  Service: General;  Laterality: Right;   INSERTION OF MESH Right 02/27/2016   Procedure: INSERTION OF MESH--IN AND OUT CATHETERIZATION PERFORMED AT END OF PROCEDURE TO DRAIN 400ML URINE.;  Surgeon: Rolm Bookbinder, MD;  Location: [facility];  Service: General;  Laterality: Right;   LYMPH NODE BIOPSY     right side of neck 1999 - benign   PERCUTANEOUS CORONARY STENT INTERVENTION (PCI-S) N/A 04/06/2012   Procedure: PERCUTANEOUS CORONARY STENT INTERVENTION (PCI-S);  Surgeon: Peter M Martinique, MD;  Promus DES for 90% mid LAD and 95% mid RCA disease       No outpatient medications have been marked as taking for the 10/16/18 encounter (Telemedicine) with Almyra Deforest, [place].     Allergies:   Prednisone; Crestor [rosuvastatin calcium]; and Lyrica [pregabalin]   Social History   Tobacco Use   Smoking status: Former Smoker    Last attempt to quit: 06/04/1975    Years since quitting: 43.3   Smokeless tobacco: Never Used  Substance Use Topics   Alcohol use: No    Alcohol/week: 0.0 standard drinks   Drug use: No     Family Hx: The patient's family history includes Coronary artery disease in his father; Glaucoma in his mother; Heart attack in his father; Heart disease in his father; Hypertension in his mother, paternal grandfather, and paternal grandmother; Stomach cancer in his mother; Stroke in his mother. There is no history of Colon cancer, Esophageal cancer, or Rectal cancer.  ROS:   Please see the history of present illness.     All other systems reviewed and are negative.   Prior CV studies:   The following studies were reviewed today:  Echo 08/25/2015 LV EF: 60% -   65% Study Conclusions  - Left ventricle: The cavity size was normal. Wall thickness was   normal. Systolic function was normal. The estimated ejection   fraction was in the range of 60% to 65%. Wall motion was normal;   there were no regional wall motion abnormalities. - Aortic valve: Moderately calcified annulus. Moderately calcified   leaflets. There was mild stenosis. - Mitral valve: There was mild regurgitation. - Left atrium: The atrium was mildly dilated.  Labs/Other Tests and Data Reviewed:    EKG:  An ECG dated 04/06/2018 was personally reviewed today and demonstrated:  Normal sinus rhythm with first-degree AV block.  Recent Labs: 04/02/2018: ALT 31; BUN 16; Creatinine, Ser 1.19; Hemoglobin 11.9; Platelets 232.0; Potassium 4.6; Sodium 130; TSH 1.83   Recent Lipid Panel Lab Results  Component  Value Date/Time   CHOL 151 04/02/2018 01:23 PM   CHOL 147 07/09/2017 08:17 AM   TRIG 135.0 04/02/2018 01:23 PM   HDL 49.00 04/02/2018 01:23 PM   HDL 63 07/09/2017 08:17 AM   CHOLHDL 3 04/02/2018 01:23 PM   LDLCALC 75 04/02/2018 01:23 PM   LDLCALC 62 07/09/2017 08:17 AM   LDLDIRECT 164.9 12/10/2010 08:10 AM    Wt Readings from Last 3 Encounters:  10/16/18 232 lb (105.2 kg)  06/23/18 238 lb (108 kg)  06/18/18 238 lb (108 kg)     Objective:    Vital Signs:  Ht 5\' 7"  (1.702 m)    Wt 232 lb (105.2 kg)    BMI 36.34 kg/m   136/67  HR 73  VITAL SIGNS:  reviewed  ASSESSMENT & PLAN:    1. CAD: He is doing well without any recent anginal symptoms.  Continue on  current therapy.   2. Hypertension: Blood pressure borderline elevated on current therapy, will hold off on adjusting medication at this time.  3. Hyperlipidemia: On pravastatin and Zetia.  Due for fasting lipid panel in 6 months.  4. Carotid artery disease: Known carotid artery disease, due for repeat Doppler near the end of this year.  5. CKD stage III: Stable on last lab work.  COVID-19 Education: The signs and symptoms of COVID-19 were discussed with the patient and how to seek care for testing (follow up with PCP or arrange E-visit).  The importance of social distancing was discussed today.  Time:   Today, I have spent 7 minutes with the patient with telehealth technology discussing the above problems.     Medication Adjustments/Labs and Tests Ordered: Current medicines are reviewed at length with the patient today.  Concerns regarding medicines are outlined above.   Tests Ordered: No orders of the defined types were placed in this encounter.   Medication Changes: No orders of the defined types were placed in this encounter.   Disposition:  Follow up in 6 month(s)  Signed, Almyra Deforest, [place]  10/16/2018 1:33 PM    [facility]

## 2018-10-16 NOTE — Telephone Encounter (Signed)
Virtual Visit Pre-Appointment Phone Call  "(Name), I am calling you today to discuss your upcoming appointment. We are currently trying to limit exposure to the virus that causes COVID-19 by seeing patients at home rather than in the office."  1. "What is the BEST phone number to call the day of the visit?" - include this in appointment notes  2. "Do you have or have access to (through a family member/friend) a smartphone with video capability that we can use for your visit?" a. If yes - list this number in appt notes as "cell" (if different from BEST phone #) and list the appointment type as a VIDEO visit in appointment notes b. If no - list the appointment type as a PHONE visit in appointment notes  3. Confirm consent - "In the setting of the current Covid19 crisis, you are scheduled for a PHONE visit with your provider on 10/16/2018 at .  Just as we do with many in-office visits, in order for you to participate in this visit, we must obtain consent.  If you'd like, I can send this to your mychart (if signed up) or email for you to review.  Otherwise, I can obtain your verbal consent now.  All virtual visits are billed to your insurance company just like a normal visit would be.  By agreeing to a virtual visit, we'd like you to understand that the technology does not allow for your provider to perform an examination, and thus may limit your provider's ability to fully assess your condition. If your provider identifies any concerns that need to be evaluated in person, we will make arrangements to do so.  Finally, though the technology is pretty good, we cannot assure that it will always work on either your or our end, and in the setting of a video visit, we may have to convert it to a phone-only visit.  In either situation, we cannot ensure that we have a secure connection.  Are you willing to proceed?" STAFF: Did the patient verbally acknowledge consent to telehealth visit? Document YES/NO here:  YES  4. Advise patient to be prepared - "Two hours prior to your appointment, go ahead and check your blood pressure, pulse, oxygen saturation, and your weight (if you have the equipment to check those) and write them all down. When your visit starts, your provider will ask you for this information. If you have an Apple Watch or Kardia device, please plan to have heart rate information ready on the day of your appointment. Please have a pen and paper handy nearby the day of the visit as well."  5. Give patient instructions for MyChart download to smartphone OR Doximity/Doxy.me as below if video visit (depending on what platform provider is using)  6. Inform patient they will receive a phone call 15 minutes prior to their appointment time (may be from unknown caller ID) so they should be prepared to answer    TELEPHONE CALL NOTE  Todd Spears has been deemed a candidate for a follow-up tele-health visit to limit community exposure during the Covid-19 pandemic. I spoke with the patient via phone to ensure availability of phone/video source, confirm preferred email & phone number, and discuss instructions and expectations.  I reminded Todd Spears to be prepared with any vital sign and/or heart rhythm information that could potentially be obtained via home monitoring, at the time of his visit. I reminded Todd Spears to expect a phone call prior to his visit.  Jacqulynn Cadet, CMA 10/16/2018 2:00 PM   INSTRUCTIONS FOR DOWNLOADING THE MYCHART APP TO SMARTPHONE  - The patient must first make sure to have activated MyChart and know their login information - If Apple, go to [employer] and type in MyChart in the search bar and download the app. If Android, ask patient to go to [employer] and type in [place] in the search bar and download the app. The app is free but as with any other app downloads, their phone may require them to verify saved payment information or Apple/Android password.   - The patient will need to then log into the app with their MyChart username and password, and select [facility] as their healthcare provider to link the account. When it is time for your visit, go to the MyChart app, find appointments, and click Begin Video Visit. Be sure to Select Allow for your device to access the Microphone and Camera for your visit. You will then be connected, and your provider will be with you shortly.  **If they have any issues connecting, or need assistance please contact MyChart service desk [phone])**  **If using a computer, in order to ensure the best quality for their visit they will need to use either of the following Internet Browsers: [employer], or Google Chrome**  IF USING DOXIMITY or DOXY.ME - The patient will receive a link just prior to their visit by text.     FULL LENGTH CONSENT FOR TELE-HEALTH VISIT   I hereby voluntarily request, consent and authorize [facility] and its employed or contracted physicians, physician assistants, nurse practitioners or other licensed health care professionals (the Practitioner), to provide me with telemedicine health care services (the "Services") as deemed necessary by the treating Practitioner. I acknowledge and consent to receive the Services by the Practitioner via telemedicine. I understand that the telemedicine visit will involve communicating with the Practitioner through live audiovisual communication technology and the disclosure of certain medical information by electronic transmission. I acknowledge that I have been given the opportunity to request an in-person assessment or other available alternative prior to the telemedicine visit and am voluntarily participating in the telemedicine visit.  I understand that I have the right to withhold or withdraw my consent to the use of telemedicine in the course of my care at any time, without affecting my right to future care or treatment, and that  the Practitioner or I may terminate the telemedicine visit at any time. I understand that I have the right to inspect all information obtained and/or recorded in the course of the telemedicine visit and may receive copies of available information for a reasonable fee.  I understand that some of the potential risks of receiving the Services via telemedicine include:  Marland Kitchen Delay or interruption in medical evaluation due to technological equipment failure or disruption; . Information transmitted may not be sufficient (e.g. poor resolution of images) to allow for appropriate medical decision making by the Practitioner; and/or  . In rare instances, security protocols could fail, causing a breach of personal health information.  Furthermore, I acknowledge that it is my responsibility to provide information about my medical history, conditions and care that is complete and accurate to the best of my ability. I acknowledge that Practitioner's advice, recommendations, and/or decision may be based on factors not within their control, such as incomplete or inaccurate data provided by me or distortions of diagnostic images or specimens that may result from electronic transmissions. I understand that the  practice of medicine is not an Chief Strategy Officer and that Practitioner makes no warranties or guarantees regarding treatment outcomes. I acknowledge that I will receive a copy of this consent concurrently upon execution via email to the email address I last provided but may also request a printed copy by calling the office of [facility].    I understand that my insurance will be billed for this visit.   I have read or had this consent read to me. . I understand the contents of this consent, which adequately explains the benefits and risks of the Services being provided via telemedicine.  . I have been provided ample opportunity to ask questions regarding this consent and the Services and have had my questions answered to  my satisfaction. . I give my informed consent for the services to be provided through the use of telemedicine in my medical care  By participating in this telemedicine visit I agree to the above.

## 2018-10-16 NOTE — Progress Notes (Signed)
   Chief Complaint:  Todd Spears is a 73 y.o. male who presents for same day appointment with a chief complaint of ear fullness.   Assessment/Plan:  Cerumen Impaction Successfully irrigated today.  Advised patient to avoid Q-tips.  Can use over-the-counter Debrox or hydrogen peroxide as needed to prevent buildup.  Osteoarthritis Stable.  Continue tramadol and Tylenol as needed.  Essential hypertension Slightly above goal today.  Home readings are typically in the 130s over 80s.  We will continue Norvasc 10 mg daily, lisinopril 20 mg twice daily, metoprolol 50 mg daily, and hydralazine 100 mg 3 times daily.  Continue home blood pressure monitoring with goal 140/90 or lower.     Subjective:  HPI:  Ear Fullness, acute problem Started a few weeks ago. In both ears but worse in right. Associated with some decreased hearing. Similar to prior cerumen impactions. No treatments tried. No obvious alleviating or aggravating factors.   His stable, chronic medical conditions are outlined below:  # HTN - On norvasc 10mg  qd, lisinopril 20mg  bid, metoprolol 50mg  qd, hydralazine 100mg  tid and tolerating well - Home BPs: 130s/80s - ROS: No reported chest pain or shortness of breath  # CAD / Carotid Artery Disease s/p PCI in 2013 / HLD - follows with cardiology - On aspirin 81mg  daily,  Pravastatin daily, and zetia 10mg  daily and tolerating well  # Osteoarthritis - Takes tylenol and tramadol as needed.    # CKD - Follows with nephrology - Dr Ambrose Pancoast  ROS: Per HPI  PMH: He reports that he quit smoking about 43 years ago. He has never used smokeless tobacco. He reports that he does not drink alcohol or use drugs.      Objective:  Physical Exam: BP (!) 156/68 (BP Location: Left Arm, Patient Position: Sitting, Cuff Size: Normal)   Pulse 67   Temp 98.6 F (37 C) (Oral)   Ht 5\' 7"  (1.702 m)   Wt 234 lb 3.2 oz (106.2 kg)   BMI 36.68 kg/m   Gen: NAD, resting comfortably HEENT:  Bilateral EAC obscured by cerumen  Impacted Cerumen Irrigation Procedure Note Patient has impacted cerumen based on diagnostic criteria of visual exam and hearing loss.  Irrigation was recommended.  Verbal consent was obtained.  The EACs were then irrigated with successful removal of impacted cerumen.  Patient tolerated well without complications.       [name]. Jerline Pain, MD 10/16/2018 3:59 PM

## 2018-10-16 NOTE — Assessment & Plan Note (Signed)
Slightly above goal today.  Home readings are typically in the 130s over 80s.  We will continue Norvasc 10 mg daily, lisinopril 20 mg twice daily, metoprolol 50 mg daily, and hydralazine 100 mg 3 times daily.  Continue home blood pressure monitoring with goal 140/90 or lower.

## 2018-10-16 NOTE — Patient Instructions (Addendum)
Medication Instructions:   Your physician recommends that you continue on your current medications as directed. Please refer to the Current Medication list given to you today.  If you need a refill on your cardiac medications before your next appointment, please call your pharmacy.   Lab work:  You will need to come into the office in prior to your  Follow-up appointment to have labs (blood work) drawn:  CMET Fasting Lipid  If you have labs (blood work) drawn today and your tests are completely normal, you will receive your results only by: Marland Kitchen MyChart Message (if you have MyChart) OR . A paper copy in the mail If you have any lab test that is abnormal or we need to change your treatment, we will call you to review the results.  Testing/Procedures:  NONE ordered at this time of appointment   Follow-Up: At [facility], you and your health needs are our priority.  As part of our continuing mission to provide you with exceptional heart care, we have created designated Provider Care Teams.  These Care Teams include your primary Cardiologist (physician) and Advanced Practice Providers (APPs -  Physician Assistants and Nurse Practitioners) who all work together to provide you with the care you need, when you need it. You will need a follow up appointment in 6 months.  Please call our office 2 months in advance to schedule this appointment.  You may see Peter Martinique, MD or one of the following Advanced Practice Providers on your designated Care Team:   Kerin Ransom, [name] Roby Lofts, [place] . Sande Rives, [name]  Any Other Special Instructions Will Be Listed Below (If Applicable).

## 2018-10-19 ENCOUNTER — Encounter (HOSPITAL_COMMUNITY): Payer: BLUE CROSS/BLUE SHIELD

## 2018-10-19 ENCOUNTER — Telehealth: Payer: BLUE CROSS/BLUE SHIELD | Admitting: Cardiology

## 2018-11-04 ENCOUNTER — Ambulatory Visit: Payer: BLUE CROSS/BLUE SHIELD | Admitting: Podiatry

## 2018-11-18 ENCOUNTER — Other Ambulatory Visit: Payer: Self-pay

## 2018-11-18 ENCOUNTER — Encounter: Payer: Self-pay | Admitting: Podiatry

## 2018-11-18 ENCOUNTER — Ambulatory Visit: Payer: BC Managed Care – PPO | Admitting: Podiatry

## 2018-11-18 DIAGNOSIS — M79674 Pain in right toe(s): Secondary | ICD-10-CM

## 2018-11-18 DIAGNOSIS — Q828 Other specified congenital malformations of skin: Secondary | ICD-10-CM | POA: Insufficient documentation

## 2018-11-18 DIAGNOSIS — B351 Tinea unguium: Secondary | ICD-10-CM | POA: Diagnosis not present

## 2018-11-18 DIAGNOSIS — M79675 Pain in left toe(s): Secondary | ICD-10-CM

## 2018-11-18 NOTE — Progress Notes (Signed)
Patient ID: Todd Spears, male   DOB: Oct 05, 1945, 72 y.o.   MRN: [id number] Complaint:  Visit Type: Patient returns to my office for continued preventative foot care services. Complaint: Patient states" my nails have grown long and thick and become painful to walk and wear shoes". The patient presents for preventative foot care services. No changes to ROS.  Painful callus under the outside ball of both feet.  Podiatric Exam: Vascular: dorsalis pedis and posterior tibial pulses are palpable bilateral. Capillary return is immediate. Temperature gradient is WNL. Skin turgor WNL  Sensorium: Normal Semmes Weinstein monofilament test. Normal tactile sensation bilaterally. Nail Exam: Pt has thick disfigured discolored nails with subungual debris noted bilateral entire nail hallux through fifth toenails Ulcer Exam: There is no evidence of ulcer or pre-ulcerative changes or infection. Orthopedic Exam: Muscle tone and strength are WNL. No limitations in general ROM. No crepitus or effusions noted. HAV left with overlapping second.  HAV right foot. Skin:  Porokeratosis sub 5th metatarsal head both feet.. No infection or ulcers.  Porokeratosis sub 4 right. Diagnosis:  Onychomycosis, , Pain in right toe, pain in left toes. Porokeratosis B/L  Treatment & Plan Procedures and Treatment: Consent by patient was obtained for treatment procedures. The patient understood the discussion of treatment and procedures well. All questions were answered thoroughly reviewed. Debridement of mycotic and hypertrophic toenails, 1 through 5 bilateral and clearing of subungual debris. No ulceration, no infection noted.  Return Visit-Office Procedure: Patient instructed to return to the office for a follow up visit 3 months for continued evaluation and treatment.  Gardiner Barefoot DPM

## 2018-11-26 ENCOUNTER — Other Ambulatory Visit: Payer: Self-pay | Admitting: Adult Health

## 2018-11-26 DIAGNOSIS — J309 Allergic rhinitis, unspecified: Secondary | ICD-10-CM

## 2018-11-27 NOTE — Telephone Encounter (Signed)
Denied.  No longer under [facility]'s care.   PCP - General arker, [name], MD PCP - General

## 2018-11-29 ENCOUNTER — Other Ambulatory Visit: Payer: Self-pay | Admitting: Adult Health

## 2018-11-29 DIAGNOSIS — J309 Allergic rhinitis, unspecified: Secondary | ICD-10-CM

## 2018-12-01 ENCOUNTER — Other Ambulatory Visit: Payer: Self-pay

## 2018-12-01 DIAGNOSIS — J309 Allergic rhinitis, unspecified: Secondary | ICD-10-CM

## 2018-12-01 MED ORDER — ALBUTEROL SULFATE HFA 108 (90 BASE) MCG/ACT IN AERS: INHALATION_SPRAY | RESPIRATORY_TRACT | 3 refills | Status: DC

## 2018-12-18 ENCOUNTER — Other Ambulatory Visit: Payer: Self-pay | Admitting: Family Medicine

## 2019-01-05 DIAGNOSIS — H401121 Primary open-angle glaucoma, left eye, mild stage: Secondary | ICD-10-CM | POA: Diagnosis not present

## 2019-02-14 ENCOUNTER — Other Ambulatory Visit: Payer: Self-pay | Admitting: Adult Health

## 2019-02-17 ENCOUNTER — Other Ambulatory Visit: Payer: Self-pay

## 2019-02-17 ENCOUNTER — Encounter: Payer: Self-pay | Admitting: Podiatry

## 2019-02-17 ENCOUNTER — Ambulatory Visit: Payer: BC Managed Care – PPO | Admitting: Podiatry

## 2019-02-17 DIAGNOSIS — Q828 Other specified congenital malformations of skin: Secondary | ICD-10-CM | POA: Diagnosis not present

## 2019-02-17 DIAGNOSIS — M79674 Pain in right toe(s): Secondary | ICD-10-CM | POA: Diagnosis not present

## 2019-02-17 DIAGNOSIS — M79675 Pain in left toe(s): Secondary | ICD-10-CM | POA: Diagnosis not present

## 2019-02-17 DIAGNOSIS — M216X9 Other acquired deformities of unspecified foot: Secondary | ICD-10-CM

## 2019-02-17 DIAGNOSIS — B351 Tinea unguium: Secondary | ICD-10-CM | POA: Diagnosis not present

## 2019-02-17 NOTE — Progress Notes (Signed)
Patient ID: Todd Spears, male   DOB: 05/28/1946, 73 y.o.   MRN: [id number] Complaint:  Visit Type: Patient returns to my office for continued preventative foot care services. Complaint: Patient states" my nails have grown long and thick and become painful to walk and wear shoes". The patient presents for preventative foot care services. No changes to ROS.  Painful callus under the outside ball of both feet.  Podiatric Exam: Vascular: dorsalis pedis and posterior tibial pulses are palpable bilateral. Capillary return is immediate. Temperature gradient is WNL. Skin turgor WNL  Sensorium: Normal Semmes Weinstein monofilament test. Normal tactile sensation bilaterally. Nail Exam: Pt has thick disfigured discolored nails with subungual debris noted bilateral entire nail hallux through fifth toenails Ulcer Exam: There is no evidence of ulcer or pre-ulcerative changes or infection. Orthopedic Exam: Muscle tone and strength are WNL. No limitations in general ROM. No crepitus or effusions noted. HAV left with overlapping second.  HAV right foot. Skin:  Porokeratosis sub 5th metatarsal head both feet.. No infection or ulcers.  Porokeratosis sub 4 right. Diagnosis:  Onychomycosis, , Pain in right toe, pain in left toes. Porokeratosis B/L  Treatment & Plan Procedures and Treatment: Consent by patient was obtained for treatment procedures. The patient understood the discussion of treatment and procedures well. All questions were answered thoroughly reviewed. Debridement of mycotic and hypertrophic toenails, 1 through 5 bilateral and clearing of subungual debris. No ulceration, no infection noted.  Return Visit-Office Procedure: Patient instructed to return to the office for a follow up visit 3 months for continued evaluation and treatment.  Kezia Benevides DPM 

## 2019-02-20 ENCOUNTER — Other Ambulatory Visit: Payer: Self-pay | Admitting: Cardiology

## 2019-02-25 ENCOUNTER — Ambulatory Visit: Payer: BLUE CROSS/BLUE SHIELD

## 2019-03-15 ENCOUNTER — Ambulatory Visit (HOSPITAL_COMMUNITY)
Admission: RE | Admit: 2019-03-15 | Discharge: 2019-03-15 | Disposition: A | Discharge status: Home / Self Care | Payer: BC Managed Care – PPO | Source: Ambulatory Visit | Attending: Cardiology | Admitting: Cardiology

## 2019-03-15 ENCOUNTER — Other Ambulatory Visit: Payer: Self-pay

## 2019-03-15 ENCOUNTER — Other Ambulatory Visit (HOSPITAL_COMMUNITY): Payer: Self-pay | Admitting: Cardiovascular Disease

## 2019-03-15 DIAGNOSIS — I6523 Occlusion and stenosis of bilateral carotid arteries: Secondary | ICD-10-CM | POA: Diagnosis not present

## 2019-03-16 ENCOUNTER — Other Ambulatory Visit: Payer: Self-pay

## 2019-03-16 MED ORDER — EZETIMIBE 10 MG PO TABS: 10.0000 mg | ORAL_TABLET | Freq: Every day | ORAL | 3 refills | Status: DC

## 2019-03-19 ENCOUNTER — Other Ambulatory Visit: Payer: Self-pay | Admitting: Family Medicine

## 2019-03-19 MED ORDER — AMLODIPINE BESYLATE 10 MG PO TABS: 10.0000 mg | ORAL_TABLET | Freq: Every day | ORAL | 0 refills | Status: DC

## 2019-03-19 NOTE — Telephone Encounter (Signed)
Copied from [place] [phone]. Topic: Quick Communication - Rx Refill/Question >> Mar 19, 2019  9:17 AM Leward Quan A wrote: Medication: amLODipine (NORVASC) 10 MG tablet  Has the patient contacted their pharmacy? Yes.   (Agent: If no, request that the patient contact the pharmacy for the refill.) (Agent: If yes, when and what did the pharmacy advise?)   Preferred Pharmacy (with phone number or street name): [id number] [place], [place] - [address] [phone] (Phone) [phone] (Fax)    Agent: Please be advised that RX refills may take up to 3 business days. We ask that you follow-up with your pharmacy.

## 2019-03-21 ENCOUNTER — Other Ambulatory Visit: Payer: Self-pay | Admitting: Adult Health

## 2019-03-21 DIAGNOSIS — J302 Other seasonal allergic rhinitis: Secondary | ICD-10-CM

## 2019-03-23 ENCOUNTER — Other Ambulatory Visit: Payer: Self-pay

## 2019-03-23 MED ORDER — AMLODIPINE BESYLATE 10 MG PO TABS: 10.0000 mg | ORAL_TABLET | Freq: Every day | ORAL | 2 refills | Status: DC

## 2019-04-14 ENCOUNTER — Ambulatory Visit (INDEPENDENT_AMBULATORY_CARE_PROVIDER_SITE_OTHER): Payer: BC Managed Care – PPO | Admitting: Family Medicine

## 2019-04-14 ENCOUNTER — Encounter: Payer: Self-pay | Admitting: Family Medicine

## 2019-04-14 ENCOUNTER — Other Ambulatory Visit: Payer: Self-pay

## 2019-04-14 VITALS — BP 144/62 | HR 60 | Temp 97.9°F | Ht 67.0 in | Wt 231.0 lb

## 2019-04-14 DIAGNOSIS — I10 Essential (primary) hypertension: Secondary | ICD-10-CM | POA: Diagnosis not present

## 2019-04-14 DIAGNOSIS — E7800 Pure hypercholesterolemia, unspecified: Secondary | ICD-10-CM | POA: Diagnosis not present

## 2019-04-14 DIAGNOSIS — Z125 Encounter for screening for malignant neoplasm of prostate: Secondary | ICD-10-CM

## 2019-04-14 DIAGNOSIS — Z6836 Body mass index (BMI) 36.0-36.9, adult: Secondary | ICD-10-CM

## 2019-04-14 DIAGNOSIS — R739 Hyperglycemia, unspecified: Secondary | ICD-10-CM

## 2019-04-14 DIAGNOSIS — E669 Obesity, unspecified: Secondary | ICD-10-CM

## 2019-04-14 DIAGNOSIS — I2510 Atherosclerotic heart disease of native coronary artery without angina pectoris: Secondary | ICD-10-CM

## 2019-04-14 DIAGNOSIS — M1990 Unspecified osteoarthritis, unspecified site: Secondary | ICD-10-CM

## 2019-04-14 DIAGNOSIS — K219 Gastro-esophageal reflux disease without esophagitis: Secondary | ICD-10-CM

## 2019-04-14 DIAGNOSIS — Z0001 Encounter for general adult medical examination with abnormal findings: Secondary | ICD-10-CM

## 2019-04-14 DIAGNOSIS — N182 Chronic kidney disease, stage 2 (mild): Secondary | ICD-10-CM

## 2019-04-14 DIAGNOSIS — M109 Gout, unspecified: Secondary | ICD-10-CM

## 2019-04-14 LAB — URIC ACID: Uric Acid, Serum: 7.6 mg/dL (ref 4.0–7.8)

## 2019-04-14 LAB — LIPID PANEL
Cholesterol: 152 mg/dL (ref 0–200)
HDL: 47 mg/dL (ref >39.00)
LDL Cholesterol: 78 mg/dL (ref 0–99)
NonHDL: 104.86
Total CHOL/HDL Ratio: 3
Triglycerides: 133 mg/dL (ref 0.0–149.0)
VLDL: 26.6 mg/dL (ref 0.0–40.0)

## 2019-04-14 LAB — CBC
HCT: 35.5 % — ABNORMAL LOW (ref 39.0–52.0)
Hemoglobin: 12.1 g/dL — ABNORMAL LOW (ref 13.0–17.0)
MCHC: 34 g/dL (ref 30.0–36.0)
MCV: 91.8 fl (ref 78.0–100.0)
Platelets: 225 10*3/uL (ref 150.0–400.0)
RBC: 3.87 Mil/uL — ABNORMAL LOW (ref 4.22–5.81)
RDW: 12.7 % (ref 11.5–15.5)
WBC: 6.1 10*3/uL (ref 4.0–10.5)

## 2019-04-14 LAB — COMPREHENSIVE METABOLIC PANEL
ALT: 28 U/L (ref 0–53)
AST: 24 U/L (ref 0–37)
Albumin: 4.1 g/dL (ref 3.5–5.2)
Alkaline Phosphatase: 47 U/L (ref 39–117)
BUN: 18 mg/dL (ref 6–23)
CO2: 26 mEq/L (ref 19–32)
Calcium: 9.1 mg/dL (ref 8.4–10.5)
Chloride: 101 mEq/L (ref 96–112)
Creatinine, Ser: 1.14 mg/dL (ref 0.40–1.50)
GFR: 62.82 mL/min (ref >60.00)
Glucose, Bld: 114 mg/dL — ABNORMAL HIGH (ref 70–99)
Potassium: 4.4 mEq/L (ref 3.5–5.1)
Sodium: 134 mEq/L — ABNORMAL LOW (ref 135–145)
Total Bilirubin: 0.6 mg/dL (ref 0.2–1.2)
Total Protein: 6.7 g/dL (ref 6.0–8.3)

## 2019-04-14 LAB — TSH: TSH: 2 u[IU]/mL (ref 0.35–4.50)

## 2019-04-14 LAB — HEMOGLOBIN A1C: Hgb A1c MFr Bld: 5.8 % (ref 4.6–6.5)

## 2019-04-14 LAB — PSA: PSA: 0.6 ng/mL (ref 0.10–4.00)

## 2019-04-14 MED ORDER — TRAMADOL HCL 50 MG PO TABS: 50.0000 mg | ORAL_TABLET | Freq: Two times a day (BID) | ORAL | 0 refills | Status: DC

## 2019-04-14 NOTE — Assessment & Plan Note (Signed)
Check uric acid level 

## 2019-04-14 NOTE — Assessment & Plan Note (Signed)
Slightly above goal.  Home readings are at goal.  Continue Norvasc 10 mg daily, lisinopril 20 mg twice daily, metoprolol succinate 50 mg daily, and hydralazine 100 mg 3 times daily.  Continue home blood pressure monitoring with goal 140/90 or lower.

## 2019-04-14 NOTE — Assessment & Plan Note (Signed)
Continue management per nephrology.  Check C met.

## 2019-04-14 NOTE — Assessment & Plan Note (Signed)
Stable.  Continue tramadol and Tylenol as needed. 

## 2019-04-14 NOTE — Assessment & Plan Note (Signed)
Continue pravastatin and Zetia.  We will check lipid panel today.

## 2019-04-14 NOTE — Assessment & Plan Note (Signed)
Stable. Continue Nexium 40mg daily

## 2019-04-14 NOTE — Assessment & Plan Note (Signed)
Continue aspirin and statin. 

## 2019-04-14 NOTE — Patient Instructions (Signed)
It was very nice to see you today!  Keep up the good work!  I will refill your tramadol.  We will check blood work today.  Come back in 6 months, or sooner if needed.  Take care, Dr Jerline Pain  Please try these tips to maintain a healthy lifestyle:   Eat at least 3 REAL meals and 1-2 snacks per day.  Aim for no more than 5 hours between eating.  If you eat breakfast, please do so within one hour of getting up.    Obtain twice as many fruits/vegetables as protein or carbohydrate foods for both lunch and dinner. (Half of each meal should be fruits/vegetables, one quarter protein, and one quarter starchy carbs)   Cut down on sweet beverages. This includes juice, soda, and sweet tea.    Exercise at least 150 minutes every week.    Preventive Care 73 Years and Older, Male Preventive care refers to lifestyle choices and visits with your health care provider that can promote health and wellness. This includes:  A yearly physical exam. This is also called an annual well check.  Regular dental and eye exams.  Immunizations.  Screening for certain conditions.  Healthy lifestyle choices, such as diet and exercise. What can I expect for my preventive care visit? Physical exam Your health care provider will check:  Height and weight. These may be used to calculate body mass index (BMI), which is a measurement that tells if you are at a healthy weight.  Heart rate and blood pressure.  Your skin for abnormal spots. Counseling Your health care provider may ask you questions about:  Alcohol, tobacco, and drug use.  Emotional well-being.  Home and relationship well-being.  Sexual activity.  Eating habits.  History of falls.  Memory and ability to understand (cognition).  Work and work Statistician. What immunizations do I need?  Influenza (flu) vaccine  This is recommended every year. Tetanus, diphtheria, and pertussis (Tdap) vaccine  You may need a Td booster every  10 years. Varicella (chickenpox) vaccine  You may need this vaccine if you have not already been vaccinated. Zoster (shingles) vaccine  You may need this after age 40. Pneumococcal conjugate (PCV13) vaccine  One dose is recommended after age 41. Pneumococcal polysaccharide (PPSV23) vaccine  One dose is recommended after age 24. Measles, mumps, and rubella (MMR) vaccine  You may need at least one dose of MMR if you were born in 1957 or later. You may also need a second dose. Meningococcal conjugate (MenACWY) vaccine  You may need this if you have certain conditions. Hepatitis A vaccine  You may need this if you have certain conditions or if you travel or work in places where you may be exposed to hepatitis A. Hepatitis B vaccine  You may need this if you have certain conditions or if you travel or work in places where you may be exposed to hepatitis B. Haemophilus influenzae type b (Hib) vaccine  You may need this if you have certain conditions. You may receive vaccines as individual doses or as more than one vaccine together in one shot (combination vaccines). Talk with your health care provider about the risks and benefits of combination vaccines. What tests do I need? Blood tests  Lipid and cholesterol levels. These may be checked every 5 years, or more frequently depending on your overall health.  Hepatitis C test.  Hepatitis B test. Screening  Lung cancer screening. You may have this screening every year starting at age 31  if you have a 30-pack-year history of smoking and currently smoke or have quit within the past 15 years.  Colorectal cancer screening. All adults should have this screening starting at age 15 and continuing until age 71. Your health care provider may recommend screening at age 72 if you are at increased risk. You will have tests every 1-10 years, depending on your results and the type of screening test.  Prostate cancer screening. Recommendations will  vary depending on your family history and other risks.  Diabetes screening. This is done by checking your blood sugar (glucose) after you have not eaten for a while (fasting). You may have this done every 1-3 years.  Abdominal aortic aneurysm (AAA) screening. You may need this if you are a current or former smoker.  Sexually transmitted disease (STD) testing. Follow these instructions at home: Eating and drinking  Eat a diet that includes fresh fruits and vegetables, whole grains, lean protein, and low-fat dairy products. Limit your intake of foods with high amounts of sugar, saturated fats, and salt.  Take vitamin and mineral supplements as recommended by your health care provider.  Do not drink alcohol if your health care provider tells you not to drink.  If you drink alcohol: ? Limit how much you have to 0-2 drinks a day. ? Be aware of how much alcohol is in your drink. In the [place] one drink equals one 12 oz bottle of beer (355 mL), one 5 oz glass of wine (148 mL), or one 1 oz glass of hard liquor (44 mL). Lifestyle  Take daily care of your teeth and gums.  Stay active. Exercise for at least 30 minutes on 5 or more days each week.  Do not use any products that contain nicotine or tobacco, such as cigarettes, e-cigarettes, and chewing tobacco. If you need help quitting, ask your health care provider.  If you are sexually active, practice safe sex. Use a condom or other form of protection to prevent STIs (sexually transmitted infections).  Talk with your health care provider about taking a low-dose aspirin or statin. What's next?  Visit your health care provider once a year for a well check visit.  Ask your health care provider how often you should have your eyes and teeth checked.  Stay up to date on all vaccines. This information is not intended to replace advice given to you by your health care provider. Make sure you discuss any questions you have with your health care  provider. Document Released: 06/16/2015 Document Revised: 05/14/2018 Document Reviewed: 05/14/2018 Elsevier Patient Education  2020 [employer].

## 2019-04-14 NOTE — Progress Notes (Signed)
Chief Complaint:  Todd Spears is a 73 y.o. male who presents today for his annual comprehensive physical exam.    Assessment/Plan:  No problem-specific Assessment & Plan notes found for this encounter.   Body mass index is 36.18 kg/m. / Obese    Preventative Healthcare: Check CBC, C met, TSH, lipid panel, and PSA.  Up-to-date on flu vaccine and colon cancer screening.  Patient Counseling(The following topics were reviewed and/or handout was given):  -Nutrition: Stressed importance of moderation in sodium/caffeine intake, saturated fat and cholesterol, caloric balance, sufficient intake of fresh fruits, vegetables, and fiber.  -Stressed the importance of regular exercise.   -Substance Abuse: Discussed cessation/primary prevention of tobacco, alcohol, or other drug use; driving or other dangerous activities under the influence; availability of treatment for abuse.   -Injury prevention: Discussed safety belts, safety helmets, smoke detector, smoking near bedding or upholstery.   -Sexuality: Discussed sexually transmitted diseases, partner selection, use of condoms, avoidance of unintended pregnancy and contraceptive alternatives.   -Dental health: Discussed importance of regular tooth brushing, flossing, and dental visits.  -Health maintenance and immunizations reviewed. Please refer to Health maintenance section.  Return to care in 1 year for next preventative visit.     Subjective:  HPI:  He has no acute complaints today.   His stable, chronic medical conditions are outlined below:   # Hypertension - On norvasc 59m daily, lisinopril 230mtwice daily, metoprolol 5017maily, hydralazine 100m85mree time daily, and tolerating well - Home BPs: 120s/80s - ROS: No reported chest pain or shortness of breath  # CAD / Carotid Artery Disease s/p PCI in 2013 / HLD - follows with cardiology - On aspirin 81mg48mly,  Pravastatin daily, and zetia 10mg 29my  # GERD - Takes  nexium 40mg d60m  # Allergic Rhinitis - On astelin nasal spray, claritin 10mg da63m and singulair 10mg dai75m# Gout - Follows with Rheumatology - Dr Beekman -Amil Amenuloric 80mg dail73m Osteoarthritis - Takes tylenol and tramadol as needed.    # CKD - Follows with nephrology - Dr ColadanatoAmbrose Pancoastle Diet: Balanced.  Tries eat a healthy diet. Exercise: Very active at work.  No other specific exercises.  Depression screen PHQ 2/9 04/14/2019  Decreased Interest 0  Down, Depressed, Hopeless 0  PHQ - 2 Score 0  Some recent data might be hidden    There are no preventive care reminders to display for this patient.   ROS: Per HPI, otherwise a complete review of systems was negative.   PMH:  The following were reviewed and entered/updated in epic: Past Medical History:  Diagnosis Date  . Allergic rhinitis   . Anemia   . Asthma   . CAD (coronary artery disease)    a. s/p PTCA/stenting of the mid LAD and mid RCA 04/06/12  . Carotid bruit    r  . Colon polyp   . Diverticulosis   . ED (erectile dysfunction)   . Fatty liver   . GERD (gastroesophageal reflux disease)   . Gout   . Hyperlipidemia   . Hypertension   . Obesity   . Osteoarthritis   . Renal insufficiency   . Restless leg syndrome    Patient Active Problem List   Diagnosis Date Noted  . Pain due to onychomycosis of toenails of both feet 11/18/2018  . Porokeratosis 11/18/2018  . Gout 06/18/2018  . Osteoarthritis 06/18/2018  . Carotid arterial disease (HCC) 03/09[place]17  . Aortic  stenosis, mild 08/10/2015  . CKD (chronic kidney disease) stage 2, GFR 60-89 ml/min 07/03/2015  . CAD s/p PCI 2013   . Hyperlipidemia 11/27/2006  . Essential hypertension 11/27/2006  . Allergic rhinitis 11/27/2006  . GERD 11/27/2006   Past Surgical History:  Procedure Laterality Date  . COLONOSCOPY    . INGUINAL HERNIA REPAIR Right 02/27/2016   Procedure: HERNIA REPAIR INGUINAL ADULT;  Surgeon: Rolm Bookbinder, MD;   Location: [facility];  Service: General;  Laterality: Right;  . INSERTION OF MESH Right 02/27/2016   Procedure: INSERTION OF MESH--IN AND OUT CATHETERIZATION PERFORMED AT END OF PROCEDURE TO DRAIN 400ML URINE.;  Surgeon: Rolm Bookbinder, MD;  Location: [facility];  Service: General;  Laterality: Right;  . LYMPH NODE BIOPSY     right side of neck 1999 - benign  . PERCUTANEOUS CORONARY STENT INTERVENTION (PCI-S) N/A 04/06/2012   Procedure: PERCUTANEOUS CORONARY STENT INTERVENTION (PCI-S);  Surgeon: Peter M Martinique, MD; Promus DES for 90% mid LAD and 95% mid RCA disease      Family History  Problem Relation Age of Onset  . Coronary artery disease Father        in his 32s  . Heart disease Father   . Heart attack Father   . Hypertension Mother   . Stroke Mother        Several   . Stomach cancer Mother   . Glaucoma Mother   . Hypertension Paternal Grandmother   . Hypertension Paternal Grandfather   . Colon cancer Neg Hx   . Esophageal cancer Neg Hx   . Rectal cancer Neg Hx     Medications- reviewed and updated Current Outpatient Medications  Medication Sig Dispense Refill  . albuterol (PROAIR HFA) 108 (90 Base) MCG/ACT inhaler INHALE 2 PUFFS INTO THE LUNGS EVERY 6 (SIX) HOURS AS NEEDED. FOR SHORTNESS OF BREATH 18 g 3  . albuterol (VENTOLIN HFA) 108 (90 Base) MCG/ACT inhaler INHALE 2 PUFFS INTO THE LUNGS EVERY 6 (SIX) HOURS AS NEEDED. FOR SHORTNESS OF BREATH 6.7 g 2  . amLODipine (NORVASC) 10 MG tablet Take 1 tablet (10 mg total) by mouth daily. 90 tablet 2  . aspirin 81 MG chewable tablet Chew 81 mg by mouth daily.    Marland Kitchen azelastine (ASTELIN) 0.1 % nasal spray Place 2 sprays into both nostrils 2 (two) times daily. Use in each nostril as directed 30 mL 12  . cetirizine (ZYRTEC) 10 MG tablet Take 10 mg by mouth every morning.    . Cyanocobalamin (VITAMIN B 12 PO) Take 1 tablet by mouth daily.     . diclofenac (VOLTAREN) 50 MG EC tablet Take 1 tablet (50 mg  total) by mouth 2 (two) times daily. 30 tablet 0  . diclofenac sodium (VOLTAREN) 1 % GEL Apply 2 g topically 4 (four) times daily. 100 g 3  . esomeprazole (NEXIUM) 40 MG capsule TAKE 1 CAPSULE DAILY BEFOREBREAKFAST 90 capsule 3  . ezetimibe (ZETIA) 10 MG tablet Take 1 tablet (10 mg total) by mouth daily. 90 tablet 3  . FeFum-FePoly-FA-B Cmp-C-Biot (INTEGRA PLUS) CAPS Take 1 tablet by mouth daily.    . halobetasol (ULTRAVATE) 0.05 % cream Apply topically 2 (two) times daily. 50 g 3  . hydrALAZINE (APRESOLINE) 50 MG tablet TAKE 1.5 TABLETS (75 MG TOTAL) BY MOUTH 3 (THREE) TIMES DAILY. 405 tablet 1  . lisinopril (PRINIVIL,ZESTRIL) 40 MG tablet Take 1 tablet (40 mg total) by mouth daily. 90 tablet 3  . loratadine (CLARITIN)  10 MG tablet Take 10 mg by mouth at bedtime.     Marland Kitchen LUMIGAN 0.01 % SOLN     . metoprolol succinate (TOPROL-XL) 50 MG 24 hr tablet TAKE 1 TABLET DAILY WITH ORIMMEDIATELY FOLLOWING A    MEAL 90 tablet 3  . mometasone (NASONEX) 50 MCG/ACT nasal spray Place 1-2 sprays into the nose as needed.    . montelukast (SINGULAIR) 10 MG tablet TAKE 1 TABLET BY MOUTH EVERYDAY AT BEDTIME 90 tablet 3  . Multiple Vitamin (MULTIVITAMIN WITH MINERALS) TABS tablet Take 1 tablet by mouth daily.    . nitroGLYCERIN (NITROSTAT) 0.4 MG SL tablet Place 1 tablet (0.4 mg total) under the tongue every 5 (five) minutes as needed for chest pain (up to 3 doses. not within 24hrs of Viagra). 25 tablet 6  . pravastatin (PRAVACHOL) 10 MG tablet TAKE 1 TABLET EVERY OTHER DAY, ALTERNATING WITH 2 TABLETS. 135 tablet 3  . Saline (SIMPLY SALINE) 0.9 % AERS Place 1 spray into the nose daily as needed (congestion).     Marland Kitchen tiZANidine (ZANAFLEX) 4 MG tablet Take 1 tablet (4 mg total) by mouth every 6 (six) hours as needed for muscle spasms. 30 tablet 0  . traMADol (ULTRAM) 50 MG tablet TAKE 1 TABLET (50 MG TOTAL) BY MOUTH EVERY 12 (TWELVE) HOURS. 60 tablet 0   No current facility-administered medications for this visit.      Allergies-reviewed and updated Allergies  Allergen Reactions  . Prednisone Shortness Of Breath and Swelling  . Crestor [Rosuvastatin Calcium] Other (See Comments)    myalgia  . Lyrica [Pregabalin]     "swelling"    Social History   Socioeconomic History  . Marital status: Married    Spouse name: Not on file  . Number of children: 1  . Years of education: Not on file  . Highest education level: Not on file  Occupational History    Employer: [employer]  Social Needs  . Financial resource strain: Not on file  . Food insecurity    Worry: Not on file    Inability: Not on file  . Transportation needs    Medical: Not on file    Non-medical: Not on file  Tobacco Use  . Smoking status: Former Smoker    Quit date: 06/04/1975    Years since quitting: 43.8  . Smokeless tobacco: Never Used  Substance and Sexual Activity  . Alcohol use: No    Alcohol/week: 0.0 standard drinks  . Drug use: No  . Sexual activity: Not Currently  Lifestyle  . Physical activity    Days per week: Not on file    Minutes per session: Not on file  . Stress: Not on file  Relationships  . Social Herbalist on phone: Not on file    Gets together: Not on file    Attends religious service: Not on file    Active member of club or organization: Not on file    Attends meetings of clubs or organizations: Not on file    Relationship status: Not on file  Other Topics Concern  . Not on file  Social History Narrative   Works as a Engineer, building services    Married   One son ( lives in [place])    Regular exercise- no        Objective:  Physical Exam: BP (!) 144/62   Pulse 60   Temp 97.9 F (36.6 C)   Ht '5\' 7"'  (1.702 m)  Wt 231 lb (104.8 kg)   SpO2 97%   BMI 36.18 kg/m   Body mass index is 36.18 kg/m. Wt Readings from Last 3 Encounters:  04/14/19 231 lb (104.8 kg)  10/16/18 234 lb 3.2 oz (106.2 kg)  10/16/18 232 lb (105.2 kg)   Gen: NAD, resting comfortably HEENT: TMs normal  bilaterally. OP clear. No thyromegaly noted.  CV: RRR with 2/6 systolic murmur appreciated Pulm: NWOB, CTAB with no crackles, wheezes, or rhonchi GI: Normal bowel sounds present. Soft, Nontender, Nondistended. MSK: no edema, cyanosis, or clubbing noted Skin: warm, dry Neuro: CN2-12 grossly intact. Strength 5/5 in upper and lower extremities. Reflexes symmetric and intact bilaterally.  Psych: Normal affect and thought content     Caleb M. Jerline Pain, MD 04/14/2019 8:17 AM

## 2019-04-15 NOTE — Progress Notes (Signed)
Please inform patient of the following:  His blood sugar is up slightly since last year, but everything else is NORMAL. Do not need to make any changes to his treatment plan at this time. Recommend that he keep up the good work with diet and exercise and we can recheck in a year or so.  Todd Spears. Jerline Pain, MD 04/15/2019 8:03 AM

## 2019-04-19 DIAGNOSIS — H401121 Primary open-angle glaucoma, left eye, mild stage: Secondary | ICD-10-CM | POA: Diagnosis not present

## 2019-05-02 ENCOUNTER — Other Ambulatory Visit: Payer: Self-pay | Admitting: Adult Health

## 2019-05-03 ENCOUNTER — Other Ambulatory Visit: Payer: Self-pay

## 2019-05-04 ENCOUNTER — Other Ambulatory Visit: Payer: Self-pay | Admitting: Adult Health

## 2019-05-04 ENCOUNTER — Ambulatory Visit: Payer: BC Managed Care – PPO | Admitting: Family Medicine

## 2019-05-20 ENCOUNTER — Other Ambulatory Visit: Payer: Self-pay

## 2019-05-20 MED ORDER — PRAVASTATIN SODIUM 10 MG PO TABS: ORAL_TABLET | ORAL | 1 refills | Status: DC

## 2019-05-25 ENCOUNTER — Other Ambulatory Visit: Payer: Self-pay

## 2019-05-25 ENCOUNTER — Encounter: Payer: Self-pay | Admitting: Podiatry

## 2019-05-25 ENCOUNTER — Ambulatory Visit (INDEPENDENT_AMBULATORY_CARE_PROVIDER_SITE_OTHER): Payer: BC Managed Care – PPO | Admitting: Podiatry

## 2019-05-25 DIAGNOSIS — Q828 Other specified congenital malformations of skin: Secondary | ICD-10-CM

## 2019-05-25 DIAGNOSIS — B351 Tinea unguium: Secondary | ICD-10-CM | POA: Diagnosis not present

## 2019-05-25 DIAGNOSIS — M216X9 Other acquired deformities of unspecified foot: Secondary | ICD-10-CM

## 2019-05-25 DIAGNOSIS — M79675 Pain in left toe(s): Secondary | ICD-10-CM

## 2019-05-25 DIAGNOSIS — M79674 Pain in right toe(s): Secondary | ICD-10-CM

## 2019-05-25 NOTE — Progress Notes (Signed)
Patient ID: Todd Spears, male   DOB: Feb 07, 1946, 73 y.o.   MRN: [id number] Complaint:  Visit Type: Patient returns to my office for continued preventative foot care services. Complaint: Patient states" my nails have grown long and thick and become painful to walk and wear shoes". The patient presents for preventative foot care services. No changes to ROS.  Painful callus under the outside ball of both feet.  Podiatric Exam: Vascular: dorsalis pedis and posterior tibial pulses are palpable bilateral. Capillary return is immediate. Temperature gradient is WNL. Skin turgor WNL  Sensorium: Normal Semmes Weinstein monofilament test. Normal tactile sensation bilaterally. Nail Exam: Pt has thick disfigured discolored nails with subungual debris noted bilateral entire nail hallux through fifth toenails Ulcer Exam: There is no evidence of ulcer or pre-ulcerative changes or infection. Orthopedic Exam: Muscle tone and strength are WNL. No limitations in general ROM. No crepitus or effusions noted. HAV left with overlapping second.  HAV right foot. Skin:  Porokeratosis sub 5th metatarsal head both feet.. No infection or ulcers.   Diagnosis:  Onychomycosis, , Pain in right toe, pain in left toes. Porokeratosis B/L  Treatment & Plan Procedures and Treatment: Consent by patient was obtained for treatment procedures. The patient understood the discussion of treatment and procedures well. All questions were answered thoroughly reviewed. Debridement of mycotic and hypertrophic toenails, 1 through 5 bilateral and clearing of subungual debris. No ulceration, no infection noted.  Return Visit-Office Procedure: Patient instructed to return to the office for a follow up visit 3 months for continued evaluation and treatment.  Gardiner Barefoot DPM

## 2019-06-11 ENCOUNTER — Other Ambulatory Visit: Payer: Self-pay | Admitting: Family Medicine

## 2019-06-11 NOTE — Telephone Encounter (Signed)
Last OV 04/14/19 Last refill 03/23/19 #90/2 Next OV 10/12/19

## 2019-06-23 ENCOUNTER — Telehealth: Payer: Self-pay | Admitting: Cardiology

## 2019-06-23 NOTE — Telephone Encounter (Signed)
We are recommending the COVID-19 vaccine to all of our patients. Cardiac medications (including blood thinners) should not deter anyone from being vaccinated and there is no need to hold any of those medications prior to vaccine administration.     Currently, there is a hotline to call (active 06/11/19) to schedule vaccination appointments as no walk-ins will be accepted.   Number: [phone].    If an appointment is not available please go to [email or web address] to sign up for notification when additional vaccine appointments are available.   If you have further questions or concerns about the vaccine process, please visit [email or web address] or contact your primary care physician.   

## 2019-07-11 ENCOUNTER — Other Ambulatory Visit: Payer: Self-pay | Admitting: Cardiology

## 2019-07-12 NOTE — Telephone Encounter (Signed)
Rx has been sent to the pharmacy electronically. ° °

## 2019-07-28 ENCOUNTER — Encounter: Payer: Self-pay | Admitting: Family Medicine

## 2019-08-03 ENCOUNTER — Telehealth: Payer: Self-pay | Admitting: Family Medicine

## 2019-08-03 ENCOUNTER — Other Ambulatory Visit: Payer: Self-pay

## 2019-08-03 MED ORDER — INTEGRA PLUS PO CAPS: ORAL_CAPSULE | ORAL | 0 refills | Status: DC

## 2019-08-03 NOTE — Telephone Encounter (Signed)
Left voice message for patient to call clinic.  

## 2019-08-03 NOTE — Telephone Encounter (Signed)
Patient will call back with dosage information

## 2019-08-03 NOTE — Telephone Encounter (Signed)
Pt called requesting refill on Integra for iron. Pt states one of his other providers prescribed the medication but is requesting Dr. Jerline Pain refill since he sees him more often. Pt wants [facility] in [place], [place]. Pt states pharmacy sent over request. Please advise.

## 2019-08-03 NOTE — Telephone Encounter (Signed)
Patient returning missed call. 

## 2019-08-24 ENCOUNTER — Ambulatory Visit: Payer: 59 | Admitting: Podiatry

## 2019-08-24 ENCOUNTER — Encounter: Payer: Self-pay | Admitting: Podiatry

## 2019-08-24 ENCOUNTER — Other Ambulatory Visit: Payer: Self-pay

## 2019-08-24 VITALS — Temp 97.3°F

## 2019-08-24 DIAGNOSIS — M216X9 Other acquired deformities of unspecified foot: Secondary | ICD-10-CM | POA: Diagnosis not present

## 2019-08-24 DIAGNOSIS — Q828 Other specified congenital malformations of skin: Secondary | ICD-10-CM | POA: Diagnosis not present

## 2019-08-24 DIAGNOSIS — B351 Tinea unguium: Secondary | ICD-10-CM | POA: Diagnosis not present

## 2019-08-24 DIAGNOSIS — M79674 Pain in right toe(s): Secondary | ICD-10-CM

## 2019-08-24 DIAGNOSIS — M79675 Pain in left toe(s): Secondary | ICD-10-CM

## 2019-08-24 DIAGNOSIS — N182 Chronic kidney disease, stage 2 (mild): Secondary | ICD-10-CM | POA: Diagnosis not present

## 2019-08-24 NOTE — Progress Notes (Signed)
This patient returns to my office for at risk foot care.  This patient requires this care by a professional since this patient will be at risk due to having chronic kidney disease.  Patient has painful callus on the outside bottom of both feet.  This patient is unable to cut nails himself since the patient cannot reach his nails.These nails are painful walking and wearing shoes. Patient has painful callus on the outside ball both feet.  Patient is unable to self treat.   This patient presents for at risk foot care today.  General Appearance  Alert, conversant and in no acute stress.  Vascular  Dorsalis pedis and posterior tibial  pulses are palpable  bilaterally.  Capillary return is within normal limits  bilaterally. Temperature is within normal limits  bilaterally.  Neurologic  Senn-Weinstein monofilament wire test within normal limits  bilaterally. Muscle power within normal limits bilaterally.  Nails Thick disfigured discolored nails with subungual debris  from hallux to fifth toes bilaterally. No evidence of bacterial infection or drainage bilaterally.  Orthopedic  No limitations of motion  feet .  No crepitus or effusions noted.  No bony pathology or digital deformities noted.  Skin  normotropic skin with no porokeratosis noted bilaterally.  No signs of infections or ulcers noted.  Porokeratosis sub 5th metatarsal  B/L.   Onychomycosis  Pain in right toes  Pain in left toes  Porokeratosis B/L.  Consent was obtained for treatment procedures.   Mechanical debridement of nails 1-5  bilaterally performed with a nail nipper.  Filed with dremel without incident. No infection or ulcer.  Debridement of porokeratosis  Sub 5th met  B/L with # 15 blade.   Return office visit     3 months                 Told patient to return for periodic foot care and evaluation due to potential at risk complications.   Erland Vivas DPM  

## 2019-08-26 ENCOUNTER — Other Ambulatory Visit: Payer: Self-pay | Admitting: Family Medicine

## 2019-09-05 ENCOUNTER — Other Ambulatory Visit: Payer: Self-pay | Admitting: Cardiology

## 2019-09-05 ENCOUNTER — Other Ambulatory Visit: Payer: Self-pay | Admitting: Adult Health

## 2019-09-05 DIAGNOSIS — J302 Other seasonal allergic rhinitis: Secondary | ICD-10-CM

## 2019-09-06 ENCOUNTER — Telehealth: Payer: Self-pay | Admitting: Family Medicine

## 2019-09-06 MED ORDER — INTEGRA PLUS PO CAPS: ORAL_CAPSULE | ORAL | 0 refills | Status: DC

## 2019-09-06 NOTE — Telephone Encounter (Signed)
[facility] 

## 2019-09-06 NOTE — Telephone Encounter (Signed)
Patient called stating that pharmacy did not receive script for Integra.  He is requesting a 90 day [facility] [place].

## 2019-09-07 ENCOUNTER — Telehealth: Payer: Self-pay | Admitting: Family Medicine

## 2019-09-07 NOTE — Telephone Encounter (Signed)
Pt has a new primary care provider.

## 2019-09-07 NOTE — Telephone Encounter (Signed)
  LAST APPOINTMENT DATE: 04/14/2019 NEXT APPOINTMENT DATE:@5 /04/2020  MEDICATION:pravastatin (PRAVACHOL) 10 MG tablet  PHARMACY:[id number] - [place], [place] - [address]   **Let patient know to contact pharmacy at the end of the day to make sure medication is ready. **  ** Please notify patient to allow 48-72 hours to process**  **Encourage patient to contact the pharmacy for refills or they can request refills through [facility]**  CLINICAL FILLS OUT ALL BELOW:   LAST REFILL:  QTY:  REFILL DATE:    OTHER COMMENTS:    Okay for refill?  Please advise

## 2019-09-08 ENCOUNTER — Other Ambulatory Visit: Payer: Self-pay

## 2019-09-08 ENCOUNTER — Other Ambulatory Visit: Payer: Self-pay | Admitting: *Deleted

## 2019-09-08 MED ORDER — INTEGRA PLUS PO CAPS: ORAL_CAPSULE | ORAL | 0 refills | Status: DC

## 2019-09-08 MED ORDER — PRAVASTATIN SODIUM 10 MG PO TABS: ORAL_TABLET | ORAL | 1 refills | Status: DC

## 2019-09-08 NOTE — Telephone Encounter (Signed)
Rx sent 

## 2019-10-01 ENCOUNTER — Telehealth: Payer: Self-pay | Admitting: Podiatry

## 2019-10-01 NOTE — Telephone Encounter (Signed)
I received a bill from a visit(s) with Dr. Prudence Davidson from [facility] and I think its been coded wrong again. All he does is trim my toenails and the callous on my feet. I've never gotten a bill before. I pay my deductible and that's all I have to pay. Can you check on this and call me back at [phone] which is my cell. Thank you. Bye bye.

## 2019-10-11 ENCOUNTER — Other Ambulatory Visit: Payer: Self-pay

## 2019-10-12 ENCOUNTER — Ambulatory Visit (INDEPENDENT_AMBULATORY_CARE_PROVIDER_SITE_OTHER): Payer: 59 | Admitting: Family Medicine

## 2019-10-12 VITALS — BP 150/60 | HR 70 | Temp 98.1°F | Ht 67.0 in | Wt 236.4 lb

## 2019-10-12 DIAGNOSIS — I10 Essential (primary) hypertension: Secondary | ICD-10-CM

## 2019-10-12 DIAGNOSIS — M1990 Unspecified osteoarthritis, unspecified site: Secondary | ICD-10-CM | POA: Diagnosis not present

## 2019-10-12 NOTE — Progress Notes (Signed)
   Todd Spears is a 74 y.o. male who presents today for an office visit.  Assessment/Plan:  Chronic Problems Addressed Today: Essential hypertension At goal per JNC 8.  Continue Norvasc 10 mg daily, lisinopril 20 mg twice daily, metoprolol succinate 50 mg daily, and hydralazine 100 mg 3 times daily.  Osteoarthritis Stable.  Continue tramadol and Tylenol as needed.  Has not needed refill on tramadol since her last visit.     Subjective:  HPI:  See A/p.         Objective:  Physical Exam: BP (!) 150/60 (BP Location: Left Arm, Patient Position: Sitting, Cuff Size: Normal)   Pulse 70   Temp 98.1 F (36.7 C) (Temporal)   Ht 5\' 7"  (1.702 m)   Wt 236 lb 6.4 oz (107.2 kg)   SpO2 98%   BMI 37.03 kg/m   Gen: No acute distress, resting comfortably CV: Regular rate and rhythm with no murmurs appreciated Pulm: Normal work of breathing, clear to auscultation bilaterally with no crackles, wheezes, or rhonchi Neuro: Grossly normal, moves all extremities Psych: Normal affect and thought content      Todd Spears M. Jerline Pain, MD 10/12/2019 8:25 AM

## 2019-10-12 NOTE — Assessment & Plan Note (Signed)
At goal per JNC 8.  Continue Norvasc 10 mg daily, lisinopril 20 mg twice daily, metoprolol succinate 50 mg daily, and hydralazine 100 mg 3 times daily.

## 2019-10-12 NOTE — Patient Instructions (Signed)
It was very nice to see you today!  Keep up the great work!  No changes today.  Back in 6 months for your annual checkup blood work, or sooner if needed.  Take care, Dr Jerline Pain  Please try these tips to maintain a healthy lifestyle:   Eat at least 3 REAL meals and 1-2 snacks per day.  Aim for no more than 5 hours between eating.  If you eat breakfast, please do so within one hour of getting up.    Each meal should contain half fruits/vegetables, one quarter protein, and one quarter carbs (no bigger than a computer mouse)   Cut down on sweet beverages. This includes juice, soda, and sweet tea.     Drink at least 1 glass of water with each meal and aim for at least 8 glasses per day   Exercise at least 150 minutes every week.

## 2019-10-12 NOTE — Assessment & Plan Note (Signed)
Stable.  Continue tramadol and Tylenol as needed.  Has not needed refill on tramadol since her last visit.

## 2019-11-23 ENCOUNTER — Ambulatory Visit: Payer: No Typology Code available for payment source | Admitting: Podiatry

## 2019-11-23 ENCOUNTER — Encounter: Payer: Self-pay | Admitting: Podiatry

## 2019-11-23 ENCOUNTER — Other Ambulatory Visit: Payer: Self-pay

## 2019-11-23 ENCOUNTER — Other Ambulatory Visit: Payer: Self-pay | Admitting: Family Medicine

## 2019-11-23 DIAGNOSIS — M216X9 Other acquired deformities of unspecified foot: Secondary | ICD-10-CM | POA: Diagnosis not present

## 2019-11-23 DIAGNOSIS — M79675 Pain in left toe(s): Secondary | ICD-10-CM | POA: Diagnosis not present

## 2019-11-23 DIAGNOSIS — Q828 Other specified congenital malformations of skin: Secondary | ICD-10-CM | POA: Diagnosis not present

## 2019-11-23 DIAGNOSIS — B351 Tinea unguium: Secondary | ICD-10-CM | POA: Diagnosis not present

## 2019-11-23 DIAGNOSIS — N182 Chronic kidney disease, stage 2 (mild): Secondary | ICD-10-CM | POA: Diagnosis not present

## 2019-11-23 DIAGNOSIS — M79674 Pain in right toe(s): Secondary | ICD-10-CM | POA: Diagnosis not present

## 2019-11-23 NOTE — Progress Notes (Signed)
This patient returns to my office for at risk foot care.  This patient requires this care by a professional since this patient will be at risk due to having chronic kidney disease.  Patient has painful callus on the outside bottom of both feet.  This patient is unable to cut nails himself since the patient cannot reach his nails.These nails are painful walking and wearing shoes. Patient has painful callus on the outside ball both feet.  Patient is unable to self treat.   This patient presents for at risk foot care today.  General Appearance  Alert, conversant and in no acute stress.  Vascular  Dorsalis pedis and posterior tibial  pulses are palpable  bilaterally.  Capillary return is within normal limits  bilaterally. Temperature is within normal limits  bilaterally.  Neurologic  Senn-Weinstein monofilament wire test within normal limits  bilaterally. Muscle power within normal limits bilaterally.  Nails Thick disfigured discolored nails with subungual debris  from hallux to fifth toes bilaterally. No evidence of bacterial infection or drainage bilaterally.  Orthopedic  No limitations of motion  feet .  No crepitus or effusions noted.  No bony pathology or digital deformities noted.  Skin  normotropic skin with no porokeratosis noted bilaterally.  No signs of infections or ulcers noted.  Porokeratosis sub 5th metatarsal  B/L.   Onychomycosis  Pain in right toes  Pain in left toes  Porokeratosis B/L.  Consent was obtained for treatment procedures.   Mechanical debridement of nails 1-5  bilaterally performed with a nail nipper.  Filed with dremel without incident. No infection or ulcer.  Debridement of porokeratosis  Sub 5th met  B/L with # 15 blade.   Return office visit     3 months                 Told patient to return for periodic foot care and evaluation due to potential at risk complications.   Gardiner Barefoot DPM

## 2019-11-24 ENCOUNTER — Other Ambulatory Visit: Payer: Self-pay | Admitting: Cardiology

## 2019-11-24 MED ORDER — HYDRALAZINE HCL 50 MG PO TABS: 75.0000 mg | ORAL_TABLET | Freq: Three times a day (TID) | ORAL | 1 refills | Status: DC

## 2019-11-24 NOTE — Telephone Encounter (Signed)
*  STAT* If patient is at the pharmacy, call can be transferred to refill team.   1. Which medications need to be refilled? (please list name of each medication and dose if known) hydrALAZINE (APRESOLINE) 50 MG tablet  2. Which pharmacy/location (including street and city if local pharmacy) is medication to be sent to? [id number] - [place], [place] - [address]  3. Do they need a 30 day or 90 day supply? 90 day supply  Patient states he has a 1 week supply of medication remaining. Patient scheduled an appointment for 01/31/20 at 3:40 PM with Dr. Martinique.

## 2019-11-24 NOTE — Telephone Encounter (Signed)
LAST APPOINTMENT DATE:10/22/2019   NEXT APPOINTMENT DATE: Visit date not found   Rx APRESOLINE LAST REFILL: 07/12/2019 Done By Dr Peter Martinique   QTY: [phone]

## 2019-11-24 NOTE — Telephone Encounter (Signed)
Please make sure this was not supposed to go to cards.

## 2019-11-24 NOTE — Telephone Encounter (Signed)
Called patient to verified refill. Pt stated Dr Peter Martinique usually refilled Rx Hydralazine for him Pt will send refill request to Dr Collier Salina

## 2019-11-29 ENCOUNTER — Other Ambulatory Visit: Payer: Self-pay | Admitting: Family Medicine

## 2020-01-17 ENCOUNTER — Other Ambulatory Visit: Payer: Self-pay | Admitting: Family Medicine

## 2020-01-23 ENCOUNTER — Other Ambulatory Visit: Payer: Self-pay | Admitting: Family Medicine

## 2020-01-28 NOTE — Progress Notes (Signed)
Todd Spears Date of Birth: June 09, 1945 Medical Record #[id number]  History of Present Illness: Todd Spears is seen back today for follow up CAD. He is s/p 2 vessel PCI with Promus DES for 90% mid LAD and 95% mid RCA disease on 04/06/2012. His EF is normal at 55%. At that time he really had no significant anginal symptoms but had an abnormal stress test.  Myoview in Nov. 2014 showed a fixed inferior defect without ischemia. Last Myoview in Feb. 2017 was unchanged. Other issues include CKD stage 3, HTN, obesity, gout,,OA, ED, carotid bruit and asthma.   He was admitted in January 2017 with tremors and hyponatremia. Chlorthalidone was discontinued.   He had carotid dopplers showing 1-39% RICA and 58-52% LICA stenoses. Echo done in March 2017 showed normal LV function and mild AS unchanged from 2013. He has been followed in our lipid clinic for hypercholesterolemia with prior intolerance to statins. He is now on pravastatin 10 mg alternating with 20 mg daily. He is doing OK with this regimen but sometimes will start having more cramps so he will back off. On his last visit we started him on Zetia and he is tolerating this well.   On follow up today he is doing well. He denies any chest pain or SOB. No TIA or CVA symptoms. He is still working as a  Engineer, building services. Reports BP at home typically 13-145/60-65.    Allergies as of 01/31/2020      Reactions   Prednisone Shortness Of Breath, Swelling   Crestor [rosuvastatin Calcium] Other (See Comments)   myalgia   Lyrica [pregabalin]    "swelling"      Medication List       Accurate as of January 31, 2020  3:55 PM. If you have any questions, ask your nurse or doctor.        albuterol 108 (90 Base) MCG/ACT inhaler Commonly known as: VENTOLIN HFA INHALE 2 PUFFS INTO THE LUNGS EVERY 6 (SIX) HOURS AS NEEDED. FOR SHORTNESS OF BREATH   albuterol 108 (90 Base) MCG/ACT inhaler Commonly known as: ProAir HFA INHALE 2 PUFFS INTO THE LUNGS EVERY 6 (SIX) HOURS AS  NEEDED. FOR SHORTNESS OF BREATH   amLODipine 10 MG tablet Commonly known as: NORVASC TAKE 1 TABLET BY MOUTH EVERY DAY   aspirin 81 MG chewable tablet Chew 81 mg by mouth daily.   azelastine 0.1 % nasal spray Commonly known as: ASTELIN Place 2 sprays into both nostrils 2 (two) times daily. Use in each nostril as directed   cetirizine 10 MG tablet Commonly known as: ZYRTEC Take 10 mg by mouth every morning.   diclofenac 50 MG EC tablet Commonly known as: VOLTAREN Take 1 tablet (50 mg total) by mouth 2 (two) times daily.   diclofenac sodium 1 % Gel Commonly known as: VOLTAREN Apply 2 g topically 4 (four) times daily.   esomeprazole 40 MG capsule Commonly known as: NEXIUM TAKE 1 CAPSULE DAILY BEFOREBREAKFAST   ezetimibe 10 MG tablet Commonly known as: ZETIA Take 1 tablet (10 mg total) by mouth daily.   halobetasol 0.05 % cream Commonly known as: ULTRAVATE Apply topically 2 (two) times daily.   hydrALAZINE 50 MG tablet Commonly known as: APRESOLINE Take 1.5 tablets (75 mg total) by mouth in the morning, at noon, and at bedtime.   Integra Plus Caps TAKE 1 CAPSULE BY MOUTH EVERY DAY   lisinopril 40 MG tablet Commonly known as: ZESTRIL TAKE 1 TABLET BY MOUTH EVERY DAY   loratadine  10 MG tablet Commonly known as: CLARITIN Take 10 mg by mouth at bedtime.   Lumigan 0.01 % Soln Generic drug: bimatoprost   metoprolol succinate 50 MG 24 hr tablet Commonly known as: TOPROL-XL TAKE 1 TABLET EVERY DAY WITH OR IMMEDIATELY FOLLOWING A MEAL   mometasone 50 MCG/ACT nasal spray Commonly known as: NASONEX Place 1-2 sprays into the nose as needed.   montelukast 10 MG tablet Commonly known as: SINGULAIR TAKE 1 TABLET BY MOUTH EVERYDAY AT BEDTIME   multivitamin with minerals Tabs tablet Take 1 tablet by mouth daily.   nitroGLYCERIN 0.4 MG SL tablet Commonly known as: Nitrostat Place 1 tablet (0.4 mg total) under the tongue every 5 (five) minutes as needed for chest pain  (up to 3 doses. not within 24hrs of Viagra).   pravastatin 10 MG tablet Commonly known as: PRAVACHOL TAKE 1 TABLET EVERY OTHER DAY, ALTERNATING WITH 2 TABLETS.   Simply Saline 0.9 % Aers Generic drug: Saline Place 1 spray into the nose daily as needed (congestion).   tiZANidine 4 MG tablet Commonly known as: Zanaflex Take 1 tablet (4 mg total) by mouth every 6 (six) hours as needed for muscle spasms.   traMADol 50 MG tablet Commonly known as: ULTRAM Take 1 tablet (50 mg total) by mouth every 12 (twelve) hours.   VITAMIN B 12 PO Take 1 tablet by mouth daily.        Allergies  Allergen Reactions   Prednisone Shortness Of Breath and Swelling   Crestor [Rosuvastatin Calcium] Other (See Comments)    myalgia   Lyrica [Pregabalin]     "swelling"    Past Medical History:  Diagnosis Date   Allergic rhinitis    Anemia    Asthma    CAD (coronary artery disease)    a. s/p PTCA/stenting of the mid LAD and mid RCA 04/06/12   Carotid bruit    r   Colon polyp    Diverticulosis    ED (erectile dysfunction)    Fatty liver    GERD (gastroesophageal reflux disease)    Gout    Hyperlipidemia    Hypertension    Obesity    Osteoarthritis    Renal insufficiency    Restless leg syndrome     Past Surgical History:  Procedure Laterality Date   COLONOSCOPY     INGUINAL HERNIA REPAIR Right 02/27/2016   Procedure: HERNIA REPAIR INGUINAL ADULT;  Surgeon: Rolm Bookbinder, MD;  Location: [facility];  Service: General;  Laterality: Right;   INSERTION OF MESH Right 02/27/2016   Procedure: INSERTION OF MESH--IN AND OUT CATHETERIZATION PERFORMED AT END OF PROCEDURE TO DRAIN 400ML URINE.;  Surgeon: Rolm Bookbinder, MD;  Location: [facility];  Service: General;  Laterality: Right;   LYMPH NODE BIOPSY     right side of neck 1999 - benign   PERCUTANEOUS CORONARY STENT INTERVENTION (PCI-S) N/A 04/06/2012   Procedure: PERCUTANEOUS  CORONARY STENT INTERVENTION (PCI-S);  Surgeon: Leilynn Pilat M Martinique, MD; Promus DES for 90% mid LAD and 95% mid RCA disease      Social History   Tobacco Use  Smoking Status Former Smoker   Quit date: 06/04/1975   Years since quitting: 44.6  Smokeless Tobacco Never Used    Social History   Substance and Sexual Activity  Alcohol Use No   Alcohol/week: 0.0 standard drinks    Family History  Problem Relation Age of Onset   Coronary artery disease Father  in his 36s   Heart disease Father    Heart attack Father    Hypertension Mother    Stroke Mother        Several    Stomach cancer Mother    Glaucoma Mother    Hypertension Paternal Grandmother    Hypertension Paternal Grandfather    Colon cancer Neg Hx    Esophageal cancer Neg Hx    Rectal cancer Neg Hx     Review of Systems: The review of systems is per the HPI.  All other systems were reviewed and are negative.  Physical Exam: BP (!) 166/76    Pulse 68    Ht 5\' 7"  (1.702 m)    Wt 234 lb 3.2 oz (106.2 kg)    SpO2 98%    BMI 36.68 kg/m  GENERAL:  Well appearing, obese WM  HEENT:  PERRL, EOMI, sclera are clear. Oropharynx is clear. NECK:  No jugular venous distention, carotid upstroke brisk and symmetric, no bruits, no thyromegaly or adenopathy LUNGS:  Clear to auscultation bilaterally CHEST:  Unremarkable HEART:  RRR,  PMI not displaced or sustained,S1 and S2 within normal limits, no S3, no S4: no clicks, no rubs, gr 2/6 systolic murmur RUSB radiating to carotids.  ABD:  Soft, nontender. BS +, no masses or bruits. No hepatomegaly, no splenomegaly EXT:  2 + pulses throughout, no edema, no cyanosis no clubbing SKIN:  Warm and dry.  No rashes NEURO:  Alert and oriented x 3. Cranial nerves II through XII intact. PSYCH:  Cognitively intact      LABORATORY DATA: Lab Results  Component Value Date   WBC 6.1 04/14/2019   HGB 12.1 (L) 04/14/2019   HCT 35.5 (L) 04/14/2019   PLT 225.0 04/14/2019    GLUCOSE 114 (H) 04/14/2019   CHOL 152 04/14/2019   TRIG 133.0 04/14/2019   HDL 47.00 04/14/2019   LDLDIRECT 164.9 12/10/2010   LDLCALC 78 04/14/2019   ALT 28 04/14/2019   AST 24 04/14/2019   NA 134 (L) 04/14/2019   K 4.4 04/14/2019   CL 101 04/14/2019   CREATININE 1.14 04/14/2019   BUN 18 04/14/2019   CO2 26 04/14/2019   TSH 2.00 04/14/2019   PSA 0.60 04/14/2019   INR 1.0 04/03/2012   HGBA1C 5.8 04/14/2019   Ecg today shows NSR with first degree AV block. Rate 68. Otherwise normal. I have personally reviewed and interpreted this study.    Lexiscan myoview 07/28/15: Study Highlights     There was no ST segment deviation noted during stress.  The left ventricular ejection fraction is normal (55-65%).  Nuclear stress EF: 56%.  Defect 1: There is a medium defect of moderate severity present in the basal inferoseptal, basal inferior, mid inferior and apical inferior location. In setting of normal LVF, this is consistent with diaphragmatic attenuation artifact. No ischemia noted.  This is a low risk study.    Addendum by Sueanne Margarita, MD on Fri Jul 28, 2015 2:03 PM    There was no ST segment deviation noted during stress.  The left ventricular ejection fraction is normal (55-65%).  Nuclear stress EF: 56%.  Defect 1: There is a medium defect of moderate severity present in the basal inferoseptal, basal inferior, mid inferior and apical inferior location. In setting of normal LVF, this is consistent with diaphragmatic attenuation artifact. No ischemia noted.  This is a low risk study.    Echo: 08/25/15: Study Conclusions  - Left ventricle: The cavity size  was normal. Wall thickness was   normal. Systolic function was normal. The estimated ejection   fraction was in the range of 60% to 65%. Wall motion was normal;   there were no regional wall motion abnormalities. - Aortic valve: Moderately calcified annulus. Moderately calcified   leaflets. There was mild  stenosis. - Mitral valve: There was mild regurgitation. - Left atrium: The atrium was mildly dilated  Carotid dopplers 08/12/17: bilateral 40-59% ICA stenosis.   Assessment / Plan:  1. CAD - status post 2 vessel PCI with DES in November 2013- doing well.  Myoview study in February 2017 was low risk with an area of inferior scar and no ischemia. Unchanged from 2014.he remains asymptomatic.  We will continue medical therapy and risk factor modification.   2. HTN - blood pressure is elevated today but he reports good BP control at home.   He is on multiple meds. Will monitor on current therapy. Encourage weight loss and increased aerobic activity.   3. Hyponatremia secondary to chlorthalidone.  4. CKD stage 3.   5. Hyperlipidemia. Intolerant of statins at higher doses. Continue with  dietary modification and weight loss. Continue pravastatin. Excellent response to addition of Zetia. Is due for physical with Dr Jerline Pain so will get labs then.  6. Carotid arterial disease with moderate 30-16% LICA stenosis, 01-09% RICA stenosis. Asymptomatic. Recommend yearly follow up doppler.   7. History of mild Aortic stenosis.

## 2020-01-31 ENCOUNTER — Encounter: Payer: Self-pay | Admitting: Cardiology

## 2020-01-31 ENCOUNTER — Ambulatory Visit (INDEPENDENT_AMBULATORY_CARE_PROVIDER_SITE_OTHER): Payer: No Typology Code available for payment source | Admitting: Cardiology

## 2020-01-31 ENCOUNTER — Other Ambulatory Visit: Payer: Self-pay

## 2020-01-31 VITALS — BP 166/76 | HR 68 | Ht 67.0 in | Wt 234.2 lb

## 2020-01-31 DIAGNOSIS — I6523 Occlusion and stenosis of bilateral carotid arteries: Secondary | ICD-10-CM | POA: Diagnosis not present

## 2020-01-31 DIAGNOSIS — I10 Essential (primary) hypertension: Secondary | ICD-10-CM

## 2020-01-31 DIAGNOSIS — E7800 Pure hypercholesterolemia, unspecified: Secondary | ICD-10-CM

## 2020-01-31 DIAGNOSIS — I350 Nonrheumatic aortic (valve) stenosis: Secondary | ICD-10-CM

## 2020-01-31 DIAGNOSIS — I2510 Atherosclerotic heart disease of native coronary artery without angina pectoris: Secondary | ICD-10-CM

## 2020-02-02 ENCOUNTER — Other Ambulatory Visit: Payer: Self-pay

## 2020-02-02 MED ORDER — HYDRALAZINE HCL 50 MG PO TABS: 75.0000 mg | ORAL_TABLET | Freq: Three times a day (TID) | ORAL | 1 refills | Status: DC

## 2020-02-03 ENCOUNTER — Telehealth: Payer: Self-pay | Admitting: Cardiology

## 2020-02-03 NOTE — Telephone Encounter (Signed)
Pt c/o medication issue:  1. Name of Medication: hydrALAZINE (APRESOLINE) 50 MG tablet  2. How are you currently taking this medication (dosage and times per day)? 1.5 tablets 3 times a day  3. Are you having a reaction (difficulty breathing--STAT)? no  4. What is your medication issue? [facility] states the patient informed them the prescription was changed to 2 tablets 3 times a day instead of 1.5 tablets. She states they need to new prescription sent for 90 days.

## 2020-02-03 NOTE — Telephone Encounter (Signed)
I did not change his dose on his most recent visit. He was on 75 mg tid. This is what his prescription should be for.  Kimya Mccahill Martinique MD, [facility]

## 2020-02-04 NOTE — Telephone Encounter (Signed)
Spoke to patient he stated he has been taking Hydralazine 50 mg three times for the past 1 year.Advised I will send message to [employer] for advice.

## 2020-02-06 NOTE — Telephone Encounter (Signed)
OK then correct dose in MAR to 50 mg tid  Anisa Leanos Martinique MD, [facility]

## 2020-02-08 NOTE — Telephone Encounter (Signed)
Spoke to patient I misunderstood him.He stated he has been taking Hydralazine 50 mg 2 tablets ( 100 mg ) three times a day.Advised I will make Dr.Jordan aware.Med list updated.Stated B/P has been around 135/65.

## 2020-02-09 ENCOUNTER — Other Ambulatory Visit: Payer: Self-pay | Admitting: Adult Health

## 2020-02-19 ENCOUNTER — Other Ambulatory Visit: Payer: Self-pay | Admitting: Family Medicine

## 2020-02-22 ENCOUNTER — Ambulatory Visit: Payer: No Typology Code available for payment source | Admitting: Podiatry

## 2020-02-22 ENCOUNTER — Other Ambulatory Visit: Payer: Self-pay

## 2020-02-22 ENCOUNTER — Encounter: Payer: Self-pay | Admitting: Podiatry

## 2020-02-22 DIAGNOSIS — N182 Chronic kidney disease, stage 2 (mild): Secondary | ICD-10-CM

## 2020-02-22 DIAGNOSIS — M79675 Pain in left toe(s): Secondary | ICD-10-CM

## 2020-02-22 DIAGNOSIS — M79674 Pain in right toe(s): Secondary | ICD-10-CM | POA: Diagnosis not present

## 2020-02-22 DIAGNOSIS — M216X9 Other acquired deformities of unspecified foot: Secondary | ICD-10-CM | POA: Diagnosis not present

## 2020-02-22 DIAGNOSIS — B351 Tinea unguium: Secondary | ICD-10-CM

## 2020-02-22 DIAGNOSIS — Q828 Other specified congenital malformations of skin: Secondary | ICD-10-CM | POA: Diagnosis not present

## 2020-02-22 NOTE — Progress Notes (Signed)
This patient returns to my office for at risk foot care.  This patient requires this care by a professional since this patient will be at risk due to having chronic kidney disease.  Patient has painful callus on the outside bottom of both feet.  This patient is unable to cut nails himself since the patient cannot reach his nails.These nails are painful walking and wearing shoes. Patient has painful callus on the outside ball both feet.  Patient is unable to self treat.   This patient presents for at risk foot care today.  General Appearance  Alert, conversant and in no acute stress.  Vascular  Dorsalis pedis and posterior tibial  pulses are palpable  bilaterally.  Capillary return is within normal limits  bilaterally. Temperature is within normal limits  bilaterally.  Neurologic  Senn-Weinstein monofilament wire test within normal limits  bilaterally. Muscle power within normal limits bilaterally.  Nails Thick disfigured discolored nails with subungual debris  from hallux to fifth toes bilaterally. No evidence of bacterial infection or drainage bilaterally.  Orthopedic  No limitations of motion  feet .  No crepitus or effusions noted.  No bony pathology or digital deformities noted.  Skin  normotropic skin with no porokeratosis noted bilaterally.  No signs of infections or ulcers noted.  Porokeratosis sub 5th metatarsal  B/L.   Onychomycosis  Pain in right toes  Pain in left toes  Porokeratosis B/L.  Consent was obtained for treatment procedures.   Mechanical debridement of nails 1-5  bilaterally performed with a nail nipper.  Filed with dremel without incident. No infection or ulcer.  Debridement of porokeratosis  Sub 5th met  B/L with # 15 blade.   Return office visit     3 months                 Told patient to return for periodic foot care and evaluation due to potential at risk complications.   Kabrina Christiano DPM  

## 2020-03-06 ENCOUNTER — Other Ambulatory Visit: Payer: Self-pay | Admitting: Family Medicine

## 2020-03-06 DIAGNOSIS — J302 Other seasonal allergic rhinitis: Secondary | ICD-10-CM

## 2020-03-08 ENCOUNTER — Other Ambulatory Visit: Payer: Self-pay | Admitting: Cardiology

## 2020-03-12 ENCOUNTER — Other Ambulatory Visit: Payer: Self-pay | Admitting: Family Medicine

## 2020-03-16 ENCOUNTER — Other Ambulatory Visit: Payer: Self-pay

## 2020-03-16 ENCOUNTER — Encounter: Payer: Self-pay | Admitting: Family Medicine

## 2020-03-16 ENCOUNTER — Ambulatory Visit (INDEPENDENT_AMBULATORY_CARE_PROVIDER_SITE_OTHER): Payer: No Typology Code available for payment source

## 2020-03-16 DIAGNOSIS — Z23 Encounter for immunization: Secondary | ICD-10-CM

## 2020-03-27 ENCOUNTER — Other Ambulatory Visit: Payer: Self-pay

## 2020-03-27 ENCOUNTER — Other Ambulatory Visit (HOSPITAL_COMMUNITY): Payer: Self-pay | Admitting: Cardiology

## 2020-03-27 ENCOUNTER — Ambulatory Visit (HOSPITAL_COMMUNITY)
Admission: RE | Admit: 2020-03-27 | Discharge: 2020-03-27 | Disposition: A | Discharge status: Home / Self Care | Payer: No Typology Code available for payment source | Source: Ambulatory Visit | Attending: Cardiology | Admitting: Cardiology

## 2020-03-27 DIAGNOSIS — I6523 Occlusion and stenosis of bilateral carotid arteries: Secondary | ICD-10-CM

## 2020-04-12 ENCOUNTER — Other Ambulatory Visit: Payer: Self-pay | Admitting: Family Medicine

## 2020-04-13 ENCOUNTER — Ambulatory Visit (INDEPENDENT_AMBULATORY_CARE_PROVIDER_SITE_OTHER): Payer: No Typology Code available for payment source | Admitting: Family Medicine

## 2020-04-13 ENCOUNTER — Encounter: Payer: Self-pay | Admitting: Family Medicine

## 2020-04-13 ENCOUNTER — Other Ambulatory Visit: Payer: Self-pay

## 2020-04-13 VITALS — BP 159/66 | HR 69 | Temp 98.2°F | Ht 67.0 in | Wt 234.2 lb

## 2020-04-13 DIAGNOSIS — E7800 Pure hypercholesterolemia, unspecified: Secondary | ICD-10-CM | POA: Diagnosis not present

## 2020-04-13 DIAGNOSIS — Z0001 Encounter for general adult medical examination with abnormal findings: Secondary | ICD-10-CM | POA: Diagnosis not present

## 2020-04-13 DIAGNOSIS — I10 Essential (primary) hypertension: Secondary | ICD-10-CM

## 2020-04-13 DIAGNOSIS — I779 Disorder of arteries and arterioles, unspecified: Secondary | ICD-10-CM

## 2020-04-13 DIAGNOSIS — R739 Hyperglycemia, unspecified: Secondary | ICD-10-CM

## 2020-04-13 DIAGNOSIS — N182 Chronic kidney disease, stage 2 (mild): Secondary | ICD-10-CM

## 2020-04-13 DIAGNOSIS — H9192 Unspecified hearing loss, left ear: Secondary | ICD-10-CM

## 2020-04-13 DIAGNOSIS — J309 Allergic rhinitis, unspecified: Secondary | ICD-10-CM

## 2020-04-13 DIAGNOSIS — Z125 Encounter for screening for malignant neoplasm of prostate: Secondary | ICD-10-CM

## 2020-04-13 MED ORDER — INTEGRA PLUS PO CAPS
1.0000 | ORAL_CAPSULE | Freq: Every day | ORAL | 3 refills | Status: DC
Start: 2020-04-13 — End: 2020-05-01

## 2020-04-13 MED ORDER — HYDRALAZINE HCL 50 MG PO TABS: 100.0000 mg | ORAL_TABLET | Freq: Three times a day (TID) | ORAL | 3 refills | Status: DC

## 2020-04-13 NOTE — Assessment & Plan Note (Signed)
Check CMET today.

## 2020-04-13 NOTE — Assessment & Plan Note (Signed)
Check A1c. 

## 2020-04-13 NOTE — Patient Instructions (Signed)
It was very nice to see you today!  I will refill your hydralazine.  I will place a referral for you to see the audiologist.  We will check blood work today.  Please come back to see me in 1 year.  Come back to see me sooner if needed.  Take care, Dr Jerline Pain  Please try these tips to maintain a healthy lifestyle:   Eat at least 3 REAL meals and 1-2 snacks per day.  Aim for no more than 5 hours between eating.  If you eat breakfast, please do so within one hour of getting up.    Each meal should contain half fruits/vegetables, one quarter protein, and one quarter carbs (no bigger than a computer mouse)   Cut down on sweet beverages. This includes juice, soda, and sweet tea.     Drink at least 1 glass of water with each meal and aim for at least 8 glasses per day   Exercise at least 150 minutes every week.    Preventive Care 26 Years and Older, Male Preventive care refers to lifestyle choices and visits with your health care provider that can promote health and wellness. This includes:  A yearly physical exam. This is also called an annual well check.  Regular dental and eye exams.  Immunizations.  Screening for certain conditions.  Healthy lifestyle choices, such as diet and exercise. What can I expect for my preventive care visit? Physical exam Your health care provider will check:  Height and weight. These may be used to calculate body mass index (BMI), which is a measurement that tells if you are at a healthy weight.  Heart rate and blood pressure.  Your skin for abnormal spots. Counseling Your health care provider may ask you questions about:  Alcohol, tobacco, and drug use.  Emotional well-being.  Home and relationship well-being.  Sexual activity.  Eating habits.  History of falls.  Memory and ability to understand (cognition).  Work and work Statistician. What immunizations do I need?  Influenza (flu) vaccine  This is recommended every  year. Tetanus, diphtheria, and pertussis (Tdap) vaccine  You may need a Td booster every 10 years. Varicella (chickenpox) vaccine  You may need this vaccine if you have not already been vaccinated. Zoster (shingles) vaccine  You may need this after age 20. Pneumococcal conjugate (PCV13) vaccine  One dose is recommended after age 67. Pneumococcal polysaccharide (PPSV23) vaccine  One dose is recommended after age 73. Measles, mumps, and rubella (MMR) vaccine  You may need at least one dose of MMR if you were born in 1957 or later. You may also need a second dose. Meningococcal conjugate (MenACWY) vaccine  You may need this if you have certain conditions. Hepatitis A vaccine  You may need this if you have certain conditions or if you travel or work in places where you may be exposed to hepatitis A. Hepatitis B vaccine  You may need this if you have certain conditions or if you travel or work in places where you may be exposed to hepatitis B. Haemophilus influenzae type b (Hib) vaccine  You may need this if you have certain conditions. You may receive vaccines as individual doses or as more than one vaccine together in one shot (combination vaccines). Talk with your health care provider about the risks and benefits of combination vaccines. What tests do I need? Blood tests  Lipid and cholesterol levels. These may be checked every 5 years, or more frequently depending on your  overall health.  Hepatitis C test.  Hepatitis B test. Screening  Lung cancer screening. You may have this screening every year starting at age 72 if you have a 30-pack-year history of smoking and currently smoke or have quit within the past 15 years.  Colorectal cancer screening. All adults should have this screening starting at age 21 and continuing until age 69. Your health care provider may recommend screening at age 33 if you are at increased risk. You will have tests every 1-10 years, depending on  your results and the type of screening test.  Prostate cancer screening. Recommendations will vary depending on your family history and other risks.  Diabetes screening. This is done by checking your blood sugar (glucose) after you have not eaten for a while (fasting). You may have this done every 1-3 years.  Abdominal aortic aneurysm (AAA) screening. You may need this if you are a current or former smoker.  Sexually transmitted disease (STD) testing. Follow these instructions at home: Eating and drinking  Eat a diet that includes fresh fruits and vegetables, whole grains, lean protein, and low-fat dairy products. Limit your intake of foods with high amounts of sugar, saturated fats, and salt.  Take vitamin and mineral supplements as recommended by your health care provider.  Do not drink alcohol if your health care provider tells you not to drink.  If you drink alcohol: ? Limit how much you have to 0-2 drinks a day. ? Be aware of how much alcohol is in your drink. In the [place] one drink equals one 12 oz bottle of beer (355 mL), one 5 oz glass of wine (148 mL), or one 1 oz glass of hard liquor (44 mL). Lifestyle  Take daily care of your teeth and gums.  Stay active. Exercise for at least 30 minutes on 5 or more days each week.  Do not use any products that contain nicotine or tobacco, such as cigarettes, e-cigarettes, and chewing tobacco. If you need help quitting, ask your health care provider.  If you are sexually active, practice safe sex. Use a condom or other form of protection to prevent STIs (sexually transmitted infections).  Talk with your health care provider about taking a low-dose aspirin or statin. What's next?  Visit your health care provider once a year for a well check visit.  Ask your health care provider how often you should have your eyes and teeth checked.  Stay up to date on all vaccines. This information is not intended to replace advice given to you by  your health care provider. Make sure you discuss any questions you have with your health care provider. Document Revised: 05/14/2018 Document Reviewed: 05/14/2018 Elsevier Patient Education  2020 [employer].

## 2020-04-13 NOTE — Assessment & Plan Note (Signed)
Check lipid panel. Continue pravastatin. 

## 2020-04-13 NOTE — Assessment & Plan Note (Signed)
Above goal today though typically well controlled at home.  We will continue Norvasc 10 mg daily, lisinopril 20 mg daily, metoprolol succinate 50 mg daily, and hydralazine 200 mg 3 times daily.

## 2020-04-13 NOTE — Progress Notes (Signed)
Chief Complaint:  Todd Spears is a 74 y.o. male who presents today for his annual comprehensive physical exam.    Assessment/Plan:  New/Acute Problems: Left Ear Hearing Loss Will place referral to audiology.   Chronic Problems Addressed Today: Allergic rhinitis Stable.  Continue Astelin, Claritin, and Singulair.  Essential hypertension Above goal today though typically well controlled at home.  We will continue Norvasc 10 mg daily, lisinopril 20 mg daily, metoprolol succinate 50 mg daily, and hydralazine 200 mg 3 times daily.  Hyperlipidemia Check lipid panel.  Continue pravastatin.   Carotid arterial disease (HCC) Continue aspirin and statin.  CKD (chronic kidney disease) stage 2, GFR 60-89 ml/min Check CMET today.  Hyperglycemia Check A1c.   Preventative Healthcare: Due for colonoscopy next year.  Up-to-date on Covid and flu vaccine.  Advised him to get shingles vaccine but check with insurance first.  Check CBC, CMET, TSH, PSA, lipid panel.  Patient Counseling(The following topics were reviewed and/or handout was given):  -Nutrition: Stressed importance of moderation in sodium/caffeine intake, saturated fat and cholesterol, caloric balance, sufficient intake of fresh fruits, vegetables, and fiber.  -Stressed the importance of regular exercise.   -Substance Abuse: Discussed cessation/primary prevention of tobacco, alcohol, or other drug use; driving or other dangerous activities under the influence; availability of treatment for abuse.   -Injury prevention: Discussed safety belts, safety helmets, smoke detector, smoking near bedding or upholstery.   -Sexuality: Discussed sexually transmitted diseases, partner selection, use of condoms, avoidance of unintended pregnancy and contraceptive alternatives.   -Dental health: Discussed importance of regular tooth brushing, flossing, and dental visits.  -Health maintenance and immunizations reviewed. Please refer to Health  maintenance section.  Return to care in 1 year for next preventative visit.     Subjective:  HPI:  He has no acute complaints today.   Lifestyle Diet: Tries to watch what he eats.  Exercise: Walking more.   Depression screen PHQ 2/9 04/13/2020  Decreased Interest 0  Down, Depressed, Hopeless 0  PHQ - 2 Score 0  Some recent data might be hidden   ROS: Per HPI, otherwise a complete review of systems was negative.   PMH:  The following were reviewed and entered/updated in epic: Past Medical History:  Diagnosis Date  . Allergic rhinitis   . Anemia   . Asthma   . CAD (coronary artery disease)    a. s/p PTCA/stenting of the mid LAD and mid RCA 04/06/12  . Carotid bruit    r  . Colon polyp   . Diverticulosis   . ED (erectile dysfunction)   . Fatty liver   . GERD (gastroesophageal reflux disease)   . Gout   . Hyperlipidemia   . Hypertension   . Obesity   . Osteoarthritis   . Renal insufficiency   . Restless leg syndrome    Patient Active Problem List   Diagnosis Date Noted  . Hyperglycemia 04/13/2020  . Pain due to onychomycosis of toenails of both feet 11/18/2018  . Porokeratosis 11/18/2018  . Gout 06/18/2018  . Osteoarthritis 06/18/2018  . Carotid arterial disease ([place]) 08/10/2015  . Aortic stenosis, mild 08/10/2015  . CKD (chronic kidney disease) stage 2, GFR 60-89 ml/min 07/03/2015  . CAD s/p PCI 2013   . Hyperlipidemia 11/27/2006  . Essential hypertension 11/27/2006  . Allergic rhinitis 11/27/2006  . GERD 11/27/2006   Past Surgical History:  Procedure Laterality Date  . COLONOSCOPY    . INGUINAL HERNIA REPAIR Right 02/27/2016   Procedure:  HERNIA REPAIR INGUINAL ADULT;  Surgeon: Rolm Bookbinder, MD;  Location: [facility];  Service: General;  Laterality: Right;  . INSERTION OF MESH Right 02/27/2016   Procedure: INSERTION OF MESH--IN AND OUT CATHETERIZATION PERFORMED AT END OF PROCEDURE TO DRAIN 400ML URINE.;  Surgeon: Rolm Bookbinder,  MD;  Location: [facility];  Service: General;  Laterality: Right;  . LYMPH NODE BIOPSY     right side of neck 1999 - benign  . PERCUTANEOUS CORONARY STENT INTERVENTION (PCI-S) N/A 04/06/2012   Procedure: PERCUTANEOUS CORONARY STENT INTERVENTION (PCI-S);  Surgeon: Peter M Martinique, MD; Promus DES for 90% mid LAD and 95% mid RCA disease      Family History  Problem Relation Age of Onset  . Coronary artery disease Father        in his 63s  . Heart disease Father   . Heart attack Father   . Hypertension Mother   . Stroke Mother        Several   . Stomach cancer Mother   . Glaucoma Mother   . Hypertension Paternal Grandmother   . Hypertension Paternal Grandfather   . Colon cancer Neg Hx   . Esophageal cancer Neg Hx   . Rectal cancer Neg Hx     Medications- reviewed and updated Current Outpatient Medications  Medication Sig Dispense Refill  . albuterol (PROAIR HFA) 108 (90 Base) MCG/ACT inhaler INHALE 2 PUFFS INTO THE LUNGS EVERY 6 (SIX) HOURS AS NEEDED. FOR SHORTNESS OF BREATH 18 g 3  . albuterol (VENTOLIN HFA) 108 (90 Base) MCG/ACT inhaler INHALE 2 PUFFS INTO THE LUNGS EVERY 6 (SIX) HOURS AS NEEDED. FOR SHORTNESS OF BREATH 6.7 g 2  . amLODipine (NORVASC) 10 MG tablet TAKE 1 TABLET BY MOUTH EVERY DAY 90 tablet 2  . aspirin 81 MG chewable tablet Chew 81 mg by mouth daily.    Marland Kitchen azelastine (ASTELIN) 0.1 % nasal spray PLACE 2 SPRAYS INTO BOTH NOSTRILS 2 (TWO) TIMES DAILY. USE IN EACH NOSTRIL AS DIRECTED 30 mL 4  . cetirizine (ZYRTEC) 10 MG tablet Take 10 mg by mouth every morning.    . Cyanocobalamin (VITAMIN B 12 PO) Take 1 tablet by mouth daily.     . diclofenac (VOLTAREN) 50 MG EC tablet Take 1 tablet (50 mg total) by mouth 2 (two) times daily. 30 tablet 0  . diclofenac sodium (VOLTAREN) 1 % GEL Apply 2 g topically 4 (four) times daily. 100 g 3  . esomeprazole (NEXIUM) 40 MG capsule TAKE 1 CAPSULE BY MOUTH DAILY BEFORE MEALS 90 capsule 2  . ezetimibe (ZETIA) 10 MG tablet  TAKE 1 TABLET BY MOUTH EVERY DAY 90 tablet 1  . FeFum-FePoly-FA-B Cmp-C-Biot (INTEGRA PLUS) CAPS Take 1 capsule by mouth daily. 90 capsule 3  . hydrALAZINE (APRESOLINE) 50 MG tablet Take 2 tablets (100 mg total) by mouth 3 (three) times daily. Take 2 tablets ( 100 mg ) three times a day 540 tablet 3  . lisinopril (ZESTRIL) 40 MG tablet TAKE 1 TABLET BY MOUTH EVERY DAY 90 tablet 1  . loratadine (CLARITIN) 10 MG tablet Take 10 mg by mouth at bedtime.     . metoprolol succinate (TOPROL-XL) 50 MG 24 hr tablet TAKE 1 TABLET EVERY DAY WITH OR IMMEDIATELY FOLLOWING A MEAL 90 tablet 2  . montelukast (SINGULAIR) 10 MG tablet TAKE 1 TABLET BY MOUTH EVERYDAY AT BEDTIME 90 tablet 3  . Multiple Vitamin (MULTIVITAMIN WITH MINERALS) TABS tablet Take 1 tablet by mouth daily.    Marland Kitchen  nitroGLYCERIN (NITROSTAT) 0.4 MG SL tablet Place 1 tablet (0.4 mg total) under the tongue every 5 (five) minutes as needed for chest pain (up to 3 doses. not within 24hrs of Viagra). 25 tablet 6  . pravastatin (PRAVACHOL) 10 MG tablet TAKE 1 TABLET EVERY OTHER DAY, ALTERNATING WITH 2 TABLETS. 135 tablet 1  . tiZANidine (ZANAFLEX) 4 MG tablet Take 1 tablet (4 mg total) by mouth every 6 (six) hours as needed for muscle spasms. 30 tablet 0  . traMADol (ULTRAM) 50 MG tablet Take 1 tablet (50 mg total) by mouth every 12 (twelve) hours. 60 tablet 0   No current facility-administered medications for this visit.    Allergies-reviewed and updated Allergies  Allergen Reactions  . Prednisone Shortness Of Breath and Swelling  . Crestor [Rosuvastatin Calcium] Other (See Comments)    myalgia  . Lyrica [Pregabalin]     "swelling"    Social History   Socioeconomic History  . Marital status: Married    Spouse name: Not on file  . Number of children: 1  . Years of education: Not on file  . Highest education level: Not on file  Occupational History    Employer: [employer]  Tobacco Use  . Smoking status: Former Smoker    Quit  date: 06/04/1975    Years since quitting: 44.8  . Smokeless tobacco: Never Used  Vaping Use  . Vaping Use: Never used  Substance and Sexual Activity  . Alcohol use: No    Alcohol/week: 0.0 standard drinks  . Drug use: No  . Sexual activity: Not Currently  Other Topics Concern  . Not on file  Social History Narrative   Works as a Engineer, building services    Married   One son ( lives in [place])    Regular exercise- no   Social Determinants of Health   Financial Resource Strain:   . Difficulty of Paying Living Expenses: Not on file  Food Insecurity:   . Worried About Charity fundraiser in the Last Year: Not on file  . Ran Out of Food in the Last Year: Not on file  Transportation Needs:   . Lack of Transportation (Medical): Not on file  . Lack of Transportation (Non-Medical): Not on file  Physical Activity:   . Days of Exercise per Week: Not on file  . Minutes of Exercise per Session: Not on file  Stress:   . Feeling of Stress : Not on file  Social Connections:   . Frequency of Communication with Friends and Family: Not on file  . Frequency of Social Gatherings with Friends and Family: Not on file  . Attends Religious Services: Not on file  . Active Member of Clubs or Organizations: Not on file  . Attends Archivist Meetings: Not on file  . Marital Status: Not on file        Objective:  Physical Exam: BP (!) 159/66   Pulse 69   Temp 98.2 F (36.8 C) (Temporal)   Ht 5\' 7"  (1.702 m)   Wt 234 lb 3.2 oz (106.2 kg)   SpO2 98%   BMI 36.68 kg/m   Body mass index is 36.68 kg/m. Wt Readings from Last 3 Encounters:  04/13/20 234 lb 3.2 oz (106.2 kg)  01/31/20 234 lb 3.2 oz (106.2 kg)  10/12/19 236 lb 6.4 oz (107.2 kg)   Gen: NAD, resting comfortably HEENT: TMs normal bilaterally. OP clear. No thyromegaly noted.  CV: RRR with no murmurs appreciated Pulm:  NWOB, CTAB with no crackles, wheezes, or rhonchi GI: Normal bowel sounds present. Soft, Nontender,  Nondistended. MSK: no edema, cyanosis, or clubbing noted Skin: warm, dry Neuro: CN2-12 grossly intact. Strength 5/5 in upper and lower extremities. Reflexes symmetric and intact bilaterally.  Psych: Normal affect and thought content     Ingrid Shifrin M. Jerline Pain, MD 04/13/2020 9:42 AM

## 2020-04-13 NOTE — Assessment & Plan Note (Signed)
Stable.  Continue Astelin, Claritin, and Singulair.

## 2020-04-13 NOTE — Assessment & Plan Note (Signed)
Continue aspirin and statin. 

## 2020-04-14 LAB — HEMOGLOBIN A1C
Hgb A1c MFr Bld: 5.7 % of total Hgb — ABNORMAL HIGH (ref <5.7)
Mean Plasma Glucose: 117 (calc)
eAG (mmol/L): 6.5 (calc)

## 2020-04-14 LAB — CBC
HCT: 34.7 % — ABNORMAL LOW (ref 38.5–50.0)
Hemoglobin: 11.8 g/dL — ABNORMAL LOW (ref 13.2–17.1)
MCH: 31.6 pg (ref 27.0–33.0)
MCHC: 34 g/dL (ref 32.0–36.0)
MCV: 92.8 fL (ref 80.0–100.0)
MPV: 10.4 fL (ref 7.5–12.5)
Platelets: 223 10*3/uL (ref 140–400)
RBC: 3.74 10*6/uL — ABNORMAL LOW (ref 4.20–5.80)
RDW: 12 % (ref 11.0–15.0)
WBC: 4.9 10*3/uL (ref 3.8–10.8)

## 2020-04-14 LAB — COMPREHENSIVE METABOLIC PANEL
AG Ratio: 1.4 (calc) (ref 1.0–2.5)
ALT: 27 U/L (ref 9–46)
AST: 26 U/L (ref 10–35)
Albumin: 3.9 g/dL (ref 3.6–5.1)
Alkaline phosphatase (APISO): 46 U/L (ref 35–144)
BUN: 13 mg/dL (ref 7–25)
CO2: 26 mmol/L (ref 20–32)
Calcium: 9 mg/dL (ref 8.6–10.3)
Chloride: 103 mmol/L (ref 98–110)
Creat: 1.13 mg/dL (ref 0.70–1.18)
Globulin: 2.7 g/dL (calc) (ref 1.9–3.7)
Glucose, Bld: 103 mg/dL — ABNORMAL HIGH (ref 65–99)
Potassium: 4.3 mmol/L (ref 3.5–5.3)
Sodium: 137 mmol/L (ref 135–146)
Total Bilirubin: 0.7 mg/dL (ref 0.2–1.2)
Total Protein: 6.6 g/dL (ref 6.1–8.1)

## 2020-04-14 LAB — LIPID PANEL
Cholesterol: 145 mg/dL (ref <200)
HDL: 50 mg/dL (ref >=40)
LDL Cholesterol (Calc): 77 mg/dL (calc)
Non-HDL Cholesterol (Calc): 95 mg/dL (calc) (ref <130)
Total CHOL/HDL Ratio: 2.9 (calc) (ref <5.0)
Triglycerides: 99 mg/dL (ref <150)

## 2020-04-14 LAB — PSA: PSA: 0.77 ng/mL (ref <=4.0)

## 2020-04-14 LAB — TSH: TSH: 2.04 mIU/L (ref 0.40–4.50)

## 2020-04-14 NOTE — Progress Notes (Signed)
Please inform patient of the following:  Labs are all STABLE. Would like for him to keep up the good work and we can recheck in a year.  Todd Spears. Jerline Pain, MD 04/14/2020 8:00 AM

## 2020-04-30 ENCOUNTER — Other Ambulatory Visit: Payer: Self-pay | Admitting: Family Medicine

## 2020-04-30 DIAGNOSIS — J302 Other seasonal allergic rhinitis: Secondary | ICD-10-CM

## 2020-05-10 ENCOUNTER — Other Ambulatory Visit: Payer: Self-pay | Admitting: Family Medicine

## 2020-05-23 ENCOUNTER — Other Ambulatory Visit: Payer: Self-pay

## 2020-05-23 ENCOUNTER — Encounter: Payer: Self-pay | Admitting: Podiatry

## 2020-05-23 ENCOUNTER — Ambulatory Visit: Payer: No Typology Code available for payment source | Admitting: Podiatry

## 2020-05-23 DIAGNOSIS — M216X9 Other acquired deformities of unspecified foot: Secondary | ICD-10-CM

## 2020-05-23 DIAGNOSIS — M79674 Pain in right toe(s): Secondary | ICD-10-CM

## 2020-05-23 DIAGNOSIS — B351 Tinea unguium: Secondary | ICD-10-CM

## 2020-05-23 DIAGNOSIS — Q828 Other specified congenital malformations of skin: Secondary | ICD-10-CM

## 2020-05-23 DIAGNOSIS — N182 Chronic kidney disease, stage 2 (mild): Secondary | ICD-10-CM

## 2020-05-23 DIAGNOSIS — M79675 Pain in left toe(s): Secondary | ICD-10-CM | POA: Diagnosis not present

## 2020-06-04 ENCOUNTER — Other Ambulatory Visit: Payer: Self-pay | Admitting: Family Medicine

## 2020-06-04 DIAGNOSIS — J302 Other seasonal allergic rhinitis: Secondary | ICD-10-CM

## 2020-06-15 ENCOUNTER — Telehealth (INDEPENDENT_AMBULATORY_CARE_PROVIDER_SITE_OTHER): Payer: No Typology Code available for payment source | Admitting: Family Medicine

## 2020-06-15 DIAGNOSIS — R0981 Nasal congestion: Secondary | ICD-10-CM | POA: Diagnosis not present

## 2020-06-15 DIAGNOSIS — R059 Cough, unspecified: Secondary | ICD-10-CM

## 2020-06-15 MED ORDER — BENZONATATE 100 MG PO CAPS: 100.0000 mg | ORAL_CAPSULE | Freq: Three times a day (TID) | ORAL | 0 refills | Status: DC | PRN

## 2020-06-15 NOTE — Patient Instructions (Addendum)
   ---------------------------------------------------------------------------------------------------------------------------      WORK SLIP:  Patient Todd Spears,  [birth year], was seen for a medical visit today, 06/15/20 . Please excuse from work for a COVID like illness. We advise 10 days minimum from the onset of symptoms (06/14/20) PLUS 1 day of no fever and improved symptoms. Will defer to employer for a sooner return to work if symptoms have resolved, it is greater than 5 days since the positive test and the patient can wear a high-quality, tight fitting mask such as N95 or KN95 at all times for an additional 5 days. Would also suggest COVID19 antigen testing is negative prior to return.  Sincerely: E-signature: Dr. Colin Benton, DO [facility] Ph: [phone]   ------------------------------------------------------------------------------------------------------------------------------    HOME CARE TIPS:  -[facility] testing information: https://[email or web address].org/ OR 304-117-8293 Most pharmacies also offer testing and home test kits.  -I sent the medication(s) we discussed to your pharmacy: Meds ordered this encounter  Medications  . benzonatate (TESSALON PERLES) 100 MG capsule    Sig: Take 1 capsule (100 mg total) by mouth 3 (three) times daily as needed.    Dispense:  20 capsule    Refill:  0     -can use tylenol if needed for fevers, aches and pains per instructions  -can use nasal saline a few times per day if nasal congestion  -stay hydrated, drink plenty of fluids and eat small healthy meals - avoid dairy  -If the Covid test is positive, schedule virtual follow up with your primary care office and check out the CDC website for more information on home care, transmission and treatment for COVID19  -follow up with your doctor in 2-3 days unless improving and feeling better  -stay home while sick,  except to seek medical care, and if you have COVID19 please stay home for a full 10 days since the onset of symptoms PLUS one day of no fever and feeling better.  It was nice to meet you today, and I really hope you are feeling better soon. I help [facility] out with telemedicine visits on Tuesdays and Thursdays and am available for visits on those days. If you have any concerns or questions following this visit please schedule a follow up visit with your Primary Care doctor or seek care at a local urgent care clinic to avoid delays in care.    Seek in person care promptly if your symptoms worsen, new concerns arise or you are not improving with treatment. Call 911 and/or seek emergency care if you symptoms are severe or life threatening.

## 2020-06-15 NOTE — Progress Notes (Signed)
Virtual Visit via Video Note  I connected with Todd Spears  on 06/15/20 at 10:40 AM EST by a video enabled telemedicine application and verified that I am speaking with the correct person using two identifiers.  Location patient: home, [place] Location provider:work or home office Persons participating in the virtual visit: patient, provider  I discussed the limitations of evaluation and management by telemedicine and the availability of in person appointments. The patient expressed understanding and agreed to proceed.   HPI:  Acute telemedicine visit for sinus congestion or cough: -Onset: started 2-3 days ago -Symptoms include: nasal congestion clear,tickle in throat, a little sinus discomfort, temp was 100 but 98 when he rechecked, cough -Denies: CP, SOB, NVD, body aches, malaise - reports "I feel fine and went to work" -Pertinent past medical history: reports history of chronic sinus issues and uses nasal spray, HTN, CAD, CKD -Pertinent medication allergies: lyrica, crestor -COVID-19 vaccine status: fully vaccinated and boosted  ROS: See pertinent positives and negatives per HPI.  Past Medical History:  Diagnosis Date  . Allergic rhinitis   . Anemia   . Asthma   . CAD (coronary artery disease)    a. s/p PTCA/stenting of the mid LAD and mid RCA 04/06/12  . Carotid bruit    r  . Colon polyp   . Diverticulosis   . ED (erectile dysfunction)   . Fatty liver   . GERD (gastroesophageal reflux disease)   . Gout   . Hyperlipidemia   . Hypertension   . Obesity   . Osteoarthritis   . Renal insufficiency   . Restless leg syndrome     Past Surgical History:  Procedure Laterality Date  . COLONOSCOPY    . INGUINAL HERNIA REPAIR Right 02/27/2016   Procedure: HERNIA REPAIR INGUINAL ADULT;  Surgeon: Rolm Bookbinder, MD;  Location: [facility];  Service: General;  Laterality: Right;  . INSERTION OF MESH Right 02/27/2016   Procedure: INSERTION OF MESH--IN AND OUT CATHETERIZATION  PERFORMED AT END OF PROCEDURE TO DRAIN 400ML URINE.;  Surgeon: Rolm Bookbinder, MD;  Location: [facility];  Service: General;  Laterality: Right;  . LYMPH NODE BIOPSY     right side of neck 1999 - benign  . PERCUTANEOUS CORONARY STENT INTERVENTION (PCI-S) N/A 04/06/2012   Procedure: PERCUTANEOUS CORONARY STENT INTERVENTION (PCI-S);  Surgeon: Peter M Martinique, MD; Promus DES for 90% mid LAD and 95% mid RCA disease       Current Outpatient Medications:  .  benzonatate (TESSALON PERLES) 100 MG capsule, Take 1 capsule (100 mg total) by mouth 3 (three) times daily as needed., Disp: 20 capsule, Rfl: 0 .  albuterol (PROAIR HFA) 108 (90 Base) MCG/ACT inhaler, INHALE 2 PUFFS INTO THE LUNGS EVERY 6 (SIX) HOURS AS NEEDED. FOR SHORTNESS OF BREATH, Disp: 18 g, Rfl: 3 .  albuterol (VENTOLIN HFA) 108 (90 Base) MCG/ACT inhaler, INHALE 2 PUFFS INTO THE LUNGS EVERY 6 (SIX) HOURS AS NEEDED. FOR SHORTNESS OF BREATH, Disp: 6.7 g, Rfl: 2 .  amLODipine (NORVASC) 10 MG tablet, TAKE 1 TABLET BY MOUTH EVERY DAY, Disp: 90 tablet, Rfl: 2 .  aspirin 81 MG chewable tablet, Chew 81 mg by mouth daily., Disp: , Rfl:  .  azelastine (ASTELIN) 0.1 % nasal spray, INSTILL 2 SPRAYS INTO BOTH NOSTRILS 2 TIMES DAILY. USE IN EACH NOSTRIL AS DIRECTED, Disp: 30 mL, Rfl: 1 .  cetirizine (ZYRTEC) 10 MG tablet, Take 10 mg by mouth every morning., Disp: , Rfl:  .  Cyanocobalamin (  VITAMIN B 12 PO), Take 1 tablet by mouth daily. , Disp: , Rfl:  .  diclofenac (VOLTAREN) 50 MG EC tablet, Take 1 tablet (50 mg total) by mouth 2 (two) times daily., Disp: 30 tablet, Rfl: 0 .  diclofenac sodium (VOLTAREN) 1 % GEL, Apply 2 g topically 4 (four) times daily., Disp: 100 g, Rfl: 3 .  esomeprazole (NEXIUM) 40 MG capsule, TAKE 1 CAPSULE BY MOUTH DAILY BEFORE MEALS, Disp: 90 capsule, Rfl: 2 .  ezetimibe (ZETIA) 10 MG tablet, TAKE 1 TABLET BY MOUTH EVERY DAY, Disp: 90 tablet, Rfl: 1 .  FeFum-FePoly-FA-B Cmp-C-Biot (INTEGRA PLUS) CAPS, TAKE 1  CAPSULE BY MOUTH EVERY DAY, Disp: 90 capsule, Rfl: 3 .  hydrALAZINE (APRESOLINE) 50 MG tablet, Take 2 tablets (100 mg total) by mouth 3 (three) times daily. Take 2 tablets ( 100 mg ) three times a day, Disp: 540 tablet, Rfl: 3 .  lisinopril (ZESTRIL) 40 MG tablet, TAKE 1 TABLET BY MOUTH EVERY DAY, Disp: 90 tablet, Rfl: 1 .  loratadine (CLARITIN) 10 MG tablet, Take 10 mg by mouth at bedtime. , Disp: , Rfl:  .  metoprolol succinate (TOPROL-XL) 50 MG 24 hr tablet, TAKE 1 TABLET EVERY DAY WITH OR IMMEDIATELY FOLLOWING A MEAL, Disp: 90 tablet, Rfl: 2 .  montelukast (SINGULAIR) 10 MG tablet, TAKE 1 TABLET BY MOUTH EVERYDAY AT BEDTIME, Disp: 90 tablet, Rfl: 3 .  Multiple Vitamin (MULTIVITAMIN WITH MINERALS) TABS tablet, Take 1 tablet by mouth daily., Disp: , Rfl:  .  nitroGLYCERIN (NITROSTAT) 0.4 MG SL tablet, Place 1 tablet (0.4 mg total) under the tongue every 5 (five) minutes as needed for chest pain (up to 3 doses. not within 24hrs of Viagra)., Disp: 25 tablet, Rfl: 6 .  pravastatin (PRAVACHOL) 10 MG tablet, TAKE 1 TABLET EVERY OTHER DAY, ALTERNATING WITH 2 TABLETS., Disp: 135 tablet, Rfl: 1 .  tiZANidine (ZANAFLEX) 4 MG tablet, Take 1 tablet (4 mg total) by mouth every 6 (six) hours as needed for muscle spasms., Disp: 30 tablet, Rfl: 0 .  traMADol (ULTRAM) 50 MG tablet, Take 1 tablet (50 mg total) by mouth every 12 (twelve) hours., Disp: 60 tablet, Rfl: 0  EXAM:  VITALS per patient if applicable:  GENERAL: alert, oriented, appears well and in no acute distress  HEENT: atraumatic, conjunttiva clear, no obvious abnormalities on inspection of external nose and ears  NECK: normal movements of the head and neck  LUNGS: on inspection no signs of respiratory distress, breathing rate appears normal, no obvious gross SOB, gasping or wheezing  CV: no obvious cyanosis  MS: moves all visible extremities without noticeable abnormality  PSYCH/NEURO: pleasant and cooperative, no obvious depression or  anxiety, speech and thought processing grossly intact  ASSESSMENT AND PLAN:  Discussed the following assessment and plan:  Nasal congestion  Cough  -we discussed possible serious and likely etiologies, options for evaluation and workup, limitations of telemedicine visit vs in person visit, treatment, treatment risks and precautions. Pt prefers to treat via telemedicine empirically rather than in person at this moment.  Primary viral upper respiratory illness, seasonal allergies, COVID-19 versus other.  He opted for symptomatic care with nasal saline, humidifier, Tessalon prescription sent for cough.  Also discussed COVID-19 testing options.  Discussed treatment, potential complications, isolation and precautions. Work/School slipped offered: provided in patient instructions   Scheduled follow up with PCP offered: He agrees to schedule follow-up visit if needed and if positive COVID test Advised to seek prompt in person care if worsening,  new symptoms arise, or if is not improving with treatment.   I discussed the assessment and treatment plan with the patient. The patient was provided an opportunity to ask questions and all were answered. The patient agreed with the plan and demonstrated an understanding of the instructions.     Lucretia Kern, DO

## 2020-06-17 IMAGING — MR MR LUMBAR SPINE W/O CM
4 of 5 series · 18 of 48 positions shown · non-contrast
Comparison: 09/27/2015

CLINICAL DATA: Right hip and buttock pain extending down the right
leg starting about 2 weeks ago. Some developing weakness.

EXAM:
MRI LUMBAR SPINE WITHOUT CONTRAST
TECHNIQUE: Multiplanar, multisequence MR imaging of the lumbar spine was
performed. No intravenous contrast was administered.

[Series 3: T2 · sagittal · 4.0mm · 0.55mm/px · 6 of 13 slices shown (1 of 2)]
[im 1/13]
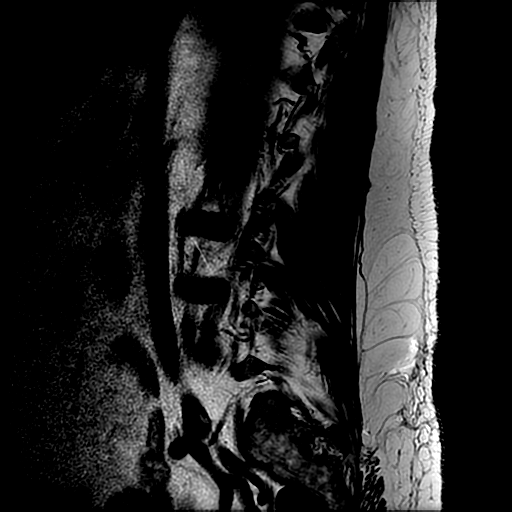
[im 3/13]
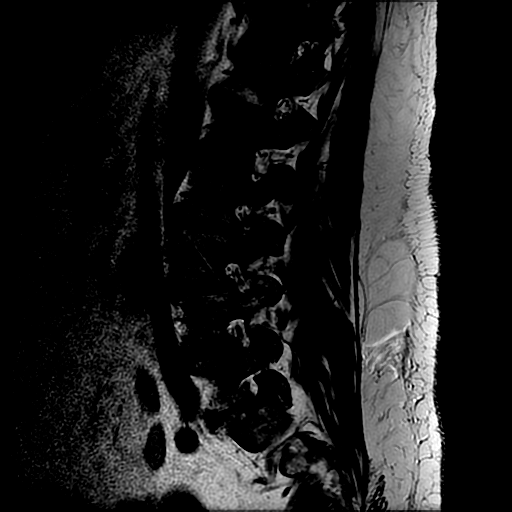
[im 5/13]
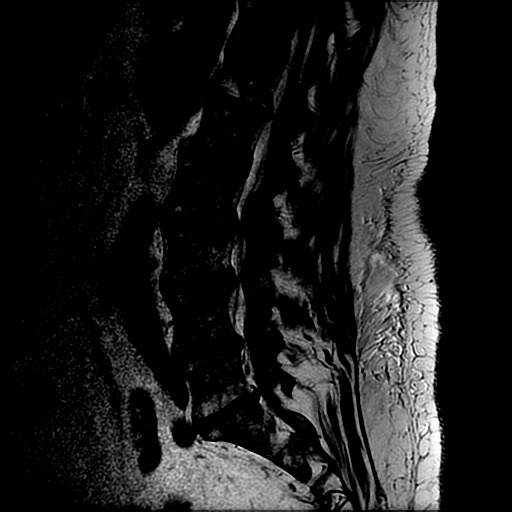
[im 8/13]
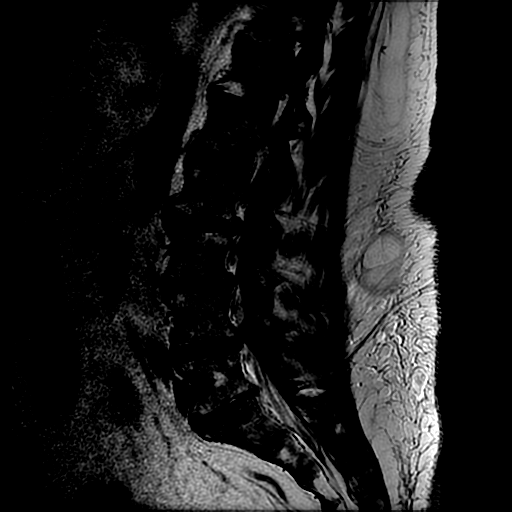
[im 10/13]
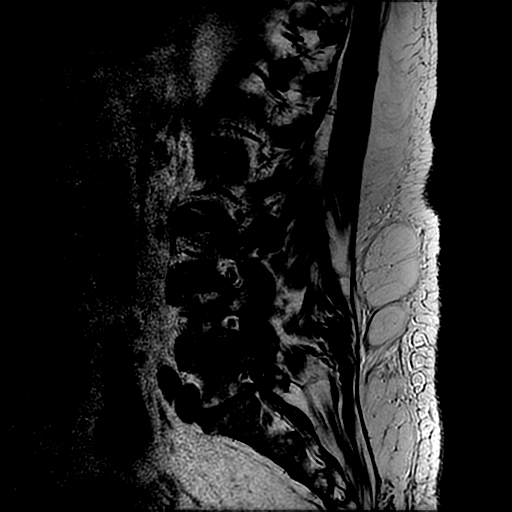
[im 13/13]
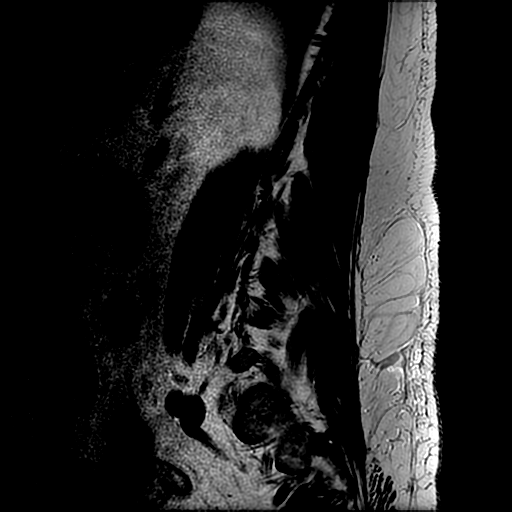

[Series 5: T1 · sagittal · 4.0mm · 0.55mm/px · 3 of 14 slices shown (1 of 2)]
[im 1/14]
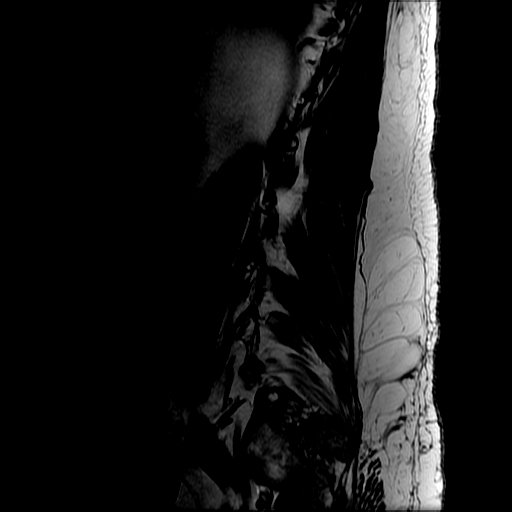
[im 7/14]
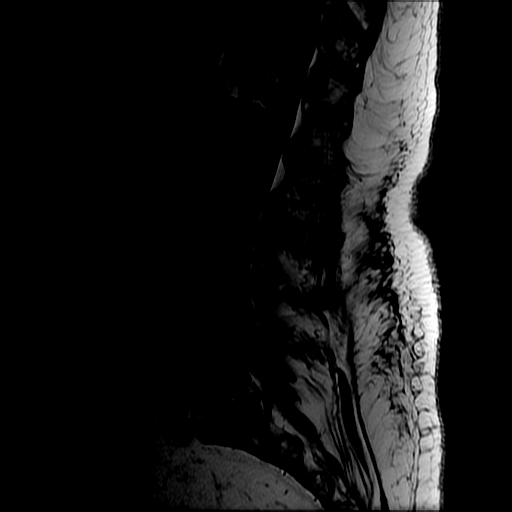
[im 14/14]
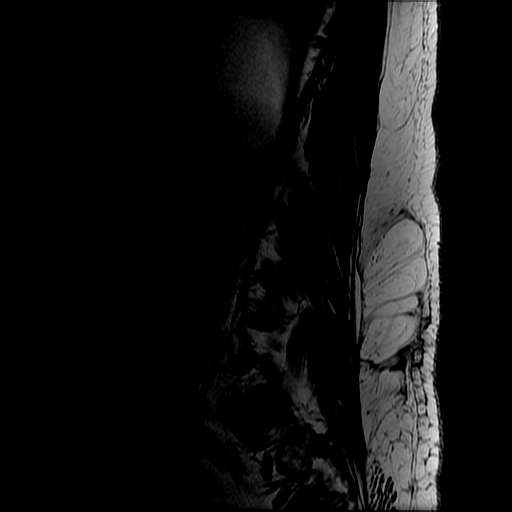

[Series 6: T2 · axial · 4.0mm · 0.39mm/px · z∈[-53,+126]mm · 6 of 43 slices shown (2 of 2)]
[im 3/43]
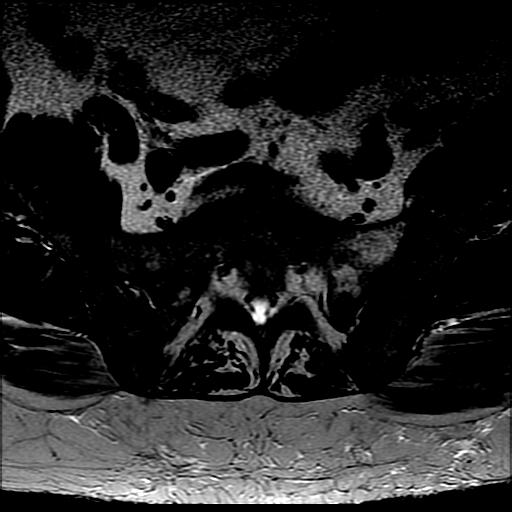
[im 6/43]
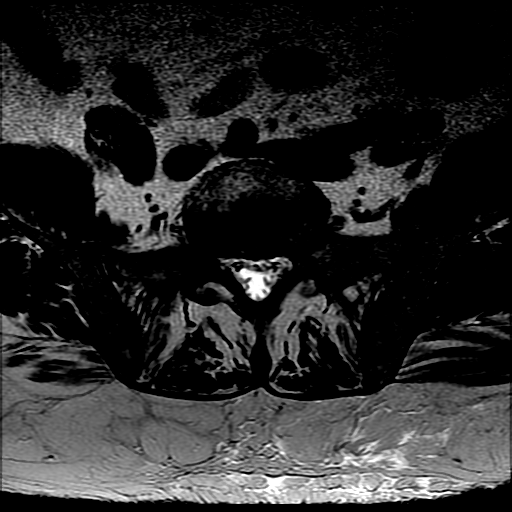
[im 9/43]
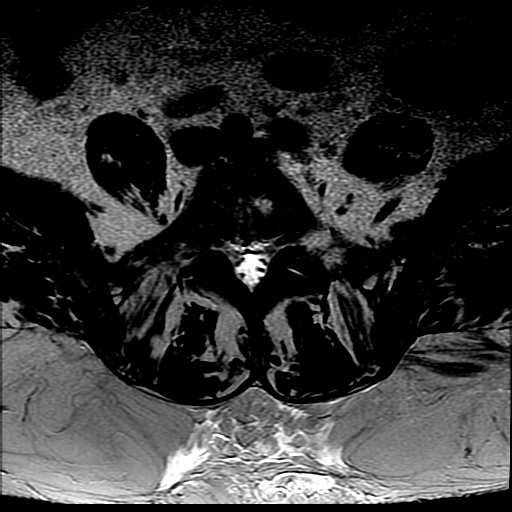
[im 15/43]
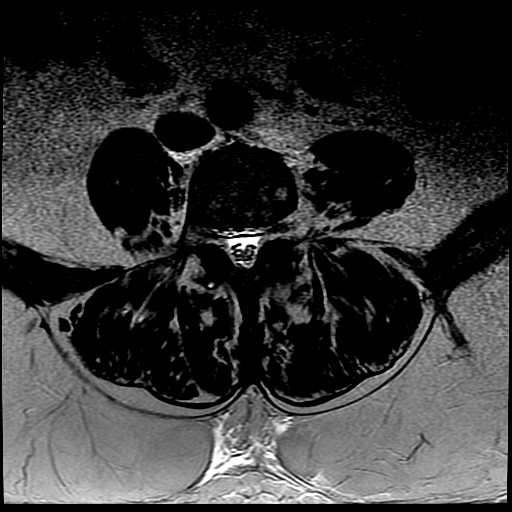
[im 23/43]
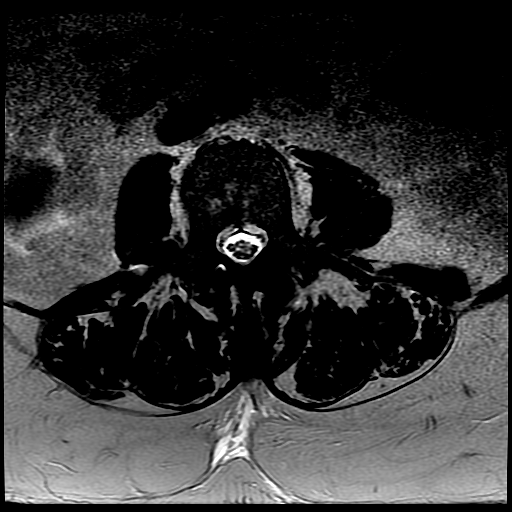
[im 37/43]
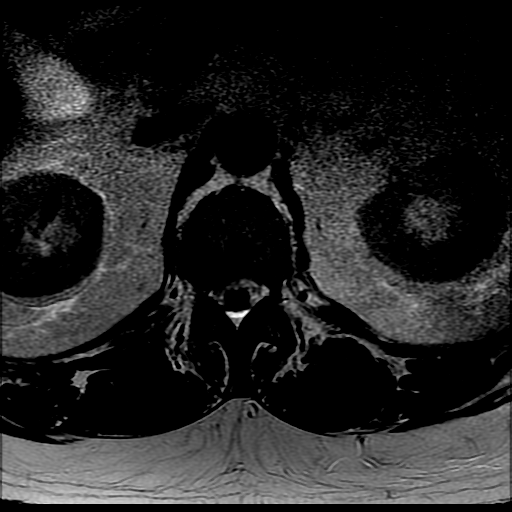

[Series 7: T1 · axial · 4.0mm · 0.39mm/px · z∈[-38,+126]mm · 3 of 43 slices shown (2 of 2)]
[im 6/43]
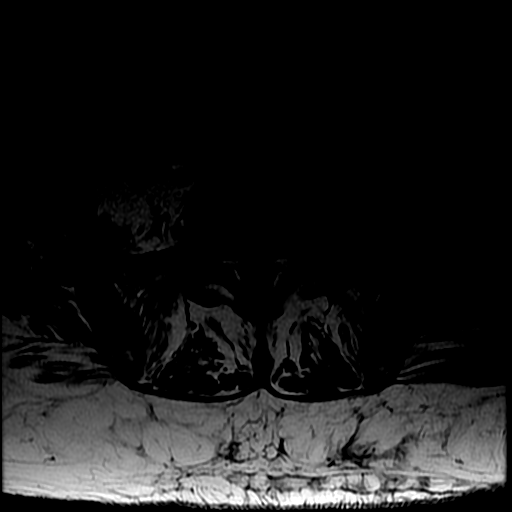
[im 23/43]
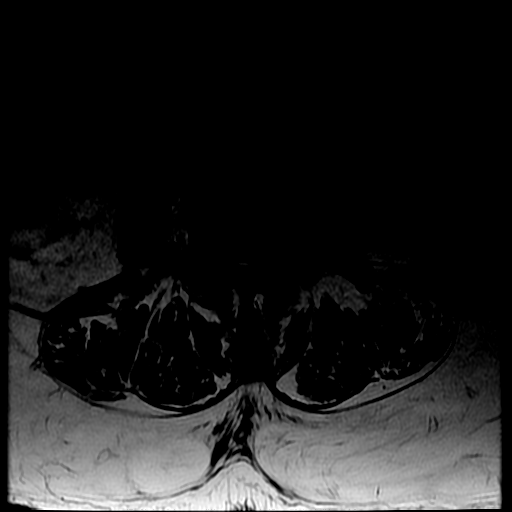
[im 37/43]
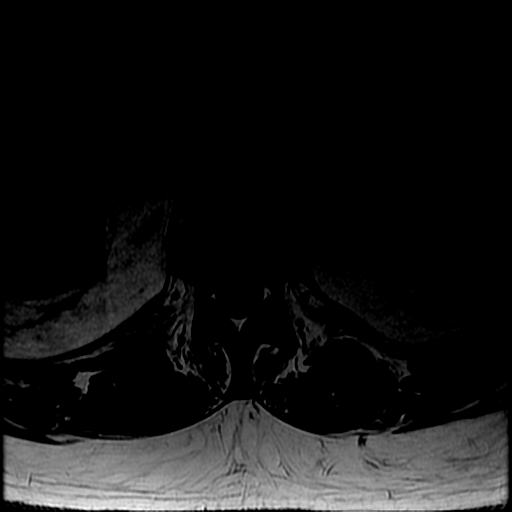

[18 of 48 positions shown; findings below may reference images not displayed]

FINDINGS: Segmentation: The lowest lumbar type non-rib-bearing vertebra is
labeled as L5.

Alignment:  3 mm grade 1 degenerative anterolisthesis at L3-4.

Vertebrae: Marrow heterogeneity is present. Although this can be
caused by marrow infiltrative processes, the most common causes
include anemia, smoking, obesity, or advancing age. Type 2
degenerative endplate findings at T12-L1 and L5-S1. Disc desiccation
at all levels between L2 and S1 with mild type 1 degenerative
endplate findings posteriorly at L2-3. No compression fracture is
identified. Congenitally short pedicles in the mid lumbar spine.

Conus medullaris and cauda equina: Conus extends to the L1-2 level.
Conus peers normal. The cauda equina appears slightly bunched at the
L2 level, possibly due to the central narrowing of the thecal sac
below this level. I do not see compelling findings of clumping of
the nerve roots to suggest arachnoiditis.

Paraspinal and other soft tissues: Unremarkable

Disc levels:

T12-L1: No impingement. Mild disc bulge slightly eccentric to the
right.

L1-2: Unremarkable.

L2-3: Moderate central narrowing of the thecal sac with mild
bilateral subarticular lateral recess stenosis and mild displacement
of the right L2 nerve in the lateral extraforaminal space due to
diffuse disc bulge, facet arthropathy, short pedicles, and a small
right inferior foraminal and lateral extraforaminal disc protrusion.
The findings have worsened compared to prior.

L3-4: Prominent central narrowing of the thecal sac with mild
bilateral subarticular lateral recess stenosis and borderline right
foraminal stenosis due to diffuse disc bulge, facet arthropathy, and
a right paracentral disc protrusion extending cephalad. The
cross-sectional area of the thecal sac at this level is 0.44 cm^2,
worsened compared to the 5122 exam.

L4-5: Prominent central narrowing of the thecal sac noted with mild
bilateral subarticular lateral recess stenosis and displacement of
the left L4 nerve in the lateral extraforaminal space due to left
lateral extraforaminal disc protrusion, disc bulge, and facet
arthropathy. Small bilateral synovial cysts are not in a position to
cause impingement.-the findings at this level have significantly
worsened.

L5-S1: Borderline bilateral foraminal stenosis due to facet
arthropathy, intervertebral spurring, and mild disc bulge.
IMPRESSION: 1. Progressive lumbar spondylosis and degenerative disc disease,
causing prominent impingement at L3-4 and L4-5, and moderate
impingement at L2-3 is noted above. The findings at all 3 levels
have worsened compared to prior.
2. Marrow heterogeneity is present. Although this can be caused by
marrow infiltrative processes, the most common causes include
anemia, smoking, obesity, or advancing age.

## 2020-07-02 ENCOUNTER — Other Ambulatory Visit: Payer: Self-pay | Admitting: Family Medicine

## 2020-07-02 ENCOUNTER — Other Ambulatory Visit: Payer: Self-pay | Admitting: Cardiology

## 2020-07-02 DIAGNOSIS — J302 Other seasonal allergic rhinitis: Secondary | ICD-10-CM

## 2020-07-11 ENCOUNTER — Ambulatory Visit: Payer: No Typology Code available for payment source | Attending: Family Medicine | Admitting: Audiology

## 2020-07-11 ENCOUNTER — Other Ambulatory Visit: Payer: Self-pay

## 2020-07-11 DIAGNOSIS — H903 Sensorineural hearing loss, bilateral: Secondary | ICD-10-CM | POA: Insufficient documentation

## 2020-07-12 NOTE — Procedures (Signed)
  Outpatient Audiology and [place], [place]  [id number] [phone]  AUDIOLOGICAL  EVALUATION  NAME: Todd Spears     DOB:   [birth year]      MRN: [id number]                                                                                     DATE: 07/12/2020     REFERENT: Vivi Barrack, MD STATUS: Outpatient DIAGNOSIS: Sensorineural hearing loss, bilateral    History: [name] was seen for an audiological evaluation. [name] reports a history of left sudden sensorineural hearing loss occurring 15+ years ago and reports very limited hearing from his left ear. He denies tinnitus, otalgia, aural fullness, and dizziness. He denies hearing concerns regarding his right ear. [name] reports a CROS aid was previously recommended to him for his hearing loss however he was not interested in pursuing hearing aids.   Evaluation:   Otoscopy showed non-occluding cerumen, bilaterally  Tympanometry results were consistent with normal middle ear pressure and normal tympanic membrane mobility, bilaterally.   Audiometric testing was completed using Conventional Audiometry techniques with insert earphones and TDH headphones. Test results are consistent with normal hearing sloping to a moderately-severe sensorineural hearing loss in the right ear and a moderate sloping to profound sensorineural hearing loss in the left ear. Speech Recognition Thresholds were obtained at 25 dB HL in the right ear and masked at 80 dB HL the left ear. Word Recognition Testing was completed using W-22 word lists and [place] scored 96% at 70 dB HL in the right ear and scored 32% at 100 dB HL masked in the left ear.   Results:   Test results are consistent with normal hearing sloping to a moderately-severe sensorineural hearing loss in the right ear and a moderate sloping to profound sensorineural hearing loss in the left ear. [name] will have communication difficulty in many different listening  environments and will benefit from the use of good communication strategies. He was given handouts on good communication strategies.   Recommendations: 1.   Return in 1-2 years to monitor hearing sensitivity.     Bari Mantis Audiologist, Au.D., CCC-A 07/12/2020  9:19 AM  Cc: Vivi Barrack, MD

## 2020-07-28 ENCOUNTER — Other Ambulatory Visit: Payer: Self-pay | Admitting: Family Medicine

## 2020-07-28 DIAGNOSIS — J302 Other seasonal allergic rhinitis: Secondary | ICD-10-CM

## 2020-08-22 ENCOUNTER — Ambulatory Visit: Payer: No Typology Code available for payment source | Admitting: Podiatry

## 2020-08-22 ENCOUNTER — Encounter: Payer: Self-pay | Admitting: Podiatry

## 2020-08-22 ENCOUNTER — Other Ambulatory Visit: Payer: Self-pay

## 2020-08-22 DIAGNOSIS — Q828 Other specified congenital malformations of skin: Secondary | ICD-10-CM

## 2020-08-22 DIAGNOSIS — N182 Chronic kidney disease, stage 2 (mild): Secondary | ICD-10-CM

## 2020-08-22 DIAGNOSIS — M216X9 Other acquired deformities of unspecified foot: Secondary | ICD-10-CM

## 2020-08-22 DIAGNOSIS — M79675 Pain in left toe(s): Secondary | ICD-10-CM

## 2020-08-22 DIAGNOSIS — B351 Tinea unguium: Secondary | ICD-10-CM | POA: Diagnosis not present

## 2020-08-22 DIAGNOSIS — M79674 Pain in right toe(s): Secondary | ICD-10-CM

## 2020-08-22 NOTE — Progress Notes (Signed)
This patient returns to my office for at risk foot care.  This patient requires this care by a professional since this patient will be at risk due to having chronic kidney disease.  Patient has painful callus on the outside bottom of both feet.  This patient is unable to cut nails himself since the patient cannot reach his nails.These nails are painful walking and wearing shoes. Patient has painful callus on the outside ball both feet.  Patient is unable to self treat.   This patient presents for at risk foot care today.  General Appearance  Alert, conversant and in no acute stress.  Vascular  Dorsalis pedis and posterior tibial  pulses are palpable  bilaterally.  Capillary return is within normal limits  bilaterally. Temperature is within normal limits  bilaterally.  Neurologic  Senn-Weinstein monofilament wire test within normal limits  bilaterally. Muscle power within normal limits bilaterally.  Nails Thick disfigured discolored nails with subungual debris  from hallux to fifth toes bilaterally. No evidence of bacterial infection or drainage bilaterally.  Orthopedic  No limitations of motion  feet .  No crepitus or effusions noted.  No bony pathology or digital deformities noted.  Skin  normotropic skin with no porokeratosis noted bilaterally.  No signs of infections or ulcers noted.  Porokeratosis sub 5th metatarsal  B/L.   Onychomycosis  Pain in right toes  Pain in left toes  Porokeratosis B/L.  Consent was obtained for treatment procedures.   Mechanical debridement of nails 1-5  bilaterally performed with a nail nipper.  Filed with dremel without incident. No infection or ulcer.  Debridement of porokeratosis  Sub 5th met  B/L with # 15 blade.   Return office visit     3 months                 Told patient to return for periodic foot care and evaluation due to potential at risk complications.   Lakeithia Rasor DPM  

## 2020-08-23 ENCOUNTER — Ambulatory Visit: Payer: No Typology Code available for payment source | Admitting: Podiatry

## 2020-09-01 ENCOUNTER — Other Ambulatory Visit: Payer: Self-pay | Admitting: Family Medicine

## 2020-09-01 DIAGNOSIS — J302 Other seasonal allergic rhinitis: Secondary | ICD-10-CM

## 2020-09-22 ENCOUNTER — Ambulatory Visit: Payer: No Typology Code available for payment source | Admitting: Family Medicine

## 2020-09-22 ENCOUNTER — Other Ambulatory Visit: Payer: Self-pay

## 2020-09-22 ENCOUNTER — Encounter: Payer: Self-pay | Admitting: Family Medicine

## 2020-09-22 VITALS — BP 148/64 | HR 73 | Temp 97.8°F | Wt 239.4 lb

## 2020-09-22 DIAGNOSIS — M1990 Unspecified osteoarthritis, unspecified site: Secondary | ICD-10-CM

## 2020-09-22 MED ORDER — METHYLPREDNISOLONE ACETATE 80 MG/ML IJ SUSP
80.0000 mg | Freq: Once | INTRAMUSCULAR | Status: AC
  Administered 2020-09-22: 80 mg via INTRA_ARTICULAR

## 2020-09-22 MED ORDER — METHYLPREDNISOLONE ACETATE 80 MG/ML IJ SUSP: 80.0000 mg | Freq: Once | INTRAMUSCULAR | Status: DC

## 2020-09-22 NOTE — Assessment & Plan Note (Addendum)
Not controlled.   No concerns for rotator cuff pathology based on his shoulder exam today.  His right upper arm and shoulder pain likely secondary to osteoarthritis.  Steroid injection was performed today.  See below procedure note.  He tolerated well.  If not improving would consider referral to PT and/or sports med.  And hand pain also secondary to osteoarthritis.  He should have some improvement with steroid injection today though told patient he may need referral to specialist for cortisone shots in the joints in his hand if needed.  He has tried voltaren gel in the past which has not helped.

## 2020-09-22 NOTE — Patient Instructions (Signed)
It was very nice to see you today!  You have arthritis.  Think this is causing your pain.  We give your cortisone shot today.  Please check in with me next week and let me know how this is going.  We may need to refer you to see a physical therapist or sports medicine doctor depending on your response.  You can continue the medications you have already been prescribed.  Take care, Dr Jerline Pain  PLEASE NOTE:  If you had any lab tests please let us know if you have not heard back within a few days. You may see your results on mychart before we have a chance to review them but we will give you a call once they are reviewed by [place]. If we ordered any referrals today, please let us know if you have not heard from their office within the next week.   Please try these tips to maintain a healthy lifestyle:   Eat at least 3 REAL meals and 1-2 snacks per day.  Aim for no more than 5 hours between eating.  If you eat breakfast, please do so within one hour of getting up.    Each meal should contain half fruits/vegetables, one quarter protein, and one quarter carbs (no bigger than a computer mouse)   Cut down on sweet beverages. This includes juice, soda, and sweet tea.     Drink at least 1 glass of water with each meal and aim for at least 8 glasses per day   Exercise at least 150 minutes every week.

## 2020-09-22 NOTE — Progress Notes (Signed)
   DEONE LEIFHEIT is a 75 y.o. male who presents today for an office visit.  Assessment/Plan:  Chronic Problems Addressed Today: Osteoarthritis Not controlled.   No concerns for rotator cuff pathology based on his shoulder exam today.  His right upper arm and shoulder pain likely secondary to osteoarthritis.  Steroid injection was performed today.  See below procedure note.  He tolerated well.  If not improving would consider referral to PT and/or sports med.  And hand pain also secondary to osteoarthritis.  He should have some improvement with steroid injection today though told patient he may need referral to specialist for cortisone shots in the joints in his hand if needed.  He has tried voltaren gel in the past which has not helped.     Subjective:  HPI:  Patient here with right arm and hand pain for the last 3 weeks.  Intermittent in nature. Worse when raising his arm up. Pain in upper arm and in his hand.  Has tried tramadol modest improvement.  No obvious injuries or precipitating events.  Also has some bilateral knee and leg pain.  Has a known history of osteoarthritis and spondylosis of the lumbar spine.  Has tried Voltaren gel without improvement.       Objective:  Physical Exam: BP (!) 148/64   Pulse 73   Temp 97.8 F (36.6 C) (Temporal)   Wt 239 lb 6.4 oz (108.6 kg)   SpO2 98%   BMI 37.50 kg/m   Gen: No acute distress, resting comfortably CV: Regular rate and rhythm with no murmurs appreciated Pulm: Normal work of breathing, clear to auscultation bilaterally with no crackles, wheezes, or rhonchi MSK:  - Arms: No deformities.  Supraspinatus testing intact without pain.  Negative Neer and Hawkins test.  Positive crossover test. - Hand: Joint hypertrophy noted.  Tenderness to palpation at base of thumb.  Pain is worsened by axial joint loading.  Also has pain at second and third MCP joints. Neuro: Grossly normal, moves all extremities Psych: Normal affect and thought  content  Shoulder Injection Procedure Note  Pre-operative Diagnosis: Shoulder Pain  Post-operative Diagnosis: same  Indications: Pain Relief   Anesthesia: Topical ethyl chloride  Procedure Details   Written consent was obtained for the procedure. The shoulder was prepped with iodine and the skin was anesthetized with topical ethyl choride. Using a 25 gauge needle the subacromial space was injected with 3 mL 1% lidocaine and 1 mL of 80cc/ml depomedrol under the posterior aspect of the acromion. The injection site was cleansed with topical isopropyl alcohol and a dressing was applied.  Complications:  None; patient tolerated the procedure well.      [name]. Jerline Pain, MD 09/22/2020 11:32 AM

## 2020-09-26 ENCOUNTER — Telehealth: Payer: Self-pay

## 2020-09-26 NOTE — Telephone Encounter (Signed)
Patient stated that he was to return call if the Cortizone shot he received has not improved his hands and it has not so he is requesting a referral to Dr.Beekman's office  At [facility] rheumatology  He states if we have any other ideas instead to call him Please advise Thank you

## 2020-09-26 NOTE — Telephone Encounter (Signed)
I am ok with referral to Dr Amil Amen though we can probably get him in to sports medicine faster.  Todd Spears. Jerline Pain, MD 09/26/2020 3:56 PM

## 2020-09-26 NOTE — Telephone Encounter (Signed)
See note

## 2020-09-27 ENCOUNTER — Other Ambulatory Visit: Payer: Self-pay | Admitting: *Deleted

## 2020-09-27 DIAGNOSIS — M1990 Unspecified osteoarthritis, unspecified site: Secondary | ICD-10-CM

## 2020-09-27 DIAGNOSIS — I350 Nonrheumatic aortic (valve) stenosis: Secondary | ICD-10-CM

## 2020-09-27 NOTE — Telephone Encounter (Signed)
Referral placed.

## 2020-10-23 ENCOUNTER — Encounter: Payer: Self-pay | Admitting: Cardiology

## 2020-10-23 ENCOUNTER — Telehealth: Payer: Self-pay | Admitting: Cardiology

## 2020-10-23 NOTE — Telephone Encounter (Signed)
Myoview letter in pt chart  - please disregard.

## 2020-10-25 ENCOUNTER — Telehealth: Payer: Self-pay | Admitting: Cardiology

## 2020-10-25 NOTE — Telephone Encounter (Signed)
Patient states he has been calling since the beginning of May trying to schedule his August follow up with Dr. Martinique. I informed him he is booked up through September and we do not have his October schedule out yet. He states he was told to call in May to schedule in August. He would like a call back from [place] to see if he can be worked in.

## 2020-10-25 NOTE — Telephone Encounter (Signed)
Returned call to patient who was calling into make a 12 month follow up with Dr. Martinique. Patient states he is not currently having any issues.   Made patient an appointment with Dr. Martinique on  8/22 at 2 pm. Advised patient to call back with any issues, questions, or concerns. Patient verbalized understanding.

## 2020-11-20 ENCOUNTER — Ambulatory Visit: Payer: No Typology Code available for payment source | Admitting: Podiatry

## 2020-11-21 ENCOUNTER — Ambulatory Visit: Payer: No Typology Code available for payment source | Admitting: Podiatry

## 2020-11-27 ENCOUNTER — Encounter: Payer: Self-pay | Admitting: Podiatry

## 2020-11-27 ENCOUNTER — Other Ambulatory Visit: Payer: Self-pay

## 2020-11-27 ENCOUNTER — Ambulatory Visit: Payer: No Typology Code available for payment source | Admitting: Podiatry

## 2020-11-27 DIAGNOSIS — B351 Tinea unguium: Secondary | ICD-10-CM

## 2020-11-27 DIAGNOSIS — M79674 Pain in right toe(s): Secondary | ICD-10-CM

## 2020-11-27 DIAGNOSIS — N182 Chronic kidney disease, stage 2 (mild): Secondary | ICD-10-CM | POA: Diagnosis not present

## 2020-11-27 DIAGNOSIS — M216X9 Other acquired deformities of unspecified foot: Secondary | ICD-10-CM | POA: Diagnosis not present

## 2020-11-27 DIAGNOSIS — M79675 Pain in left toe(s): Secondary | ICD-10-CM | POA: Diagnosis not present

## 2020-11-27 DIAGNOSIS — Q828 Other specified congenital malformations of skin: Secondary | ICD-10-CM | POA: Diagnosis not present

## 2020-11-27 NOTE — Progress Notes (Signed)
This patient returns to my office for at risk foot care.  This patient requires this care by a professional since this patient will be at risk due to having chronic kidney disease.  Patient has painful callus on the outside bottom of both feet.  This patient is unable to cut nails himself since the patient cannot reach his nails.These nails are painful walking and wearing shoes. Patient has painful callus on the outside ball both feet.  Patient is unable to self treat.   This patient presents for at risk foot care today.  General Appearance  Alert, conversant and in no acute stress.  Vascular  Dorsalis pedis and posterior tibial  pulses are palpable  bilaterally.  Capillary return is within normal limits  bilaterally. Temperature is within normal limits  bilaterally.  Neurologic  Senn-Weinstein monofilament wire test within normal limits  bilaterally. Muscle power within normal limits bilaterally.  Nails Thick disfigured discolored nails with subungual debris  from hallux to fifth toes bilaterally. No evidence of bacterial infection or drainage bilaterally.  Orthopedic  No limitations of motion  feet .  No crepitus or effusions noted.  No bony pathology or digital deformities noted.  Skin  normotropic skin with no porokeratosis noted bilaterally.  No signs of infections or ulcers noted.  Porokeratosis sub 5th metatarsal  B/L.  Porokeratosis sub 5th metabase right foot.  Onychomycosis  Pain in right toes  Pain in left toes  Porokeratosis B/L.  Consent was obtained for treatment procedures.   Mechanical debridement of nails 1-5  bilaterally performed with a nail nipper.  Filed with dremel without incident. No infection or ulcer.  Debridement of porokeratosis  Sub 5th met  B/L with # 15 blade.   Return office visit     3 months                 Told patient to return for periodic foot care and evaluation due to potential at risk complications.   Gardiner Barefoot DPM

## 2020-12-05 ENCOUNTER — Other Ambulatory Visit: Payer: Self-pay | Admitting: Family Medicine

## 2021-01-07 ENCOUNTER — Other Ambulatory Visit: Payer: Self-pay | Admitting: Family Medicine

## 2021-01-07 DIAGNOSIS — J302 Other seasonal allergic rhinitis: Secondary | ICD-10-CM

## 2021-01-08 ENCOUNTER — Other Ambulatory Visit: Payer: Self-pay | Admitting: Family Medicine

## 2021-01-19 NOTE — Progress Notes (Signed)
Todd Spears Date of Birth: 02/20/1946 Medical Record [id number]  History of Present Illness: Todd Spears is seen back today for follow up CAD. He is s/p 2 vessel PCI with Promus DES for 90% mid LAD and 95% mid RCA disease on 04/06/2012. His EF is normal at 55%. At that time he really had no significant anginal symptoms but had an abnormal stress test.  Myoview in Nov. 2014 showed a fixed inferior defect without ischemia. Last Myoview in Feb. 2017 was unchanged. Other issues include CKD stage 3, HTN, obesity, gout,,OA, ED, carotid bruit and asthma.   He was admitted in January 2017 with tremors and hyponatremia. Chlorthalidone was discontinued.   He had carotid dopplers showing 1-39% RICA and [id number] LICA stenoses. Echo done in March 2017 showed normal LV function and mild AS unchanged from 2013. He has been followed in our lipid clinic for hypercholesterolemia with prior intolerance to statins. He is now on pravastatin and Zetia.   On follow up today he is doing well. He denies any chest pain or SOB. No TIA or CVA symptoms. He is still working as a  Engineer, building services. BP at home is good. 138/68 this am.    Allergies as of 01/22/2021       Reactions   Prednisone Shortness Of Breath, Swelling   Crestor [rosuvastatin Calcium] Other (See Comments)   myalgia   Lyrica [pregabalin]    "swelling"        Medication List        Accurate as of January 22, 2021  2:25 PM. If you have any questions, ask your nurse or doctor.          albuterol 108 (90 Base) MCG/ACT inhaler Commonly known as: VENTOLIN HFA INHALE 2 PUFFS INTO THE LUNGS EVERY 6 (SIX) HOURS AS NEEDED. FOR SHORTNESS OF BREATH   albuterol 108 (90 Base) MCG/ACT inhaler Commonly known as: ProAir HFA INHALE 2 PUFFS INTO THE LUNGS EVERY 6 (SIX) HOURS AS NEEDED. FOR SHORTNESS OF BREATH   amLODipine 10 MG tablet Commonly known as: NORVASC TAKE 1 TABLET BY MOUTH EVERY DAY   aspirin 81 MG chewable tablet Chew 81 mg by mouth daily.    Azelastine HCl 137 MCG/SPRAY Soln INSTILL 2 SPRAYS INTO BOTH NOSTRILS 2 TIMES DAILY. USE IN EACH NOSTRIL AS DIRECTED   benzonatate 100 MG capsule Commonly known as: Tessalon Perles Take 1 capsule (100 mg total) by mouth 3 (three) times daily as needed.   cetirizine 10 MG tablet Commonly known as: ZYRTEC Take 10 mg by mouth every morning.   diclofenac 50 MG EC tablet Commonly known as: VOLTAREN Take 1 tablet (50 mg total) by mouth 2 (two) times daily.   diclofenac sodium 1 % Gel Commonly known as: VOLTAREN Apply 2 g topically 4 (four) times daily.   esomeprazole 40 MG capsule Commonly known as: NEXIUM TAKE 1 CAPSULE BY MOUTH DAILY BEFORE MEALS   ezetimibe 10 MG tablet Commonly known as: ZETIA TAKE 1 TABLET BY MOUTH EVERY DAY   hydrALAZINE 50 MG tablet Commonly known as: APRESOLINE Take 2 tablets (100 mg total) by mouth 3 (three) times daily. Take 2 tablets ( 100 mg ) three times a day   Integra Plus Caps TAKE 1 CAPSULE BY MOUTH EVERY DAY   lisinopril 40 MG tablet Commonly known as: ZESTRIL TAKE 1 TABLET BY MOUTH EVERY DAY   loratadine 10 MG tablet Commonly known as: CLARITIN Take 10 mg by mouth at bedtime.   Lumigan 0.01 %  Soln Generic drug: bimatoprost Place 1 drop into the left eye at bedtime.   metoprolol succinate 50 MG 24 hr tablet Commonly known as: TOPROL-XL TAKE 1 TABLET EVERY DAY WITH OR IMMEDIATELY FOLLOWING A MEAL   montelukast 10 MG tablet Commonly known as: SINGULAIR TAKE 1 TABLET BY MOUTH EVERYDAY AT BEDTIME   multivitamin with minerals Tabs tablet Take 1 tablet by mouth daily.   nitroGLYCERIN 0.4 MG SL tablet Commonly known as: Nitrostat Place 1 tablet (0.4 mg total) under the tongue every 5 (five) minutes as needed for chest pain (up to 3 doses. not within 24hrs of Viagra).   pravastatin 10 MG tablet Commonly known as: PRAVACHOL TAKE 1 TABLET EVERY OTHER DAY, ALTERNATING WITH 2 TABLETS.   tiZANidine 4 MG tablet Commonly known as:  Zanaflex Take 1 tablet (4 mg total) by mouth every 6 (six) hours as needed for muscle spasms.   traMADol 50 MG tablet Commonly known as: ULTRAM TAKE 1 TABLET (50 MG TOTAL) BY MOUTH EVERY 12 (TWELVE) HOURS.   VITAMIN B 12 PO Take 1 tablet by mouth daily.         Allergies  Allergen Reactions   Prednisone Shortness Of Breath and Swelling   Crestor [Rosuvastatin Calcium] Other (See Comments)    myalgia   Lyrica [Pregabalin]     "swelling"    Past Medical History:  Diagnosis Date   Allergic rhinitis    Anemia    Asthma    CAD (coronary artery disease)    a. s/p PTCA/stenting of the mid LAD and mid RCA 04/06/12   Carotid bruit    r   Colon polyp    Diverticulosis    ED (erectile dysfunction)    Fatty liver    GERD (gastroesophageal reflux disease)    Gout    Hyperlipidemia    Hypertension    Obesity    Osteoarthritis    Renal insufficiency    Restless leg syndrome     Past Surgical History:  Procedure Laterality Date   COLONOSCOPY     INGUINAL HERNIA REPAIR Right 02/27/2016   Procedure: HERNIA REPAIR INGUINAL ADULT;  Surgeon: Rolm Bookbinder, MD;  Location: [facility];  Service: General;  Laterality: Right;   INSERTION OF MESH Right 02/27/2016   Procedure: INSERTION OF MESH--IN AND OUT CATHETERIZATION PERFORMED AT END OF PROCEDURE TO DRAIN 400ML URINE.;  Surgeon: Rolm Bookbinder, MD;  Location: [facility];  Service: General;  Laterality: Right;   LYMPH NODE BIOPSY     right side of neck 1999 - benign   PERCUTANEOUS CORONARY STENT INTERVENTION (PCI-S) N/A 04/06/2012   Procedure: PERCUTANEOUS CORONARY STENT INTERVENTION (PCI-S);  Surgeon: Elenora Hawbaker M Martinique, MD; Promus DES for 90% mid LAD and 95% mid RCA disease      Social History   Tobacco Use  Smoking Status Former   Types: Cigarettes   Quit date: 06/04/1975   Years since quitting: 45.6  Smokeless Tobacco Never    Social History   Substance and Sexual Activity  Alcohol Use  No   Alcohol/week: 0.0 standard drinks    Family History  Problem Relation Age of Onset   Coronary artery disease Father        in his 77s   Heart disease Father    Heart attack Father    Hypertension Mother    Stroke Mother        Several    Stomach cancer Mother    Glaucoma Mother  Hypertension Paternal Grandmother    Hypertension Paternal Grandfather    Colon cancer Neg Hx    Esophageal cancer Neg Hx    Rectal cancer Neg Hx     Review of Systems: The review of systems is per the HPI.  All other systems were reviewed and are negative.  Physical Exam: BP (!) 160/60 (BP Location: Right Arm)   Pulse 68   Ht '5\' 7"'$  (1.702 m)   Wt 242 lb 9.6 oz (110 kg)   SpO2 98%   BMI 38.00 kg/m  GENERAL:  Well appearing, obese WM  HEENT:  PERRL, EOMI, sclera are clear. Oropharynx is clear. NECK:  No jugular venous distention, carotid upstroke brisk and symmetric, no bruits, no thyromegaly or adenopathy LUNGS:  Clear to auscultation bilaterally CHEST:  Unremarkable HEART:  RRR,  PMI not displaced or sustained,S1 and S2 within normal limits, no S3, no S4: no clicks, no rubs, gr 2/6 systolic murmur RUSB radiating to carotids.  ABD:  Soft, nontender. BS +, no masses or bruits. No hepatomegaly, no splenomegaly EXT:  2 + pulses throughout, no edema, no cyanosis no clubbing SKIN:  Warm and dry.  No rashes NEURO:  Alert and oriented x 3. Cranial nerves II through XII intact. PSYCH:  Cognitively intact      LABORATORY DATA: Lab Results  Component Value Date   WBC 4.9 04/13/2020   HGB 11.8 (L) 04/13/2020   HCT 34.7 (L) 04/13/2020   PLT 223 04/13/2020   GLUCOSE 103 (H) 04/13/2020   CHOL 145 04/13/2020   TRIG 99 04/13/2020   HDL 50 04/13/2020   LDLDIRECT 164.9 12/10/2010   LDLCALC 77 04/13/2020   ALT 27 04/13/2020   AST 26 04/13/2020   NA 137 04/13/2020   K 4.3 04/13/2020   CL 103 04/13/2020   CREATININE 1.13 04/13/2020   BUN 13 04/13/2020   CO2 26 04/13/2020   TSH 2.04  04/13/2020   PSA 0.77 04/13/2020   INR 1.0 04/03/2012   HGBA1C 5.7 (H) 04/13/2020   Ecg today shows NSR with first degree AV block. Rate 68. Otherwise normal. I have personally reviewed and interpreted this study.    Lexiscan myoview 07/28/15: Study Highlights     There was no ST segment deviation noted during stress. The left ventricular ejection fraction is normal (55-65%). Nuclear stress EF: 56%. Defect 1: There is a medium defect of moderate severity present in the basal inferoseptal, basal inferior, mid inferior and apical inferior location. In setting of normal LVF, this is consistent with diaphragmatic attenuation artifact. No ischemia noted. This is a low risk study.      Addendum by Sueanne Margarita, MD on Fri Jul 28, 2015  2:03 PM    There was no ST segment deviation noted during stress. The left ventricular ejection fraction is normal (55-65%). Nuclear stress EF: 56%. Defect 1: There is a medium defect of moderate severity present in the basal inferoseptal, basal inferior, mid inferior and apical inferior location. In setting of normal LVF, this is consistent with diaphragmatic attenuation artifact. No ischemia noted. This is a low risk study.     Echo: 08/25/15: Study Conclusions   - Left ventricle: The cavity size was normal. Wall thickness was   normal. Systolic function was normal. The estimated ejection   fraction was in the range of 60% to 65%. Wall motion was normal;   there were no regional wall motion abnormalities. - Aortic valve: Moderately calcified annulus. Moderately calcified   leaflets.  There was mild stenosis. - Mitral valve: There was mild regurgitation. - Left atrium: The atrium was mildly dilated  Carotid dopplers 08/12/17: bilateral 40-59% ICA stenosis.   Assessment / Plan:  1. CAD - status post 2 vessel PCI with DES in November 2013- doing well.  Myoview study in February 2017 was low risk with an area of inferior scar and no ischemia. Unchanged  from 2014.he remains asymptomatic.  We will continue medical therapy and risk factor modification.   2. HTN - blood pressure is elevated today but he reports good BP control at home.   He is on multiple meds. Will monitor on current therapy. Encourage weight loss and increased aerobic activity.   3. Hyponatremia secondary to chlorthalidone.  4. CKD stage 3a.   5. Hyperlipidemia. Intolerant of statins at higher doses. Continue with  dietary modification and weight loss. Continue pravastatin. Excellent response to addition of Zetia. Last LDL 77.   6. Carotid arterial disease with moderate 123456 LICA stenosis, 123456 RICA stenosis. Asymptomatic. Dopplers in October 2021 unchanged. Plan to repeat this fall.   7. History of mild Aortic stenosis.

## 2021-01-22 ENCOUNTER — Ambulatory Visit: Payer: No Typology Code available for payment source | Admitting: Cardiology

## 2021-01-22 ENCOUNTER — Other Ambulatory Visit: Payer: Self-pay

## 2021-01-22 ENCOUNTER — Encounter: Payer: Self-pay | Admitting: Cardiology

## 2021-01-22 VITALS — BP 160/60 | HR 68 | Ht 67.0 in | Wt 242.6 lb

## 2021-01-22 DIAGNOSIS — I2510 Atherosclerotic heart disease of native coronary artery without angina pectoris: Secondary | ICD-10-CM | POA: Diagnosis not present

## 2021-01-22 DIAGNOSIS — I350 Nonrheumatic aortic (valve) stenosis: Secondary | ICD-10-CM | POA: Diagnosis not present

## 2021-01-22 DIAGNOSIS — I6523 Occlusion and stenosis of bilateral carotid arteries: Secondary | ICD-10-CM

## 2021-01-22 DIAGNOSIS — I10 Essential (primary) hypertension: Secondary | ICD-10-CM

## 2021-01-22 DIAGNOSIS — E7800 Pure hypercholesterolemia, unspecified: Secondary | ICD-10-CM

## 2021-02-06 ENCOUNTER — Other Ambulatory Visit: Payer: Self-pay | Admitting: Cardiology

## 2021-02-06 ENCOUNTER — Other Ambulatory Visit: Payer: Self-pay | Admitting: Family Medicine

## 2021-02-11 ENCOUNTER — Other Ambulatory Visit: Payer: Self-pay | Admitting: Family Medicine

## 2021-02-27 ENCOUNTER — Encounter: Payer: Self-pay | Admitting: Podiatry

## 2021-02-27 ENCOUNTER — Ambulatory Visit: Payer: No Typology Code available for payment source | Admitting: Podiatry

## 2021-02-27 ENCOUNTER — Other Ambulatory Visit: Payer: Self-pay

## 2021-02-27 DIAGNOSIS — M79674 Pain in right toe(s): Secondary | ICD-10-CM | POA: Diagnosis not present

## 2021-02-27 DIAGNOSIS — M216X9 Other acquired deformities of unspecified foot: Secondary | ICD-10-CM

## 2021-02-27 DIAGNOSIS — B351 Tinea unguium: Secondary | ICD-10-CM

## 2021-02-27 DIAGNOSIS — M79675 Pain in left toe(s): Secondary | ICD-10-CM

## 2021-02-27 DIAGNOSIS — N182 Chronic kidney disease, stage 2 (mild): Secondary | ICD-10-CM

## 2021-02-27 DIAGNOSIS — Q828 Other specified congenital malformations of skin: Secondary | ICD-10-CM

## 2021-02-27 NOTE — Progress Notes (Signed)
This patient returns to my office for at risk foot care.  This patient requires this care by a professional since this patient will be at risk due to having chronic kidney disease.  Patient has painful callus on the outside bottom of both feet.  This patient is unable to cut nails himself since the patient cannot reach his nails.These nails are painful walking and wearing shoes. Patient has painful callus on the outside ball both feet.  Patient is unable to self treat.   This patient presents for at risk foot care today.  General Appearance  Alert, conversant and in no acute stress.  Vascular  Dorsalis pedis and posterior tibial  pulses are palpable  bilaterally.  Capillary return is within normal limits  bilaterally. Temperature is within normal limits  bilaterally.  Neurologic  Senn-Weinstein monofilament wire test within normal limits  bilaterally. Muscle power within normal limits bilaterally.  Nails Thick disfigured discolored nails with subungual debris  from hallux to fifth toes bilaterally. No evidence of bacterial infection or drainage bilaterally.  Orthopedic  No limitations of motion  feet .  No crepitus or effusions noted.  No bony pathology or digital deformities noted.  Skin  normotropic skin with no porokeratosis noted bilaterally.  No signs of infections or ulcers noted.  Porokeratosis sub 5th metatarsal  B/L.  Porokeratosis sub 5th metabase right foot.  Onychomycosis  Pain in right toes  Pain in left toes  Porokeratosis B/L.  Consent was obtained for treatment procedures.   Mechanical debridement of nails 1-5  bilaterally performed with a nail nipper.  Filed with dremel without incident. No infection or ulcer.  Debridement of porokeratosis  Sub 5th met  B/L with # 15 blade.   Return office visit     3 months                 Told patient to return for periodic foot care and evaluation due to potential at risk complications.   Gardiner Barefoot DPM

## 2021-03-01 ENCOUNTER — Other Ambulatory Visit: Payer: Self-pay | Admitting: Family Medicine

## 2021-03-01 MED ORDER — ALBUTEROL SULFATE HFA 108 (90 BASE) MCG/ACT IN AERS: INHALATION_SPRAY | RESPIRATORY_TRACT | 3 refills | Status: DC

## 2021-03-01 NOTE — Addendum Note (Signed)
Addended by: Marian Sorrow on: 03/01/2021 11:49 AM   Modules accepted: Orders

## 2021-03-27 ENCOUNTER — Other Ambulatory Visit (HOSPITAL_COMMUNITY): Payer: Self-pay | Admitting: Cardiology

## 2021-03-27 ENCOUNTER — Other Ambulatory Visit: Payer: Self-pay

## 2021-03-27 ENCOUNTER — Ambulatory Visit (HOSPITAL_COMMUNITY)
Admission: RE | Admit: 2021-03-27 | Discharge: 2021-03-27 | Disposition: A | Discharge status: Home / Self Care | Payer: Medicare HMO | Source: Ambulatory Visit | Attending: Cardiovascular Disease | Admitting: Cardiovascular Disease

## 2021-03-27 DIAGNOSIS — I6523 Occlusion and stenosis of bilateral carotid arteries: Secondary | ICD-10-CM

## 2021-03-29 ENCOUNTER — Other Ambulatory Visit: Payer: Self-pay

## 2021-03-29 DIAGNOSIS — I779 Disorder of arteries and arterioles, unspecified: Secondary | ICD-10-CM

## 2021-04-10 ENCOUNTER — Other Ambulatory Visit: Payer: Self-pay | Admitting: *Deleted

## 2021-04-16 ENCOUNTER — Telehealth: Payer: Self-pay

## 2021-04-16 DIAGNOSIS — H401121 Primary open-angle glaucoma, left eye, mild stage: Secondary | ICD-10-CM | POA: Diagnosis not present

## 2021-04-16 NOTE — Telephone Encounter (Signed)
Patient called stating he received a call from our clinic. Left voicemail to inform patient that it was an automated call to remind him of his appointment in our clinic on 04/19/21.

## 2021-04-17 ENCOUNTER — Ambulatory Visit (INDEPENDENT_AMBULATORY_CARE_PROVIDER_SITE_OTHER): Payer: Medicare HMO | Admitting: Family Medicine

## 2021-04-17 ENCOUNTER — Other Ambulatory Visit: Payer: Self-pay

## 2021-04-17 VITALS — BP 161/66 | HR 72 | Temp 98.5°F | Ht 67.0 in | Wt 243.0 lb

## 2021-04-17 DIAGNOSIS — Z125 Encounter for screening for malignant neoplasm of prostate: Secondary | ICD-10-CM

## 2021-04-17 DIAGNOSIS — E7800 Pure hypercholesterolemia, unspecified: Secondary | ICD-10-CM

## 2021-04-17 DIAGNOSIS — Z6838 Body mass index (BMI) 38.0-38.9, adult: Secondary | ICD-10-CM

## 2021-04-17 DIAGNOSIS — N182 Chronic kidney disease, stage 2 (mild): Secondary | ICD-10-CM

## 2021-04-17 DIAGNOSIS — R351 Nocturia: Secondary | ICD-10-CM | POA: Diagnosis not present

## 2021-04-17 DIAGNOSIS — R739 Hyperglycemia, unspecified: Secondary | ICD-10-CM | POA: Diagnosis not present

## 2021-04-17 DIAGNOSIS — Z0001 Encounter for general adult medical examination with abnormal findings: Secondary | ICD-10-CM | POA: Diagnosis not present

## 2021-04-17 DIAGNOSIS — I779 Disorder of arteries and arterioles, unspecified: Secondary | ICD-10-CM

## 2021-04-17 DIAGNOSIS — M109 Gout, unspecified: Secondary | ICD-10-CM

## 2021-04-17 DIAGNOSIS — I10 Essential (primary) hypertension: Secondary | ICD-10-CM | POA: Diagnosis not present

## 2021-04-17 LAB — COMPREHENSIVE METABOLIC PANEL
ALT: 33 U/L (ref 0–53)
AST: 29 U/L (ref 0–37)
Albumin: 4.2 g/dL (ref 3.5–5.2)
Alkaline Phosphatase: 44 U/L (ref 39–117)
BUN: 17 mg/dL (ref 6–23)
CO2: 29 mEq/L (ref 19–32)
Calcium: 9.3 mg/dL (ref 8.4–10.5)
Chloride: 99 mEq/L (ref 96–112)
Creatinine, Ser: 1.26 mg/dL (ref 0.40–1.50)
GFR: 55.66 mL/min — ABNORMAL LOW (ref >60.00)
Glucose, Bld: 120 mg/dL — ABNORMAL HIGH (ref 70–99)
Potassium: 4.9 mEq/L (ref 3.5–5.1)
Sodium: 135 mEq/L (ref 135–145)
Total Bilirubin: 0.8 mg/dL (ref 0.2–1.2)
Total Protein: 6.9 g/dL (ref 6.0–8.3)

## 2021-04-17 LAB — LIPID PANEL
Cholesterol: 148 mg/dL (ref 0–200)
HDL: 46.7 mg/dL (ref >39.00)
LDL Cholesterol: 72 mg/dL (ref 0–99)
NonHDL: 101.75
Total CHOL/HDL Ratio: 3
Triglycerides: 147 mg/dL (ref 0.0–149.0)
VLDL: 29.4 mg/dL (ref 0.0–40.0)

## 2021-04-17 LAB — CBC
HCT: 36.6 % — ABNORMAL LOW (ref 39.0–52.0)
Hemoglobin: 12.4 g/dL — ABNORMAL LOW (ref 13.0–17.0)
MCHC: 33.9 g/dL (ref 30.0–36.0)
MCV: 92.9 fl (ref 78.0–100.0)
Platelets: 223 10*3/uL (ref 150.0–400.0)
RBC: 3.95 Mil/uL — ABNORMAL LOW (ref 4.22–5.81)
RDW: 13.2 % (ref 11.5–15.5)
WBC: 5.9 10*3/uL (ref 4.0–10.5)

## 2021-04-17 LAB — TSH: TSH: 3.03 u[IU]/mL (ref 0.35–5.50)

## 2021-04-17 LAB — HEMOGLOBIN A1C: Hgb A1c MFr Bld: 6.2 % (ref 4.6–6.5)

## 2021-04-17 LAB — PSA: PSA: 0.67 ng/mL (ref 0.10–4.00)

## 2021-04-17 MED ORDER — ESOMEPRAZOLE MAGNESIUM 40 MG PO CPDR: DELAYED_RELEASE_CAPSULE | ORAL | 2 refills | Status: DC

## 2021-04-17 MED ORDER — EZETIMIBE 10 MG PO TABS: 10.0000 mg | ORAL_TABLET | Freq: Every day | ORAL | 2 refills | Status: DC

## 2021-04-17 NOTE — Assessment & Plan Note (Signed)
Continue aspirin and statin. 

## 2021-04-17 NOTE — Assessment & Plan Note (Signed)
Check lipids.  Continue pravastatin 10 mg daily.

## 2021-04-17 NOTE — Assessment & Plan Note (Signed)
Check CMET. 

## 2021-04-17 NOTE — Assessment & Plan Note (Signed)
Check A1c. 

## 2021-04-17 NOTE — Progress Notes (Signed)
Chief Complaint:  Todd Spears is a 75 y.o. male who presents today for his annual comprehensive physical exam.    Assessment/Plan:  Chronic Problems Addressed Today: Essential hypertension Above goal.  Usually well controlled at home.  Continue Norvasc 10 mg daily, lisinopril 40 mg daily, metoprolol succinate 50 mg daily, and hydralazine 100 mg 3 times daily.  Check labs today.  Continue home monitoring goal 150/90 or lower per JNC 8.  Hyperlipidemia Check lipids.  Continue pravastatin 10 mg daily.  Carotid arterial disease (HCC) Continue aspirin and statin.  CKD (chronic kidney disease) stage 2, GFR 60-89 ml/min Check CMET.   Hyperglycemia Check A1c.   Body mass index is 38.06 kg/m. / Obese  BMI Metric Follow Up - 04/17/21 0858       BMI Metric Follow Up-Please document annually   BMI Metric Follow Up Education provided             Preventative Healthcare: Will get blood work done today. UTD on flu and Covid vaccine. Discussed shingle vaccine. Due for colonoscopy this year.  Patient Counseling(The following topics were reviewed and/or handout was given):  -Nutrition: Stressed importance of moderation in sodium/caffeine intake, saturated fat and cholesterol, caloric balance, sufficient intake of fresh fruits, vegetables, and fiber.  -Stressed the importance of regular exercise.   -Substance Abuse: Discussed cessation/primary prevention of tobacco, alcohol, or other drug use; driving or other dangerous activities under the influence; availability of treatment for abuse.   -Injury prevention: Discussed safety belts, safety helmets, smoke detector, smoking near bedding or upholstery.   -Sexuality: Discussed sexually transmitted diseases, partner selection, use of condoms, avoidance of unintended pregnancy and contraceptive alternatives.   -Dental health: Discussed importance of regular tooth brushing, flossing, and dental visits.  -Health maintenance and immunizations  reviewed. Please refer to Health maintenance section.  Return to care in 1 year for next preventative visit.     Subjective:  HPI:  He has no acute complaints today.   His blood pressure at office was elevated today. He admit checking his blood pressure at home. He notes it is usually around 140/60 or 160/80. He is currently on Norvasc 10 mg daily, lisinopril 40 mg daily and hydralazine 50 mg. He is tolerating his medication well with no side effects.   Lifestyle Diet: Balanced: plenty of fruits and vegetables. Exercise: Busy with work.  Depression screen PHQ 2/9 04/17/2021  Decreased Interest 0  Down, Depressed, Hopeless 0  PHQ - 2 Score 0  Some recent data might be hidden    Health Maintenance Due  Topic Date Due   Zoster Vaccines- Shingrix (1 of 2) Never done   COVID-19 Vaccine (4 - Booster for Moderna series) 05/30/2020     ROS: Per HPI, otherwise a complete review of systems was negative.   PMH:  The following were reviewed and entered/updated in epic: Past Medical History:  Diagnosis Date   Allergic rhinitis    Anemia    Asthma    CAD (coronary artery disease)    a. s/p PTCA/stenting of the mid LAD and mid RCA 04/06/12   Carotid bruit    r   Colon polyp    Diverticulosis    ED (erectile dysfunction)    Fatty liver    GERD (gastroesophageal reflux disease)    Gout    Hyperlipidemia    Hypertension    Obesity    Osteoarthritis    Renal insufficiency    Restless leg syndrome  Patient Active Problem List   Diagnosis Date Noted   Hyperglycemia 04/13/2020   Pain due to onychomycosis of toenails of both feet 11/18/2018   Porokeratosis 11/18/2018   Gout 06/18/2018   Osteoarthritis 06/18/2018   Carotid arterial disease ([facility]) 08/10/2015   Aortic stenosis, mild 08/10/2015   CKD (chronic kidney disease) stage 2, GFR 60-89 ml/min 07/03/2015   CAD s/p PCI 2013    Hyperlipidemia 11/27/2006   Essential hypertension 11/27/2006   Allergic rhinitis 11/27/2006    GERD 11/27/2006   Past Surgical History:  Procedure Laterality Date   COLONOSCOPY     INGUINAL HERNIA REPAIR Right 02/27/2016   Procedure: HERNIA REPAIR INGUINAL ADULT;  Surgeon: Rolm Bookbinder, MD;  Location: [facility];  Service: General;  Laterality: Right;   INSERTION OF MESH Right 02/27/2016   Procedure: INSERTION OF MESH--IN AND OUT CATHETERIZATION PERFORMED AT END OF PROCEDURE TO DRAIN 400ML URINE.;  Surgeon: Rolm Bookbinder, MD;  Location: [facility];  Service: General;  Laterality: Right;   LYMPH NODE BIOPSY     right side of neck 1999 - benign   PERCUTANEOUS CORONARY STENT INTERVENTION (PCI-S) N/A 04/06/2012   Procedure: PERCUTANEOUS CORONARY STENT INTERVENTION (PCI-S);  Surgeon: Peter M Martinique, MD; Promus DES for 90% mid LAD and 95% mid RCA disease      Family History  Problem Relation Age of Onset   Coronary artery disease Father        in his 3s   Heart disease Father    Heart attack Father    Hypertension Mother    Stroke Mother        Several    Stomach cancer Mother    Glaucoma Mother    Hypertension Paternal Grandmother    Hypertension Paternal Grandfather    Colon cancer Neg Hx    Esophageal cancer Neg Hx    Rectal cancer Neg Hx     Medications- reviewed and updated Current Outpatient Medications  Medication Sig Dispense Refill   albuterol (VENTOLIN HFA) 108 (90 Base) MCG/ACT inhaler INHALE 2 PUFFS INTO LUNGS EVERY 6 HOURS AS NEEDED FOR SHORTNESS OF BREATH 8.5 each 3   amLODipine (NORVASC) 10 MG tablet TAKE 1 TABLET BY MOUTH EVERY DAY 90 tablet 2   aspirin 81 MG chewable tablet Chew 81 mg by mouth daily.     Azelastine HCl 137 MCG/SPRAY SOLN INSTILL 2 SPRAYS INTO BOTH NOSTRILS 2 TIMES DAILY. USE IN EACH NOSTRIL AS DIRECTED 30 mL 2   cetirizine (ZYRTEC) 10 MG tablet Take 10 mg by mouth every morning.     Cyanocobalamin (VITAMIN B 12 PO) Take 1 tablet by mouth daily.      diclofenac (VOLTAREN) 50 MG EC tablet Take 1 tablet  (50 mg total) by mouth 2 (two) times daily. 30 tablet 0   diclofenac sodium (VOLTAREN) 1 % GEL Apply 2 g topically 4 (four) times daily. 100 g 3   esomeprazole (NEXIUM) 40 MG capsule TAKE 1 CAPSULE BY MOUTH DAILY BEFORE MEALS 90 capsule 2   ezetimibe (ZETIA) 10 MG tablet Take 1 tablet (10 mg total) by mouth daily. 90 tablet 2   FeFum-FePoly-FA-B Cmp-C-Biot (INTEGRA PLUS) CAPS TAKE 1 CAPSULE BY MOUTH EVERY DAY 90 capsule 3   hydrALAZINE (APRESOLINE) 50 MG tablet TAKE 2 TABLETS BY MOUTH 3 TIMES A DAY 540 tablet 3   lisinopril (ZESTRIL) 40 MG tablet TAKE 1 TABLET BY MOUTH EVERY DAY 90 tablet 1   loratadine (CLARITIN) 10 MG tablet Take  10 mg by mouth at bedtime.      LUMIGAN 0.01 % SOLN Place 1 drop into the left eye at bedtime.     metoprolol succinate (TOPROL-XL) 50 MG 24 hr tablet TAKE 1 TABLET EVERY DAY WITH OR IMMEDIATELY FOLLOWING A MEAL 90 tablet 2   montelukast (SINGULAIR) 10 MG tablet TAKE 1 TABLET BY MOUTH EVERYDAY AT BEDTIME 90 tablet 3   Multiple Vitamin (MULTIVITAMIN WITH MINERALS) TABS tablet Take 1 tablet by mouth daily.     nitroGLYCERIN (NITROSTAT) 0.4 MG SL tablet Place 1 tablet (0.4 mg total) under the tongue every 5 (five) minutes as needed for chest pain (up to 3 doses. not within 24hrs of Viagra). 25 tablet 6   pravastatin (PRAVACHOL) 10 MG tablet TAKE 1 TABLET EVERY OTHER DAY, ALTERNATING WITH 2 TABLETS. 135 tablet 1   traMADol (ULTRAM) 50 MG tablet TAKE 1 TABLET (50 MG TOTAL) BY MOUTH EVERY 12 (TWELVE) HOURS. 60 tablet 5   No current facility-administered medications for this visit.    Allergies-reviewed and updated Allergies  Allergen Reactions   Prednisone Shortness Of Breath and Swelling   Crestor [Rosuvastatin Calcium] Other (See Comments)    myalgia   Lyrica [Pregabalin]     "swelling"    Social History   Socioeconomic History   Marital status: Married    Spouse name: Not on file   Number of children: 1   Years of education: Not on file   Highest education  level: Not on file  Occupational History    Employer: [employer]  Tobacco Use   Smoking status: Former    Types: Cigarettes    Quit date: 06/04/1975    Years since quitting: 45.9   Smokeless tobacco: Never  Vaping Use   Vaping Use: Never used  Substance and Sexual Activity   Alcohol use: No    Alcohol/week: 0.0 standard drinks   Drug use: No   Sexual activity: Not Currently  Other Topics Concern   Not on file  Social History Narrative   Works as a Engineer, building services    Married   One son ( lives in [place])    Regular exercise- no   Social Determinants of Health   Financial Resource Strain: Not on file  Food Insecurity: Not on file  Transportation Needs: Not on file  Physical Activity: Not on file  Stress: Not on file  Social Connections: Not on file        Objective:  Physical Exam: BP (!) 161/66   Pulse 72   Temp 98.5 F (36.9 C) (Temporal)   Ht 5\' 7"  (1.702 m)   Wt 243 lb (110.2 kg)   SpO2 99%   BMI 38.06 kg/m   Body mass index is 38.06 kg/m. Wt Readings from Last 3 Encounters:  04/17/21 243 lb (110.2 kg)  01/22/21 242 lb 9.6 oz (110 kg)  09/22/20 239 lb 6.4 oz (108.6 kg)   Gen: NAD, resting comfortably HEENT: TMs normal bilaterally. OP clear. No thyromegaly noted.  CV: RRR with no murmurs appreciated Pulm: NWOB, CTAB with no crackles, wheezes, or rhonchi GI: Normal bowel sounds present. Soft, Nontender, Nondistended. MSK: no edema, cyanosis, or clubbing noted Skin: warm, dry Neuro: CN2-12 grossly intact. Strength 5/5 in upper and lower extremities. Reflexes symmetric and intact bilaterally.  Psych: Normal affect and thought content      I,Savera Zaman,acting as a scribe for Dimas Chyle, MD.,have documented all relevant documentation on the behalf of Dimas Chyle, MD,as directed  by  Dimas Chyle, MD while in the presence of Dimas Chyle, MD.   I, Dimas Chyle, MD, have reviewed all documentation for this visit. The documentation on  04/17/21 for the exam, diagnosis, procedures, and orders are all accurate and complete.  [name]. Jerline Pain, MD 04/17/2021 9:03 AM

## 2021-04-17 NOTE — Assessment & Plan Note (Signed)
Above goal.  Usually well controlled at home.  Continue Norvasc 10 mg daily, lisinopril 40 mg daily, metoprolol succinate 50 mg daily, and hydralazine 100 mg 3 times daily.  Check labs today.  Continue home monitoring goal 150/90 or lower per JNC 8.

## 2021-04-17 NOTE — Patient Instructions (Signed)
It was very nice to see you today!  We will check blood work today.  Keep an eye on your  blood pressure and let me know if it is persistently 150/90 or higher.  Please continue to work on diet and exercise.  We will see you back in 1 year.  Please come back sooner if needed.  Take care, Dr Jerline Pain  PLEASE NOTE:  If you had any lab tests please let us know if you have not heard back within a few days. You may see your results on mychart before we have a chance to review them but we will give you a call once they are reviewed by [place]. If we ordered any referrals today, please let us know if you have not heard from their office within the next week.   Please try these tips to maintain a healthy lifestyle:  Eat at least 3 REAL meals and 1-2 snacks per day.  Aim for no more than 5 hours between eating.  If you eat breakfast, please do so within one hour of getting up.   Each meal should contain half fruits/vegetables, one quarter protein, and one quarter carbs (no bigger than a computer mouse)  Cut down on sweet beverages. This includes juice, soda, and sweet tea.   Drink at least 1 glass of water with each meal and aim for at least 8 glasses per day  Exercise at least 150 minutes every week.    Preventive Care 24 Years and Older, Male Preventive care refers to lifestyle choices and visits with your health care provider that can promote health and wellness. Preventive care visits are also called wellness exams. What can I expect for my preventive care visit? Counseling During your preventive care visit, your health care provider may ask about your: Medical history, including: Past medical problems. Family medical history. History of falls. Current health, including: Emotional well-being. Home life and relationship well-being. Sexual activity. Memory and ability to understand (cognition). Lifestyle, including: Alcohol, nicotine or tobacco, and drug use. Access to firearms. Diet,  exercise, and sleep habits. Work and work Statistician. Sunscreen use. Safety issues such as seatbelt and bike helmet use. Physical exam Your health care provider will check your: Height and weight. These may be used to calculate your BMI (body mass index). BMI is a measurement that tells if you are at a healthy weight. Waist circumference. This measures the distance around your waistline. This measurement also tells if you are at a healthy weight and may help predict your risk of certain diseases, such as type 2 diabetes and high blood pressure. Heart rate and blood pressure. Body temperature. Skin for abnormal spots. What immunizations do I need? Vaccines are usually given at various ages, according to a schedule. Your health care provider will recommend vaccines for you based on your age, medical history, and lifestyle or other factors, such as travel or where you work. What tests do I need? Screening Your health care provider may recommend screening tests for certain conditions. This may include: Lipid and cholesterol levels. Diabetes screening. This is done by checking your blood sugar (glucose) after you have not eaten for a while (fasting). Hepatitis C test. Hepatitis B test. HIV (human immunodeficiency virus) test. STI (sexually transmitted infection) testing, if you are at risk. Lung cancer screening. Colorectal cancer screening. Prostate cancer screening. Abdominal aortic aneurysm (AAA) screening. You may need this if you are a current or former smoker. Talk with your health care provider about your test  results, treatment options, and if necessary, the need for more tests. Follow these instructions at home: Eating and drinking  Eat a diet that includes fresh fruits and vegetables, whole grains, lean protein, and low-fat dairy products. Limit your intake of foods with high amounts of sugar, saturated fats, and salt. Take vitamin and mineral supplements as recommended by your  health care provider. Do not drink alcohol if your health care provider tells you not to drink. If you drink alcohol: Limit how much you have to 0-2 drinks a day. Know how much alcohol is in your drink. In the [place] one drink equals one 12 oz bottle of beer (355 mL), one 5 oz glass of wine (148 mL), or one 1 oz glass of hard liquor (44 mL). Lifestyle Brush your teeth every morning and night with fluoride toothpaste. Floss one time each day. Exercise for at least 30 minutes 5 or more days each week. Do not use any products that contain nicotine or tobacco. These products include cigarettes, chewing tobacco, and vaping devices, such as e-cigarettes. If you need help quitting, ask your health care provider. Do not use drugs. If you are sexually active, practice safe sex. Use a condom or other form of protection to prevent STIs. Take aspirin only as told by your health care provider. Make sure that you understand how much to take and what form to take. Work with your health care provider to find out whether it is safe and beneficial for you to take aspirin daily. Ask your health care provider if you need to take a cholesterol-lowering medicine (statin). Find healthy ways to manage stress, such as: Meditation, yoga, or listening to music. Journaling. Talking to a trusted person. Spending time with friends and family. Safety Always wear your seat belt while driving or riding in a vehicle. Do not drive: If you have been drinking alcohol. Do not ride with someone who has been drinking. When you are tired or distracted. While texting. If you have been using any mind-altering substances or drugs. Wear a helmet and other protective equipment during sports activities. If you have firearms in your house, make sure you follow all gun safety procedures. Minimize exposure to UV radiation to reduce your risk of skin cancer. What's next? Visit your health care provider once a year for an annual wellness  visit. Ask your health care provider how often you should have your eyes and teeth checked. Stay up to date on all vaccines. This information is not intended to replace advice given to you by your health care provider. Make sure you discuss any questions you have with your health care provider. Document Revised: 11/15/2020 Document Reviewed: 11/15/2020 Elsevier Patient Education  [place].

## 2021-04-18 NOTE — Progress Notes (Signed)
Please inform patient of the following:  His blood sugar is elevated and in the prediabetic range. Everything else is STABLE. Do not need to make any changes to his treatment plan at this time. We can recheck in a year.  [name]. Jerline Pain, MD 04/18/2021 7:53 AM

## 2021-04-19 ENCOUNTER — Encounter: Payer: Self-pay | Admitting: Internal Medicine

## 2021-05-29 ENCOUNTER — Encounter: Payer: Self-pay | Admitting: Podiatry

## 2021-05-29 ENCOUNTER — Other Ambulatory Visit: Payer: Self-pay

## 2021-05-29 ENCOUNTER — Ambulatory Visit: Payer: Medicare HMO | Admitting: Podiatry

## 2021-05-29 DIAGNOSIS — M79675 Pain in left toe(s): Secondary | ICD-10-CM

## 2021-05-29 DIAGNOSIS — M79674 Pain in right toe(s): Secondary | ICD-10-CM

## 2021-05-29 DIAGNOSIS — N182 Chronic kidney disease, stage 2 (mild): Secondary | ICD-10-CM

## 2021-05-29 DIAGNOSIS — Q828 Other specified congenital malformations of skin: Secondary | ICD-10-CM | POA: Diagnosis not present

## 2021-05-29 DIAGNOSIS — B351 Tinea unguium: Secondary | ICD-10-CM

## 2021-05-29 DIAGNOSIS — M216X9 Other acquired deformities of unspecified foot: Secondary | ICD-10-CM

## 2021-05-29 NOTE — Progress Notes (Signed)
This patient returns to my office for at risk foot care.  This patient requires this care by a professional since this patient will be at risk due to having chronic kidney disease.  Patient has painful callus on the outside bottom of both feet.  This patient is unable to cut nails himself since the patient cannot reach his nails.These nails are painful walking and wearing shoes. Patient has painful callus on the outside ball both feet.  Patient is unable to self treat.   This patient presents for at risk foot care today.  General Appearance  Alert, conversant and in no acute stress.  Vascular  Dorsalis pedis and posterior tibial  pulses are palpable  bilaterally.  Capillary return is within normal limits  bilaterally. Temperature is within normal limits  bilaterally.  Neurologic  Senn-Weinstein monofilament wire test within normal limits  bilaterally. Muscle power within normal limits bilaterally.  Nails Thick disfigured discolored nails with subungual debris  from hallux to fifth toes bilaterally. No evidence of bacterial infection or drainage bilaterally.  Orthopedic  No limitations of motion  feet .  No crepitus or effusions noted.  No bony pathology or digital deformities noted.  Skin  normotropic skin with no porokeratosis noted bilaterally.  No signs of infections or ulcers noted.  Porokeratosis sub 5th metatarsal  B/L.  Porokeratosis sub 5th metabase right foot.  Onychomycosis  Pain in right toes  Pain in left toes  Porokeratosis B/L.  Consent was obtained for treatment procedures.   Mechanical debridement of nails 1-5  bilaterally performed with a nail nipper.  Filed with dremel without incident. No infection or ulcer.  Debridement of porokeratosis  Sub 5th met  B/L with # 15 blade.   Return office visit     3 months                 Told patient to return for periodic foot care and evaluation due to potential at risk complications.   Emmarose Klinke DPM  

## 2021-06-06 ENCOUNTER — Other Ambulatory Visit: Payer: Self-pay | Admitting: Family Medicine

## 2021-06-14 ENCOUNTER — Other Ambulatory Visit: Payer: Self-pay | Admitting: Family Medicine

## 2021-06-25 ENCOUNTER — Other Ambulatory Visit: Payer: Self-pay | Admitting: Family Medicine

## 2021-07-01 ENCOUNTER — Other Ambulatory Visit: Payer: Self-pay | Admitting: Family Medicine

## 2021-07-02 NOTE — Telephone Encounter (Signed)
Alternative Requested:PRIOR AUTHORIZATION REQUIRED.

## 2021-07-02 NOTE — Telephone Encounter (Signed)
Please let patient know insurance stopped paying for his lovastatin. Ok to send in simvastatin 5mg  daily instead if patient is agreeable.

## 2021-07-03 ENCOUNTER — Telehealth: Payer: Self-pay | Admitting: *Deleted

## 2021-07-03 NOTE — Telephone Encounter (Signed)
PA Key: [id number] - PA Case ID: [id number] Status Sent to Plan today Drug Pravastatin Sodium 10MG  tablets Waiting for determination

## 2021-07-03 NOTE — Telephone Encounter (Signed)
Approvedtoday Your request has been approved This approval authorizes your coverage from 06/03/2021 - 06/02/2022 Pharmacy notified

## 2021-07-16 DIAGNOSIS — H401121 Primary open-angle glaucoma, left eye, mild stage: Secondary | ICD-10-CM | POA: Diagnosis not present

## 2021-07-25 ENCOUNTER — Encounter: Payer: Self-pay | Admitting: Internal Medicine

## 2021-07-28 ENCOUNTER — Other Ambulatory Visit: Payer: Self-pay | Admitting: Family Medicine

## 2021-08-06 ENCOUNTER — Telehealth: Payer: Self-pay

## 2021-08-06 DIAGNOSIS — H401121 Primary open-angle glaucoma, left eye, mild stage: Secondary | ICD-10-CM | POA: Diagnosis not present

## 2021-08-06 NOTE — Telephone Encounter (Signed)
Received a call from Dr.Heather [place].Stated patient is in her office now.Stated he woke up this morning with vision loss in left eye.Stated he has half moon vision loss in left eye.Stated she is concerned he might be having a early stroke.She wanted Dr.Jordan's advice.Dr.Jordan is out of office this week.Advised to go to ED to be evaluated. ?

## 2021-08-06 NOTE — Progress Notes (Deleted)
? ? ?Office Visit  ?  ?Patient Name: Todd Spears ?Date of Encounter: 08/06/2021 ? ?Primary Care Provider:  Vivi Barrack, MD ?Primary Cardiologist:  Peter Martinique, MD ? ?Chief Complaint  ?  ?76 year old male with a history of CAD, carotid artery stenosis, aortic stenosis, hypertension, hyperlipidemia, CKD stage III, gout, osteoarthritis and GERD presents for follow-up related to ? ?Past Medical History  ?  ?Past Medical History:  ?Diagnosis Date  ? Allergic rhinitis   ? Anemia   ? Asthma   ? CAD (coronary artery disease)   ? a. s/p PTCA/stenting of the mid LAD and mid RCA 04/06/12  ? Carotid bruit   ? r  ? Colon polyp   ? Diverticulosis   ? ED (erectile dysfunction)   ? Fatty liver   ? GERD (gastroesophageal reflux disease)   ? Gout   ? Hyperlipidemia   ? Hypertension   ? Obesity   ? Osteoarthritis   ? Renal insufficiency   ? Restless leg syndrome   ? ?Past Surgical History:  ?Procedure Laterality Date  ? COLONOSCOPY    ? INGUINAL HERNIA REPAIR Right 02/27/2016  ? Procedure: HERNIA REPAIR INGUINAL ADULT;  Surgeon: Rolm Bookbinder, MD;  Location: [facility];  Service: General;  Laterality: Right;  ? INSERTION OF MESH Right 02/27/2016  ? Procedure: INSERTION OF MESH--IN AND OUT CATHETERIZATION PERFORMED AT END OF PROCEDURE TO DRAIN 400ML URINE.;  Surgeon: Rolm Bookbinder, MD;  Location: [facility];  Service: General;  Laterality: Right;  ? LYMPH NODE BIOPSY    ? right side of neck 1999 - benign  ? PERCUTANEOUS CORONARY STENT INTERVENTION (PCI-S) N/A 04/06/2012  ? Procedure: PERCUTANEOUS CORONARY STENT INTERVENTION (PCI-S);  Surgeon: Peter M Martinique, MD; Promus DES for 90% mid LAD and 95% mid RCA disease    ? ? ?Allergies ? ?Allergies  ?Allergen Reactions  ? Prednisone Shortness Of Breath and Swelling  ? Crestor [Rosuvastatin Calcium] Other (See Comments)  ?  myalgia  ? Lyrica [Pregabalin]   ?  "swelling"  ? ? ?History of Present Illness  ?  ?*** ? ? ?77 year old male with a history of  CAD, carotid artery stenosis, aortic stenosis, hypertension, hyperlipidemia, CKD stage III, gout, osteoarthritis and GERD. ? ?He has a history of CAD s/p DES-mLAD and mRCA in 2013.  EF was 55% at the time.  He had no significant symptoms at the time but did have an abnormal stress test.  Myoview in November 2014 showed fixed inferior defect without ischemia.  Most recent Myoview in 2017 was unchanged.  He was hospitalized in January 2017 with tremors and hyponatremia.  Hyponatremia was thought to be secondary to chlorthalidone use, which was discontinued at the time. Echocardiogram in March 2017 showed normal LV function, mild aortic stenosis.   He has a history of carotid artery stenosis. Most recent carotid Dopplers in October 2022 carotid Dopplers at the time showed R ICA 1 to 65% stenosis and LICA 68% % stenosis.  Annual monitoring recommended.  He has followed in the lipid clinic for hyperlipidemia with prior intolerance to statins, and is manage on pravastatin and Zetia. ? ?He was last seen in the office on 01/22/2021 and was stable overall from a cardiac standpoint.  Weight loss was encouraged for mildly elevated BP.  He saw his PCP on 08/06/2021 with new onset vision loss in the left eye.  PCP called our office, patient was advised to go to the ED. ? ?Vision loss: ?CAD: ?  Aortic stenosis: ?Carotid artery stenosis: ?Hypertension: ?Hyperlipidemia: ?CKD stage III: ?Disposition: ? ?Home Medications  ?  ?Current Outpatient Medications  ?Medication Sig Dispense Refill  ? albuterol (VENTOLIN HFA) 108 (90 Base) MCG/ACT inhaler INHALE 2 PUFFS INTO LUNGS EVERY 6 HOURS AS NEEDED FOR SHORTNESS OF BREATH 8.5 each 3  ? amLODipine (NORVASC) 10 MG tablet TAKE 1 TABLET BY MOUTH EVERY DAY 90 tablet 2  ? aspirin 81 MG chewable tablet Chew 81 mg by mouth daily.    ? Azelastine HCl 137 MCG/SPRAY SOLN INSTILL 2 SPRAYS INTO BOTH NOSTRILS 2 TIMES DAILY. USE IN EACH NOSTRIL AS DIRECTED 30 mL 2  ? cetirizine (ZYRTEC) 10 MG tablet Take 10  mg by mouth every morning.    ? Cyanocobalamin (VITAMIN B 12 PO) Take 1 tablet by mouth daily.     ? diclofenac (VOLTAREN) 50 MG EC tablet Take 1 tablet (50 mg total) by mouth 2 (two) times daily. 30 tablet 0  ? diclofenac sodium (VOLTAREN) 1 % GEL Apply 2 g topically 4 (four) times daily. 100 g 3  ? esomeprazole (NEXIUM) 40 MG capsule TAKE 1 CAPSULE BY MOUTH DAILY BEFORE MEALS 90 capsule 2  ? ezetimibe (ZETIA) 10 MG tablet Take 1 tablet (10 mg total) by mouth daily. 90 tablet 2  ? FeFum-FePoly-FA-B Cmp-C-Biot (INTEGRA PLUS) CAPS TAKE 1 CAPSULE BY MOUTH EVERY DAY 90 capsule 3  ? hydrALAZINE (APRESOLINE) 50 MG tablet TAKE 2 TABLETS BY MOUTH 3 TIMES A DAY 540 tablet 3  ? lisinopril (ZESTRIL) 40 MG tablet TAKE 1 TABLET BY MOUTH EVERY DAY 90 tablet 1  ? loratadine (CLARITIN) 10 MG tablet Take 10 mg by mouth at bedtime.     ? LUMIGAN 0.01 % SOLN Place 1 drop into the left eye at bedtime.    ? metoprolol succinate (TOPROL-XL) 50 MG 24 hr tablet TAKE 1 TABLET EVERY DAY WITH OR IMMEDIATELY FOLLOWING A MEAL 90 tablet 2  ? montelukast (SINGULAIR) 10 MG tablet TAKE 1 TABLET BY MOUTH EVERYDAY AT BEDTIME 90 tablet 3  ? Multiple Vitamin (MULTIVITAMIN WITH MINERALS) TABS tablet Take 1 tablet by mouth daily.    ? nitroGLYCERIN (NITROSTAT) 0.4 MG SL tablet Place 1 tablet (0.4 mg total) under the tongue every 5 (five) minutes as needed for chest pain (up to 3 doses. not within 24hrs of Viagra). 25 tablet 6  ? pravastatin (PRAVACHOL) 10 MG tablet TAKE 1 TABLET EVERY OTHER DAY, ALTERNATING WITH 2 TABLETS. 135 tablet 1  ? traMADol (ULTRAM) 50 MG tablet TAKE 1 TABLET (50 MG TOTAL) BY MOUTH EVERY 12 (TWELVE) HOURS. 60 tablet 5  ? ?No current facility-administered medications for this visit.  ?  ? ?Review of Systems  ?  ?***.  All other systems reviewed and are otherwise negative except as noted above. ?  ? ?Physical Exam  ?  ?VS:  There were no vitals taken for this visit. , BMI There is no height or weight on file to calculate BMI. ?     ?GEN: Well nourished, well developed, in no acute distress. ?HEENT: normal. ?Neck: Supple, no JVD, carotid bruits, or masses. ?Cardiac: RRR, no murmurs, rubs, or gallops. No clubbing, cyanosis, edema.  Radials/DP/PT 2+ and equal bilaterally.  ?Respiratory:  Respirations regular and unlabored, clear to auscultation bilaterally. ?GI: Soft, nontender, nondistended, BS + x 4. ?MS: no deformity or atrophy. ?Skin: warm and dry, no rash. ?Neuro:  Strength and sensation are intact. ?Psych: Normal affect. ? ?Accessory Clinical Findings  ?  ?ECG  personally reviewed by me today - *** - no acute changes. ? ?Lab Results  ?Component Value Date  ? WBC 5.9 04/17/2021  ? HGB 12.4 (L) 04/17/2021  ? HCT 36.6 (L) 04/17/2021  ? MCV 92.9 04/17/2021  ? PLT 223.0 04/17/2021  ? ?Lab Results  ?Component Value Date  ? CREATININE 1.26 04/17/2021  ? BUN 17 04/17/2021  ? NA 135 04/17/2021  ? K 4.9 04/17/2021  ? CL 99 04/17/2021  ? CO2 29 04/17/2021  ? ?Lab Results  ?Component Value Date  ? ALT 33 04/17/2021  ? AST 29 04/17/2021  ? ALKPHOS 44 04/17/2021  ? BILITOT 0.8 04/17/2021  ? ?Lab Results  ?Component Value Date  ? CHOL 148 04/17/2021  ? HDL 46.70 04/17/2021  ? Reynolds 72 04/17/2021  ? LDLDIRECT 164.9 12/10/2010  ? TRIG 147.0 04/17/2021  ? CHOLHDL 3 04/17/2021  ?  ?Lab Results  ?Component Value Date  ? HGBA1C 6.2 04/17/2021  ? ? ?Assessment & Plan  ?  ?1.  *** ? ? ?Lenna Sciara, NP ?08/06/2021, 4:27 PM ?  ? ?

## 2021-08-07 ENCOUNTER — Emergency Department (HOSPITAL_COMMUNITY): Payer: Medicare HMO

## 2021-08-07 ENCOUNTER — Emergency Department (HOSPITAL_BASED_OUTPATIENT_CLINIC_OR_DEPARTMENT_OTHER): Payer: Medicare HMO

## 2021-08-07 ENCOUNTER — Telehealth: Payer: Self-pay

## 2021-08-07 ENCOUNTER — Emergency Department (HOSPITAL_BASED_OUTPATIENT_CLINIC_OR_DEPARTMENT_OTHER)
Admission: EM | Admit: 2021-08-07 | Discharge: 2021-08-07 | Disposition: A | Discharge status: Home / Self Care | Payer: Medicare HMO | Attending: Emergency Medicine | Admitting: Emergency Medicine

## 2021-08-07 ENCOUNTER — Encounter (HOSPITAL_BASED_OUTPATIENT_CLINIC_OR_DEPARTMENT_OTHER): Payer: Self-pay | Admitting: Emergency Medicine

## 2021-08-07 ENCOUNTER — Ambulatory Visit: Payer: Medicare HMO | Admitting: Nurse Practitioner

## 2021-08-07 ENCOUNTER — Other Ambulatory Visit: Payer: Self-pay

## 2021-08-07 ENCOUNTER — Telehealth: Payer: Self-pay | Admitting: *Deleted

## 2021-08-07 DIAGNOSIS — Z7982 Long term (current) use of aspirin: Secondary | ICD-10-CM | POA: Insufficient documentation

## 2021-08-07 DIAGNOSIS — I2510 Atherosclerotic heart disease of native coronary artery without angina pectoris: Secondary | ICD-10-CM | POA: Insufficient documentation

## 2021-08-07 DIAGNOSIS — Z79899 Other long term (current) drug therapy: Secondary | ICD-10-CM | POA: Diagnosis not present

## 2021-08-07 DIAGNOSIS — H539 Unspecified visual disturbance: Secondary | ICD-10-CM | POA: Diagnosis not present

## 2021-08-07 DIAGNOSIS — R29818 Other symptoms and signs involving the nervous system: Secondary | ICD-10-CM | POA: Diagnosis not present

## 2021-08-07 DIAGNOSIS — H5340 Unspecified visual field defects: Secondary | ICD-10-CM | POA: Insufficient documentation

## 2021-08-07 DIAGNOSIS — I10 Essential (primary) hypertension: Secondary | ICD-10-CM | POA: Diagnosis not present

## 2021-08-07 DIAGNOSIS — Z955 Presence of coronary angioplasty implant and graft: Secondary | ICD-10-CM | POA: Diagnosis not present

## 2021-08-07 DIAGNOSIS — H547 Unspecified visual loss: Secondary | ICD-10-CM | POA: Diagnosis not present

## 2021-08-07 DIAGNOSIS — Z0389 Encounter for observation for other suspected diseases and conditions ruled out: Secondary | ICD-10-CM | POA: Diagnosis not present

## 2021-08-07 DIAGNOSIS — J329 Chronic sinusitis, unspecified: Secondary | ICD-10-CM | POA: Diagnosis not present

## 2021-08-07 DIAGNOSIS — H538 Other visual disturbances: Secondary | ICD-10-CM | POA: Diagnosis not present

## 2021-08-07 LAB — COMPREHENSIVE METABOLIC PANEL
ALT: 33 U/L (ref 0–44)
AST: 30 U/L (ref 15–41)
Albumin: 4.4 g/dL (ref 3.5–5.0)
Alkaline Phosphatase: 41 U/L (ref 38–126)
Anion gap: 10 (ref 5–15)
BUN: 16 mg/dL (ref 8–23)
CO2: 24 mmol/L (ref 22–32)
Calcium: 9.4 mg/dL (ref 8.9–10.3)
Chloride: 101 mmol/L (ref 98–111)
Creatinine, Ser: 1.12 mg/dL (ref 0.61–1.24)
GFR, Estimated: >60 mL/min (ref >60)
Glucose, Bld: 125 mg/dL — ABNORMAL HIGH (ref 70–99)
Potassium: 4 mmol/L (ref 3.5–5.1)
Sodium: 135 mmol/L (ref 135–145)
Total Bilirubin: 0.7 mg/dL (ref 0.3–1.2)
Total Protein: 7.5 g/dL (ref 6.5–8.1)

## 2021-08-07 LAB — DIFFERENTIAL
Abs Immature Granulocytes: 0.02 10*3/uL (ref 0.00–0.07)
Basophils Absolute: 0.1 10*3/uL (ref 0.0–0.1)
Basophils Relative: 1 %
Eosinophils Absolute: 0.2 10*3/uL (ref 0.0–0.5)
Eosinophils Relative: 3 %
Immature Granulocytes: 0 %
Lymphocytes Relative: 38 %
Lymphs Abs: 2.4 10*3/uL (ref 0.7–4.0)
Monocytes Absolute: 0.6 10*3/uL (ref 0.1–1.0)
Monocytes Relative: 9 %
Neutro Abs: 3.1 10*3/uL (ref 1.7–7.7)
Neutrophils Relative %: 49 %

## 2021-08-07 LAB — CBC
HCT: 36.3 % — ABNORMAL LOW (ref 39.0–52.0)
Hemoglobin: 12.3 g/dL — ABNORMAL LOW (ref 13.0–17.0)
MCH: 31.1 pg (ref 26.0–34.0)
MCHC: 33.9 g/dL (ref 30.0–36.0)
MCV: 91.9 fL (ref 80.0–100.0)
Platelets: 239 10*3/uL (ref 150–400)
RBC: 3.95 MIL/uL — ABNORMAL LOW (ref 4.22–5.81)
RDW: 12.5 % (ref 11.5–15.5)
WBC: 6.5 10*3/uL (ref 4.0–10.5)
nRBC: 0 % (ref 0.0–0.2)

## 2021-08-07 LAB — PROTIME-INR
INR: 0.9 (ref 0.8–1.2)
Prothrombin Time: 12.6 seconds (ref 11.4–15.2)

## 2021-08-07 LAB — APTT: aPTT: 30 seconds (ref 24–36)

## 2021-08-07 MED ORDER — GADOBUTROL 1 MMOL/ML IV SOLN
10.0000 mL | Freq: Once | INTRAVENOUS | Status: AC | PRN
  Administered 2021-08-07: 10 mL via INTRAVENOUS

## 2021-08-07 NOTE — ED Notes (Signed)
Pt's iv secured for transfer to [facility]. Pt made aware to go to ER for MRI. Wife with pt. ?

## 2021-08-07 NOTE — Telephone Encounter (Signed)
Agree with plan. Needs to go to ED to rule out stroke.  ?

## 2021-08-07 NOTE — ED Provider Notes (Signed)
[facility] EMERGENCY DEPARTMENT Provider Note   CSN: [id number] Arrival date & time: 08/07/21  0941     History  Chief Complaint  Patient presents with   Blurred Vision    Todd Spears is a 76 y.o. male.  HPI  76 year old male with past medical history of HTN, HLD, CAD status post stenting presents to the emergency department as a transfer from med center for left thigh vision changes.  Patient reportedly started yesterday morning had a crescent shaped pericentral area of vision loss in the left eye.  Was evaluated by optometry as an outpatient, ocular exam was said to be normal, referred to the ER for concern of neuro pathology.  Patient was seen at one of our med centers previously.  Neurology Dr. Cheral Marker was consulted as well as the outpatient optometrist Dr. Alethia Berthold who evaluated the patient.  No other associated neurologic symptoms in regards to TIA/CVA.  Plan was to transfer to [facility] for MRI evaluation.  On my evaluation patient states that the visual deficit has resolved there was never any other associated neuro deficit.  Patient feels back to baseline has no complaints.  Is otherwise compliant with medications.  Home Medications Prior to Admission medications   Medication Sig Start Date End Date Taking? Authorizing Provider  amLODipine (NORVASC) 10 MG tablet TAKE 1 TABLET BY MOUTH EVERY DAY 02/12/21  Yes Vivi Barrack, MD  aspirin 81 MG chewable tablet Chew 81 mg by mouth daily.   Yes [provider]  Azelastine HCl 137 MCG/SPRAY SOLN INSTILL 2 SPRAYS INTO BOTH NOSTRILS 2 TIMES DAILY. USE IN EACH NOSTRIL AS DIRECTED 01/08/21  Yes Vivi Barrack, MD  cetirizine (ZYRTEC) 10 MG tablet Take 10 mg by mouth every morning.   Yes [provider]  Cyanocobalamin (VITAMIN B 12 PO) Take 1 tablet by mouth daily.    Yes [provider]  esomeprazole (NEXIUM) 40 MG capsule TAKE 1 CAPSULE BY MOUTH DAILY BEFORE MEALS 04/17/21  Yes Vivi Barrack, MD  ezetimibe (ZETIA) 10 MG tablet Take 1 tablet (10 mg total) by mouth daily. 04/17/21  Yes Vivi Barrack, MD  FeFum-FePoly-FA-B Cmp-C-Biot (INTEGRA PLUS) CAPS TAKE 1 CAPSULE BY MOUTH EVERY DAY 06/26/21  Yes Vivi Barrack, MD  hydrALAZINE (APRESOLINE) 50 MG tablet TAKE 2 TABLETS BY MOUTH 3 TIMES A DAY 02/06/21  Yes Vivi Barrack, MD  lisinopril (ZESTRIL) 40 MG tablet TAKE 1 TABLET BY MOUTH EVERY DAY 02/12/21  Yes Vivi Barrack, MD  loratadine (CLARITIN) 10 MG tablet Take 10 mg by mouth at bedtime.    Yes [provider]  LUMIGAN 0.01 % SOLN Place 1 drop into the left eye at bedtime. 09/05/20  Yes [provider]  metoprolol succinate (TOPROL-XL) 50 MG 24 hr tablet TAKE 1 TABLET EVERY DAY WITH OR IMMEDIATELY FOLLOWING A MEAL 06/14/21  Yes Vivi Barrack, MD  montelukast (SINGULAIR) 10 MG tablet TAKE 1 TABLET BY MOUTH EVERYDAY AT BEDTIME 02/12/21  Yes Vivi Barrack, MD  Multiple Vitamin (MULTIVITAMIN WITH MINERALS) TABS tablet Take 1 tablet by mouth daily.   Yes [provider]  pravastatin (PRAVACHOL) 10 MG tablet TAKE 1 TABLET EVERY OTHER DAY, ALTERNATING WITH 2 TABLETS. 06/06/21  Yes Vivi Barrack, MD  albuterol (VENTOLIN HFA) 108 (90 Base) MCG/ACT inhaler INHALE 2 PUFFS INTO LUNGS EVERY 6 HOURS AS NEEDED FOR SHORTNESS OF BREATH 07/30/21   Vivi Barrack, MD  diclofenac (VOLTAREN) 50  MG EC tablet Take 1 tablet (50 mg total) by mouth 2 (two) times daily. 06/23/18   Vivi Barrack, MD  diclofenac sodium (VOLTAREN) 1 % GEL Apply 2 g topically 4 (four) times daily. 06/23/18   Vivi Barrack, MD  nitroGLYCERIN (NITROSTAT) 0.4 MG SL tablet Place 1 tablet (0.4 mg total) under the tongue every 5 (five) minutes as needed for chest pain (up to 3 doses. not within 24hrs of Viagra). 11/21/14   [place], Peter M, MD  traMADol (ULTRAM) 50 MG tablet TAKE 1 TABLET (50 MG TOTAL) BY MOUTH EVERY 12 (TWELVE) HOURS. 09/04/20   Vivi Barrack, MD      Allergies    Prednisone, Crestor  [rosuvastatin calcium], and Lyrica [pregabalin]    Review of Systems   Review of Systems  Constitutional:  Negative for fever.  Eyes:  Positive for visual disturbance. Negative for photophobia, discharge and redness.  Respiratory:  Negative for shortness of breath.   Cardiovascular:  Negative for chest pain.  Gastrointestinal:  Negative for abdominal pain, diarrhea and vomiting.  Skin:  Negative for rash.  Neurological:  Negative for headaches.   Physical Exam Updated Vital Signs BP (!) 160/74    Pulse 64    Temp 97.9 F (36.6 C) (Oral)    Resp 14    Ht 5' 7" (1.702 m)    Wt 112 kg    SpO2 98%    BMI 38.69 kg/m  Physical Exam Vitals and nursing note reviewed.  Constitutional:      Appearance: Normal appearance.  HENT:     Head: Normocephalic.     Mouth/Throat:     Mouth: Mucous membranes are moist.  Cardiovascular:     Rate and Rhythm: Normal rate.  Pulmonary:     Effort: Pulmonary effort is normal. No respiratory distress.  Abdominal:     Palpations: Abdomen is soft.     Tenderness: There is no abdominal tenderness.  Skin:    General: Skin is warm.  Neurological:     General: No focal deficit present.     Mental Status: He is alert and oriented to person, place, and time. Mental status is at baseline.  Psychiatric:        Mood and Affect: Mood normal.    ED Results / Procedures / Treatments   Labs (all labs ordered are listed, but only abnormal results are displayed) Labs Reviewed  CBC - Abnormal; Notable for the following components:      Result Value   RBC 3.95 (*)    Hemoglobin 12.3 (*)    HCT 36.3 (*)    All other components within normal limits  COMPREHENSIVE METABOLIC PANEL - Abnormal; Notable for the following components:   Glucose, Bld 125 (*)    All other components within normal limits  PROTIME-INR  APTT  DIFFERENTIAL  CBG MONITORING, ED    EKG EKG Interpretation  Date/Time:  Tuesday August 07 2021 10:37:37 EST Ventricular Rate:  74 PR  Interval:  219 QRS Duration: 101 QT Interval:  390 QTC Calculation: 433 R Axis:   21 Text Interpretation: Sinus rhythm Borderline prolonged PR interval Low voltage, precordial leads Confirmed by Pattricia Boss [phone]) on 08/07/2021 11:36:32 AM  Radiology CT HEAD WO CONTRAST  Result Date: 08/07/2021 CLINICAL DATA:  76 year old male with history of blurred vision in the left eye. Pressure behind the left eye. EXAM: CT HEAD WITHOUT CONTRAST TECHNIQUE: Contiguous axial images were obtained from the base of the skull through the  vertex without intravenous contrast. RADIATION DOSE REDUCTION: This exam was performed according to the departmental dose-optimization program which includes automated exposure control, adjustment of the mA and/or kV according to patient size and/or use of iterative reconstruction technique. COMPARISON:  No priors. FINDINGS: Brain: No evidence of acute infarction, hemorrhage, hydrocephalus, extra-axial collection or mass lesion/mass effect. Vascular: No hyperdense vessel or unexpected calcification. Skull: Normal. Negative for fracture or focal lesion. Sinuses/Orbits: No acute finding. Specifically, globes and retro-orbital soft tissues are grossly normal in appearance. Multifocal mucosal thickening in the frontal, ethmoid and maxillary sinuses bilaterally. Other: Partially calcified soft tissue attenuation lesion in the occipital scalp (axial image 11 of series 2) measuring 1.2 x 1.7 cm, nonspecific, but potentially related to remote trauma. IMPRESSION: 1. No acute intracranial abnormalities. Bilateral globes and retro-orbital soft tissues are grossly normal in appearance. 2. Mild chronic paranasal sinus disease, as above. Electronically Signed   By: Vinnie Langton M.D.   On: 08/07/2021 10:38   MR Brain W and Wo Contrast  Result Date: 08/07/2021 CLINICAL DATA:  Neuro deficit, acute, stroke suspected. Blurred vision in the left eye. EXAM: MRI HEAD WITHOUT AND WITH CONTRAST TECHNIQUE:  Multiplanar, multiecho pulse sequences of the brain and surrounding structures were obtained without and with intravenous contrast. CONTRAST:  35m GADAVIST GADOBUTROL 1 MMOL/ML IV SOLN COMPARISON:  Head CT earlier today.  MRI 11/09/2007 FINDINGS: Brain: Diffusion imaging does not show any acute or subacute infarction. The brainstem and cerebellum are normal. Cerebral hemispheres are normal for age with mild age related volume loss but without a pattern of small-vessel disease or any old lacunar infarction or cortical/large vessel infarction. No mass, hemorrhage, hydrocephalus or extra-axial collection. Vascular: Major vessels at the base of the brain show flow. Skull and upper cervical spine: Negative Sinuses/Orbits: Minimal areas of mucosal thickening, without fluid opacification. Small amount of fluid in the mastoid air cells on the right. Orbits negative. Other: None IMPRESSION: Normal MRI of the brain for age.  No acute or subacute infarction. Mild mucosal inflammatory changes of the paranasal sinuses. Electronically Signed   By: MNelson ChimesM.D.   On: 08/07/2021 15:07    Procedures Procedures    Medications Ordered in ED Medications  gadobutrol (GADAVIST) 1 MMOL/ML injection 10 mL (10 mLs Intravenous Contrast Given 08/07/21 1502)    ED Course/ Medical Decision Making/ A&P Clinical Course as of 08/07/21 1655  Tue Aug 07, 2021  1204 CBC reviewed, interpreted normal C-Met reviewed and interpreted and within normal limits [DR]    Clinical Course User Index [DR] RPattricia Boss MD                           Medical Decision Making Amount and/or Complexity of Data Reviewed Labs: ordered. Radiology: ordered.  Risk Prescription drug management.   76year old male presents emergency department as a transfer from med center for MRI.  Started with left eye visual deficit yesterday morning, evaluated by optometry as an outpatient.  Evaluated at med center prior to arrival here, neurology  consulted and [facility].  MRI imaging here is negative for acute stroke or neuro pathology.  On my evaluation patient has had resolution of visual field deficit.  Patient is back to baseline, NIH 0.  Plan per previous consultation conversation with Dr. LCheral Markerwas if MRI is negative can be discharged for outpatient follow-up with optometry.  Patient is already on  a baby aspirin daily as well as a statin.  Discussed results with patient and wife at bedside.  Plan for outpatient neurology and optometry follow-up.  Patient at this time appears safe and stable for discharge and close outpatient follow up. Discharge plan and strict return to ED precautions discussed, patient verbalizes understanding and agreement.        Final Clinical Impression(s) / ED Diagnoses Final diagnoses:  Visual field cut    Rx / DC Orders ED Discharge Orders     None         Lorelle Gibbs, DO 08/07/21 1700

## 2021-08-07 NOTE — Discharge Instructions (Addendum)
You have been seen and discharged from the emergency department.  The MRI of your brain was normal.  Follow-up with neurology as an outpatient and your primary optometrist for further evaluation and further care. Take home medications as prescribed. If you have any worsening symptoms, further vision changes, other neurologic symptoms or further concerns for your health please return to an emergency department for further evaluation. ?

## 2021-08-07 NOTE — ED Notes (Signed)
Attempt to call Charge RN no answer ?

## 2021-08-07 NOTE — ED Triage Notes (Signed)
Patient arrives ambulatory with wife c/o blurred vision to left eye and a pressure behind eye when waking up yesterday. Yesterday afternoon went to eye dr and was told to go to ER for stroke work up. No neuro deficits noted in triage.  ?

## 2021-08-07 NOTE — Telephone Encounter (Signed)
Spoke with patient, stated was at [facility] Dr yesterday due to vision blurry. Per Eye Dr everything look good and no reason of symptoms found. Was advise to schedule appointment with Cardiology.  ?Cardiology appointment schedule and cancelled was recommended per Cardiology to go to ED to rule out mini stoke. Patient call for advise. Was advise to go to ED. Patient stated may go today. Stated still with blurry vision and moon shape when closing his eye No other symptoms per patient. ?

## 2021-08-07 NOTE — Telephone Encounter (Signed)
Spoke with pt this morning 08/07/2021 @ 7:45 AM. Pts doctor called yesterday 08/06/2021 to schedule an appointment for visual loss while pt was in their office. Pt was advised to go to the ED per Dr. Doug Sou recommendations. Pt called back to our office and was scheduled with Diona Browner NP today @ 11:45 AM. Pt is aware that he should go to the ED per Dr. Doug Sou recommendations. Pt states that he doesn't want to go to the ED. Pt reports no SOB, chest pain or heart symptoms and was asked a second time to go to the ED per Dr. Doug Sou recommendations. Appointment was canceled ok per Diona Browner NP.  ?

## 2021-08-07 NOTE — ED Provider Notes (Signed)
[facility] EMERGENCY DEPT Provider Note   CSN: [id number] Arrival date & time: 08/07/21  0941     History  Chief Complaint  Todd Spears presents with   Blurred Vision    Todd Spears is a 76 y.o. Spears.  HPI 76 yo Spears with blurring vision of left eye began on awakening yesterday,  seen by Dr. [place], optometrist yesterday.  She checked vision and "took pictures" of both my eyes, dilated eyes, checked iop and told normal.  Todd Spears states she could not find anything wrong.  States visual defect like half moon in left eye medial lower visual field.  No history of stroke, no blood thinners.No weakness, motor or sensory loss, or difficulty walking. No history of stroke. Known htn and normally increases at MD, but lowest last night 157/69. Visual blurring decreased today, but continues to have crescent defect in left eye.      Home Medications Prior to Admission medications   Medication Sig Start Date End Date Taking? Authorizing Provider  amLODipine (NORVASC) 10 MG tablet TAKE 1 TABLET BY MOUTH EVERY DAY 02/12/21  Yes Vivi Barrack, MD  aspirin 81 MG chewable tablet Chew 81 mg by mouth daily.   Yes [provider]  Azelastine HCl 137 MCG/SPRAY SOLN INSTILL 2 SPRAYS INTO BOTH NOSTRILS 2 TIMES DAILY. USE IN EACH NOSTRIL AS DIRECTED 01/08/21  Yes Vivi Barrack, MD  cetirizine (ZYRTEC) 10 MG tablet Take 10 mg by mouth every morning.   Yes [provider]  Cyanocobalamin (VITAMIN B 12 PO) Take 1 tablet by mouth daily.    Yes [provider]  esomeprazole (NEXIUM) 40 MG capsule TAKE 1 CAPSULE BY MOUTH DAILY BEFORE MEALS 04/17/21  Yes Vivi Barrack, MD  ezetimibe (ZETIA) 10 MG tablet Take 1 tablet (10 mg total) by mouth daily. 04/17/21  Yes Vivi Barrack, MD  FeFum-FePoly-FA-B Cmp-C-Biot (INTEGRA PLUS) CAPS TAKE 1 CAPSULE BY MOUTH EVERY DAY 06/26/21  Yes Vivi Barrack, MD  hydrALAZINE (APRESOLINE) 50 MG tablet TAKE 2 TABLETS BY MOUTH 3 TIMES A DAY 02/06/21   Yes Vivi Barrack, MD  lisinopril (ZESTRIL) 40 MG tablet TAKE 1 TABLET BY MOUTH EVERY DAY 02/12/21  Yes Vivi Barrack, MD  loratadine (CLARITIN) 10 MG tablet Take 10 mg by mouth at bedtime.    Yes [provider]  LUMIGAN 0.01 % SOLN Place 1 drop into the left eye at bedtime. 09/05/20  Yes [provider]  metoprolol succinate (TOPROL-XL) 50 MG 24 hr tablet TAKE 1 TABLET EVERY DAY WITH OR IMMEDIATELY FOLLOWING A MEAL 06/14/21  Yes Vivi Barrack, MD  montelukast (SINGULAIR) 10 MG tablet TAKE 1 TABLET BY MOUTH EVERYDAY AT BEDTIME 02/12/21  Yes Vivi Barrack, MD  Multiple Vitamin (MULTIVITAMIN WITH MINERALS) TABS tablet Take 1 tablet by mouth daily.   Yes [provider]  pravastatin (PRAVACHOL) 10 MG tablet TAKE 1 TABLET EVERY OTHER DAY, ALTERNATING WITH 2 TABLETS. 06/06/21  Yes Vivi Barrack, MD  albuterol (VENTOLIN HFA) 108 (90 Base) MCG/ACT inhaler INHALE 2 PUFFS INTO LUNGS EVERY 6 HOURS AS NEEDED FOR SHORTNESS OF BREATH 07/30/21   Vivi Barrack, MD  diclofenac (VOLTAREN) 50 MG EC tablet Take 1 tablet (50 mg total) by mouth 2 (two) times daily. 06/23/18   Vivi Barrack, MD  diclofenac sodium (VOLTAREN) 1 % GEL Apply 2 g topically 4 (four) times daily. 06/23/18   Vivi Barrack, MD  nitroGLYCERIN (NITROSTAT) 0.4 MG SL tablet  Place 1 tablet (0.4 mg total) under the tongue every 5 (five) minutes as needed for chest pain (up to 3 doses. not within 24hrs of Viagra). 11/21/14   [place], Peter M, MD  traMADol (ULTRAM) 50 MG tablet TAKE 1 TABLET (50 MG TOTAL) BY MOUTH EVERY 12 (TWELVE) HOURS. 09/04/20   Vivi Barrack, MD      Allergies    Prednisone, Crestor [rosuvastatin calcium], and Lyrica [pregabalin]    Review of Systems   Review of Systems  Constitutional: Negative.   HENT:  Positive for congestion.   Eyes:  Positive for visual disturbance. Negative for photophobia.  Respiratory:  Negative for cough and shortness of breath.   Cardiovascular: Negative.    Gastrointestinal: Negative.   Musculoskeletal: Negative.   All other systems reviewed and are negative.  Physical Exam Updated Vital Signs BP (!) 177/73    Pulse 70    Temp 97.9 F (36.6 C) (Oral)    Resp 17    Ht 1.702 m ('5\' 7"' )    Wt 112 kg    SpO2 98%    BMI 38.69 kg/m  Physical Exam Vitals and nursing note reviewed.  HENT:     Head: Normocephalic.     Right Ear: External ear normal.     Left Ear: External ear normal.     Nose: Nose normal.     Mouth/Throat:     Pharynx: Oropharynx is clear.  Eyes:     Extraocular Movements: Extraocular movements intact.     Pupils: Pupils are equal, round, and reactive to light.  Cardiovascular:     Rate and Rhythm: Normal rate and regular rhythm.     Pulses: Normal pulses.     Heart sounds: Murmur heard.  Pulmonary:     Effort: Pulmonary effort is normal. No respiratory distress.  Abdominal:     General: Abdomen is flat.     Palpations: Abdomen is soft.  Musculoskeletal:        General: Normal range of motion.     Cervical back: Normal range of motion.  Skin:    General: Skin is warm.     Capillary Refill: Capillary refill takes less than 2 seconds.  Neurological:     General: No focal deficit present.     Mental Status: Todd Spears is alert.     Cranial Nerves: No cranial nerve deficit.     Sensory: No sensory deficit.     Motor: No weakness.     Coordination: Coordination normal.  Psychiatric:        Mood and Affect: Mood normal.    ED Results / Procedures / Treatments   Labs (all labs ordered are listed, but only abnormal results are displayed) Labs Reviewed  CBC - Abnormal; Notable for the following components:      Result Value   RBC 3.95 (*)    Hemoglobin 12.3 (*)    HCT 36.3 (*)    All other components within normal limits  COMPREHENSIVE METABOLIC PANEL - Abnormal; Notable for the following components:   Glucose, Bld 125 (*)    All other components within normal limits  PROTIME-INR  APTT  DIFFERENTIAL  CBG  MONITORING, ED    EKG EKG Interpretation  Date/Time:  Tuesday August 07 2021 10:37:37 EST Ventricular Rate:  74 PR Interval:  219 QRS Duration: 101 QT Interval:  390 QTC Calculation: 433 R Axis:   21 Text Interpretation: Sinus rhythm Borderline prolonged PR interval Low voltage, precordial leads Confirmed by Mariachristina Holle,  Andee Poles [phone]) on 08/07/2021 11:36:32 AM  Radiology CT HEAD WO CONTRAST  Result Date: 08/07/2021 CLINICAL DATA:  76 year old Spears with history of blurred vision in the left eye. Pressure behind the left eye. EXAM: CT HEAD WITHOUT CONTRAST TECHNIQUE: Contiguous axial images were obtained from the base of the skull through the vertex without intravenous contrast. RADIATION DOSE REDUCTION: This exam was performed according to the departmental dose-optimization program which includes automated exposure control, adjustment of the mA and/or kV according to Todd Spears size and/or use of iterative reconstruction technique. COMPARISON:  No priors. FINDINGS: Brain: No evidence of acute infarction, hemorrhage, hydrocephalus, extra-axial collection or mass lesion/mass effect. Vascular: No hyperdense vessel or unexpected calcification. Skull: Normal. Negative for fracture or focal lesion. Sinuses/Orbits: No acute finding. Specifically, globes and retro-orbital soft tissues are grossly normal in appearance. Multifocal mucosal thickening in the frontal, ethmoid and maxillary sinuses bilaterally. Other: Partially calcified soft tissue attenuation lesion in the occipital scalp (axial image 11 of series 2) measuring 1.2 x 1.7 cm, nonspecific, but potentially related to remote trauma. IMPRESSION: 1. No acute intracranial abnormalities. Bilateral globes and retro-orbital soft tissues are grossly normal in appearance. 2. Mild chronic paranasal sinus disease, as above. Electronically Signed   By: Vinnie Langton M.D.   On: 08/07/2021 10:38    Procedures Procedures    Medications Ordered in ED Medications -  No data to display  ED Course/ Medical Decision Making/ A&P Clinical Course as of 08/07/21 1221  Tue Aug 07, 2021  1204 CBC reviewed, interpreted normal C-Met reviewed and interpreted and within normal limits [DR]    Clinical Course User Index [DR] Pattricia Boss, MD                           Medical Decision Making 76 year old Spears with history of previous branch retinal vein occlusion in his right eye presents today with new visual field cut in the left eye.  Todd Spears was seen and evaluated by his optometrist yesterday.  She tested intraocular pressure and did visual fields well as visualize the fundus without any evidence of acute occlusion or branch retinal vein occlusion/hemorrhage noted.  Due to ongoing symptoms Todd Spears was sent to the ED for evaluation.  I discussed his care with the neurologist.  Plan transfer to [facility] so that Todd Spears can have MRI.  If MRI is negative, Todd Spears can be followed up by his optometrist.  Amount and/or Complexity of Data Reviewed Labs: ordered. Radiology: ordered.  Risk Risk Details: Discussed care with neurologist, Dr. Cheral Marker, on-call for hospitalist. Symptoms most consistent with branch retinal artery occlusion or branch retinal vein occlusion.  However, Todd Spears would benefit from MRI to assure that no other acute cerebral stroke has occurred. I have attempted to contact Dr. [place] at office number listed of [phone] which refers to another number which is [phone] which appears to be Dr. Coral Else phone.  This then gives another number to call which is [phone] and no answer was obtained.  I left a message requesting a call back. 12:02 PM Discussed care with Dr. Alethia Berthold.  She states intraocular pressure yesterday was 16.  She did not note any hemorrhage or evidence of retinal artery occlusion on her exam.  There was a pericentral visual field deficit. Plan Todd Spears to go to [facility] for MRI. If MRI is negative Todd Spears can be followed up with Dr. [place].  Critical  Care Total time providing critical care: 30-74 minutes  Final Clinical Impression(s) / ED Diagnoses Final diagnoses:  Visual field cut    Rx / DC Orders ED Discharge Orders     None         Pattricia Boss, MD 08/07/21 1221

## 2021-08-14 ENCOUNTER — Other Ambulatory Visit: Payer: Self-pay | Admitting: Family Medicine

## 2021-08-21 ENCOUNTER — Other Ambulatory Visit: Payer: Self-pay

## 2021-08-21 ENCOUNTER — Ambulatory Visit (AMBULATORY_SURGERY_CENTER): Payer: Medicare HMO | Admitting: *Deleted

## 2021-08-21 VITALS — Ht 67.0 in | Wt 245.0 lb

## 2021-08-21 DIAGNOSIS — Z86010 Personal history of colonic polyps: Secondary | ICD-10-CM

## 2021-08-21 MED ORDER — NA SULFATE-K SULFATE-MG SULF 17.5-3.13-1.6 GM/177ML PO SOLN: 1.0000 | ORAL | 0 refills | Status: DC

## 2021-08-21 NOTE — Progress Notes (Signed)
Patient's pre-visit was done today over the phone with the patient. Name,DOB and address verified. Patient denies any allergies to Eggs and Soy. Patient denies any problems with anesthesia/sedation. Patient is not taking any diet pills or blood thinners. No home Oxygen.  ? ?Prep instructions sent to pt's MyChart-pt aware. Patient understands to call us back with any questions or concerns. Patient is aware of our care-partner policy and HAFBX-03 safety protocol. Patient will use [facility] for suprep rx. ? ?EMMI education assigned to the patient for the procedure, sent to [place].  ? ?The patient is COVID-19 vaccinated.   ?

## 2021-08-28 ENCOUNTER — Ambulatory Visit: Payer: Medicare HMO | Admitting: Podiatry

## 2021-08-28 ENCOUNTER — Encounter: Payer: Self-pay | Admitting: Podiatry

## 2021-08-28 ENCOUNTER — Other Ambulatory Visit: Payer: Self-pay

## 2021-08-28 DIAGNOSIS — M216X9 Other acquired deformities of unspecified foot: Secondary | ICD-10-CM

## 2021-08-28 DIAGNOSIS — B351 Tinea unguium: Secondary | ICD-10-CM

## 2021-08-28 DIAGNOSIS — M79675 Pain in left toe(s): Secondary | ICD-10-CM

## 2021-08-28 DIAGNOSIS — M79674 Pain in right toe(s): Secondary | ICD-10-CM

## 2021-08-28 DIAGNOSIS — Q828 Other specified congenital malformations of skin: Secondary | ICD-10-CM

## 2021-08-28 DIAGNOSIS — N182 Chronic kidney disease, stage 2 (mild): Secondary | ICD-10-CM | POA: Diagnosis not present

## 2021-08-28 NOTE — Progress Notes (Signed)
This patient returns to my office for at risk foot care.  This patient requires this care by a professional since this patient will be at risk due to having chronic kidney disease.  Patient has painful callus on the outside bottom of both feet.  This patient is unable to cut nails himself since the patient cannot reach his nails.These nails are painful walking and wearing shoes. Patient has painful callus on the outside ball both feet.  Patient is unable to self treat.   This patient presents for at risk foot care today.  General Appearance  Alert, conversant and in no acute stress.  Vascular  Dorsalis pedis and posterior tibial  pulses are palpable  bilaterally.  Capillary return is within normal limits  bilaterally. Temperature is within normal limits  bilaterally.  Neurologic  Senn-Weinstein monofilament wire test within normal limits  bilaterally. Muscle power within normal limits bilaterally.  Nails Thick disfigured discolored nails with subungual debris  from hallux to fifth toes bilaterally. No evidence of bacterial infection or drainage bilaterally.  Orthopedic  No limitations of motion  feet .  No crepitus or effusions noted.  No bony pathology or digital deformities noted.  Skin  normotropic skin with no porokeratosis noted bilaterally.  No signs of infections or ulcers noted.  Porokeratosis sub 5th metatarsal  B/L.  Porokeratosis sub 5th metabase right foot.  Onychomycosis  Pain in right toes  Pain in left toes  Porokeratosis B/L.  Consent was obtained for treatment procedures.   Mechanical debridement of nails 1-5  bilaterally performed with a nail nipper.  Filed with dremel without incident. No infection or ulcer.  Debridement of porokeratosis  Sub 5th met  B/L with # 15 blade.   Return office visit     3 months                 Told patient to return for periodic foot care and evaluation due to potential at risk complications.   Meri Pelot DPM  

## 2021-09-03 ENCOUNTER — Encounter: Payer: Self-pay | Admitting: Internal Medicine

## 2021-09-11 ENCOUNTER — Ambulatory Visit (AMBULATORY_SURGERY_CENTER): Payer: Medicare HMO | Admitting: Internal Medicine

## 2021-09-11 ENCOUNTER — Encounter: Payer: Self-pay | Admitting: Internal Medicine

## 2021-09-11 VITALS — BP 111/66 | HR 69 | Temp 98.5°F | Resp 16 | Ht 67.0 in | Wt 245.0 lb

## 2021-09-11 DIAGNOSIS — K635 Polyp of colon: Secondary | ICD-10-CM

## 2021-09-11 DIAGNOSIS — K5140 Inflammatory polyps of colon without complications: Secondary | ICD-10-CM

## 2021-09-11 DIAGNOSIS — D123 Benign neoplasm of transverse colon: Secondary | ICD-10-CM | POA: Diagnosis not present

## 2021-09-11 DIAGNOSIS — Z86010 Personal history of colonic polyps: Secondary | ICD-10-CM | POA: Diagnosis not present

## 2021-09-11 MED ORDER — SODIUM CHLORIDE 0.9 % IV SOLN: 500.0000 mL | Freq: Once | INTRAVENOUS | Status: DC

## 2021-09-11 NOTE — Progress Notes (Signed)
A and O x3. Report to RN. Tolerated MAC anesthesia well. 

## 2021-09-11 NOTE — Op Note (Signed)
[facility] ?Patient Name: Todd Spears ?Procedure Date: 09/11/2021 11:15 AM ?MRN: [id number] ?Endoscopist: Docia Chuck. Henrene Pastor , MD ?Age: 76 ?Referring MD:  ?Date of Birth: December 19, 1945 ?Gender: Male ?Account #: [id number] ?Procedure:                Colonoscopy with cold snare polypectomy x 1; hot  ?                          snare polypectomy x 1 ?Indications:              High risk colon cancer surveillance: Personal  ?                          history of non-advanced adenoma. Previous  ?                          examinations 2006, 2012, 2017 ?Medicines:                Monitored Anesthesia Care ?Procedure:                Pre-Anesthesia Assessment: ?                          - Prior to the procedure, a History and Physical  ?                          was performed, and patient medications and  ?                          allergies were reviewed. The patient's tolerance of  ?                          previous anesthesia was also reviewed. The risks  ?                          and benefits of the procedure and the sedation  ?                          options and risks were discussed with the patient.  ?                          All questions were answered, and informed consent  ?                          was obtained. Prior Anticoagulants: The patient has  ?                          taken no previous anticoagulant or antiplatelet  ?                          agents. ASA Grade Assessment: II - A patient with  ?                          mild systemic disease. After reviewing the risks  ?  and benefits, the patient was deemed in  ?                          satisfactory condition to undergo the procedure. ?                          After obtaining informed consent, the colonoscope  ?                          was passed under direct vision. Throughout the  ?                          procedure, the patient's blood pressure, pulse, and  ?                          oxygen saturations were monitored  continuously. The  ?                          CF [id number] #[id number] was introduced through the anus  ?                          and advanced to the the cecum, identified by  ?                          appendiceal orifice and ileocecal valve. The  ?                          ileocecal valve, appendiceal orifice, and rectum  ?                          were photographed. The quality of the bowel  ?                          preparation was excellent. The colonoscopy was  ?                          performed without difficulty. The patient tolerated  ?                          the procedure well. The bowel preparation used was  ?                          Prepopik via split dose instruction. ?Scope In: 11:29:54 AM ?Scope Out: 11:42:55 AM ?Scope Withdrawal Time: 0 hours 9 minutes 42 seconds  ?Total Procedure Duration: 0 hours 13 minutes 1 second  ?Findings:                 A 2 mm adenomatous appearing polyp was found in the  ?                          transverse colon. The polyp was removed with a cold  ?                          snare. Resection and retrieval were  complete. ?                          A 7 mm inflammatory appearing polyp was found in  ?                          the transverse colon. The polyp was pedunculated.  ?                          The polyp was removed with a hot snare. Resection  ?                          and retrieval were complete. ?                          A single small angiodysplastic lesion without  ?                          bleeding was found in the ascending colon. ?                          A diffuse area of mild melanosis was found in the  ?                          entire colon. ?                          Multiple diverticula were found in the left colon  ?                          and right colon. ?                          Internal hemorrhoids were found during retroflexion. ?                          The exam was otherwise without abnormality on  ?                          direct and  retroflexion views. ?Complications:            No immediate complications. Estimated blood loss:  ?                          None. ?Estimated Blood Loss:     Estimated blood loss: none. ?Impression:               - One 2 mm polyp in the transverse colon, removed  ?                          with a cold snare. Resected and retrieved. ?                          - One 7 mm polyp in the transverse colon, removed  ?  with a hot snare. Resected and retrieved. ?                          - A single non-bleeding colonic angiodysplastic  ?                          lesion. ?                          - Melanosis in the colon. ?                          - Diverticulosis in the left colon and in the right  ?                          colon. ?                          - Internal hemorrhoids. ?                          - The examination was otherwise normal on direct  ?                          and retroflexion views. ?Recommendation:           - Repeat colonoscopy is not recommended for  ?                          surveillance. ?                          - Patient has a contact number available for  ?                          emergencies. The signs and symptoms of potential  ?                          delayed complications were discussed with the  ?                          patient. Return to normal activities tomorrow.  ?                          Written discharge instructions were provided to the  ?                          patient. ?                          - Resume previous diet. ?                          - Continue present medications. ?                          - Await pathology results. ?Docia Chuck. Henrene Pastor, MD ?09/11/2021 11:52:01 AM ?This report has been signed electronically. ?

## 2021-09-11 NOTE — Progress Notes (Signed)
Per Dr. Henrene Pastor, it is ok for pt to continue asa 81 mg.  Pt had one transverse polyp removed hot snare. Maw ? ? ?

## 2021-09-11 NOTE — Progress Notes (Signed)
Called to room to assist during endoscopic procedure.  Patient ID and intended procedure confirmed with present staff. Received instructions for my participation in the procedure from the performing physician.  

## 2021-09-11 NOTE — Progress Notes (Signed)
Pt's states no medical or surgical changes since previsit or office visit. 

## 2021-09-11 NOTE — Patient Instructions (Addendum)
Handouts were given to your care partner on polyps, diverticulosis, and hemorrhoids. ?You may resume your current medications today. ?Await biopsy results.  May take 1-3 weeks to receive pathology results. ?Repeat colonoscopy is not recommended per Dr. Henrene Pastor. ?Please call if any questions or concerns. ?  ? ? ? ?YOU HAD AN ENDOSCOPIC PROCEDURE TODAY AT [facility]:   Refer to the procedure report that was given to you for any specific questions about what was found during the examination.  If the procedure report does not answer your questions, please call your gastroenterologist to clarify.  If you requested that your care partner not be given the details of your procedure findings, then the procedure report has been included in a sealed envelope for you to review at your convenience later. ? ?YOU SHOULD EXPECT: Some feelings of bloating in the abdomen. Passage of more gas than usual.  Walking can help get rid of the air that was put into your GI tract during the procedure and reduce the bloating. If you had a lower endoscopy (such as a colonoscopy or flexible sigmoidoscopy) you may notice spotting of blood in your stool or on the toilet paper. If you underwent a bowel prep for your procedure, you may not have a normal bowel movement for a few days. ? ?Please Note:  You might notice some irritation and congestion in your nose or some drainage.  This is from the oxygen used during your procedure.  There is no need for concern and it should clear up in a day or so. ? ?SYMPTOMS TO REPORT IMMEDIATELY: ? ?Following lower endoscopy (colonoscopy or flexible sigmoidoscopy): ? Excessive amounts of blood in the stool ? Significant tenderness or worsening of abdominal pains ? Swelling of the abdomen that is new, acute ? Fever of 100?F or higher ? ? ?For urgent or emergent issues, a gastroenterologist can be reached at any hour by calling [phone]. ?Do not use MyChart messaging for urgent concerns.   ? ? ?DIET:  We do recommend a small meal at first, but then you may proceed to your regular diet.  Drink plenty of fluids but you should avoid alcoholic beverages for 24 hours. ? ?ACTIVITY:  You should plan to take it easy for the rest of today and you should NOT DRIVE or use heavy machinery until tomorrow (because of the sedation medicines used during the test).   ? ?FOLLOW UP: ?Our staff will call the number listed on your records 48-72 hours following your procedure to check on you and address any questions or concerns that you may have regarding the information given to you following your procedure. If we do not reach you, we will leave a message.  We will attempt to reach you two times.  During this call, we will ask if you have developed any symptoms of COVID 19. If you develop any symptoms (ie: fever, flu-like symptoms, shortness of breath, cough etc.) before then, please call [phone].  If you test positive for Covid 19 in the 2 weeks post procedure, please call and report this information to [place].   ? ?If any biopsies were taken you will be contacted by phone or by letter within the next 1-3 weeks.  Please call us at [phone] if you have not heard about the biopsies in 3 weeks.  ? ? ?SIGNATURES/CONFIDENTIALITY: ?You and/or your care partner have signed paperwork which will be entered into your electronic medical record.  These signatures attest to the fact  that that the information above on your After Visit Summary has been reviewed and is understood.  Full responsibility of the confidentiality of this discharge information lies with you and/or your care-partner.  ?

## 2021-09-13 ENCOUNTER — Encounter: Payer: Self-pay | Admitting: Internal Medicine

## 2021-09-13 ENCOUNTER — Telehealth: Payer: Self-pay

## 2021-09-13 NOTE — Telephone Encounter (Signed)
?  Follow up Call- ? ? ?  09/11/2021  ? 10:14 AM  ?Call back number  ?Post procedure Call Back phone  # [phone]  ?Permission to leave phone message Yes  ?  ? ?Patient questions: ? ?Do you have a fever, pain , or abdominal swelling? No. ?Pain Score  0 * ? ?Have you tolerated food without any problems? Yes ? ?Have you been able to return to your normal activities? Yes.   ? ?Do you have any questions about your discharge instructions: ?Diet   No. ?Medications  No. ?Follow up visit  No. ? ?Do you have questions or concerns about your Care? No. ? ?Actions: ?* If pain score is 4 or above: ?No action needed, pain <4. ? ? ?

## 2021-09-28 ENCOUNTER — Other Ambulatory Visit: Payer: Self-pay | Admitting: Family Medicine

## 2021-09-28 DIAGNOSIS — J302 Other seasonal allergic rhinitis: Secondary | ICD-10-CM

## 2021-10-02 ENCOUNTER — Other Ambulatory Visit: Payer: Self-pay | Admitting: Family Medicine

## 2021-10-03 DIAGNOSIS — H401121 Primary open-angle glaucoma, left eye, mild stage: Secondary | ICD-10-CM | POA: Diagnosis not present

## 2021-10-24 ENCOUNTER — Other Ambulatory Visit: Payer: Self-pay | Admitting: Family Medicine

## 2021-10-24 DIAGNOSIS — J302 Other seasonal allergic rhinitis: Secondary | ICD-10-CM

## 2021-11-12 ENCOUNTER — Encounter: Payer: Self-pay | Admitting: Family

## 2021-11-12 ENCOUNTER — Ambulatory Visit (INDEPENDENT_AMBULATORY_CARE_PROVIDER_SITE_OTHER): Payer: Medicare HMO | Admitting: Family

## 2021-11-12 VITALS — BP 177/75 | HR 68 | Temp 98.2°F | Ht 67.0 in | Wt 249.1 lb

## 2021-11-12 DIAGNOSIS — H6123 Impacted cerumen, bilateral: Secondary | ICD-10-CM

## 2021-11-12 DIAGNOSIS — R030 Elevated blood-pressure reading, without diagnosis of hypertension: Secondary | ICD-10-CM | POA: Diagnosis not present

## 2021-11-12 NOTE — Patient Instructions (Addendum)
It was very nice to see you today!  We were successful in cleaning out the wax from your ears. It is normal to feel a little soreness for a few hours.  Your blood pressure is elevated today. Be sure you are taking your Amlodipine, Lisinopril, and Metoprolol daily as directed. Drink 2 liters of water daily, and watch the salt in your diet - no fast food, canned or frozen dinners.   Let us know if any other concerns.    PLEASE NOTE:  If you had any lab tests please let us know if you have not heard back within a few days. You may see your results on MyChart before we have a chance to review them but we will give you a call once they are reviewed by [place]. If we ordered any referrals today, please let us know if you have not heard from their office within the next week.

## 2021-11-12 NOTE — Progress Notes (Signed)
Subjective:     Patient ID: Todd Spears, male    DOB: 1946/05/18, 76 y.o.   MRN: [id number]  Chief Complaint  Patient presents with   Cerumen Impaction    HPI: Ear pain - pt describes a 1 week history of Bilateral ear fullness with mild pressure and decreased hearing. Denies associated pain, discharge or dizziness.   Assessment & Plan:   Problem List Items Addressed This Visit   None Visit Diagnoses     Bilateral impacted cerumen    -  Primary Verbal consent received to perform bilateral ear lavage via Hydrogen peroxide/water mix solution. Pt tolerated well, complete evacuation of all cerumen obtained. Mild erythema but no bleeding noted in ear canals after procedure.     Relevant Orders   Ear Lavage   Elevated blood pressure reading    - pt with noted HTN, elevated BP today, asymptomatic, on several medications, followed by Cardiology.  Advised on proper daily water hydration, low sodium diet.      Outpatient Medications Prior to Visit  Medication Sig Dispense Refill   albuterol (VENTOLIN HFA) 108 (90 Base) MCG/ACT inhaler INHALE 2 PUFFS INTO LUNGS EVERY 6 HOURS AS NEEDED FOR SHORTNESS OF BREATH 8.5 each 3   amLODipine (NORVASC) 10 MG tablet TAKE 1 TABLET BY MOUTH EVERY DAY 90 tablet 2   aspirin 81 MG chewable tablet Chew 81 mg by mouth daily.     Azelastine HCl 137 MCG/SPRAY SOLN INSTILL 2 SPRAYS INTO BOTH NOSTRILS 2 TIMES DAILY AS DIRECTED 30 mL 1   cetirizine (ZYRTEC) 10 MG tablet Take 10 mg by mouth every morning.     Cyanocobalamin (VITAMIN B 12 PO) Take 1 tablet by mouth daily.      diclofenac sodium (VOLTAREN) 1 % GEL Apply 2 g topically 4 (four) times daily. 100 g 3   esomeprazole (NEXIUM) 40 MG capsule TAKE 1 CAPSULE BY MOUTH DAILY BEFORE MEALS 90 capsule 2   ezetimibe (ZETIA) 10 MG tablet Take 1 tablet (10 mg total) by mouth daily. 90 tablet 2   FeFum-FePoly-FA-B Cmp-C-Biot (INTEGRA PLUS) CAPS TAKE 1 CAPSULE BY MOUTH EVERY DAY 90 capsule 3   hydrALAZINE  (APRESOLINE) 50 MG tablet TAKE 2 TABLETS BY MOUTH 3 TIMES A DAY 540 tablet 3   lisinopril (ZESTRIL) 40 MG tablet TAKE 1 TABLET BY MOUTH EVERY DAY 90 tablet 1   loratadine (CLARITIN) 10 MG tablet Take 10 mg by mouth at bedtime.      LUMIGAN 0.01 % SOLN Place 1 drop into the left eye at bedtime.     metoprolol succinate (TOPROL-XL) 50 MG 24 hr tablet TAKE 1 TABLET EVERY DAY WITH OR IMMEDIATELY FOLLOWING A MEAL 90 tablet 2   montelukast (SINGULAIR) 10 MG tablet TAKE 1 TABLET BY MOUTH EVERYDAY AT BEDTIME 90 tablet 3   Multiple Vitamin (MULTIVITAMIN WITH MINERALS) TABS tablet Take 1 tablet by mouth daily.     nitroGLYCERIN (NITROSTAT) 0.4 MG SL tablet Place 1 tablet (0.4 mg total) under the tongue every 5 (five) minutes as needed for chest pain (up to 3 doses. not within 24hrs of Viagra). 25 tablet 6   pravastatin (PRAVACHOL) 10 MG tablet TAKE 1 TABLET EVERY OTHER DAY, ALTERNATING WITH 2 TABLETS. 135 tablet 1   traMADol (ULTRAM) 50 MG tablet TAKE 1 TABLET (50 MG TOTAL) BY MOUTH EVERY 12 (TWELVE) HOURS. 60 tablet 5   Facility-Administered Medications Prior to Visit  Medication Dose Route Frequency Provider Last Rate Last Admin  0.9 %  sodium chloride infusion  500 mL Intravenous Once Irene Shipper, MD        Past Medical History:  Diagnosis Date   Allergic rhinitis    Anemia    Asthma    CAD (coronary artery disease)    a. s/p PTCA/stenting of the mid LAD and mid RCA 04/06/12   Carotid bruit    r   Colon polyp    Diverticulosis    ED (erectile dysfunction)    Fatty liver    GERD (gastroesophageal reflux disease)    Gout    Hyperlipidemia    Hypertension    Obesity    Osteoarthritis    Renal insufficiency    Restless leg syndrome     Past Surgical History:  Procedure Laterality Date   BACK SURGERY  2020   COLONOSCOPY  04/23/2016   Dr.Perry   INGUINAL HERNIA REPAIR Right 02/27/2016   Procedure: HERNIA REPAIR INGUINAL ADULT;  Surgeon: Rolm Bookbinder, MD;  Location: [facility];  Service: General;  Laterality: Right;   INSERTION OF MESH Right 02/27/2016   Procedure: INSERTION OF MESH--IN AND OUT CATHETERIZATION PERFORMED AT END OF PROCEDURE TO DRAIN 400ML URINE.;  Surgeon: Rolm Bookbinder, MD;  Location: [facility];  Service: General;  Laterality: Right;   LYMPH NODE BIOPSY     right side of neck 1999 - benign   PERCUTANEOUS CORONARY STENT INTERVENTION (PCI-S) N/A 04/06/2012   Procedure: PERCUTANEOUS CORONARY STENT INTERVENTION (PCI-S);  Surgeon: Peter M Martinique, MD; Promus DES for 90% mid LAD and 95% mid RCA disease     UPPER GASTROINTESTINAL ENDOSCOPY  04/23/2016   Dr.Perry    Allergies  Allergen Reactions   Prednisone Shortness Of Breath and Swelling   Crestor [Rosuvastatin Calcium] Other (See Comments)    myalgia   Lyrica [Pregabalin]     "swelling"       Objective:    Physical Exam Vitals and nursing note reviewed.  Constitutional:      General: He is not in acute distress.    Appearance: Normal appearance.  HENT:     Head: Normocephalic.     Right Ear: There is impacted cerumen.     Left Ear: There is impacted cerumen.  Cardiovascular:     Rate and Rhythm: Normal rate and regular rhythm.  Pulmonary:     Effort: Pulmonary effort is normal.     Breath sounds: Normal breath sounds.  Musculoskeletal:        General: Normal range of motion.     Cervical back: Normal range of motion.  Skin:    General: Skin is warm and dry.  Neurological:     Mental Status: He is alert and oriented to person, place, and time.  Psychiatric:        Mood and Affect: Mood normal.     BP (!) 177/75 (BP Location: Left Arm, Patient Position: Sitting, Cuff Size: Large)   Pulse 68   Temp 98.2 F (36.8 C) (Temporal)   Ht '5\' 7"'$  (1.702 m)   Wt 249 lb 2 oz (113 kg)   SpO2 98%   BMI 39.02 kg/m  Wt Readings from Last 3 Encounters:  11/12/21 249 lb 2 oz (113 kg)  09/11/21 245 lb (111.1 kg)  08/21/21 245 lb (111.1 kg)        No  orders of the defined types were placed in this encounter.   Jeanie Sewer, NP

## 2021-11-20 ENCOUNTER — Other Ambulatory Visit: Payer: Self-pay | Admitting: Family Medicine

## 2021-11-20 DIAGNOSIS — J302 Other seasonal allergic rhinitis: Secondary | ICD-10-CM

## 2021-11-26 ENCOUNTER — Ambulatory Visit: Payer: Medicare HMO | Admitting: Podiatry

## 2021-12-10 ENCOUNTER — Encounter: Payer: Self-pay | Admitting: Podiatry

## 2021-12-10 ENCOUNTER — Ambulatory Visit: Payer: Medicare HMO | Admitting: Podiatry

## 2021-12-10 DIAGNOSIS — M79674 Pain in right toe(s): Secondary | ICD-10-CM

## 2021-12-10 DIAGNOSIS — M216X9 Other acquired deformities of unspecified foot: Secondary | ICD-10-CM

## 2021-12-10 DIAGNOSIS — Q828 Other specified congenital malformations of skin: Secondary | ICD-10-CM

## 2021-12-10 DIAGNOSIS — M79675 Pain in left toe(s): Secondary | ICD-10-CM | POA: Diagnosis not present

## 2021-12-10 DIAGNOSIS — N182 Chronic kidney disease, stage 2 (mild): Secondary | ICD-10-CM

## 2021-12-10 DIAGNOSIS — B351 Tinea unguium: Secondary | ICD-10-CM | POA: Diagnosis not present

## 2021-12-10 NOTE — Progress Notes (Signed)
This patient returns to my office for at risk foot care.  This patient requires this care by a professional since this patient will be at risk due to having chronic kidney disease.  Patient has painful callus on the outside bottom of both feet.  This patient is unable to cut nails himself since the patient cannot reach his nails.These nails are painful walking and wearing shoes. Patient has painful callus on the outside ball both feet.  Patient is unable to self treat.   This patient presents for at risk foot care today.  General Appearance  Alert, conversant and in no acute stress.  Vascular  Dorsalis pedis and posterior tibial  pulses are palpable  bilaterally.  Capillary return is within normal limits  bilaterally. Temperature is within normal limits  bilaterally.  Neurologic  Senn-Weinstein monofilament wire test within normal limits  bilaterally. Muscle power within normal limits bilaterally.  Nails Thick disfigured discolored nails with subungual debris  from hallux to fifth toes bilaterally. No evidence of bacterial infection or drainage bilaterally.  Orthopedic  No limitations of motion  feet .  No crepitus or effusions noted.  No bony pathology or digital deformities noted.  Skin  normotropic skin with no porokeratosis noted bilaterally.  No signs of infections or ulcers noted.    Onychomycosis  Pain in right toes  Pain in left foot   Consent was obtained for treatment procedures.   Mechanical debridement of nails 1-5  bilaterally performed with a nail nipper.  Filed with dremel without incident. No infection or ulcer.  .   Return office visit     3 months                 Told patient to return for periodic foot care and evaluation due to potential at risk complications.   Dalena Plantz DPM  

## 2021-12-11 ENCOUNTER — Ambulatory Visit (INDEPENDENT_AMBULATORY_CARE_PROVIDER_SITE_OTHER): Payer: Medicare HMO | Admitting: Family Medicine

## 2021-12-11 ENCOUNTER — Encounter: Payer: Self-pay | Admitting: Family Medicine

## 2021-12-11 ENCOUNTER — Telehealth: Payer: Self-pay | Admitting: Family Medicine

## 2021-12-11 VITALS — BP 140/60 | HR 72 | Temp 98.1°F | Ht 67.0 in | Wt 247.4 lb

## 2021-12-11 DIAGNOSIS — L723 Sebaceous cyst: Secondary | ICD-10-CM

## 2021-12-11 DIAGNOSIS — R739 Hyperglycemia, unspecified: Secondary | ICD-10-CM

## 2021-12-11 DIAGNOSIS — I10 Essential (primary) hypertension: Secondary | ICD-10-CM

## 2021-12-11 NOTE — Telephone Encounter (Signed)
Pt is needing to come back to have packing removed from his cyst. He is asking if he can come in on Thursday or Friday by 8am or earlier. [facility] is completely booked. Please advise

## 2021-12-11 NOTE — Patient Instructions (Signed)
It was very nice to see you today!  You had a cyst on your scalp.  We drained and removed this yesterday.  Please come back later this week to have the packing removed.  Take care, Dr Jerline Pain  PLEASE NOTE:  If you had any lab tests please let us know if you have not heard back within a few days. You may see your results on mychart before we have a chance to review them but we will give you a call once they are reviewed by [place]. If we ordered any referrals today, please let us know if you have not heard from their office within the next week.   Please try these tips to maintain a healthy lifestyle:  Eat at least 3 REAL meals and 1-2 snacks per day.  Aim for no more than 5 hours between eating.  If you eat breakfast, please do so within one hour of getting up.   Each meal should contain half fruits/vegetables, one quarter protein, and one quarter carbs (no bigger than a computer mouse)  Cut down on sweet beverages. This includes juice, soda, and sweet tea.   Drink at least 1 glass of water with each meal and aim for at least 8 glasses per day  Exercise at least 150 minutes every week.    Incision and Drainage, Care After This sheet gives you information about how to care for yourself after your procedure. Your health care provider may also give you more specific instructions. If you have problems or questions, contact your health care provider. What can I expect after the procedure? After the procedure, it is common to have: Pain or discomfort around the incision site. Blood, fluid, or pus (drainage) from the incision. Redness and firm skin around the incision site. Follow these instructions at home: Medicines Take over-the-counter and prescription medicines only as told by your health care provider. If you were prescribed an antibiotic medicine, use or take it as told by your health care provider. Do not stop using the antibiotic even if you start to feel better. Wound care Follow  instructions from your health care provider about how to take care of your wound. Make sure you: Wash your hands with soap and water before and after you change your bandage (dressing). If soap and water are not available, use hand sanitizer. Change your dressing and packing as told by your health care provider. If your dressing is dry or stuck when you try to remove it, moisten or wet the dressing with saline or water so that it can be removed without harming your skin or tissues. If your wound is packed, leave it in place until your health care provider tells you to remove it. To remove the packing, moisten or wet the packing with saline or water so that it can be removed without harming your skin or tissues. Leave stitches (sutures), skin glue, or adhesive strips in place. These skin closures may need to stay in place for 2 weeks or longer. If adhesive strip edges start to loosen and curl up, you may trim the loose edges. Do not remove adhesive strips completely unless your health care provider tells you to do that. Check your wound every day for signs of infection. Check for: More redness, swelling, or pain. More fluid or blood. Warmth. Pus or a bad smell. If you were sent home with a drain tube in place, follow instructions from your health care provider about: How to empty it. How to care  for it at home.  General instructions Rest the affected area. Do not take baths, swim, or use a hot tub until your health care provider approves. Ask your health care provider if you may take showers. You may only be allowed to take sponge baths. Return to your normal activities as told by your health care provider. Ask your health care provider what activities are safe for you. Your health care provider may put you on activity or lifting restrictions. The incision will continue to drain. It is normal to have some clear or slightly bloody drainage. The amount of drainage should lessen each day. Do not  apply any creams, ointments, or liquids unless you have been told to by your health care provider. Keep all follow-up visits as told by your health care provider. This is important. Contact a health care provider if: Your cyst or abscess returns. You have more redness, swelling, or pain around your incision. You have more fluid or blood coming from your incision. Your incision feels warm to the touch. You have pus or a bad smell coming from your incision. You have red streaks above or below the incision site. Get help right away if: You have severe pain or bleeding. You cannot eat or drink without vomiting. You have a fever or chills. You have redness that spreads quickly. You have decreased urine output. You become short of breath. You have chest pain. You cough up blood. The affected area becomes numb or starts to tingle. These symptoms may represent a serious problem that is an emergency. Do not wait to see if the symptoms will go away. Get medical help right away. Call your local emergency services (911 in the [place] Do not drive yourself to the hospital. Summary After this procedure, it is common to have fluid, blood, or pus coming from the surgery site. Follow all home care instructions. You will be told how to take care of your incision, how to check for infection, and how to take medicines. If you were prescribed an antibiotic medicine, take it as told by your health care provider. Do not stop taking the antibiotic even if you start to feel better. Contact a health care provider if you have increased redness, swelling, or pain around your incision. Get help right away if you have chest pain, you vomit, you cough up blood, or you have shortness of breath. Keep all follow-up visits as told by your health care provider. This is important. This information is not intended to replace advice given to you by your health care provider. Make sure you discuss any questions you have with your  health care provider. Document Revised: 03/01/2021 Document Reviewed: 03/01/2021 Elsevier Patient Education  [place].

## 2021-12-11 NOTE — Progress Notes (Signed)
   Todd Spears is a 76 y.o. male who presents today for an office visit.  Assessment/Plan:  New/Acute Problems: Inflamed sebaceous cyst I&D performed today.  He tolerated well.  He will come back later this week to have packing removed.  No signs of infection.  We discussed reasons to return to care care after.  Chronic Problems Addressed Today: Essential hypertension Blood pressure at goal on current regimen Norvasc 10 mg daily, lisinopril 40 mg daily, metoprolol succinate 50 mg daily, and hydralazine 100 mg 3 times daily.  Hyperglycemia Last A1c 6.2.  Continue lifestyle modifications.    Subjective:  HPI:  See A/p for status of chronic conditions.  He is concerned about a growing spot on the back of his neck. This has been present for years but seems to be getting worse over the last couple of weeks.  Some pain.  No drainage.  No injuries or other precipitating events.       Objective:  Physical Exam: BP 140/60   Pulse 72   Temp 98.1 F (36.7 C) (Temporal)   Ht '5\' 7"'$  (1.702 m)   Wt 247 lb 6.4 oz (112.2 kg)   SpO2 99%   BMI 38.75 kg/m   Gen: No acute distress, resting comfortably CV: Regular rate and rhythm with no murmurs appreciated Pulm: Normal work of breathing, clear to auscultation bilaterally with no crackles, wheezes, or rhonchi Skin: Approximately 2 cm fluctuant erythematous mass on posterior scalp Neuro: Grossly normal, moves all extremities Psych: Normal affect and thought content  Sebaceous Cyst I&D with Excision Procedure Note  Pre-operative Diagnosis: Inflamed sebaceous cyst  Post-operative Diagnosis: Same  Locations: scalp  Indications: Diagnostic and therapeutic  Anesthesia: Lidocaine 1% with epinephrine without added sodium bicarbonate  Procedure Details  History of allergy to iodine: No  Patient informed of the risks (including bleeding and infection) and benefits of the  procedure and Written informed consent obtained.  The lesion  and surrounding area was given a sterile prep using betadyne and draped in the usual sterile fashion.  A 4 mm punch biopsy was used to penetrate the lesion.  Typical sebaceous material was then expressed from the incision site.  After successful expression of material, a hemostat was then inserted into the incision to break up any loculations. A hemostat was then used to gently remove the cyst wall from the incision.  The cavity was packed with iodoform gauze.  Antibiotic ointment and a sterile dressing applied.  The specimen was not sent for pathologic examination. The patient tolerated the procedure well.  EBL: 2 ml  Findings: N/A  Condition: Stable  Complications: none.  Plan: 1. Instructed to keep the wound dry and covered for 24-48h and clean thereafter. 2. Warning signs of infection were reviewed.   3. Recommended that the patient use OTC analgesics as needed for pain.       [name]. Jerline Pain, MD 12/11/2021 12:49 PM

## 2021-12-11 NOTE — Telephone Encounter (Signed)
See note

## 2021-12-11 NOTE — Assessment & Plan Note (Signed)
Last A1c 6.2.  Continue lifestyle modifications.

## 2021-12-11 NOTE — Assessment & Plan Note (Signed)
Blood pressure at goal on current regimen Norvasc 10 mg daily, lisinopril 40 mg daily, metoprolol succinate 50 mg daily, and hydralazine 100 mg 3 times daily.

## 2021-12-12 NOTE — Telephone Encounter (Signed)
FYI  Patient has been called and will be scheduled for 12/13/21 at 8 am for packing removal.

## 2021-12-12 NOTE — Telephone Encounter (Signed)
Should be a quick visit. Ok to double book him at 8am for either day.  [name]. Jerline Pain, MD 12/12/2021 8:07 AM

## 2021-12-12 NOTE — Telephone Encounter (Signed)
See note

## 2021-12-13 ENCOUNTER — Ambulatory Visit (INDEPENDENT_AMBULATORY_CARE_PROVIDER_SITE_OTHER): Payer: Medicare HMO | Admitting: Family Medicine

## 2021-12-13 ENCOUNTER — Encounter: Payer: Self-pay | Admitting: Family Medicine

## 2021-12-13 VITALS — BP 138/70 | HR 65 | Temp 98.1°F | Ht 67.0 in | Wt 246.0 lb

## 2021-12-13 DIAGNOSIS — L723 Sebaceous cyst: Secondary | ICD-10-CM

## 2021-12-13 NOTE — Patient Instructions (Signed)
It was very nice to see you today!  We removed your packing today.  Please keep the area clean and covered until it fully heals.  Let us know if you have any worsening pain, redness, or pus.  Take care, Dr Jerline Pain  PLEASE NOTE:  If you had any lab tests please let us know if you have not heard back within a few days. You may see your results on mychart before we have a chance to review them but we will give you a call once they are reviewed by [place]. If we ordered any referrals today, please let us know if you have not heard from their office within the next week.   Please try these tips to maintain a healthy lifestyle:  Eat at least 3 REAL meals and 1-2 snacks per day.  Aim for no more than 5 hours between eating.  If you eat breakfast, please do so within one hour of getting up.   Each meal should contain half fruits/vegetables, one quarter protein, and one quarter carbs (no bigger than a computer mouse)  Cut down on sweet beverages. This includes juice, soda, and sweet tea.   Drink at least 1 glass of water with each meal and aim for at least 8 glasses per day  Exercise at least 150 minutes every week.    Incision and Drainage, Care After This sheet gives you information about how to care for yourself after your procedure. Your health care provider may also give you more specific instructions. If you have problems or questions, contact your health care provider. What can I expect after the procedure? After the procedure, it is common to have: Pain or discomfort around the incision site. Blood, fluid, or pus (drainage) from the incision. Redness and firm skin around the incision site. Follow these instructions at home: Medicines Take over-the-counter and prescription medicines only as told by your health care provider. If you were prescribed an antibiotic medicine, use or take it as told by your health care provider. Do not stop using the antibiotic even if you start to feel  better. Wound care Follow instructions from your health care provider about how to take care of your wound. Make sure you: Wash your hands with soap and water before and after you change your bandage (dressing). If soap and water are not available, use hand sanitizer. Change your dressing and packing as told by your health care provider. If your dressing is dry or stuck when you try to remove it, moisten or wet the dressing with saline or water so that it can be removed without harming your skin or tissues. If your wound is packed, leave it in place until your health care provider tells you to remove it. To remove the packing, moisten or wet the packing with saline or water so that it can be removed without harming your skin or tissues. Leave stitches (sutures), skin glue, or adhesive strips in place. These skin closures may need to stay in place for 2 weeks or longer. If adhesive strip edges start to loosen and curl up, you may trim the loose edges. Do not remove adhesive strips completely unless your health care provider tells you to do that. Check your wound every day for signs of infection. Check for: More redness, swelling, or pain. More fluid or blood. Warmth. Pus or a bad smell. If you were sent home with a drain tube in place, follow instructions from your health care provider about: How to empty  it. How to care for it at home.  General instructions Rest the affected area. Do not take baths, swim, or use a hot tub until your health care provider approves. Ask your health care provider if you may take showers. You may only be allowed to take sponge baths. Return to your normal activities as told by your health care provider. Ask your health care provider what activities are safe for you. Your health care provider may put you on activity or lifting restrictions. The incision will continue to drain. It is normal to have some clear or slightly bloody drainage. The amount of drainage should  lessen each day. Do not apply any creams, ointments, or liquids unless you have been told to by your health care provider. Keep all follow-up visits as told by your health care provider. This is important. Contact a health care provider if: Your cyst or abscess returns. You have more redness, swelling, or pain around your incision. You have more fluid or blood coming from your incision. Your incision feels warm to the touch. You have pus or a bad smell coming from your incision. You have red streaks above or below the incision site. Get help right away if: You have severe pain or bleeding. You cannot eat or drink without vomiting. You have a fever or chills. You have redness that spreads quickly. You have decreased urine output. You become short of breath. You have chest pain. You cough up blood. The affected area becomes numb or starts to tingle. These symptoms may represent a serious problem that is an emergency. Do not wait to see if the symptoms will go away. Get medical help right away. Call your local emergency services (911 in the [place] Do not drive yourself to the hospital. Summary After this procedure, it is common to have fluid, blood, or pus coming from the surgery site. Follow all home care instructions. You will be told how to take care of your incision, how to check for infection, and how to take medicines. If you were prescribed an antibiotic medicine, take it as told by your health care provider. Do not stop taking the antibiotic even if you start to feel better. Contact a health care provider if you have increased redness, swelling, or pain around your incision. Get help right away if you have chest pain, you vomit, you cough up blood, or you have shortness of breath. Keep all follow-up visits as told by your health care provider. This is important. This information is not intended to replace advice given to you by your health care provider. Make sure you discuss any  questions you have with your health care provider. Document Revised: 03/01/2021 Document Reviewed: 03/01/2021 Elsevier Patient Education  [place].

## 2021-12-13 NOTE — Progress Notes (Signed)
   Todd Spears is a 76 y.o. male who presents today for an office visit.  Assessment/Plan:  Inflamed Sebaceous Cyst Packing removed today.  See below procedure note.  He tolerated well.  No signs of infection.  He can resume with normal postprocedure wound care.  We discussed this in the office and handout was given.  We discussed reasons to return to care.  Follow-up as needed.     Subjective:  HPI:  Patient is here today for I&D follow-up.  Had I&D of sebaceous cyst on posterior scalp 2 days ago.  Tolerated procedure well.  Packing was placed. He is here today to have this removed.  Spears has been well controlled.       Objective:  Physical Exam: BP 138/70   Pulse 65   Temp 98.1 F (36.7 C) (Temporal)   Ht '5\' 7"'$  (1.702 m)   Wt 246 lb (111.6 kg)   SpO2 99%   BMI 38.53 kg/m   Gen: No acute distress, resting comfortably Skin: Approximately 1 cm cystic lesion on posterior scalp. PAcking place Neuro: Grossly normal, moves all extremities Psych: Normal affect and thought content  Procedure Note:  Verbal consent obtained.  Packing was removed without difficulty.  The cystic lesion was then expressed with no significant discharge.      Todd Spears. Todd Pain, MD 12/13/2021 8:13 AM

## 2021-12-25 ENCOUNTER — Other Ambulatory Visit: Payer: Self-pay | Admitting: Cardiology

## 2022-01-09 DIAGNOSIS — H401131 Primary open-angle glaucoma, bilateral, mild stage: Secondary | ICD-10-CM | POA: Diagnosis not present

## 2022-01-15 ENCOUNTER — Other Ambulatory Visit: Payer: Self-pay | Admitting: Family Medicine

## 2022-01-30 ENCOUNTER — Other Ambulatory Visit: Payer: Self-pay | Admitting: Family Medicine

## 2022-02-05 ENCOUNTER — Other Ambulatory Visit: Payer: Self-pay | Admitting: Family Medicine

## 2022-02-05 NOTE — Progress Notes (Signed)
Todd Spears Date of Birth: February 17, 1946 Medical Record #[id number]  History of Present Illness: Todd Spears is seen back today for follow up CAD. He is s/p 2 vessel PCI with Promus DES for 90% mid LAD and 95% mid RCA disease on 04/06/2012. His EF is normal at 55%. At that time he really had no significant anginal symptoms but had an abnormal stress test.  Myoview in Nov. 2014 showed a fixed inferior defect without ischemia. Last Myoview in Feb. 2017 was unchanged. Other issues include CKD stage 3, HTN, obesity, gout,,OA, ED, carotid bruit and asthma.   He was admitted in January 2017 with tremors and hyponatremia. Chlorthalidone was discontinued.   He had carotid dopplers showing 1-39% RICA and 17-40% LICA stenoses. Echo done in March 2017 showed normal LV function and mild AS unchanged from 2013. He has been followed in our lipid clinic for hypercholesterolemia with prior intolerance to statins. He is now on pravastatin and Zetia.   On follow up today he is doing well. He denies any chest pain or SOB. No TIA or CVA symptoms. He is still working as a  Engineer, building services. BP at home is variable 814-481 systolic. Still struggles with weight.   Allergies as of 02/07/2022       Reactions   Prednisone Shortness Of Breath, Swelling   Crestor [rosuvastatin Calcium] Other (See Comments)   myalgia   Lyrica [pregabalin]    "swelling"        Medication List        Accurate as of February 07, 2022  2:52 PM. If you have any questions, ask your nurse or doctor.          albuterol 108 (90 Base) MCG/ACT inhaler Commonly known as: VENTOLIN HFA INHALE 2 PUFFS INTO LUNGS EVERY 6 HOURS AS NEEDED FOR SHORTNESS OF BREATH   amLODipine 10 MG tablet Commonly known as: NORVASC TAKE 1 TABLET BY MOUTH EVERY DAY   aspirin 81 MG chewable tablet Chew 81 mg by mouth daily.   Azelastine HCl 137 MCG/SPRAY Soln INSTILL 2 SPRAYS INTO BOTH NOSTRILS 2 TIMES DAILY AS DIRECTED   cetirizine 10 MG tablet Commonly known  as: ZYRTEC Take 10 mg by mouth every morning.   diclofenac sodium 1 % Gel Commonly known as: VOLTAREN Apply 2 g topically 4 (four) times daily.   esomeprazole 40 MG capsule Commonly known as: NEXIUM TAKE ONE CAPSULE BY MOUTH DAILY BEFORE MEALS   ezetimibe 10 MG tablet Commonly known as: ZETIA TAKE 1 TABLET BY MOUTH EVERY DAY   hydrALAZINE 50 MG tablet Commonly known as: APRESOLINE TAKE 2 TABLETS BY MOUTH 3 TIMES A DAY   Integra Plus Caps TAKE 1 CAPSULE BY MOUTH EVERY DAY   lisinopril 40 MG tablet Commonly known as: ZESTRIL TAKE 1 TABLET BY MOUTH EVERY DAY   loratadine 10 MG tablet Commonly known as: CLARITIN Take 10 mg by mouth at bedtime.   Lumigan 0.01 % Soln Generic drug: bimatoprost Place 1 drop into the left eye at bedtime.   metoprolol succinate 50 MG 24 hr tablet Commonly known as: TOPROL-XL TAKE 1 TABLET EVERY DAY WITH OR IMMEDIATELY FOLLOWING A MEAL   montelukast 10 MG tablet Commonly known as: SINGULAIR TAKE 1 TABLET BY MOUTH EVERYDAY AT BEDTIME   multivitamin with minerals Tabs tablet Take 1 tablet by mouth daily.   nitroGLYCERIN 0.4 MG SL tablet Commonly known as: Nitrostat Place 1 tablet (0.4 mg total) under the tongue every 5 (five) minutes as needed for  chest pain (up to 3 doses. not within 24hrs of Viagra).   pravastatin 10 MG tablet Commonly known as: PRAVACHOL TAKE 1 TABLET EVERY OTHER DAY, ALTERNATING WITH 2 TABLETS.   traMADol 50 MG tablet Commonly known as: ULTRAM TAKE 1 TABLET (50 MG TOTAL) BY MOUTH EVERY 12 (TWELVE) HOURS.   VITAMIN B 12 PO Take 1 tablet by mouth daily.         Allergies  Allergen Reactions   Prednisone Shortness Of Breath and Swelling   Crestor [Rosuvastatin Calcium] Other (See Comments)    myalgia   Lyrica [Pregabalin]     "swelling"    Past Medical History:  Diagnosis Date   Allergic rhinitis    Anemia    Asthma    CAD (coronary artery disease)    a. s/p PTCA/stenting of the mid LAD and mid RCA  04/06/12   Carotid bruit    r   Colon polyp    Diverticulosis    ED (erectile dysfunction)    Fatty liver    GERD (gastroesophageal reflux disease)    Gout    Hyperlipidemia    Hypertension    Obesity    Osteoarthritis    Renal insufficiency    Restless leg syndrome     Past Surgical History:  Procedure Laterality Date   BACK SURGERY  2020   COLONOSCOPY  04/23/2016   Dr.Perry   INGUINAL HERNIA REPAIR Right 02/27/2016   Procedure: HERNIA REPAIR INGUINAL ADULT;  Surgeon: Rolm Bookbinder, MD;  Location: [facility];  Service: General;  Laterality: Right;   INSERTION OF MESH Right 02/27/2016   Procedure: INSERTION OF MESH--IN AND OUT CATHETERIZATION PERFORMED AT END OF PROCEDURE TO DRAIN 400ML URINE.;  Surgeon: Rolm Bookbinder, MD;  Location: [facility];  Service: General;  Laterality: Right;   LYMPH NODE BIOPSY     right side of neck 1999 - benign   PERCUTANEOUS CORONARY STENT INTERVENTION (PCI-S) N/A 04/06/2012   Procedure: PERCUTANEOUS CORONARY STENT INTERVENTION (PCI-S);  Surgeon: Damaris Geers M Martinique, MD; Promus DES for 90% mid LAD and 95% mid RCA disease     UPPER GASTROINTESTINAL ENDOSCOPY  04/23/2016   Dr.Perry    Social History   Tobacco Use  Smoking Status Former   Types: Cigarettes   Quit date: 06/04/1975   Years since quitting: 46.7  Smokeless Tobacco Never    Social History   Substance and Sexual Activity  Alcohol Use No   Alcohol/week: 0.0 standard drinks of alcohol    Family History  Problem Relation Age of Onset   Hypertension Mother    Stroke Mother        Several    Stomach cancer Mother    Glaucoma Mother    Coronary artery disease Father        in his 26s   Heart disease Father    Heart attack Father    Hypertension Paternal Grandmother    Hypertension Paternal Grandfather    Colon cancer Neg Hx    Rectal cancer Neg Hx     Review of Systems: The review of systems is per the HPI.  All other systems were  reviewed and are negative.  Physical Exam: BP (!) 164/72   Pulse 66   Ht '5\' 7"'$  (1.702 m)   Wt 252 lb 6.4 oz (114.5 kg)   SpO2 97%   BMI 39.53 kg/m  GENERAL:  Well appearing, obese WM  HEENT:  PERRL, EOMI, sclera are clear. Oropharynx is clear.  NECK:  No jugular venous distention, carotid upstroke brisk and symmetric, no bruits, no thyromegaly or adenopathy LUNGS:  Clear to auscultation bilaterally CHEST:  Unremarkable HEART:  RRR,  PMI not displaced or sustained,S1 and S2 within normal limits, no S3, no S4: no clicks, no rubs, gr 2/6 systolic murmur RUSB radiating to carotids.  ABD:  Soft, nontender. BS +, no masses or bruits. No hepatomegaly, no splenomegaly EXT:  2 + pulses throughout, no edema, no cyanosis no clubbing SKIN:  Warm and dry.  No rashes NEURO:  Alert and oriented x 3. Cranial nerves II through XII intact. PSYCH:  Cognitively intact      LABORATORY DATA: Lab Results  Component Value Date   WBC 6.5 08/07/2021   HGB 12.3 (L) 08/07/2021   HCT 36.3 (L) 08/07/2021   PLT 239 08/07/2021   GLUCOSE 125 (H) 08/07/2021   CHOL 148 04/17/2021   TRIG 147.0 04/17/2021   HDL 46.70 04/17/2021   LDLDIRECT 164.9 12/10/2010   LDLCALC 72 04/17/2021   ALT 33 08/07/2021   AST 30 08/07/2021   NA 135 08/07/2021   K 4.0 08/07/2021   CL 101 08/07/2021   CREATININE 1.12 08/07/2021   BUN 16 08/07/2021   CO2 24 08/07/2021   TSH 3.03 04/17/2021   PSA 0.67 04/17/2021   INR 0.9 08/07/2021   HGBA1C 6.2 04/17/2021   Ecg not done today     Lexiscan myoview 07/28/15: Study Highlights     There was no ST segment deviation noted during stress. The left ventricular ejection fraction is normal (55-65%). Nuclear stress EF: 56%. Defect 1: There is a medium defect of moderate severity present in the basal inferoseptal, basal inferior, mid inferior and apical inferior location. In setting of normal LVF, this is consistent with diaphragmatic attenuation artifact. No ischemia noted. This  is a low risk study.      Addendum by Sueanne Margarita, MD on Fri Jul 28, 2015  2:03 PM    There was no ST segment deviation noted during stress. The left ventricular ejection fraction is normal (55-65%). Nuclear stress EF: 56%. Defect 1: There is a medium defect of moderate severity present in the basal inferoseptal, basal inferior, mid inferior and apical inferior location. In setting of normal LVF, this is consistent with diaphragmatic attenuation artifact. No ischemia noted. This is a low risk study.     Echo: 08/25/15: Study Conclusions   - Left ventricle: The cavity size was normal. Wall thickness was   normal. Systolic function was normal. The estimated ejection   fraction was in the range of 60% to 65%. Wall motion was normal;   there were no regional wall motion abnormalities. - Aortic valve: Moderately calcified annulus. Moderately calcified   leaflets. There was mild stenosis. - Mitral valve: There was mild regurgitation. - Left atrium: The atrium was mildly dilated   Assessment / Plan:  1. CAD - status post 2 vessel PCI with DES in November 2013- doing well.  Myoview study in February 2017 was low risk with an area of inferior scar and no ischemia. Unchanged from 2014.he remains asymptomatic.  We will continue medical therapy and risk factor modification.   2. HTN - blood pressure is elevated today but he has better readings at home.   He is on multiple meds. Will monitor on current therapy. Encourage weight loss and increased aerobic activity.   3. Hyponatremia secondary to chlorthalidone.  4. CKD stage 3a.   5. Hyperlipidemia. Intolerant of statins  at higher doses. Continue with  dietary modification and weight loss. Continue pravastatin. Excellent response to addition of Zetia. Last LDL 72.   6. Carotid arterial disease with moderate 14-97% LICA stenosis, 02-63% RICA stenosis. Asymptomatic. Dopplers in October 2022 unchanged. Plan to repeat this fall.   7. History of  mild Aortic stenosis.

## 2022-02-07 ENCOUNTER — Ambulatory Visit: Payer: Medicare HMO | Attending: Cardiology | Admitting: Cardiology

## 2022-02-07 ENCOUNTER — Encounter: Payer: Self-pay | Admitting: Cardiology

## 2022-02-07 VITALS — BP 164/72 | HR 66 | Ht 67.0 in | Wt 252.4 lb

## 2022-02-07 DIAGNOSIS — I2510 Atherosclerotic heart disease of native coronary artery without angina pectoris: Secondary | ICD-10-CM | POA: Diagnosis not present

## 2022-02-07 DIAGNOSIS — I350 Nonrheumatic aortic (valve) stenosis: Secondary | ICD-10-CM

## 2022-02-07 DIAGNOSIS — E7800 Pure hypercholesterolemia, unspecified: Secondary | ICD-10-CM

## 2022-02-07 DIAGNOSIS — I10 Essential (primary) hypertension: Secondary | ICD-10-CM | POA: Diagnosis not present

## 2022-02-07 DIAGNOSIS — I6523 Occlusion and stenosis of bilateral carotid arteries: Secondary | ICD-10-CM | POA: Diagnosis not present

## 2022-02-08 ENCOUNTER — Encounter: Payer: Self-pay | Admitting: *Deleted

## 2022-02-08 ENCOUNTER — Telehealth: Payer: Self-pay | Admitting: *Deleted

## 2022-02-08 NOTE — Patient Outreach (Signed)
  Care Coordination   Initial Visit Note   02/08/2022 Name: FINNBAR CEDILLOS MRN: [id number] DOB: September 30, 1945  [name] is a 76 y.o. year old male who sees Vivi Barrack, MD for primary care. I spoke with  [name] by phone today.  What matters to the patients health and wellness today?  No needs    Goals Addressed               This Visit's Progress     COMPLETED: No needs (pt-stated)        Care Coordination Interventions: Reviewed medications with patient and discussed medication adherence Reviewed scheduled/upcoming provider appointments including pending appointments and requested pt to scheduled his AWV this year with his primary provider Screening for signs and symptoms of depression related to chronic disease state  Assessed social determinant of health barriers          SDOH assessments and interventions completed:  Yes  SDOH Interventions Today    Flowsheet Row Most Recent Value  SDOH Interventions   Food Insecurity Interventions Intervention Not Indicated  Housing Interventions Intervention Not Indicated  Transportation Interventions Intervention Not Indicated        Care Coordination Interventions Activated:  Yes  Care Coordination Interventions:  Yes, provided   Follow up plan: No further intervention required.   Encounter Outcome:  Pt. Visit Completed   Raina Mina, RN Care Management Coordinator [place] Office [phone]

## 2022-02-08 NOTE — Patient Instructions (Signed)
Visit Information  Thank you for taking time to visit with me today. Please don't hesitate to contact me if I can be of assistance to you.   Following are the goals we discussed today:   Goals Addressed               This Visit's Progress     COMPLETED: No needs (pt-stated)        Care Coordination Interventions: Reviewed medications with patient and discussed medication adherence Reviewed scheduled/upcoming provider appointments including pending appointments and requested pt to scheduled his AWV this year with his primary provider Screening for signs and symptoms of depression related to chronic disease state  Assessed social determinant of health barriers          Please call the care guide team at [phone] if you need to cancel or reschedule your appointment.   If you are experiencing a Mental Health or [place] or need someone to talk to, please call the Suicide and Crisis Lifeline: 988 call the [place] National Suicide Prevention Lifeline: (954)177-1951 or TTY: 4028335361 TTY 737-778-1549) to talk to a trained counselor call 1-800-273-TALK (toll free, 24 hour hotline)  Patient verbalizes understanding of instructions and care plan provided today and agrees to view in [place]. Active MyChart status and patient understanding of how to access instructions and care plan via MyChart confirmed with patient.     No further follow up required: No needs  Raina Mina, RN Care Management Coordinator [place] Office [phone]

## 2022-02-17 ENCOUNTER — Other Ambulatory Visit: Payer: Self-pay | Admitting: Family Medicine

## 2022-02-17 DIAGNOSIS — J302 Other seasonal allergic rhinitis: Secondary | ICD-10-CM

## 2022-02-25 ENCOUNTER — Encounter: Payer: Self-pay | Admitting: *Deleted

## 2022-03-07 ENCOUNTER — Other Ambulatory Visit: Payer: Self-pay | Admitting: Family Medicine

## 2022-03-11 ENCOUNTER — Ambulatory Visit (HOSPITAL_COMMUNITY)
Admission: RE | Admit: 2022-03-11 | Discharge: 2022-03-11 | Disposition: A | Discharge status: Home / Self Care | Payer: Medicare HMO | Source: Ambulatory Visit | Attending: Internal Medicine | Admitting: Internal Medicine

## 2022-03-11 DIAGNOSIS — I6523 Occlusion and stenosis of bilateral carotid arteries: Secondary | ICD-10-CM

## 2022-03-11 DIAGNOSIS — I10 Essential (primary) hypertension: Secondary | ICD-10-CM | POA: Insufficient documentation

## 2022-03-11 DIAGNOSIS — I779 Disorder of arteries and arterioles, unspecified: Secondary | ICD-10-CM | POA: Diagnosis present

## 2022-03-11 DIAGNOSIS — E785 Hyperlipidemia, unspecified: Secondary | ICD-10-CM | POA: Insufficient documentation

## 2022-03-11 DIAGNOSIS — I2510 Atherosclerotic heart disease of native coronary artery without angina pectoris: Secondary | ICD-10-CM | POA: Insufficient documentation

## 2022-03-11 DIAGNOSIS — Z87891 Personal history of nicotine dependence: Secondary | ICD-10-CM | POA: Diagnosis not present

## 2022-03-13 ENCOUNTER — Other Ambulatory Visit: Payer: Self-pay

## 2022-03-13 DIAGNOSIS — I779 Disorder of arteries and arterioles, unspecified: Secondary | ICD-10-CM

## 2022-03-13 NOTE — Progress Notes (Signed)
vas 

## 2022-03-18 ENCOUNTER — Ambulatory Visit: Payer: Medicare HMO | Admitting: Podiatry

## 2022-03-18 ENCOUNTER — Encounter: Payer: Self-pay | Admitting: Podiatry

## 2022-03-18 DIAGNOSIS — N182 Chronic kidney disease, stage 2 (mild): Secondary | ICD-10-CM

## 2022-03-18 DIAGNOSIS — M79675 Pain in left toe(s): Secondary | ICD-10-CM | POA: Diagnosis not present

## 2022-03-18 DIAGNOSIS — M79674 Pain in right toe(s): Secondary | ICD-10-CM | POA: Diagnosis not present

## 2022-03-18 DIAGNOSIS — B351 Tinea unguium: Secondary | ICD-10-CM | POA: Diagnosis not present

## 2022-03-18 NOTE — Progress Notes (Signed)
This patient returns to my office for at risk foot care.  This patient requires this care by a professional since this patient will be at risk due to having chronic kidney disease.  Patient has painful callus on the outside bottom of both feet.  This patient is unable to cut nails himself since the patient cannot reach his nails.These nails are painful walking and wearing shoes. Patient has painful callus on the outside ball both feet.  Patient is unable to self treat.   This patient presents for at risk foot care today.  General Appearance  Alert, conversant and in no acute stress.  Vascular  Dorsalis pedis and posterior tibial  pulses are palpable  bilaterally.  Capillary return is within normal limits  bilaterally. Temperature is within normal limits  bilaterally.  Neurologic  Senn-Weinstein monofilament wire test within normal limits  bilaterally. Muscle power within normal limits bilaterally.  Nails Thick disfigured discolored nails with subungual debris  from hallux to fifth toes bilaterally. No evidence of bacterial infection or drainage bilaterally.  Orthopedic  No limitations of motion  feet .  No crepitus or effusions noted.  No bony pathology or digital deformities noted.  Skin  normotropic skin with no porokeratosis noted bilaterally.  No signs of infections or ulcers noted.    Onychomycosis  Pain in right toes  Pain in left foot   Consent was obtained for treatment procedures.   Mechanical debridement of nails 1-5  bilaterally performed with a nail nipper.  Filed with dremel without incident. No infection or ulcer.  .   Return office visit     3 months                 Told patient to return for periodic foot care and evaluation due to potential at risk complications.   Rabiah Goeser DPM  

## 2022-03-19 ENCOUNTER — Other Ambulatory Visit: Payer: Self-pay | Admitting: Family Medicine

## 2022-04-16 DIAGNOSIS — H401131 Primary open-angle glaucoma, bilateral, mild stage: Secondary | ICD-10-CM | POA: Diagnosis not present

## 2022-04-17 ENCOUNTER — Other Ambulatory Visit: Payer: Self-pay | Admitting: *Deleted

## 2022-04-18 MED ORDER — TRAMADOL HCL 50 MG PO TABS: 50.0000 mg | ORAL_TABLET | Freq: Two times a day (BID) | ORAL | 5 refills | Status: DC

## 2022-05-16 ENCOUNTER — Encounter: Payer: Self-pay | Admitting: *Deleted

## 2022-05-16 ENCOUNTER — Other Ambulatory Visit: Payer: Self-pay | Admitting: Family Medicine

## 2022-05-16 DIAGNOSIS — J302 Other seasonal allergic rhinitis: Secondary | ICD-10-CM

## 2022-05-25 ENCOUNTER — Other Ambulatory Visit: Payer: Self-pay | Admitting: Cardiology

## 2022-05-28 ENCOUNTER — Ambulatory Visit (INDEPENDENT_AMBULATORY_CARE_PROVIDER_SITE_OTHER): Payer: Medicare HMO

## 2022-05-28 VITALS — Wt 252.0 lb

## 2022-05-28 DIAGNOSIS — Z0000 Encounter for general adult medical examination without abnormal findings: Secondary | ICD-10-CM | POA: Diagnosis not present

## 2022-05-28 NOTE — Patient Instructions (Signed)
Todd Spears , Thank you for taking time to come for your Medicare Wellness Visit. I appreciate your ongoing commitment to your health goals. Please review the following plan we discussed and let me know if I can assist you in the future.   These are the goals we discussed:  Goals      Patient Stated     Lose weight         This is a list of the screening recommended for you and due dates:  Health Maintenance  Topic Date Due   Zoster (Shingles) Vaccine (1 of 2) Never done   COVID-19 Vaccine (4 - 2023-24 season) 02/01/2022   DTaP/Tdap/Td vaccine (3 - Td or Tdap) 03/05/2023   Medicare Annual Wellness Visit  05/29/2023   Pneumonia Vaccine  Completed   Flu Shot  Completed   Hepatitis C Screening: USPSTF Recommendation to screen - Ages 18-79 yo.  Completed   HPV Vaccine  Aged Out   Colon Cancer Screening  Discontinued    Advanced directives: Please bring a copy of your health care power of attorney and living will to the office at your convenience.  Conditions/risks identified: lose some weight   Next appointment: Follow up in one year for your annual wellness visit.   Preventive Care 41 Years and Older, Male  Preventive care refers to lifestyle choices and visits with your health care provider that can promote health and wellness. What does preventive care include? A yearly physical exam. This is also called an annual well check. Dental exams once or twice a year. Routine eye exams. Ask your health care provider how often you should have your eyes checked. Personal lifestyle choices, including: Daily care of your teeth and gums. Regular physical activity. Eating a healthy diet. Avoiding tobacco and drug use. Limiting alcohol use. Practicing safe sex. Taking low doses of aspirin every day. Taking vitamin and mineral supplements as recommended by your health care provider. What happens during an annual well check? The services and screenings done by your health care provider  during your annual well check will depend on your age, overall health, lifestyle risk factors, and family history of disease. Counseling  Your health care provider may ask you questions about your: Alcohol use. Tobacco use. Drug use. Emotional well-being. Home and relationship well-being. Sexual activity. Eating habits. History of falls. Memory and ability to understand (cognition). Work and work Statistician. Screening  You may have the following tests or measurements: Height, weight, and BMI. Blood pressure. Lipid and cholesterol levels. These may be checked every 5 years, or more frequently if you are over 67 years old. Skin check. Lung cancer screening. You may have this screening every year starting at age 17 if you have a 30-pack-year history of smoking and currently smoke or have quit within the past 15 years. Fecal occult blood test (FOBT) of the stool. You may have this test every year starting at age 76. Flexible sigmoidoscopy or colonoscopy. You may have a sigmoidoscopy every 5 years or a colonoscopy every 10 years starting at age 76. Prostate cancer screening. Recommendations will vary depending on your family history and other risks. Hepatitis C blood test. Hepatitis B blood test. Sexually transmitted disease (STD) testing. Diabetes screening. This is done by checking your blood sugar (glucose) after you have not eaten for a while (fasting). You may have this done every 1-76 years. Abdominal aortic aneurysm (AAA) screening. You may need this if you are a current or former smoker. Osteoporosis. You  may be screened starting at age 57 if you are at high risk. Talk with your health care provider about your test results, treatment options, and if necessary, the need for more tests. Vaccines  Your health care provider may recommend certain vaccines, such as: Influenza vaccine. This is recommended every year. Tetanus, diphtheria, and acellular pertussis (Tdap, Td) vaccine. You  may need a Td booster every 10 years. Zoster vaccine. You may need this after age 76. Pneumococcal 13-valent conjugate (PCV13) vaccine. One dose is recommended after age 76. Pneumococcal polysaccharide (PPSV23) vaccine. One dose is recommended after age 76. Talk to your health care provider about which screenings and vaccines you need and how often you need them. This information is not intended to replace advice given to you by your health care provider. Make sure you discuss any questions you have with your health care provider. Document Released: 06/16/2015 Document Revised: 02/07/2016 Document Reviewed: 03/21/2015 Elsevier Interactive Patient Education  2017 [place] Prevention in the Home Falls can cause injuries. They can happen to people of all ages. There are many things you can do to make your home safe and to help prevent falls. What can I do on the outside of my home? Regularly fix the edges of walkways and driveways and fix any cracks. Remove anything that might make you trip as you walk through a door, such as a raised step or threshold. Trim any bushes or trees on the path to your home. Use bright outdoor lighting. Clear any walking paths of anything that might make someone trip, such as rocks or tools. Regularly check to see if handrails are loose or broken. Make sure that both sides of any steps have handrails. Any raised decks and porches should have guardrails on the edges. Have any leaves, snow, or ice cleared regularly. Use sand or salt on walking paths during winter. Clean up any spills in your garage right away. This includes oil or grease spills. What can I do in the bathroom? Use night lights. Install grab bars by the toilet and in the tub and shower. Do not use towel bars as grab bars. Use non-skid mats or decals in the tub or shower. If you need to sit down in the shower, use a plastic, non-slip stool. Keep the floor dry. Clean up any water that spills  on the floor as soon as it happens. Remove soap buildup in the tub or shower regularly. Attach bath mats securely with double-sided non-slip rug tape. Do not have throw rugs and other things on the floor that can make you trip. What can I do in the bedroom? Use night lights. Make sure that you have a light by your bed that is easy to reach. Do not use any sheets or blankets that are too big for your bed. They should not hang down onto the floor. Have a firm chair that has side arms. You can use this for support while you get dressed. Do not have throw rugs and other things on the floor that can make you trip. What can I do in the kitchen? Clean up any spills right away. Avoid walking on wet floors. Keep items that you use a lot in easy-to-reach places. If you need to reach something above you, use a strong step stool that has a grab bar. Keep electrical cords out of the way. Do not use floor polish or wax that makes floors slippery. If you must use wax, use non-skid floor wax. Do  not have throw rugs and other things on the floor that can make you trip. What can I do with my stairs? Do not leave any items on the stairs. Make sure that there are handrails on both sides of the stairs and use them. Fix handrails that are broken or loose. Make sure that handrails are as long as the stairways. Check any carpeting to make sure that it is firmly attached to the stairs. Fix any carpet that is loose or worn. Avoid having throw rugs at the top or bottom of the stairs. If you do have throw rugs, attach them to the floor with carpet tape. Make sure that you have a light switch at the top of the stairs and the bottom of the stairs. If you do not have them, ask someone to add them for you. What else can I do to help prevent falls? Wear shoes that: Do not have high heels. Have rubber bottoms. Are comfortable and fit you well. Are closed at the toe. Do not wear sandals. If you use a stepladder: Make  sure that it is fully opened. Do not climb a closed stepladder. Make sure that both sides of the stepladder are locked into place. Ask someone to hold it for you, if possible. Clearly mark and make sure that you can see: Any grab bars or handrails. First and last steps. Where the edge of each step is. Use tools that help you move around (mobility aids) if they are needed. These include: Canes. Walkers. Scooters. Crutches. Turn on the lights when you go into a dark area. Replace any light bulbs as soon as they burn out. Set up your furniture so you have a clear path. Avoid moving your furniture around. If any of your floors are uneven, fix them. If there are any pets around you, be aware of where they are. Review your medicines with your doctor. Some medicines can make you feel dizzy. This can increase your chance of falling. Ask your doctor what other things that you can do to help prevent falls. This information is not intended to replace advice given to you by your health care provider. Make sure you discuss any questions you have with your health care provider. Document Released: 03/16/2009 Document Revised: 10/26/2015 Document Reviewed: 06/24/2014 Elsevier Interactive Patient Education  2017 [employer].

## 2022-05-28 NOTE — Progress Notes (Signed)
I connected with  Todd Spears on 05/28/22 by a audio enabled telemedicine application and verified that I am speaking with the correct person using two identifiers.  Patient Location: Home  Provider Location: Office/Clinic  I discussed the limitations of evaluation and management by telemedicine. The patient expressed understanding and agreed to proceed.   Subjective:   Todd Spears is a 76 y.o. male who presents for an Initial Medicare Annual Wellness Visit.  Review of Systems     Cardiac Risk Factors include: advanced age (>33mn, >>22women);dyslipidemia;male gender;hypertension;obesity (BMI >30kg/m2)     Objective:    Today's Vitals   05/28/22 1412  Weight: 252 lb (114.3 kg)   Body mass index is 39.47 kg/m.     05/28/2022    2:17 PM 08/07/2021   10:01 AM 06/24/2018    7:58 PM 04/23/2016    7:21 AM 02/21/2016   10:47 AM 02/05/2016   11:36 PM 07/03/2015   10:00 PM  Advanced Directives  Does Patient Have a Medical Advance Directive? No No No No No No No  Would patient like information on creating a medical advance directive? No - Patient declined  No - Patient declined   No - patient declined information     Current Medications (verified) Outpatient Encounter Medications as of 05/28/2022  Medication Sig   albuterol (VENTOLIN HFA) 108 (90 Base) MCG/ACT inhaler INHALE 2 PUFFS INTO LUNGS EVERY 6 HOURS AS NEEDED FOR SHORTNESS OF BREATH   amLODipine (NORVASC) 10 MG tablet TAKE 1 TABLET BY MOUTH EVERY DAY   aspirin 81 MG chewable tablet Chew 81 mg by mouth daily.   Azelastine HCl 137 MCG/SPRAY SOLN INSTILL 2 SPRAYS INTO BOTH NOSTRILS 2 TIMES DAILY AS DIRECTED   cetirizine (ZYRTEC) 10 MG tablet Take 10 mg by mouth every morning.   Cyanocobalamin (VITAMIN B 12 PO) Take 1 tablet by mouth daily.    esomeprazole (NEXIUM) 40 MG capsule TAKE ONE CAPSULE BY MOUTH DAILY BEFORE MEALS   ezetimibe (ZETIA) 10 MG tablet TAKE 1 TABLET BY MOUTH EVERY DAY   FeFum-FePoly-FA-B Cmp-C-Biot  (INTEGRA PLUS) CAPS TAKE 1 CAPSULE BY MOUTH EVERY DAY   FLUZONE HIGH-DOSE QUADRIVALENT 0.7 ML SUSY    hydrALAZINE (APRESOLINE) 50 MG tablet TAKE 2 TABLETS BY MOUTH 3 TIMES A DAY   lisinopril (ZESTRIL) 40 MG tablet TAKE 1 TABLET BY MOUTH EVERY DAY   loratadine (CLARITIN) 10 MG tablet Take 10 mg by mouth at bedtime.    LUMIGAN 0.01 % SOLN Place 1 drop into the left eye at bedtime.   metoprolol succinate (TOPROL-XL) 50 MG 24 hr tablet TAKE 1 TABLET EVERY DAY WITH OR IMMEDIATELY FOLLOWING A MEAL   montelukast (SINGULAIR) 10 MG tablet TAKE 1 TABLET BY MOUTH EVERYDAY AT BEDTIME   Multiple Vitamin (MULTIVITAMIN WITH MINERALS) TABS tablet Take 1 tablet by mouth daily.   pravastatin (PRAVACHOL) 10 MG tablet TAKE 1 TABLET EVERY OTHER DAY, ALTERNATING WITH 2 TABLETS.   traMADol (ULTRAM) 50 MG tablet Take 1 tablet (50 mg total) by mouth every 12 (twelve) hours.   diclofenac sodium (VOLTAREN) 1 % GEL Apply 2 g topically 4 (four) times daily. (Patient not taking: Reported on 05/28/2022)   nitroGLYCERIN (NITROSTAT) 0.4 MG SL tablet Place 1 tablet (0.4 mg total) under the tongue every 5 (five) minutes as needed for chest pain (up to 3 doses. not within 24hrs of Viagra). (Patient not taking: Reported on 05/28/2022)   [DISCONTINUED] ezetimibe (ZETIA) 10 MG tablet TAKE 1 TABLET BY  MOUTH EVERY DAY   No facility-administered encounter medications on file as of 05/28/2022.    Allergies (verified) Prednisone, Crestor [rosuvastatin calcium], Gabapentin, and Lyrica [pregabalin]   History: Past Medical History:  Diagnosis Date   Allergic rhinitis    Anemia    Asthma    CAD (coronary artery disease)    a. s/p PTCA/stenting of the mid LAD and mid RCA 04/06/12   Carotid bruit    r   Colon polyp    Diverticulosis    ED (erectile dysfunction)    Fatty liver    GERD (gastroesophageal reflux disease)    Gout    Hyperlipidemia    Hypertension    Obesity    Osteoarthritis    Renal insufficiency    Restless leg  syndrome    Past Surgical History:  Procedure Laterality Date   BACK SURGERY  2020   COLONOSCOPY  04/23/2016   Dr.Perry   INGUINAL HERNIA REPAIR Right 02/27/2016   Procedure: HERNIA REPAIR INGUINAL ADULT;  Surgeon: Rolm Bookbinder, MD;  Location: [facility];  Service: General;  Laterality: Right;   INSERTION OF MESH Right 02/27/2016   Procedure: INSERTION OF MESH--IN AND OUT CATHETERIZATION PERFORMED AT END OF PROCEDURE TO DRAIN 400ML URINE.;  Surgeon: Rolm Bookbinder, MD;  Location: [facility];  Service: General;  Laterality: Right;   LYMPH NODE BIOPSY     right side of neck 1999 - benign   PERCUTANEOUS CORONARY STENT INTERVENTION (PCI-S) N/A 04/06/2012   Procedure: PERCUTANEOUS CORONARY STENT INTERVENTION (PCI-S);  Surgeon: Peter M Martinique, MD; Promus DES for 90% mid LAD and 95% mid RCA disease     UPPER GASTROINTESTINAL ENDOSCOPY  04/23/2016   Dr.Perry   Family History  Problem Relation Age of Onset   Hypertension Mother    Stroke Mother        Several    Stomach cancer Mother    Glaucoma Mother    Coronary artery disease Father        in his 18s   Heart disease Father    Heart attack Father    Hypertension Paternal Grandmother    Hypertension Paternal Grandfather    Colon cancer Neg Hx    Rectal cancer Neg Hx    Social History   Socioeconomic History   Marital status: Married    Spouse name: Not on file   Number of children: 1   Years of education: Not on file   Highest education level: Not on file  Occupational History    Employer: [employer]  Tobacco Use   Smoking status: Former    Types: Cigarettes    Quit date: 06/04/1975    Years since quitting: 47.0   Smokeless tobacco: Never  Vaping Use   Vaping Use: Never used  Substance and Sexual Activity   Alcohol use: No    Alcohol/week: 0.0 standard drinks of alcohol   Drug use: No   Sexual activity: Not Currently  Other Topics Concern   Not on file  Social History  Narrative   Works as a Engineer, building services    Married   One son ( lives in [place])    Regular exercise- no   Social Determinants of Health   Financial Resource Strain: Low Risk  (05/28/2022)   Overall Financial Resource Strain (CARDIA)    Difficulty of Paying Living Expenses: Not hard at all  Food Insecurity: No Food Insecurity (05/28/2022)   Hunger Vital Sign    Worried About  Running Out of Food in the Last Year: Never true    [place] in the Last Year: Never true  Transportation Needs: No Transportation Needs (05/28/2022)   PRAPARE - Hydrologist (Medical): No    Lack of Transportation (Non-Medical): No  Physical Activity: Inactive (05/28/2022)   Exercise Vital Sign    Days of Exercise per Week: 0 days    Minutes of Exercise per Session: 0 min  Stress: No Stress Concern Present (05/28/2022)   [place]    Feeling of Stress : Not at all  Social Connections: Moderately Integrated (05/28/2022)   Social Connection and Isolation Panel [NHANES]    Frequency of Communication with Friends and Family: More than three times a week    Frequency of Social Gatherings with Friends and Family: More than three times a week    Attends Religious Services: More than 4 times per year    Active Member of [employer] or Organizations: No    Attends Music therapist: Never    Marital Status: Married    Tobacco Counseling Counseling given: Not Answered   Clinical Intake:  Pre-visit preparation completed: Yes  Pain : No/denies pain     BMI - recorded: 39.47 Nutritional Status: BMI > 30  Obese Nutritional Risks: None Diabetes: No  How often do you need to have someone help you when you read instructions, pamphlets, or other written materials from your doctor or pharmacy?: 1 - Never  Diabetic?no  Interpreter Needed?: No  Information entered by :: Charlott Rakes,  LPN   Activities of Daily Living    05/28/2022    2:18 PM  In your present state of health, do you have any difficulty performing the following activities:  Hearing? 0  Vision? 0  Difficulty concentrating or making decisions? 0  Walking or climbing stairs? 0  Dressing or bathing? 0  Doing errands, shopping? 0  Preparing Food and eating ? N  Using the Toilet? N  In the past six months, have you accidently leaked urine? N  Do you have problems with loss of bowel control? N  Managing your Medications? N  Managing your Finances? N  Housekeeping or managing your Housekeeping? N    Patient Care Team: Vivi Barrack, MD as PCP - General (Family Medicine) Martinique, Peter M, MD as PCP - Cardiology (Cardiology) Tobi Bastos, RN as [place] any recent Medical Services you may have received from other than [facility] providers in the past year (date may be approximate).     Assessment:   This is a routine wellness examination for [place].  Hearing/Vision screen Hearing Screening - Comments:: Pt lost hearing in left ear  Vision Screening - Comments:: Pt follows up with Dr [place] for annul eye exams   Dietary issues and exercise activities discussed: Current Exercise Habits: The patient has a physically strenuous job, but has no regular exercise apart from work.   Goals Addressed             This Visit's Progress    Patient Stated       Lose weight        Depression Screen    05/28/2022    2:16 PM 12/11/2021   11:19 AM 04/17/2021    8:30 AM 04/13/2020    9:18 AM 04/13/2020    9:03 AM 04/14/2019    8:03 AM 03/18/2017  7:27 AM  PHQ 2/9 Scores  PHQ - 2 Score 0 0 0 0 0 0 0    Fall Risk    05/28/2022    2:18 PM 12/11/2021   11:19 AM 04/17/2021    8:30 AM 04/13/2020    9:02 AM 04/14/2019    8:03 AM  Fall Risk   Falls in the past year? 0 0 0 0 0  Number falls in past yr: 0 0 0 0   Injury with Fall? 0 0 0 0   Risk for fall  due to : Impaired vision No Fall Risks     Follow up Falls prevention discussed        FALL RISK PREVENTION PERTAINING TO THE HOME:  Any stairs in or around the home? Yes  If so, are there any without handrails? No  Home free of loose throw rugs in walkways, pet beds, electrical cords, etc? Yes  Adequate lighting in your home to reduce risk of falls? Yes   ASSISTIVE DEVICES UTILIZED TO PREVENT FALLS:  Life alert? No  Use of a cane, walker or w/c? No  Grab bars in the bathroom? No  Shower chair or bench in shower? No  Elevated toilet seat or a handicapped toilet? No   TIMED UP AND GO:  Was the test performed? No .   Cognitive Function:        05/28/2022    2:18 PM  6CIT Screen  What Year? 0 points  What month? 0 points  What time? 0 points  Count back from 20 0 points  Months in reverse 0 points  Repeat phrase 0 points  Total Score 0 points    Immunizations Immunization History  Administered Date(s) Administered   Fluad Quad(high Dose 65+) 03/06/2019, 03/16/2020   Influenza Split 03/22/2011, 04/07/2012, 03/14/2014   Influenza Whole 06/04/2003, 03/23/2009   Influenza, High Dose Seasonal PF 03/14/2015, 03/21/2016, 03/18/2017, 02/27/2018, 02/20/2021   Influenza,inj,Quad PF,6+ Mos 03/04/2013   Influenza-Unspecified 03/16/2022   Moderna Sars-Covid-2 Vaccination 06/29/2019, 07/27/2019, 04/04/2020   Pneumococcal Conjugate-13 06/16/2014   Pneumococcal Polysaccharide-23 06/03/2004, 03/04/2013   Td 06/03/2002   Tdap 03/04/2013   Zoster, Live 12/17/2010    TDAP status: Up to date  Flu Vaccine status: Up to date  Pneumococcal vaccine status: Up to date  Covid-19 vaccine status: Completed vaccines  Qualifies for Shingles Vaccine? Yes   Zostavax completed No   Shingrix Completed?: No.    Education has been provided regarding the importance of this vaccine. Patient has been advised to call insurance company to determine out of pocket expense if they have not yet  received this vaccine. Advised may also receive vaccine at local pharmacy or Health Dept. Verbalized acceptance and understanding.  Screening Tests Health Maintenance  Topic Date Due   Zoster Vaccines- Shingrix (1 of 2) Never done   COVID-19 Vaccine (4 - 2023-24 season) 02/01/2022   DTaP/Tdap/Td (3 - Td or Tdap) 03/05/2023   Medicare Annual Wellness (AWV)  05/29/2023   Pneumonia Vaccine 101+ Years old  Completed   INFLUENZA VACCINE  Completed   Hepatitis C Screening  Completed   HPV VACCINES  Aged Out   COLONOSCOPY (Pts 45-11yr Insurance coverage will need to be confirmed)  Discontinued    Health Maintenance  Health Maintenance Due  Topic Date Due   Zoster Vaccines- Shingrix (1 of 2) Never done   COVID-19 Vaccine (4 - 2023-24 season) 02/01/2022    Colorectal cancer screening: No longer required.    Additional  Screening:  Hepatitis C Screening: Completed 03/19/15  Vision Screening: Recommended annual ophthalmology exams for early detection of glaucoma and other disorders of the eye. Is the patient up to date with their annual eye exam?  Yes  Who is the provider or what is the name of the office in which the patient attends annual eye exams? [facility]  If pt is not established with a provider, would they like to be referred to a provider to establish care? No .   Dental Screening: Recommended annual dental exams for proper oral hygiene  Community Resource Referral / Chronic Care Management: CRR required this visit?  No   CCM required this visit?  No      Plan:     I have personally reviewed and noted the following in the patient's chart:   Medical and social history Use of alcohol, tobacco or illicit drugs  Current medications and supplements including opioid prescriptions. Patient is currently taking opioid prescriptions. Information provided to patient regarding non-opioid alternatives. Patient advised to discuss non-opioid treatment plan with their  provider. Functional ability and status Nutritional status Physical activity Advanced directives List of other physicians Hospitalizations, surgeries, and ER visits in previous 12 months Vitals Screenings to include cognitive, depression, and falls Referrals and appointments  In addition, I have reviewed and discussed with patient certain preventive protocols, quality metrics, and best practice recommendations. A written personalized care plan for preventive services as well as general preventive health recommendations were provided to patient.     Willette Brace, LPN   [id number]   Nurse Notes: none

## 2022-06-05 ENCOUNTER — Other Ambulatory Visit: Payer: Self-pay | Admitting: Family Medicine

## 2022-06-17 ENCOUNTER — Encounter: Payer: Medicare HMO | Admitting: Family Medicine

## 2022-06-18 ENCOUNTER — Ambulatory Visit: Payer: Medicare HMO | Admitting: Podiatry

## 2022-06-18 ENCOUNTER — Encounter: Payer: Self-pay | Admitting: Podiatry

## 2022-06-18 DIAGNOSIS — M79674 Pain in right toe(s): Secondary | ICD-10-CM

## 2022-06-18 DIAGNOSIS — M216X9 Other acquired deformities of unspecified foot: Secondary | ICD-10-CM

## 2022-06-18 DIAGNOSIS — Q828 Other specified congenital malformations of skin: Secondary | ICD-10-CM

## 2022-06-18 DIAGNOSIS — B351 Tinea unguium: Secondary | ICD-10-CM

## 2022-06-18 DIAGNOSIS — N182 Chronic kidney disease, stage 2 (mild): Secondary | ICD-10-CM

## 2022-06-18 DIAGNOSIS — M79675 Pain in left toe(s): Secondary | ICD-10-CM

## 2022-06-18 NOTE — Progress Notes (Signed)
This patient returns to my office for at risk foot care.  This patient requires this care by a professional since this patient will be at risk due to having chronic kidney disease.  Patient has painful callus on the outside bottom of both feet.  This patient is unable to cut nails himself since the patient cannot reach his nails.These nails are painful walking and wearing shoes. Patient has painful callus on the outside ball both feet.  Patient is unable to self treat.   This patient presents for at risk foot care today.  General Appearance  Alert, conversant and in no acute stress.  Vascular  Dorsalis pedis and posterior tibial  pulses are palpable  bilaterally.  Capillary return is within normal limits  bilaterally. Temperature is within normal limits  bilaterally.  Neurologic  Senn-Weinstein monofilament wire test within normal limits  bilaterally. Muscle power within normal limits bilaterally.  Nails Thick disfigured discolored nails with subungual debris  from hallux to fifth toes bilaterally. No evidence of bacterial infection or drainage bilaterally.  Orthopedic  No limitations of motion  feet .  No crepitus or effusions noted.  No bony pathology or digital deformities noted.  Skin  normotropic skin with no porokeratosis noted bilaterally.  No signs of infections or ulcers noted.    Onychomycosis  Pain in right toes  Pain in left foot   Consent was obtained for treatment procedures.   Mechanical debridement of nails 1-5  bilaterally performed with a nail nipper.  Filed with dremel without incident. No infection or ulcer.  .   Return office visit     3 months                 Told patient to return for periodic foot care and evaluation due to potential at risk complications.   Gardiner Barefoot DPM

## 2022-06-23 ENCOUNTER — Other Ambulatory Visit: Payer: Self-pay | Admitting: Family Medicine

## 2022-06-27 ENCOUNTER — Encounter: Payer: Self-pay | Admitting: Family Medicine

## 2022-06-27 ENCOUNTER — Ambulatory Visit (INDEPENDENT_AMBULATORY_CARE_PROVIDER_SITE_OTHER): Payer: Medicare HMO | Admitting: Family Medicine

## 2022-06-27 VITALS — BP 160/70 | HR 65 | Temp 98.2°F | Ht 67.0 in | Wt 247.0 lb

## 2022-06-27 DIAGNOSIS — M109 Gout, unspecified: Secondary | ICD-10-CM | POA: Diagnosis not present

## 2022-06-27 DIAGNOSIS — N182 Chronic kidney disease, stage 2 (mild): Secondary | ICD-10-CM | POA: Diagnosis not present

## 2022-06-27 DIAGNOSIS — Z0001 Encounter for general adult medical examination with abnormal findings: Secondary | ICD-10-CM | POA: Diagnosis not present

## 2022-06-27 DIAGNOSIS — I10 Essential (primary) hypertension: Secondary | ICD-10-CM

## 2022-06-27 DIAGNOSIS — Z125 Encounter for screening for malignant neoplasm of prostate: Secondary | ICD-10-CM

## 2022-06-27 DIAGNOSIS — I779 Disorder of arteries and arterioles, unspecified: Secondary | ICD-10-CM | POA: Diagnosis not present

## 2022-06-27 DIAGNOSIS — J309 Allergic rhinitis, unspecified: Secondary | ICD-10-CM | POA: Diagnosis not present

## 2022-06-27 DIAGNOSIS — R739 Hyperglycemia, unspecified: Secondary | ICD-10-CM

## 2022-06-27 DIAGNOSIS — E7800 Pure hypercholesterolemia, unspecified: Secondary | ICD-10-CM

## 2022-06-27 MED ORDER — NITROGLYCERIN 0.4 MG SL SUBL: 0.4000 mg | SUBLINGUAL_TABLET | SUBLINGUAL | 6 refills | Status: AC | PRN

## 2022-06-27 NOTE — Assessment & Plan Note (Signed)
Check labs 

## 2022-06-27 NOTE — Progress Notes (Signed)
Chief Complaint:  Todd Spears is a 77 y.o. male who presents today for his annual comprehensive physical exam.    Assessment/Plan:  Chronic Problems Addressed Today: Hyperlipidemia On pravastatin 10 mg daily.  Check lipids.  CKD (chronic kidney disease) stage 2, GFR 60-89 ml/min Check labs.  Hyperglycemia Check A1c.  Last A1c 6.2.  Gout Check uric acid.  No recent flares.  Carotid arterial disease (HCC) Continue aspirin and statin.  Allergic rhinitis Still has some symptoms but overall stable on Claritin, Singulair, and Astelin.  Discussed referral to ENT however he deferred.  Essential hypertension Elevated today. Left before we could recheck. His home readings are usually better controlled and averaging in the 140s. We did discuss medication changes today however will continue to monitor. He was at goal at his last office visit. Continue norvasc '10mg'$  daily, lisinopril '40mg'$ , metoprolol succinate '50mg'$  daily, and hydralazine '100mg'$  three times daily   Cerumen Build Up Irrigated by RMA today. Tolerated well.   Preventative Healthcare: Check labs.  He will get shingles vaccine at the pharmacy.  No longer needs colonoscopy due to age.  Patient Counseling(The following topics were reviewed and/or handout was given):  -Nutrition: Stressed importance of moderation in sodium/caffeine intake, saturated fat and cholesterol, caloric balance, sufficient intake of fresh fruits, vegetables, and fiber.  -Stressed the importance of regular exercise.   -Substance Abuse: Discussed cessation/primary prevention of tobacco, alcohol, or other drug use; driving or other dangerous activities under the influence; availability of treatment for abuse.   -Injury prevention: Discussed safety belts, safety helmets, smoke detector, smoking near bedding or upholstery.   -Sexuality: Discussed sexually transmitted diseases, partner selection, use of condoms, avoidance of unintended pregnancy and  contraceptive alternatives.   -Dental health: Discussed importance of regular tooth brushing, flossing, and dental visits.  -Health maintenance and immunizations reviewed. Please refer to Health maintenance section.  Return to care in 1 year for next preventative visit.     Subjective:  HPI:  He has no acute complaints today.   Lifestyle Diet: Cutting down on fried foods.  Exercise: Gets steps in work.      06/27/2022    1:48 PM  Depression screen PHQ 2/9  Decreased Interest 0  Down, Depressed, Hopeless 0  PHQ - 2 Score 0   ROS: Per HPI, otherwise a complete review of systems was negative.   PMH:  The following were reviewed and entered/updated in epic: Past Medical History:  Diagnosis Date   Allergic rhinitis    Anemia    Asthma    CAD (coronary artery disease)    a. s/p PTCA/stenting of the mid LAD and mid RCA 04/06/12   Carotid bruit    r   Colon polyp    Diverticulosis    ED (erectile dysfunction)    Fatty liver    GERD (gastroesophageal reflux disease)    Gout    Hyperlipidemia    Hypertension    Obesity    Osteoarthritis    Renal insufficiency    Restless leg syndrome    Patient Active Problem List   Diagnosis Date Noted   Hyperglycemia 04/13/2020   Pain due to onychomycosis of toenails of both feet 11/18/2018   Porokeratosis 11/18/2018   Gout 06/18/2018   Osteoarthritis 06/18/2018   Carotid arterial disease ([place]) 08/10/2015   Aortic stenosis, mild 08/10/2015   CKD (chronic kidney disease) stage 2, GFR 60-89 ml/min 07/03/2015   CAD s/p PCI 2013    Hyperlipidemia 11/27/2006  Essential hypertension 11/27/2006   Allergic rhinitis 11/27/2006   GERD 11/27/2006   Past Surgical History:  Procedure Laterality Date   BACK SURGERY  2020   COLONOSCOPY  04/23/2016   Dr.Perry   INGUINAL HERNIA REPAIR Right 02/27/2016   Procedure: HERNIA REPAIR INGUINAL ADULT;  Surgeon: Rolm Bookbinder, MD;  Location: [facility];  Service: General;   Laterality: Right;   INSERTION OF MESH Right 02/27/2016   Procedure: INSERTION OF MESH--IN AND OUT CATHETERIZATION PERFORMED AT END OF PROCEDURE TO DRAIN 400ML URINE.;  Surgeon: Rolm Bookbinder, MD;  Location: [facility];  Service: General;  Laterality: Right;   LYMPH NODE BIOPSY     right side of neck 1999 - benign   PERCUTANEOUS CORONARY STENT INTERVENTION (PCI-S) N/A 04/06/2012   Procedure: PERCUTANEOUS CORONARY STENT INTERVENTION (PCI-S);  Surgeon: Peter M Martinique, MD; Promus DES for 90% mid LAD and 95% mid RCA disease     UPPER GASTROINTESTINAL ENDOSCOPY  04/23/2016   Dr.Perry    Family History  Problem Relation Age of Onset   Hypertension Mother    Stroke Mother        Several    Stomach cancer Mother    Glaucoma Mother    Coronary artery disease Father        in his 67s   Heart disease Father    Heart attack Father    Hypertension Paternal Grandmother    Hypertension Paternal Grandfather    Colon cancer Neg Hx    Rectal cancer Neg Hx     Medications- reviewed and updated Current Outpatient Medications  Medication Sig Dispense Refill   albuterol (VENTOLIN HFA) 108 (90 Base) MCG/ACT inhaler INHALE 2 PUFFS INTO LUNGS EVERY 6 HOURS AS NEEDED FOR SHORTNESS OF BREATH 8.5 each 3   amLODipine (NORVASC) 10 MG tablet TAKE 1 TABLET BY MOUTH EVERY DAY 90 tablet 2   aspirin 81 MG chewable tablet Chew 81 mg by mouth daily.     Azelastine HCl 137 MCG/SPRAY SOLN INSTILL 2 SPRAYS INTO BOTH NOSTRILS 2 TIMES DAILY AS DIRECTED 90 mL 0   cetirizine (ZYRTEC) 10 MG tablet Take 10 mg by mouth every morning.     Cyanocobalamin (VITAMIN B 12 PO) Take 1 tablet by mouth daily.      diclofenac sodium (VOLTAREN) 1 % GEL Apply 2 g topically 4 (four) times daily. 100 g 3   esomeprazole (NEXIUM) 40 MG capsule TAKE ONE CAPSULE BY MOUTH DAILY BEFORE MEALS 90 capsule 2   ezetimibe (ZETIA) 10 MG tablet TAKE 1 TABLET BY MOUTH EVERY DAY 90 tablet 3   FeFum-FePoly-FA-B Cmp-C-Biot (INTEGRA  PLUS) CAPS TAKE ONE CAPSULE BY MOUTH DAILY 90 capsule 3   hydrALAZINE (APRESOLINE) 50 MG tablet TAKE 2 TABLETS BY MOUTH 3 TIMES A DAY 540 tablet 3   lisinopril (ZESTRIL) 40 MG tablet TAKE 1 TABLET BY MOUTH EVERY DAY 90 tablet 1   loratadine (CLARITIN) 10 MG tablet Take 10 mg by mouth at bedtime.      LUMIGAN 0.01 % SOLN Place 1 drop into the left eye at bedtime.     metoprolol succinate (TOPROL-XL) 50 MG 24 hr tablet TAKE 1 TABLET EVERY DAY WITH OR IMMEDIATELY FOLLOWING A MEAL 90 tablet 2   montelukast (SINGULAIR) 10 MG tablet TAKE 1 TABLET BY MOUTH EVERYDAY AT BEDTIME 90 tablet 3   Multiple Vitamin (MULTIVITAMIN WITH MINERALS) TABS tablet Take 1 tablet by mouth daily.     pravastatin (PRAVACHOL) 10 MG tablet TAKE  1 TABLET EVERY OTHER DAY, ALTERNATING WITH 2 TABLETS. 135 tablet 1   traMADol (ULTRAM) 50 MG tablet Take 1 tablet (50 mg total) by mouth every 12 (twelve) hours. 60 tablet 5   nitroGLYCERIN (NITROSTAT) 0.4 MG SL tablet Place 1 tablet (0.4 mg total) under the tongue every 5 (five) minutes as needed for chest pain (up to 3 doses. not within 24hrs of Viagra). 25 tablet 6   No current facility-administered medications for this visit.    Allergies-reviewed and updated Allergies  Allergen Reactions   Prednisone Shortness Of Breath and Swelling   Crestor [Rosuvastatin Calcium] Other (See Comments)    myalgia   Gabapentin Other (See Comments)   Lyrica [Pregabalin]     "swelling"    Social History   Socioeconomic History   Marital status: Married    Spouse name: Not on file   Number of children: 1   Years of education: Not on file   Highest education level: Not on file  Occupational History    Employer: [employer]  Tobacco Use   Smoking status: Former    Types: Cigarettes    Quit date: 06/04/1975    Years since quitting: 47.0   Smokeless tobacco: Never  Vaping Use   Vaping Use: Never used  Substance and Sexual Activity   Alcohol use: No    Alcohol/week: 0.0  standard drinks of alcohol   Drug use: No   Sexual activity: Not Currently  Other Topics Concern   Not on file  Social History Narrative   Works as a Engineer, building services    Married   One son ( lives in [place])    Regular exercise- no   Social Determinants of Health   Financial Resource Strain: Low Risk  (05/28/2022)   Overall Financial Resource Strain (CARDIA)    Difficulty of Paying Living Expenses: Not hard at all  Food Insecurity: No Food Insecurity (05/28/2022)   Hunger Vital Sign    Worried About Running Out of Food in the Last Year: Never true    [place] in the Last Year: Never true  Transportation Needs: No Transportation Needs (05/28/2022)   PRAPARE - Hydrologist (Medical): No    Lack of Transportation (Non-Medical): No  Physical Activity: Inactive (05/28/2022)   Exercise Vital Sign    Days of Exercise per Week: 0 days    Minutes of Exercise per Session: 0 min  Stress: No Stress Concern Present (05/28/2022)   [place]    Feeling of Stress : Not at all  Social Connections: Moderately Integrated (05/28/2022)   Social Connection and Isolation Panel [NHANES]    Frequency of Communication with Friends and Family: More than three times a week    Frequency of Social Gatherings with Friends and Family: More than three times a week    Attends Religious Services: More than 4 times per year    Active Member of [employer] or Organizations: No    Attends Archivist Meetings: Never    Marital Status: Married        Objective:  Physical Exam: BP (!) 160/70   Pulse 65   Temp 98.2 F (36.8 C) (Temporal)   Ht '5\' 7"'$  (1.702 m)   Wt 247 lb (112 kg)   SpO2 98%   BMI 38.69 kg/m   Body mass index is 38.69 kg/m. Wt Readings from Last 3 Encounters:  06/27/22 247 lb (  112 kg)  05/28/22 252 lb (114.3 kg)  02/07/22 252 lb 6.4 oz (114.5 kg)   Gen: NAD, resting  comfortably HEENT: TMs normal bilaterally. Cerumen in bilateral EAC. OP clear. No thyromegaly noted.  CV: RRR with 2/6 systolic murmurs appreciated Pulm: NWOB, CTAB with no crackles, wheezes, or rhonchi GI: Normal bowel sounds present. Soft, Nontender, Nondistended. MSK: no edema, cyanosis, or clubbing noted Skin: warm, dry Neuro: CN2-12 grossly intact. Strength 5/5 in upper and lower extremities. Reflexes symmetric and intact bilaterally.  Psych: Normal affect and thought content     Wenceslao Loper M. Jerline Pain, MD 06/27/2022 3:01 PM

## 2022-06-27 NOTE — Assessment & Plan Note (Signed)
Continue aspirin and statin. 

## 2022-06-27 NOTE — Assessment & Plan Note (Addendum)
Elevated today. Left before we could recheck. His home readings are usually better controlled and averaging in the 140s. We did discuss medication changes today however will continue to monitor. He was at goal at his last office visit. Continue norvasc '10mg'$  daily, lisinopril '40mg'$ , metoprolol succinate '50mg'$  daily, and hydralazine '100mg'$  three times daily

## 2022-06-27 NOTE — Patient Instructions (Signed)
It was very nice to see you today!  Will check blood work today.  Will flush out your ears today.  I will refill your nitroglycerin.  Please monitor your blood pressure at home and let us know if it is persistently elevated.  We will see you back in 6 months.  Please come back to see me sooner if needed.  Take care, Dr Jerline Pain  PLEASE NOTE:  If you had any lab tests, please let us know if you have not heard back within a few days. You may see your results on mychart before we have a chance to review them but we will give you a call once they are reviewed by [place].   If we ordered any referrals today, please let us know if you have not heard from their office within the next week.   If you had any urgent prescriptions sent in today, please check with the pharmacy within an hour of our visit to make sure the prescription was transmitted appropriately.   Please try these tips to maintain a healthy lifestyle:  Eat at least 3 REAL meals and 1-2 snacks per day.  Aim for no more than 5 hours between eating.  If you eat breakfast, please do so within one hour of getting up.   Each meal should contain half fruits/vegetables, one quarter protein, and one quarter carbs (no bigger than a computer mouse)  Cut down on sweet beverages. This includes juice, soda, and sweet tea.   Drink at least 1 glass of water with each meal and aim for at least 8 glasses per day  Exercise at least 150 minutes every week.    Preventive Care 58 Years and Older, Male Preventive care refers to lifestyle choices and visits with your health care provider that can promote health and wellness. Preventive care visits are also called wellness exams. What can I expect for my preventive care visit? Counseling During your preventive care visit, your health care provider may ask about your: Medical history, including: Past medical problems. Family medical history. History of falls. Current health, including: Emotional  well-being. Home life and relationship well-being. Sexual activity. Memory and ability to understand (cognition). Lifestyle, including: Alcohol, nicotine or tobacco, and drug use. Access to firearms. Diet, exercise, and sleep habits. Work and work Statistician. Sunscreen use. Safety issues such as seatbelt and bike helmet use. Physical exam Your health care provider will check your: Height and weight. These may be used to calculate your BMI (body mass index). BMI is a measurement that tells if you are at a healthy weight. Waist circumference. This measures the distance around your waistline. This measurement also tells if you are at a healthy weight and may help predict your risk of certain diseases, such as type 2 diabetes and high blood pressure. Heart rate and blood pressure. Body temperature. Skin for abnormal spots. What immunizations do I need?  Vaccines are usually given at various ages, according to a schedule. Your health care provider will recommend vaccines for you based on your age, medical history, and lifestyle or other factors, such as travel or where you work. What tests do I need? Screening Your health care provider may recommend screening tests for certain conditions. This may include: Lipid and cholesterol levels. Diabetes screening. This is done by checking your blood sugar (glucose) after you have not eaten for a while (fasting). Hepatitis C test. Hepatitis B test. HIV (human immunodeficiency virus) test. STI (sexually transmitted infection) testing, if you are at  risk. Lung cancer screening. Colorectal cancer screening. Prostate cancer screening. Abdominal aortic aneurysm (AAA) screening. You may need this if you are a current or former smoker. Talk with your health care provider about your test results, treatment options, and if necessary, the need for more tests. Follow these instructions at home: Eating and drinking  Eat a diet that includes fresh fruits  and vegetables, whole grains, lean protein, and low-fat dairy products. Limit your intake of foods with high amounts of sugar, saturated fats, and salt. Take vitamin and mineral supplements as recommended by your health care provider. Do not drink alcohol if your health care provider tells you not to drink. If you drink alcohol: Limit how much you have to 0-2 drinks a day. Know how much alcohol is in your drink. In the [place] one drink equals one 12 oz bottle of beer (355 mL), one 5 oz glass of wine (148 mL), or one 1 oz glass of hard liquor (44 mL). Lifestyle Brush your teeth every morning and night with fluoride toothpaste. Floss one time each day. Exercise for at least 30 minutes 5 or more days each week. Do not use any products that contain nicotine or tobacco. These products include cigarettes, chewing tobacco, and vaping devices, such as e-cigarettes. If you need help quitting, ask your health care provider. Do not use drugs. If you are sexually active, practice safe sex. Use a condom or other form of protection to prevent STIs. Take aspirin only as told by your health care provider. Make sure that you understand how much to take and what form to take. Work with your health care provider to find out whether it is safe and beneficial for you to take aspirin daily. Ask your health care provider if you need to take a cholesterol-lowering medicine (statin). Find healthy ways to manage stress, such as: Meditation, yoga, or listening to music. Journaling. Talking to a trusted person. Spending time with friends and family. Safety Always wear your seat belt while driving or riding in a vehicle. Do not drive: If you have been drinking alcohol. Do not ride with someone who has been drinking. When you are tired or distracted. While texting. If you have been using any mind-altering substances or drugs. Wear a helmet and other protective equipment during sports activities. If you have firearms in  your house, make sure you follow all gun safety procedures. Minimize exposure to UV radiation to reduce your risk of skin cancer. What's next? Visit your health care provider once a year for an annual wellness visit. Ask your health care provider how often you should have your eyes and teeth checked. Stay up to date on all vaccines. This information is not intended to replace advice given to you by your health care provider. Make sure you discuss any questions you have with your health care provider. Document Revised: 11/15/2020 Document Reviewed: 11/15/2020 Elsevier Patient Education  [place].

## 2022-06-27 NOTE — Assessment & Plan Note (Signed)
Still has some symptoms but overall stable on Claritin, Singulair, and Astelin.  Discussed referral to ENT however he deferred.

## 2022-06-27 NOTE — Assessment & Plan Note (Signed)
Check uric acid.  No recent flares.

## 2022-06-27 NOTE — Assessment & Plan Note (Signed)
On pravastatin 10 mg daily.  Check lipids.

## 2022-06-27 NOTE — Assessment & Plan Note (Signed)
Check A1c.  Last A1c 6.2.

## 2022-06-28 LAB — LDL CHOLESTEROL, DIRECT: Direct LDL: 71 mg/dL

## 2022-06-28 LAB — COMPREHENSIVE METABOLIC PANEL
ALT: 35 U/L (ref 0–53)
AST: 32 U/L (ref 0–37)
Albumin: 4.4 g/dL (ref 3.5–5.2)
Alkaline Phosphatase: 45 U/L (ref 39–117)
BUN: 22 mg/dL (ref 6–23)
CO2: 28 mEq/L (ref 19–32)
Calcium: 9.4 mg/dL (ref 8.4–10.5)
Chloride: 96 mEq/L (ref 96–112)
Creatinine, Ser: 1.05 mg/dL (ref 0.40–1.50)
GFR: 68.69 mL/min (ref >60.00)
Glucose, Bld: 124 mg/dL — ABNORMAL HIGH (ref 70–99)
Potassium: 4.7 mEq/L (ref 3.5–5.1)
Sodium: 132 mEq/L — ABNORMAL LOW (ref 135–145)
Total Bilirubin: 0.4 mg/dL (ref 0.2–1.2)
Total Protein: 7.3 g/dL (ref 6.0–8.3)

## 2022-06-28 LAB — LIPID PANEL
Cholesterol: 145 mg/dL (ref 0–200)
HDL: 43.9 mg/dL (ref >39.00)
NonHDL: 101.36
Total CHOL/HDL Ratio: 3
Triglycerides: 263 mg/dL — ABNORMAL HIGH (ref 0.0–149.0)
VLDL: 52.6 mg/dL — ABNORMAL HIGH (ref 0.0–40.0)

## 2022-06-28 LAB — CBC
HCT: 35.2 % — ABNORMAL LOW (ref 39.0–52.0)
Hemoglobin: 12.2 g/dL — ABNORMAL LOW (ref 13.0–17.0)
MCHC: 34.7 g/dL (ref 30.0–36.0)
MCV: 92 fl (ref 78.0–100.0)
Platelets: 254 10*3/uL (ref 150.0–400.0)
RBC: 3.83 Mil/uL — ABNORMAL LOW (ref 4.22–5.81)
RDW: 12.7 % (ref 11.5–15.5)
WBC: 7.2 10*3/uL (ref 4.0–10.5)

## 2022-06-28 LAB — URIC ACID: Uric Acid, Serum: 6.2 mg/dL (ref 4.0–7.8)

## 2022-06-28 LAB — HEMOGLOBIN A1C: Hgb A1c MFr Bld: 6 % (ref 4.6–6.5)

## 2022-06-28 LAB — PSA: PSA: 0.64 ng/mL (ref 0.10–4.00)

## 2022-06-28 LAB — TSH: TSH: 2.23 u[IU]/mL (ref 0.35–5.50)

## 2022-06-28 NOTE — Progress Notes (Signed)
Please inform patient of the following:  Sodium is down a little bit but all of his other labs are stable compared to the last time that we checked.  His low sodium may be a medication side effect but I would like to confirm before we make any medication changes.  Please have him come back to recheck.  Please place future order for bmet  We can recheck everything else in 6 months.

## 2022-07-02 DIAGNOSIS — N1832 Chronic kidney disease, stage 3b: Secondary | ICD-10-CM | POA: Diagnosis not present

## 2022-07-02 DIAGNOSIS — Z6841 Body Mass Index (BMI) 40.0 and over, adult: Secondary | ICD-10-CM | POA: Diagnosis not present

## 2022-07-02 DIAGNOSIS — M79642 Pain in left hand: Secondary | ICD-10-CM | POA: Diagnosis not present

## 2022-07-02 DIAGNOSIS — M1A09X0 Idiopathic chronic gout, multiple sites, without tophus (tophi): Secondary | ICD-10-CM | POA: Diagnosis not present

## 2022-07-02 DIAGNOSIS — M1991 Primary osteoarthritis, unspecified site: Secondary | ICD-10-CM | POA: Diagnosis not present

## 2022-07-02 DIAGNOSIS — M542 Cervicalgia: Secondary | ICD-10-CM | POA: Diagnosis not present

## 2022-07-03 ENCOUNTER — Other Ambulatory Visit: Payer: Self-pay | Admitting: *Deleted

## 2022-07-03 DIAGNOSIS — E871 Hypo-osmolality and hyponatremia: Secondary | ICD-10-CM

## 2022-07-10 DIAGNOSIS — H401121 Primary open-angle glaucoma, left eye, mild stage: Secondary | ICD-10-CM | POA: Diagnosis not present

## 2022-07-15 ENCOUNTER — Other Ambulatory Visit (INDEPENDENT_AMBULATORY_CARE_PROVIDER_SITE_OTHER): Payer: Medicare HMO

## 2022-07-15 DIAGNOSIS — E871 Hypo-osmolality and hyponatremia: Secondary | ICD-10-CM

## 2022-07-16 LAB — COMPREHENSIVE METABOLIC PANEL
ALT: 34 U/L (ref 0–53)
AST: 29 U/L (ref 0–37)
Albumin: 4.1 g/dL (ref 3.5–5.2)
Alkaline Phosphatase: 46 U/L (ref 39–117)
BUN: 21 mg/dL (ref 6–23)
CO2: 27 mEq/L (ref 19–32)
Calcium: 9.5 mg/dL (ref 8.4–10.5)
Chloride: 95 mEq/L — ABNORMAL LOW (ref 96–112)
Creatinine, Ser: 1.17 mg/dL (ref 0.40–1.50)
GFR: 60.3 mL/min (ref >60.00)
Glucose, Bld: 101 mg/dL — ABNORMAL HIGH (ref 70–99)
Potassium: 4.4 mEq/L (ref 3.5–5.1)
Sodium: 132 mEq/L — ABNORMAL LOW (ref 135–145)
Total Bilirubin: 0.4 mg/dL (ref 0.2–1.2)
Total Protein: 6.9 g/dL (ref 6.0–8.3)

## 2022-07-16 NOTE — Progress Notes (Signed)
Please inform patient of the following:  His sodium is still low.  This is probably due to a side effect of his lisinopril.  Recommend that he stop his lisinopril completely and come back in 1 week to recheck his sodium.  Please place future order for bmet.  It is very likely that his blood pressure will go up since we are stopping his lisinopril.  Would like for him to check in with [place] in a few days to let us know how his blood pressure is reading and we can adjust his other medications as needed.   [name]. Jerline Pain, MD 07/16/2022 1:03 PM

## 2022-07-17 ENCOUNTER — Telehealth: Payer: Self-pay

## 2022-07-17 NOTE — Progress Notes (Signed)
It has dropped since last year.  We can recheck again in a few weeks if he wishes though  recommend he schedule appointment to discuss.  [name]. Jerline Pain, MD 07/17/2022 11:10 AM

## 2022-07-17 NOTE — Telephone Encounter (Signed)
LMOVM advising patient to schedule appt with Dr. Jerline Pain to discuss sodium levels and possible change from lisinopril. Please schedule appt.     It has dropped since last year.  We can recheck again in a few weeks if he wishes though  recommend he schedule appointment to discuss.   [name]. Jerline Pain, MD 07/17/2022 11:10 AM

## 2022-07-18 DIAGNOSIS — G5603 Carpal tunnel syndrome, bilateral upper limbs: Secondary | ICD-10-CM | POA: Diagnosis not present

## 2022-07-31 ENCOUNTER — Other Ambulatory Visit: Payer: Self-pay | Admitting: Family Medicine

## 2022-08-02 ENCOUNTER — Ambulatory Visit (INDEPENDENT_AMBULATORY_CARE_PROVIDER_SITE_OTHER): Payer: Medicare HMO | Admitting: Family Medicine

## 2022-08-02 ENCOUNTER — Encounter: Payer: Self-pay | Admitting: Family Medicine

## 2022-08-02 VITALS — BP 166/69 | HR 67 | Temp 98.2°F | Ht 67.0 in | Wt 252.4 lb

## 2022-08-02 DIAGNOSIS — I10 Essential (primary) hypertension: Secondary | ICD-10-CM | POA: Diagnosis not present

## 2022-08-02 DIAGNOSIS — R29898 Other symptoms and signs involving the musculoskeletal system: Secondary | ICD-10-CM | POA: Diagnosis not present

## 2022-08-02 NOTE — Patient Instructions (Addendum)
It was very nice to see you today!  Please stop the lisinopril.   Please monitor your blood pressure at home and check in with [place] in a week or two.   Please come back in a week or 2 after stopping the lisinopril so that we can recheck your sodium.  Take care, Dr Jerline Pain  PLEASE NOTE:  If you had any lab tests, please let us know if you have not heard back within a few days. You may see your results on mychart before we have a chance to review them but we will give you a call once they are reviewed by [place].   If we ordered any referrals today, please let us know if you have not heard from their office within the next week.   If you had any urgent prescriptions sent in today, please check with the pharmacy within an hour of our visit to make sure the prescription was transmitted appropriately.   Please try these tips to maintain a healthy lifestyle:  Eat at least 3 REAL meals and 1-2 snacks per day.  Aim for no more than 5 hours between eating.  If you eat breakfast, please do so within one hour of getting up.   Each meal should contain half fruits/vegetables, one quarter protein, and one quarter carbs (no bigger than a computer mouse)  Cut down on sweet beverages. This includes juice, soda, and sweet tea.   Drink at least 1 glass of water with each meal and aim for at least 8 glasses per day  Exercise at least 150 minutes every week.

## 2022-08-02 NOTE — Assessment & Plan Note (Addendum)
Blood pressure elevated today.  As above he was found to be hyponatremic which was confirmed on recheck.  Concerned that this could be side effect of his lisinopril. We discussed medication adjustment options.  He has had hyponatremia in the past that resulted in hospitalization.  Due to risk of adverse outcomes with hyponatremia it would be reasonable to trial off lisinopril even though his blood pressure is above goal today.  He is agreeable to trial off his lisinopril.  Will continue his other medications with Norvasc 10 mg daily, metoprolol succinate 50 mg daily, and hydralazine 100 mg 3 times daily.  He will monitor his blood pressure at home over the next several days and let us know if he has any significant elevation in blood pressure readings or if they are above his typical normal range at home.  If his hyponatremia is improved off of lisinopril that his blood pressure is still not at goal we would increase dose of metoprolol succinate as tolerated versus addition of spironolactone.  If he does not have any improvement of his hyponatremia off lisinopril would be okay to restart.

## 2022-08-02 NOTE — Progress Notes (Signed)
   Todd Spears is a 77 y.o. male who presents today for an office visit.  Assessment/Plan:  New/Acute Problems: Hyponatremia Likely due to his lisinopril.  He has been borderline low in the past however did have a significant episode of hyponatremia a few years ago that resulted in hospitalization.  This was likely due to chlorthalidone which has since been discontinued.  We had lengthy discussion today regarding his blood pressure management.  He is agreeable to try off lisinopril to see if this improves his hyponatremia.  He will come back in a couple of weeks to recheck be met.  Chronic Problems Addressed Today: Essential hypertension Blood pressure elevated today.  As above he was found to be hyponatremic which was confirmed on recheck.  Concerned that this could be side effect of his lisinopril. We discussed medication adjustment options.  He has had hyponatremia in the past that resulted in hospitalization.  Due to risk of adverse outcomes with hyponatremia it would be reasonable to trial off lisinopril even though his blood pressure is above goal today.  He is agreeable to trial off his lisinopril.  Will continue his other medications with Norvasc 10 mg daily, metoprolol succinate 50 mg daily, and hydralazine 100 mg 3 times daily.  He will monitor his blood pressure at home over the next several days and let us know if he has any significant elevation in blood pressure readings or if they are above his typical normal range at home.  If his hyponatremia is improved off of lisinopril that his blood pressure is still not at goal we would increase dose of metoprolol succinate as tolerated versus addition of spironolactone.  If he does not have any improvement of his hyponatremia off lisinopril would be okay to restart.      Subjective:  HPI:  See A/p for status of chronic conditions.    Patient here for follow up. Was last seen here about 5 weeks ago for his annual physical. Was found  to be hyponatremic sodium 132.  This was confirmed on recheck.  He is here today to discuss this.       Objective:  Physical Exam: BP (!) 166/69   Pulse 67   Temp 98.2 F (36.8 C) (Temporal)   Ht '5\' 7"'$  (1.702 m)   Wt 252 lb 6.4 oz (114.5 kg)   SpO2 99%   BMI 39.53 kg/m   Gen: No acute distress, resting comfortably Neuro: Grossly normal, moves all extremities Psych: Normal affect and thought content      [name], MD 08/02/2022 7:55 AM

## 2022-08-07 DIAGNOSIS — G5602 Carpal tunnel syndrome, left upper limb: Secondary | ICD-10-CM | POA: Diagnosis not present

## 2022-08-14 ENCOUNTER — Other Ambulatory Visit: Payer: Self-pay | Admitting: Family Medicine

## 2022-08-14 DIAGNOSIS — J302 Other seasonal allergic rhinitis: Secondary | ICD-10-CM

## 2022-08-15 DIAGNOSIS — R29898 Other symptoms and signs involving the musculoskeletal system: Secondary | ICD-10-CM | POA: Diagnosis not present

## 2022-08-16 ENCOUNTER — Encounter: Payer: Self-pay | Admitting: Family Medicine

## 2022-08-16 ENCOUNTER — Ambulatory Visit (INDEPENDENT_AMBULATORY_CARE_PROVIDER_SITE_OTHER): Payer: Medicare HMO | Admitting: Family Medicine

## 2022-08-16 VITALS — BP 173/63 | HR 64 | Temp 97.8°F | Ht 67.0 in | Wt 249.0 lb

## 2022-08-16 DIAGNOSIS — I10 Essential (primary) hypertension: Secondary | ICD-10-CM | POA: Diagnosis not present

## 2022-08-16 DIAGNOSIS — M1990 Unspecified osteoarthritis, unspecified site: Secondary | ICD-10-CM | POA: Diagnosis not present

## 2022-08-16 LAB — BASIC METABOLIC PANEL
BUN: 20 mg/dL (ref 6–23)
CO2: 27 mEq/L (ref 19–32)
Calcium: 8.9 mg/dL (ref 8.4–10.5)
Chloride: 99 mEq/L (ref 96–112)
Creatinine, Ser: 1.11 mg/dL (ref 0.40–1.50)
GFR: 64.2 mL/min (ref >60.00)
Glucose, Bld: 112 mg/dL — ABNORMAL HIGH (ref 70–99)
Potassium: 4.4 mEq/L (ref 3.5–5.1)
Sodium: 134 mEq/L — ABNORMAL LOW (ref 135–145)

## 2022-08-16 MED ORDER — ALBUTEROL SULFATE HFA 108 (90 BASE) MCG/ACT IN AERS: 2.0000 | INHALATION_SPRAY | Freq: Four times a day (QID) | RESPIRATORY_TRACT | 3 refills | Status: DC | PRN

## 2022-08-16 MED ORDER — TRAMADOL HCL 50 MG PO TABS: 50.0000 mg | ORAL_TABLET | Freq: Two times a day (BID) | ORAL | 5 refills | Status: DC

## 2022-08-16 MED ORDER — SPIRONOLACTONE 25 MG PO TABS: 25.0000 mg | ORAL_TABLET | Freq: Every day | ORAL | 3 refills | Status: DC

## 2022-08-16 NOTE — Progress Notes (Signed)
   Todd Spears is a 77 y.o. male who presents today for an office visit.  Assessment/Plan:  New/Acute Problems: Hyponatremia Likely medication side effect and lisinopril.  He has been off of this for 2 weeks.  Will recheck be met today.  Chronic Problems Addressed Today: Essential hypertension Blood pressure actually improved today since coming off lisinopril 2 weeks ago.  He is currently on Norvasc 10 mg daily, metoprolol 50 mg daily, and hydralazine 100 mg 3 times daily.  Blood pressure is not at goal here or at home.  We discussed additional treatment options.  We did discuss potentially trying losartan however given concern for hyponatremia due to lisinopril we will hold off on this for now.  We will add on spironolactone 25 mg daily.  Discussed potential side effects.  He will follow-up with me in a couple of weeks and we can titrate the dose as needed.  Osteoarthritis Overall stable.  Uses tramadol very infrequently as needed.  Last refill about 5 months ago.  Tolerating well without any significant side effects.  Will refill today.     Subjective:  HPI:  Patient here today for follow-up.  We saw him 2 weeks ago for blood pressure follow-up.  There was concern for hyponatremia as well.  He was continued on Norvasc 10 mg daily, metoprolol succinate 50 mg daily, and hydralazine 100 mg 3 times daily.  He has done well off lisinopril.  Home blood pressure readings typically in the 140s over 70s.  Does have occasional readings in the 150s and 160s.  A few readings into the 130s.       Objective:  Physical Exam: BP (!) 159/63   Pulse 64   Temp 97.8 F (36.6 C) (Temporal)   Ht 5\' 7"  (1.702 m)   Wt 249 lb (112.9 kg)   SpO2 96%   BMI 39.00 kg/m   Gen: No acute distress, resting comfortably CV: Regular rate and rhythm with no murmurs appreciated Pulm: Normal work of breathing, clear to auscultation bilaterally with no crackles, wheezes, or rhonchi Neuro: Grossly normal, moves all  extremities Psych: Normal affect and thought content      Todd Spears M. Jerline Pain, MD 08/16/2022 7:48 AM

## 2022-08-16 NOTE — Assessment & Plan Note (Signed)
Overall stable.  Uses tramadol very infrequently as needed.  Last refill about 5 months ago.  Tolerating well without any significant side effects.  Will refill today.

## 2022-08-16 NOTE — Patient Instructions (Signed)
It was very nice to see you today!  Please start the spironolactone.  Will check blood work today.  I will refill your albuterol and tramadol today.  Please monitor your blood pressure and follow-up with [place] in a couple of weeks.  Take care, Dr Jerline Pain  PLEASE NOTE:  If you had any lab tests, please let us know if you have not heard back within a few days. You may see your results on mychart before we have a chance to review them but we will give you a call once they are reviewed by [place].   If we ordered any referrals today, please let us know if you have not heard from their office within the next week.   If you had any urgent prescriptions sent in today, please check with the pharmacy within an hour of our visit to make sure the prescription was transmitted appropriately.   Please try these tips to maintain a healthy lifestyle:  Eat at least 3 REAL meals and 1-2 snacks per day.  Aim for no more than 5 hours between eating.  If you eat breakfast, please do so within one hour of getting up.   Each meal should contain half fruits/vegetables, one quarter protein, and one quarter carbs (no bigger than a computer mouse)  Cut down on sweet beverages. This includes juice, soda, and sweet tea.   Drink at least 1 glass of water with each meal and aim for at least 8 glasses per day  Exercise at least 150 minutes every week.

## 2022-08-16 NOTE — Assessment & Plan Note (Signed)
Blood pressure actually improved today since coming off lisinopril 2 weeks ago.  He is currently on Norvasc 10 mg daily, metoprolol 50 mg daily, and hydralazine 100 mg 3 times daily.  Blood pressure is not at goal here or at home.  We discussed additional treatment options.  We did discuss potentially trying losartan however given concern for hyponatremia due to lisinopril we will hold off on this for now.  We will add on spironolactone 25 mg daily.  Discussed potential side effects.  He will follow-up with me in a couple of weeks and we can titrate the dose as needed.

## 2022-08-19 NOTE — Progress Notes (Signed)
Please inform patient of the following:  Sodium is improving.  We can stay off the lisinopril.  I would like for him to come back in a couple of weeks to recheck blood work one more time since we started the spironolactone.  He should let us know how his blood pressures are reading over the next couple of weeks.

## 2022-08-20 NOTE — Telephone Encounter (Signed)
I appreciate the update. HE should let us know if it is not improving.  Would like for him to check back in within a few days.  [name]. Jerline Pain, MD 08/20/2022 10:19 AM

## 2022-08-21 ENCOUNTER — Telehealth: Payer: Self-pay | Admitting: Family Medicine

## 2022-08-21 NOTE — Telephone Encounter (Signed)
See note

## 2022-08-21 NOTE — Telephone Encounter (Signed)
Pt told to see pcp within 3 days. Patient has been scheduled for 3/22 @ 7:20am.  Patient Name: Todd Spears INS Gender: Male DOB: 11-01-1945 Age: 77 Y 2 M 15 D Return Phone Number: [phone] (Primary), [phone] (Secondary) Address: City/ State/ Zip: [place]  [address] Client [place] [facility] [place] [facility] [place] [facility] Provider Dimas Chyle- MD Contact Type Call Who Is Calling Patient / Member / Family / Caregiver Call Type Triage / Clinical Relationship To Patient Self Return Phone Number [phone] (Primary) Chief Complaint Blood Pressure High Reason for Call Symptomatic / Request for Health Information Initial Comment Office transferring call patient started new blood pressure medication last Friday and now his pressure has been elevated at 186/90 Translation No Nurse Assessment Nurse: Zorita Pang, RN, Neoma Laming Date/Time (Eastern Time): 08/21/2022 1:28:04 PM Confirm and document reason for call. If symptomatic, describe symptoms. ---Caller states that his physical that his sodium was low so they stopped the Lisinopril and put on a new BP med. Now his BP is 186/90. Does not know what his BP was when he was in the office. This week he states that his BP is 160s to 170s this week. Does the patient have any new or worsening symptoms? ---Yes Will a triage be completed? ---Yes Related visit to physician within the last 2 weeks? ---Yes Does the PT have any chronic conditions? (i.e. diabetes, asthma, this includes High risk factors for pregnancy, etc.) ---Yes List chronic conditions. ---hypertension, heart disease and had stents two years ago Is this a behavioral health or substance abuse call? ---No Guidelines Guideline Title Affirmed Question Affirmed Notes Nurse Date/Time (Eastern Time) Blood Pressure - High Systolic BP >= 0000000 OR Diastolic >= 123XX123 Womble, RN, Neoma Laming 08/21/2022 1:31:30 PM PLEASE  NOTE: All timestamps contained within this report are represented as [place] Standard Time. CONFIDENTIALTY NOTICE: This fax transmission is intended only for the addressee. It contains information that is legally privileged, confidential or otherwise protected from use or disclosure. If you are not the intended recipient, you are strictly prohibited from reviewing, disclosing, copying using or disseminating any of this information or taking any action in reliance on or regarding this information. If you have received this fax in error, please notify us immediately by telephone so that we can arrange for its return to [place]. Phone: [phone], Toll-Free: [phone], Fax: [phone] Page: 2 of 2 Call Id: [id number] [place]. Time Eilene Ghazi Time) Disposition Final User 08/21/2022 12:59:20 PM Attempt made - message left Quentin Ore RN, April 08/21/2022 1:22:50 PM Send To RN Personal Quentin Ore, RN, April 08/21/2022 1:37:50 PM SEE PCP WITHIN 3 DAYS Yes Zorita Pang, RN, Deborah Final Disposition 08/21/2022 1:37:50 PM SEE PCP WITHIN 3 DAYS Yes Womble, RN, Solicitor Disagree/Comply Comply Caller Understands Yes PreDisposition Call Doctor Care Advice Given Per Guideline SEE PCP WITHIN 3 DAYS: CALL BACK IF: * Difficulty walking, difficulty talking, or severe headache occurs * Chest pain or difficulty breathing occurs * Your blood pressure is over 180/110 Comments User: Marquis Buggy, RN Date/Time (Eastern Time): 08/21/2022 1:40:08 PM Caller states that he just had one BP systolic reading greater than 180. Other readings are 163/78, 175/86. 148/73 and 163/86. He denies any other symptoms. He was encouraged to call back if he worsens but otherwise keep his scheduled appointment in 2 weeks and he verbalized understanding

## 2022-08-21 NOTE — Telephone Encounter (Signed)
Patient states: -Started new bp med, spironolactone 25 mg this weekend; BP levels have become even more elevated - Highest BP reading has been 186/90; others were 160/80, 175/86, 163/78 - No other symptoms  Patient has been transferred to triage.

## 2022-08-22 ENCOUNTER — Other Ambulatory Visit: Payer: Self-pay

## 2022-08-22 NOTE — Telephone Encounter (Signed)
Patient aware to increase Spironolactone to 50 mg. Scheduled for tomorrow at 720 with PCP.

## 2022-08-22 NOTE — Telephone Encounter (Signed)
Recommend he increase to 50mg  daily and come in soon for recheck.  [name]. Jerline Pain, MD 08/22/2022 11:29 AM

## 2022-08-23 ENCOUNTER — Encounter: Payer: Self-pay | Admitting: Family Medicine

## 2022-08-23 ENCOUNTER — Ambulatory Visit (INDEPENDENT_AMBULATORY_CARE_PROVIDER_SITE_OTHER): Payer: Medicare HMO | Admitting: Family Medicine

## 2022-08-23 VITALS — BP 188/70 | HR 64 | Temp 97.8°F | Ht 67.0 in | Wt 244.8 lb

## 2022-08-23 DIAGNOSIS — I10 Essential (primary) hypertension: Secondary | ICD-10-CM | POA: Diagnosis not present

## 2022-08-23 NOTE — Assessment & Plan Note (Addendum)
Blood pressure still significantly elevated today despite addition of spironolactone.  He is also still getting elevated readings at home.  Will increase his spironolactone to 50 mg daily and continue his Norvasc 10 mg daily, metoprolol succinate 50 mg daily, and hydralazine 100 mg 3 times daily.  Discussed with patient that I do not have much room to adjust medications further at this point.  Would avoid increasing dose of beta-blocker at this point given his heart rate in the low 60s.  Would avoid going back to ACE/ARB at this point due to history of hyponatremia always.   He has resistant hypertension.  We will refer him to advanced hypertension clinic.  He will continue monitor his blood pressure at home.  He is aware of reasons to return to care or seek emergent care.  He does have lab work scheduled in a couple of weeks to recheck BMET since starting spironolactone.

## 2022-08-23 NOTE — Patient Instructions (Signed)
It was very nice to see you today!  We need to get your blood pressure under better control.  Please increase your spironolactone to 50 mg daily.  We will continue your other medications.   I will refer you to see the blood pressure specialist.  Please send me a message next week to let me know how your blood pressure is looking.  Please let us know if you develop any symptoms.  Take care, Dr Jerline Pain  PLEASE NOTE:  If you had any lab tests, please let us know if you have not heard back within a few days. You may see your results on mychart before we have a chance to review them but we will give you a call once they are reviewed by [place].   If we ordered any referrals today, please let us know if you have not heard from their office within the next week.   If you had any urgent prescriptions sent in today, please check with the pharmacy within an hour of our visit to make sure the prescription was transmitted appropriately.   Please try these tips to maintain a healthy lifestyle:  Eat at least 3 REAL meals and 1-2 snacks per day.  Aim for no more than 5 hours between eating.  If you eat breakfast, please do so within one hour of getting up.   Each meal should contain half fruits/vegetables, one quarter protein, and one quarter carbs (no bigger than a computer mouse)  Cut down on sweet beverages. This includes juice, soda, and sweet tea.   Drink at least 1 glass of water with each meal and aim for at least 8 glasses per day  Exercise at least 150 minutes every week.

## 2022-08-23 NOTE — Progress Notes (Signed)
   Todd Spears is a 77 y.o. male who presents today for an office visit.  Assessment/Plan:  New/Acute Problems: Cerumen Impaction Successfully irrigated by RMA today. Can use OTC debrox as needed to prevent recurrence.   Chronic Problems Addressed Today: Essential hypertension Blood pressure still significantly elevated today despite addition of spironolactone.  He is also still getting elevated readings at home.  Will increase his spironolactone to 50 mg daily and continue his Norvasc 10 mg daily, metoprolol succinate 50 mg daily, and hydralazine 100 mg 3 times daily.  Discussed with patient that I do not have much room to adjust medications further at this point.  Would avoid increasing dose of beta-blocker at this point given his heart rate in the low 60s.  Would avoid going back to ACE/ARB at this point due to history of hyponatremia always.   He has resistant hypertension.  We will refer him to advanced hypertension clinic.  He will continue monitor his blood pressure at home.  He is aware of reasons to return to care or seek emergent care.  He does have lab work scheduled in a couple of weeks to recheck BMET since starting spironolactone.      Subjective:  HPI:  See A/p for status of chronic conditions.  Patient here today for blood pressure follow up.  I have seen him 3 times in the past 3 weeks for blood pressure management. We last saw him a week ago.  Blood pressure was not controlled at that time.  We added on spironolactone 25 mg daily.  He was continued on Norvasc 10 mg daily, metoprolol succinate 50 mg daily, and hydralazine 100 mg 3 times daily.  Blood pressure has been persistently elevated at home.  Typically in the 150s to 160s though does have occasional readings in the 170s and 180s.  He is currently asymptomatic.  No chest Spears.  No dizziness.  No shortness of breath.  No headache.  No vision changes.  He would also like to have [place] look in his right ear today.   Believes that he has earwax buildup.         Objective:  Physical Exam: BP (!) 188/70   Pulse 64   Temp 97.8 F (36.6 C) (Temporal)   Ht 5\' 7"  (1.702 m)   Wt 244 lb 12.8 oz (111 kg)   SpO2 99%   BMI 38.34 kg/m   Gen: No acute distress, resting comfortably HEENT: Left TM clear.  Right EAC with cerumen. CV: Regular rate and rhythm with 2/6 systolic murmur appreciated Pulm: Normal work of breathing, clear to auscultation bilaterally with no crackles, wheezes, or rhonchi Neuro: Grossly normal, moves all extremities Psych: Normal affect and thought content      Todd Spears M. Todd Pain, MD 08/23/2022 8:08 AM

## 2022-09-06 ENCOUNTER — Other Ambulatory Visit: Payer: Self-pay | Admitting: Family Medicine

## 2022-09-06 DIAGNOSIS — J302 Other seasonal allergic rhinitis: Secondary | ICD-10-CM

## 2022-09-17 ENCOUNTER — Encounter: Payer: Self-pay | Admitting: Podiatry

## 2022-09-17 ENCOUNTER — Ambulatory Visit: Payer: Medicare HMO | Admitting: Podiatry

## 2022-09-17 DIAGNOSIS — Q828 Other specified congenital malformations of skin: Secondary | ICD-10-CM

## 2022-09-17 DIAGNOSIS — B351 Tinea unguium: Secondary | ICD-10-CM | POA: Diagnosis not present

## 2022-09-17 DIAGNOSIS — M79675 Pain in left toe(s): Secondary | ICD-10-CM

## 2022-09-17 DIAGNOSIS — M216X9 Other acquired deformities of unspecified foot: Secondary | ICD-10-CM | POA: Diagnosis not present

## 2022-09-17 DIAGNOSIS — N182 Chronic kidney disease, stage 2 (mild): Secondary | ICD-10-CM | POA: Diagnosis not present

## 2022-09-17 DIAGNOSIS — M79674 Pain in right toe(s): Secondary | ICD-10-CM | POA: Diagnosis not present

## 2022-09-17 NOTE — Progress Notes (Signed)
This patient returns to my office for at risk foot care.  This patient requires this care by a professional since this patient will be at risk due to having chronic kidney disease.  Patient has painful callus on the outside bottom of both feet.  This patient is unable to cut nails himself since the patient cannot reach his nails.These nails are painful walking and wearing shoes. Patient has painful callus on the outside ball both feet.  Patient is unable to self treat.   This patient presents for at risk foot care today.  General Appearance  Alert, conversant and in no acute stress.  Vascular  Dorsalis pedis and posterior tibial  pulses are palpable  bilaterally.  Capillary return is within normal limits  bilaterally. Temperature is within normal limits  bilaterally.  Neurologic  Senn-Weinstein monofilament wire test within normal limits  bilaterally. Muscle power within normal limits bilaterally.  Nails Thick disfigured discolored nails with subungual debris  from hallux to fifth toes bilaterally. No evidence of bacterial infection or drainage bilaterally.  Orthopedic  No limitations of motion  feet .  No crepitus or effusions noted.  No bony pathology or digital deformities noted.  Skin  normotropic skin with no porokeratosis noted bilaterally.  No signs of infections or ulcers noted.    Onychomycosis  Pain in right toes  Pain in left foot   Consent was obtained for treatment procedures.   Mechanical debridement of nails 1-5  bilaterally performed with a nail nipper.  Filed with dremel without incident. No infection or ulcer.  .   Return office visit     3 months                 Told patient to return for periodic foot care and evaluation due to potential at risk complications.   Yaminah Clayborn DPM  

## 2022-09-27 ENCOUNTER — Telehealth: Payer: Self-pay | Admitting: Family Medicine

## 2022-09-27 NOTE — Telephone Encounter (Signed)
Patient requests new RX with dosage increase to 50 MG for  spironolactone (ALDACTONE) 25 MG tablet  Be sent to  [id number] - [place], [place] - [address] [place] [address] AT [place] [address] [place] [address] Phone: [phone]  Fax: [phone]     States he has been doubling 25 MG taking 2 tablets per day

## 2022-09-30 ENCOUNTER — Other Ambulatory Visit: Payer: Self-pay

## 2022-09-30 MED ORDER — SPIRONOLACTONE 50 MG PO TABS: 50.0000 mg | ORAL_TABLET | Freq: Every day | ORAL | 1 refills | Status: DC

## 2022-09-30 NOTE — Telephone Encounter (Signed)
Aldactone 50 mg sent to pharmacy

## 2022-09-30 NOTE — Telephone Encounter (Signed)
Pt has called back about the message sent last week. Please advise.

## 2022-09-30 NOTE — Telephone Encounter (Signed)
Ok to send in rx for 50mg  daily.  Todd Spears. Jimmey Ralph, MD 09/30/2022 11:27 AM

## 2022-10-09 ENCOUNTER — Other Ambulatory Visit: Payer: Self-pay | Admitting: Family Medicine

## 2022-10-10 DIAGNOSIS — H401121 Primary open-angle glaucoma, left eye, mild stage: Secondary | ICD-10-CM | POA: Diagnosis not present

## 2022-10-23 ENCOUNTER — Other Ambulatory Visit: Payer: Self-pay | Admitting: Family Medicine

## 2022-10-31 ENCOUNTER — Institutional Professional Consult (permissible substitution) (HOSPITAL_BASED_OUTPATIENT_CLINIC_OR_DEPARTMENT_OTHER): Payer: Medicare HMO | Admitting: Cardiovascular Disease

## 2022-11-30 ENCOUNTER — Other Ambulatory Visit: Payer: Self-pay | Admitting: Family Medicine

## 2022-12-02 ENCOUNTER — Other Ambulatory Visit: Payer: Self-pay | Admitting: Family Medicine

## 2022-12-02 DIAGNOSIS — J302 Other seasonal allergic rhinitis: Secondary | ICD-10-CM

## 2022-12-14 ENCOUNTER — Other Ambulatory Visit: Payer: Self-pay | Admitting: Family Medicine

## 2022-12-17 ENCOUNTER — Encounter: Payer: Self-pay | Admitting: Podiatry

## 2022-12-17 ENCOUNTER — Ambulatory Visit: Payer: Medicare HMO | Admitting: Podiatry

## 2022-12-17 DIAGNOSIS — M79674 Pain in right toe(s): Secondary | ICD-10-CM

## 2022-12-17 DIAGNOSIS — Q828 Other specified congenital malformations of skin: Secondary | ICD-10-CM

## 2022-12-17 DIAGNOSIS — M216X9 Other acquired deformities of unspecified foot: Secondary | ICD-10-CM | POA: Diagnosis not present

## 2022-12-17 DIAGNOSIS — B351 Tinea unguium: Secondary | ICD-10-CM

## 2022-12-17 DIAGNOSIS — M79675 Pain in left toe(s): Secondary | ICD-10-CM | POA: Diagnosis not present

## 2022-12-17 NOTE — Progress Notes (Signed)
This patient returns to my office for at risk foot care.  This patient requires this care by a professional since this patient will be at risk due to having chronic kidney disease.  Patient has painful callus on the outside bottom of both feet.  This patient is unable to cut nails himself since the patient cannot reach his nails.These nails are painful walking and wearing shoes. Patient has painful callus on the outside ball both feet.  Patient is unable to self treat.   This patient presents for at risk foot care today.  General Appearance  Alert, conversant and in no acute stress.  Vascular  Dorsalis pedis and posterior tibial  pulses are palpable  bilaterally.  Capillary return is within normal limits  bilaterally. Temperature is within normal limits  bilaterally.  Neurologic  Senn-Weinstein monofilament wire test within normal limits  bilaterally. Muscle power within normal limits bilaterally.  Nails Thick disfigured discolored nails with subungual debris  from hallux to fifth toes bilaterally. No evidence of bacterial infection or drainage bilaterally.  Orthopedic  No limitations of motion  feet .  No crepitus or effusions noted.  No bony pathology or digital deformities noted.  Skin  normotropic skin with no porokeratosis noted bilaterally.  No signs of infections or ulcers noted.    Onychomycosis  Pain in right toes  Pain in left foot   Consent was obtained for treatment procedures.   Mechanical debridement of nails 1-5  bilaterally performed with a nail nipper.  Filed with dremel without incident. No infection or ulcer.  .   Return office visit     3 months                 Told patient to return for periodic foot care and evaluation due to potential at risk complications.   Gregory Mayer DPM  

## 2022-12-23 ENCOUNTER — Ambulatory Visit: Payer: Medicare HMO | Admitting: Family Medicine

## 2023-01-14 DIAGNOSIS — H401131 Primary open-angle glaucoma, bilateral, mild stage: Secondary | ICD-10-CM | POA: Diagnosis not present

## 2023-01-28 ENCOUNTER — Ambulatory Visit (INDEPENDENT_AMBULATORY_CARE_PROVIDER_SITE_OTHER): Payer: Medicare HMO | Admitting: Family Medicine

## 2023-01-28 ENCOUNTER — Encounter: Payer: Self-pay | Admitting: Family Medicine

## 2023-01-28 VITALS — BP 159/58 | HR 71 | Temp 97.8°F | Ht 67.0 in | Wt 249.6 lb

## 2023-01-28 DIAGNOSIS — G4762 Sleep related leg cramps: Secondary | ICD-10-CM

## 2023-01-28 DIAGNOSIS — M1990 Unspecified osteoarthritis, unspecified site: Secondary | ICD-10-CM | POA: Diagnosis not present

## 2023-01-28 DIAGNOSIS — N182 Chronic kidney disease, stage 2 (mild): Secondary | ICD-10-CM | POA: Diagnosis not present

## 2023-01-28 DIAGNOSIS — I10 Essential (primary) hypertension: Secondary | ICD-10-CM | POA: Diagnosis not present

## 2023-01-28 MED ORDER — ALBUTEROL SULFATE HFA 108 (90 BASE) MCG/ACT IN AERS: 2.0000 | INHALATION_SPRAY | Freq: Four times a day (QID) | RESPIRATORY_TRACT | 3 refills | Status: DC | PRN

## 2023-01-28 MED ORDER — TRAMADOL HCL 50 MG PO TABS: 50.0000 mg | ORAL_TABLET | Freq: Two times a day (BID) | ORAL | 5 refills | Status: DC

## 2023-01-28 NOTE — Assessment & Plan Note (Signed)
Blood pressure not at goal today.  We have referred him to advanced hypertension clinic however he has not yet been able to be seen over there.  He is currently on spironolactone 50 mg daily, metoprolol succinate 50 mg daily, hydralazine 100 mg 3 times daily, amlodipine 10 mg daily

## 2023-01-28 NOTE — Assessment & Plan Note (Signed)
Check labs 

## 2023-01-28 NOTE — Progress Notes (Signed)
   Todd Spears is a 77 y.o. male who presents today for an office visit.  Assessment/Plan:  New/Acute Problems: Leg Cramps No red flags.  We discussed conservative strategies including maintaining good hydration and stretching before bed.  Will check labs today including CBC, c-Met, magnesium, and TSH.  If labs are normal and symptoms persist would consider trial of gabapentin or vitamin D supplementation.  We discussed reasons to return to care and follow-up.   Chronic Problems Addressed Today: Essential hypertension Blood pressure not at goal today.  We have referred him to advanced hypertension clinic however he has not yet been able to be seen over there.  He is currently on spironolactone 50 mg daily, metoprolol succinate 50 mg daily, hydralazine 100 mg 3 times daily, amlodipine 10 mg daily  CKD (chronic kidney disease) stage 2, GFR 60-89 ml/min Check labs.  Osteoarthritis Overall symptoms are stable.  Uses tramadol infrequently as needed.  Will refill today.  Last refill was about 5 or 6 months ago.     Subjective:  HPI:  See A/P for status of chronic conditions.  Patient is here with cramping.   He is getting this a few times per week. Cramps happen suddenly and occur randomly. It can happen at night or during the day. Cramps will wake up him up in the middle of night. No recent illness or injuries. No obvious precipitating events.  He is getting cramp in hands, feet, thigh, and calves. Described as an intense pain.  Sometimes will have to use warm towels to help alleviate it.  Pain can be very severe.  No recent changes to medications.       Objective:  Physical Exam: BP (!) 159/58   Pulse 71   Temp 97.8 F (36.6 C) (Temporal)   Ht 5\' 7"  (1.702 m)   Wt 249 lb 9.6 oz (113.2 kg)   SpO2 98%   BMI 39.09 kg/m   Gen: No acute distress, resting comfortably CV: Regular rate and rhythm with no murmurs appreciated Pulm: Normal work of breathing, clear to auscultation  bilaterally with no crackles, wheezes, or rhonchi MUSCULOSKELETAL: No deformities.  Full range of motion throughout. Neuro: Grossly normal, moves all extremities Psych: Normal affect and thought content      Todd Spears M. Jimmey Ralph, MD 01/28/2023 3:06 PM

## 2023-01-28 NOTE — Patient Instructions (Addendum)
It was very nice to see you today!  We will check blood work today.  Please try stretching your muscles to make sure that you are getting plenty of fluids.  We will contact you with results once they come back.  Return if symptoms worsen or fail to improve.   Take care, Dr Jimmey Ralph  PLEASE NOTE:  If you had any lab tests, please let us know if you have not heard back within a few days. You may see your results on mychart before we have a chance to review them but we will give you a call once they are reviewed by [place].   If we ordered any referrals today, please let us know if you have not heard from their office within the next week.   If you had any urgent prescriptions sent in today, please check with the pharmacy within an hour of our visit to make sure the prescription was transmitted appropriately.   Please try these tips to maintain a healthy lifestyle:  Eat at least 3 REAL meals and 1-2 snacks per day.  Aim for no more than 5 hours between eating.  If you eat breakfast, please do so within one hour of getting up.   Each meal should contain half fruits/vegetables, one quarter protein, and one quarter carbs (no bigger than a computer mouse)  Cut down on sweet beverages. This includes juice, soda, and sweet tea.   Drink at least 1 glass of water with each meal and aim for at least 8 glasses per day  Exercise at least 150 minutes every week.

## 2023-01-28 NOTE — Assessment & Plan Note (Signed)
Overall symptoms are stable.  Uses tramadol infrequently as needed.  Will refill today.  Last refill was about 5 or 6 months ago.

## 2023-01-29 LAB — COMPREHENSIVE METABOLIC PANEL
ALT: 39 U/L (ref 0–53)
AST: 36 U/L (ref 0–37)
Albumin: 3.9 g/dL (ref 3.5–5.2)
Alkaline Phosphatase: 41 U/L (ref 39–117)
BUN: 22 mg/dL (ref 6–23)
CO2: 24 meq/L (ref 19–32)
Calcium: 9.2 mg/dL (ref 8.4–10.5)
Chloride: 94 meq/L — ABNORMAL LOW (ref 96–112)
Creatinine, Ser: 1.17 mg/dL (ref 0.40–1.50)
GFR: 60.07 mL/min (ref >60.00)
Glucose, Bld: 107 mg/dL — ABNORMAL HIGH (ref 70–99)
Potassium: 4.9 meq/L (ref 3.5–5.1)
Sodium: 127 meq/L — ABNORMAL LOW (ref 135–145)
Total Bilirubin: 0.4 mg/dL (ref 0.2–1.2)
Total Protein: 7 g/dL (ref 6.0–8.3)

## 2023-01-29 LAB — CBC
HCT: 33.2 % — ABNORMAL LOW (ref 39.0–52.0)
Hemoglobin: 11.2 g/dL — ABNORMAL LOW (ref 13.0–17.0)
MCHC: 33.9 g/dL (ref 30.0–36.0)
MCV: 94.4 fl (ref 78.0–100.0)
Platelets: 281 10*3/uL (ref 150.0–400.0)
RBC: 3.52 Mil/uL — ABNORMAL LOW (ref 4.22–5.81)
RDW: 13 % (ref 11.5–15.5)
WBC: 7.8 10*3/uL (ref 4.0–10.5)

## 2023-01-29 LAB — MAGNESIUM: Magnesium: 1.8 mg/dL (ref 1.5–2.5)

## 2023-01-29 LAB — TSH: TSH: 1.23 u[IU]/mL (ref 0.35–5.50)

## 2023-01-29 LAB — VITAMIN B12: Vitamin B-12: >1501 pg/mL — ABNORMAL HIGH (ref 211–911)

## 2023-01-30 NOTE — Progress Notes (Signed)
His sodium is very low.  This is probably causing the cramping.  This is probably due to the spironolactone.  Recommend that he stop his spironolactone and come back here next week to recheck labs.  Recommend he come back for in person office visit so that we can recheck blood pressure as well.

## 2023-02-07 ENCOUNTER — Other Ambulatory Visit: Payer: Self-pay | Admitting: *Deleted

## 2023-02-07 DIAGNOSIS — E871 Hypo-osmolality and hyponatremia: Secondary | ICD-10-CM

## 2023-02-10 ENCOUNTER — Other Ambulatory Visit (INDEPENDENT_AMBULATORY_CARE_PROVIDER_SITE_OTHER): Payer: Medicare HMO

## 2023-02-10 DIAGNOSIS — E871 Hypo-osmolality and hyponatremia: Secondary | ICD-10-CM | POA: Diagnosis not present

## 2023-02-11 LAB — COMPREHENSIVE METABOLIC PANEL
ALT: 39 U/L (ref 0–53)
AST: 32 U/L (ref 0–37)
Albumin: 3.8 g/dL (ref 3.5–5.2)
Alkaline Phosphatase: 39 U/L (ref 39–117)
BUN: 22 mg/dL (ref 6–23)
CO2: 26 meq/L (ref 19–32)
Calcium: 9.4 mg/dL (ref 8.4–10.5)
Chloride: 99 meq/L (ref 96–112)
Creatinine, Ser: 1.42 mg/dL (ref 0.40–1.50)
GFR: 47.6 mL/min — ABNORMAL LOW (ref >60.00)
Glucose, Bld: 150 mg/dL — ABNORMAL HIGH (ref 70–99)
Potassium: 5.2 meq/L — ABNORMAL HIGH (ref 3.5–5.1)
Sodium: 132 meq/L — ABNORMAL LOW (ref 135–145)
Total Bilirubin: 0.4 mg/dL (ref 0.2–1.2)
Total Protein: 6.9 g/dL (ref 6.0–8.3)

## 2023-02-11 NOTE — Progress Notes (Signed)
His sodium is improving though not yet quite back to normal.  I would like for him to follow-up with [place] soon per my last result note to follow-up on his blood pressure and cramping.    He should stay off the spironolactone for now though he should be monitoring his blood pressure at home and let us know if he is having any significant elevations.

## 2023-02-27 ENCOUNTER — Other Ambulatory Visit: Payer: Self-pay | Admitting: Family Medicine

## 2023-03-12 ENCOUNTER — Ambulatory Visit (HOSPITAL_COMMUNITY)
Admission: RE | Admit: 2023-03-12 | Discharge: 2023-03-12 | Disposition: A | Discharge status: Home / Self Care | Payer: Medicare HMO | Source: Ambulatory Visit | Attending: Cardiovascular Disease | Admitting: Cardiovascular Disease

## 2023-03-12 DIAGNOSIS — I779 Disorder of arteries and arterioles, unspecified: Secondary | ICD-10-CM | POA: Insufficient documentation

## 2023-03-12 DIAGNOSIS — I6523 Occlusion and stenosis of bilateral carotid arteries: Secondary | ICD-10-CM | POA: Insufficient documentation

## 2023-03-13 ENCOUNTER — Other Ambulatory Visit (HOSPITAL_COMMUNITY): Payer: Self-pay

## 2023-03-13 ENCOUNTER — Ambulatory Visit (INDEPENDENT_AMBULATORY_CARE_PROVIDER_SITE_OTHER): Payer: Medicare HMO | Admitting: Family Medicine

## 2023-03-13 VITALS — BP 148/72 | HR 68 | Temp 97.8°F | Ht 67.0 in | Wt 252.2 lb

## 2023-03-13 DIAGNOSIS — I10 Essential (primary) hypertension: Secondary | ICD-10-CM | POA: Diagnosis not present

## 2023-03-13 DIAGNOSIS — Z23 Encounter for immunization: Secondary | ICD-10-CM

## 2023-03-13 DIAGNOSIS — I779 Disorder of arteries and arterioles, unspecified: Secondary | ICD-10-CM

## 2023-03-13 LAB — BASIC METABOLIC PANEL
BUN: 23 mg/dL (ref 6–23)
CO2: 28 meq/L (ref 19–32)
Calcium: 9.3 mg/dL (ref 8.4–10.5)
Chloride: 98 meq/L (ref 96–112)
Creatinine, Ser: 0.98 mg/dL (ref 0.40–1.50)
GFR: 74.25 mL/min (ref >60.00)
Glucose, Bld: 111 mg/dL — ABNORMAL HIGH (ref 70–99)
Potassium: 4.2 meq/L (ref 3.5–5.1)
Sodium: 134 meq/L — ABNORMAL LOW (ref 135–145)

## 2023-03-13 NOTE — Assessment & Plan Note (Signed)
Blood pressure at goal per JNC 8.  Better controlled at home.  He has had significant hyponatremic issues with spironolactone and ACE/ARB.  We did discuss increasing dose of metoprolol however he will continue his current regimen for now.  Continue metoprolol succinate 50 mg daily, hydralazine 100 mg 3 times daily, and amlodipine 10 mg daily.  He will continue to monitor at home and let us know if he is having any persistent elevations.  If elevated, would consider increasing dose of metoprolol versus adding on Imdur or clonidine.

## 2023-03-13 NOTE — Patient Instructions (Addendum)
It was very nice to see you today!  It is ok for you to stay off of the spironolactone.  Please continue to keep an eye on your blood pressure and let us know if you have any persistently elevated readings.  We will check labs today.   We will give your flu shot today.  Will flush out your ears.  Return if symptoms worsen or fail to improve.   Take care, Dr Jimmey Ralph  PLEASE NOTE:  If you had any lab tests, please let us know if you have not heard back within a few days. You may see your results on mychart before we have a chance to review them but we will give you a call once they are reviewed by [place].   If we ordered any referrals today, please let us know if you have not heard from their office within the next week.   If you had any urgent prescriptions sent in today, please check with the pharmacy within an hour of our visit to make sure the prescription was transmitted appropriately.   Please try these tips to maintain a healthy lifestyle:  Eat at least 3 REAL meals and 1-2 snacks per day.  Aim for no more than 5 hours between eating.  If you eat breakfast, please do so within one hour of getting up.   Each meal should contain half fruits/vegetables, one quarter protein, and one quarter carbs (no bigger than a computer mouse)  Cut down on sweet beverages. This includes juice, soda, and sweet tea.   Drink at least 1 glass of water with each meal and aim for at least 8 glasses per day  Exercise at least 150 minutes every week.

## 2023-03-13 NOTE — Progress Notes (Signed)
   Todd Spears is a 77 y.o. male who presents today for an office visit.  Assessment/Plan:  New/Acute Problems: Hyponatemia / Leg Cramps Leg cramps have resolved since stopping the spironolactone.  Will recheck bmet today to reassess sodium and other electrolytes.  Cerumen Impaction  Irrigated by RMA today.  Tolerated well.  Can use hydrogen peroxide or Debrox as needed to prevent buildup.  Chronic Problems Addressed Today: Essential hypertension Blood pressure at goal per JNC 8.  Better controlled at home.  He has had significant hyponatremic issues with spironolactone and ACE/ARB.  We did discuss increasing dose of metoprolol however he will continue his current regimen for now.  Continue metoprolol succinate 50 mg daily, hydralazine 100 mg 3 times daily, and amlodipine 10 mg daily.  He will continue to monitor at home and let us know if he is having any persistent elevations.  If elevated, would consider increasing dose of metoprolol versus adding on Imdur or clonidine.  Preventative Healthcare - flu shot given today    Subjective:  HPI:  See A/P for status of chronic conditions.  Patient is here today for follow-up.  We last saw him about 5 weeks ago.  At his last office visit he was having some issues with leg cramps.  We checked labs which showed low sodium.  We recommended he stop his spironolactone.  He has done this. Leg cramps have improved.  Blood pressure readings at home with been in the 130s over 70s to 140s over 70s.  Occasional readings in the 150s over 80s.  Blood pressure usually well-controlled at home.  He also has had decreased hearing in his right ear and would like for [place] to flush it out today.       Objective:  Physical Exam: BP (!) 148/72 Comment: at home at today  Pulse 68   Temp 97.8 F (36.6 C) (Temporal)   Ht 5\' 7"  (1.702 m)   Wt 252 lb 3.2 oz (114.4 kg)   SpO2 99%   BMI 39.50 kg/m   Gen: No acute distress, resting comfortably HEENT: Cerumen  impaction in right EAC. Left tympanic membrane clear.  CV: Regular rate and rhythm with no murmurs appreciated Pulm: Normal work of breathing, clear to auscultation bilaterally with no crackles, wheezes, or rhonchi Neuro: Grossly normal, moves all extremities Psych: Normal affect and thought content      Todd Spears M. Jimmey Ralph, MD 03/13/2023 2:21 PM

## 2023-03-14 NOTE — Progress Notes (Signed)
Sodium is improving.  Do not need to make any changes to his treatment plan at this time.  He can continue to monitor his blood pressure at home and let us know if he has any persistent elevations as we discussed at his office visit.

## 2023-03-16 ENCOUNTER — Other Ambulatory Visit: Payer: Self-pay | Admitting: Family Medicine

## 2023-03-18 NOTE — Progress Notes (Signed)
Todd Spears Date of Birth: Apr 24, 1946 Medical Record #[id number]  History of Present Illness: Todd Spears is seen back today for follow up CAD. He is s/p 2 vessel PCI with Promus DES for 90% mid LAD and 95% mid RCA disease on 04/06/2012. His EF is normal at 55%. At that time he really had no significant anginal symptoms but had an abnormal stress test.  Myoview in Nov. 2014 showed a fixed inferior defect without ischemia. Last Myoview in Feb. 2017 was unchanged. Other issues include CKD stage 3, HTN, obesity, gout,,OA, ED, carotid bruit and asthma.   He was admitted in January 2017 with tremors and hyponatremia. Chlorthalidone was discontinued.   He had carotid dopplers showing 1-39% RICA and 60-79% LICA stenoses. Echo done in March 2017 showed normal LV function and mild AS unchanged from 2013. He has been followed in our lipid clinic for hypercholesterolemia with prior intolerance to statins. He is now on pravastatin and Zetia.   On follow up today he is doing well. He denies any chest pain or SOB. No TIA or CVA symptoms. He is still working as a  Solicitor. BP has been doing OK. He was taken off lisinopril by PCP and started on aldactone but this resulted in a drop in his sodium level so this was discontinued. Still struggles with weight.   Allergies as of 03/24/2023       Reactions   Prednisone Shortness Of Breath, Swelling   Crestor [rosuvastatin Calcium] Other (See Comments)   myalgia   Gabapentin Other (See Comments)   Lyrica [pregabalin]    "swelling"        Medication List        Accurate as of March 18, 2023  9:06 AM. If you have any questions, ask your nurse or doctor.          albuterol 108 (90 Base) MCG/ACT inhaler Commonly known as: VENTOLIN HFA Inhale 2 puffs into the lungs every 6 (six) hours as needed for wheezing or shortness of breath.   amLODipine 10 MG tablet Commonly known as: NORVASC TAKE 1 TABLET BY MOUTH EVERY DAY   aspirin 81 MG chewable  tablet Chew 81 mg by mouth daily.   Azelastine HCl 137 MCG/SPRAY Soln INSTILL 2 SPRAYS INTO BOTH NOSTRILS 2 TIMES DAILY AS DIRECTED   cetirizine 10 MG tablet Commonly known as: ZYRTEC Take 10 mg by mouth every morning.   diclofenac sodium 1 % Gel Commonly known as: VOLTAREN Apply 2 g topically 4 (four) times daily.   esomeprazole 40 MG capsule Commonly known as: NEXIUM TAKE 1 CAPSULE BY MOUTH DAILY BEFORE MEALS   ezetimibe 10 MG tablet Commonly known as: ZETIA TAKE 1 TABLET BY MOUTH EVERY DAY   hydrALAZINE 50 MG tablet Commonly known as: APRESOLINE TAKE 2 TABLETS BY MOUTH 3 TIMES A DAY   Integra Plus Caps TAKE ONE CAPSULE BY MOUTH DAILY   loratadine 10 MG tablet Commonly known as: CLARITIN Take 10 mg by mouth at bedtime.   Lumigan 0.01 % Soln Generic drug: bimatoprost Place 1 drop into the left eye at bedtime.   metoprolol succinate 50 MG 24 hr tablet Commonly known as: TOPROL-XL TAKE 1 TABLET EVERY DAY WITH OR IMMEDIATELY FOLLOWING A MEAL   montelukast 10 MG tablet Commonly known as: SINGULAIR TAKE 1 TABLET BY MOUTH EVERYDAY AT BEDTIME   multivitamin with minerals Tabs tablet Take 1 tablet by mouth daily.   nitroGLYCERIN 0.4 MG SL tablet Commonly known as: Futures trader  1 tablet (0.4 mg total) under the tongue every 5 (five) minutes as needed for chest pain (up to 3 doses. not within 24hrs of Viagra).   pravastatin 10 MG tablet Commonly known as: PRAVACHOL TAKE 1 TABLET EVERY OTHER DAY, ALTERNATING WITH 2 TABLETS.   traMADol 50 MG tablet Commonly known as: ULTRAM Take 1 tablet (50 mg total) by mouth every 12 (twelve) hours.   VITAMIN B 12 PO Take 1 tablet by mouth daily.         Allergies  Allergen Reactions   Prednisone Shortness Of Breath and Swelling   Crestor [Rosuvastatin Calcium] Other (See Comments)    myalgia   Gabapentin Other (See Comments)   Lyrica [Pregabalin]     "swelling"    Past Medical History:  Diagnosis Date    Allergic rhinitis    Anemia    Asthma    CAD (coronary artery disease)    a. s/p PTCA/stenting of the mid LAD and mid RCA 04/06/12   Carotid bruit    r   Colon polyp    Diverticulosis    ED (erectile dysfunction)    Fatty liver    GERD (gastroesophageal reflux disease)    Gout    Hyperlipidemia    Hypertension    Obesity    Osteoarthritis    Renal insufficiency    Restless leg syndrome     Past Surgical History:  Procedure Laterality Date   BACK SURGERY  2020   COLONOSCOPY  04/23/2016   Dr.Perry   INGUINAL HERNIA REPAIR Right 02/27/2016   Procedure: HERNIA REPAIR INGUINAL ADULT;  Surgeon: Emelia Loron, MD;  Location: [facility];  Service: General;  Laterality: Right;   INSERTION OF MESH Right 02/27/2016   Procedure: INSERTION OF MESH--IN AND OUT CATHETERIZATION PERFORMED AT END OF PROCEDURE TO DRAIN URINE.;  Surgeon: Emelia Loron, MD;  Location: [facility];  Service: General;  Laterality: Right;   LYMPH NODE BIOPSY     right side of neck 1999 - benign   PERCUTANEOUS CORONARY STENT INTERVENTION (PCI-S) N/A 04/06/2012   Procedure: PERCUTANEOUS CORONARY STENT INTERVENTION (PCI-S);  Surgeon: Rosamond Andress M [place], MD; Promus DES for 90% mid LAD and 95% mid RCA disease     UPPER GASTROINTESTINAL ENDOSCOPY  04/23/2016   Dr.Perry    Social History   Tobacco Use  Smoking Status Former   Current packs/day: 0.00   Types: Cigarettes   Quit date: 06/04/1975   Years since quitting: 47.8  Smokeless Tobacco Never    Social History   Substance and Sexual Activity  Alcohol Use No   Alcohol/week: 0.0 standard drinks of alcohol    Family History  Problem Relation Age of Onset   Hypertension Mother    Stroke Mother        Several    Stomach cancer Mother    Glaucoma Mother    Coronary artery disease Father        in his 38s   Heart disease Father    Heart attack Father    Hypertension Paternal Grandmother    Hypertension Paternal  Grandfather    Colon cancer Neg Hx    Rectal cancer Neg Hx     Review of Systems: The review of systems is per the HPI.  All other systems were reviewed and are negative.  Physical Exam: There were no vitals taken for this visit. GENERAL:  Well appearing, obese WM  HEENT:  PERRL, EOMI, sclera are clear. Oropharynx is  clear. NECK:  No jugular venous distention, carotid upstroke brisk and symmetric, no bruits, no thyromegaly or adenopathy LUNGS:  Clear to auscultation bilaterally CHEST:  Unremarkable HEART:  RRR,  PMI not displaced or sustained,S1 and S2 within normal limits, no S3, no S4: no clicks, no rubs, harsh gr 2/6 systolic murmur RUSB radiating to carotids.  ABD:  Soft, nontender. BS +, no masses or bruits. No hepatomegaly, no splenomegaly EXT:  2 + pulses throughout, 2+ RLE edema, 1+ on left, no cyanosis no clubbing SKIN:  Warm and dry.  No rashes NEURO:  Alert and oriented x 3. Cranial nerves II through XII intact. PSYCH:  Cognitively intact      LABORATORY DATA: Lab Results  Component Value Date   WBC 7.8 01/28/2023   HGB 11.2 (L) 01/28/2023   HCT 33.2 (L) 01/28/2023   PLT 281.0 01/28/2023   GLUCOSE 111 (H) 03/13/2023   CHOL 145 06/27/2022   TRIG 263.0 (H) 06/27/2022   HDL 43.90 06/27/2022   LDLDIRECT 71.0 06/27/2022   LDLCALC 72 04/17/2021   ALT 39 02/10/2023   AST 32 02/10/2023   NA 134 (L) 03/13/2023   K 4.2 03/13/2023   CL 98 03/13/2023   CREATININE 0.98 03/13/2023   BUN 23 03/13/2023   CO2 28 03/13/2023   TSH 1.23 01/28/2023   PSA 0.64 06/27/2022   INR 0.9 08/07/2021   HGBA1C 6.0 06/27/2022      EKG Interpretation Date/Time:  Monday March 24 2023 09:00:41 EDT Ventricular Rate:  66 PR Interval:  206 QRS Duration:  94 QT Interval:  382 QTC Calculation: 400 R Axis:   17  Text Interpretation: Normal sinus rhythm  Nonspecific T wave abnormality When compared with ECG of 07-Aug-2021 10:37,  Nonspecific T wave abnormality is new.  Confirmed  by [place], Jamia Hoban [phone]) on 03/24/2023 9:04:45 AM      Steffanie Dunn 07/28/15: Study Highlights     There was no ST segment deviation noted during stress. The left ventricular ejection fraction is normal (55-65%). Nuclear stress EF: 56%. Defect 1: There is a medium defect of moderate severity present in the basal inferoseptal, basal inferior, mid inferior and apical inferior location. In setting of normal LVF, this is consistent with diaphragmatic attenuation artifact. No ischemia noted. This is a low risk study.      Addendum by Quintella Reichert, MD on Fri Jul 28, 2015  2:03 PM    There was no ST segment deviation noted during stress. The left ventricular ejection fraction is normal (55-65%). Nuclear stress EF: 56%. Defect 1: There is a medium defect of moderate severity present in the basal inferoseptal, basal inferior, mid inferior and apical inferior location. In setting of normal LVF, this is consistent with diaphragmatic attenuation artifact. No ischemia noted. This is a low risk study.     Echo: 08/25/15: Study Conclusions   - Left ventricle: The cavity size was normal. Wall thickness was   normal. Systolic function was normal. The estimated ejection   fraction was in the range of 60% to 65%. Wall motion was normal;   there were no regional wall motion abnormalities. - Aortic valve: Moderately calcified annulus. Moderately calcified   leaflets. There was mild stenosis. - Mitral valve: There was mild regurgitation. - Left atrium: The atrium was mildly dilated   Assessment / Plan:  1. CAD - status post 2 vessel PCI with DES in November 2013- doing well.  Myoview study in February 2017 was low risk with an  area of inferior scar and no ischemia. Unchanged from 2014.he remains asymptomatic.  We will continue medical therapy and risk factor modification.   2. HTN - blood pressure is under reasonable control today.  He is on multiple meds. Will monitor on current therapy.  Intolerant of aldactone due to hyponatremia. Encourage weight loss and increased aerobic activity.   3. Hyponatremia improved.   4. CKD stage 3a.   5. Hyperlipidemia. Intolerant of statins at higher doses. Continue with  dietary modification and weight loss. Continue pravastatin. Excellent response to addition of Zetia. Last LDL 71.   6. Carotid arterial disease with moderate 40-59% LICA stenosis, 40-59% RICA stenosis. Asymptomatic. Dopplers  this month unchanged.   7. History of mild Aortic stenosis. Last Echo 2017. Will update Echo now.

## 2023-03-19 ENCOUNTER — Other Ambulatory Visit: Payer: Self-pay | Admitting: Family Medicine

## 2023-03-24 ENCOUNTER — Ambulatory Visit: Payer: Medicare HMO | Attending: Cardiology | Admitting: Cardiology

## 2023-03-24 ENCOUNTER — Ambulatory Visit: Payer: Medicare HMO | Admitting: Podiatry

## 2023-03-24 ENCOUNTER — Encounter: Payer: Self-pay | Admitting: Cardiology

## 2023-03-24 ENCOUNTER — Encounter: Payer: Self-pay | Admitting: Podiatry

## 2023-03-24 VITALS — BP 140/66 | HR 66 | Ht 67.0 in | Wt 254.0 lb

## 2023-03-24 DIAGNOSIS — I2510 Atherosclerotic heart disease of native coronary artery without angina pectoris: Secondary | ICD-10-CM | POA: Diagnosis not present

## 2023-03-24 DIAGNOSIS — Q828 Other specified congenital malformations of skin: Secondary | ICD-10-CM | POA: Diagnosis not present

## 2023-03-24 DIAGNOSIS — E7800 Pure hypercholesterolemia, unspecified: Secondary | ICD-10-CM

## 2023-03-24 DIAGNOSIS — M216X9 Other acquired deformities of unspecified foot: Secondary | ICD-10-CM

## 2023-03-24 DIAGNOSIS — I350 Nonrheumatic aortic (valve) stenosis: Secondary | ICD-10-CM

## 2023-03-24 DIAGNOSIS — M79674 Pain in right toe(s): Secondary | ICD-10-CM | POA: Diagnosis not present

## 2023-03-24 DIAGNOSIS — I779 Disorder of arteries and arterioles, unspecified: Secondary | ICD-10-CM | POA: Diagnosis not present

## 2023-03-24 DIAGNOSIS — B351 Tinea unguium: Secondary | ICD-10-CM | POA: Diagnosis not present

## 2023-03-24 DIAGNOSIS — M79675 Pain in left toe(s): Secondary | ICD-10-CM | POA: Diagnosis not present

## 2023-03-24 DIAGNOSIS — I10 Essential (primary) hypertension: Secondary | ICD-10-CM

## 2023-03-24 NOTE — Progress Notes (Signed)
This patient returns to my office for at risk foot care.  This patient requires this care by a professional since this patient will be at risk due to having chronic kidney disease.  Patient has painful callus on the outside bottom of both feet.  This patient is unable to cut nails himself since the patient cannot reach his nails.These nails are painful walking and wearing shoes. Patient has painful callus on the outside ball both feet.  Patient is unable to self treat.   This patient presents for at risk foot care today.  General Appearance  Alert, conversant and in no acute stress.  Vascular  Dorsalis pedis and posterior tibial  pulses are palpable  bilaterally.  Capillary return is within normal limits  bilaterally. Temperature is within normal limits  bilaterally.  Neurologic  Senn-Weinstein monofilament wire test within normal limits  bilaterally. Muscle power within normal limits bilaterally.  Nails Thick disfigured discolored nails with subungual debris  from hallux to fifth toes bilaterally. No evidence of bacterial infection or drainage bilaterally.  Orthopedic  No limitations of motion  feet .  No crepitus or effusions noted.  No bony pathology or digital deformities noted.  Skin  normotropic skin with no porokeratosis noted bilaterally.  No signs of infections or ulcers noted.    Onychomycosis  Pain in right toes  Pain in left foot   Consent was obtained for treatment procedures.   Mechanical debridement of nails 1-5  bilaterally performed with a nail nipper.  Filed with dremel without incident. No infection or ulcer.  .   Return office visit     3 months                 Told patient to return for periodic foot care and evaluation due to potential at risk complications.   Dalena Plantz DPM  

## 2023-03-24 NOTE — Patient Instructions (Addendum)
Medication Instructions:  Continue all current medication *If you need a refill on your cardiac medications before your next appointment, please call your pharmacy*   Lab Work: None  Testing/Procedures:  Schedule ECHO first AVAILABLE  Schedule Carotid  doppler in 1  year  Oct. 2025  Follow-Up: At [facility], you and your health needs are our priority.  As part of our continuing mission to provide you with exceptional heart care, we have created designated Provider Care Teams.  These Care Teams include your primary Cardiologist (physician) and Advanced Practice Providers (APPs -  Physician Assistants and Nurse Practitioners) who all work together to provide you with the care you need, when you need it.  We recommend signing up for the patient portal called "MyChart".  Sign up information is provided on this After Visit Summary.  MyChart is used to connect with patients for Virtual Visits (Telemedicine).  Patients are able to view lab/test results, encounter notes, upcoming appointments, etc.  Non-urgent messages can be sent to your provider as well.   To learn more about what you can do with MyChart, go to [email or web address].    Your next appointment:  I year. Please call office in June 2025 to make Oct. APPT    Provider: Dr. [place]

## 2023-04-08 ENCOUNTER — Ambulatory Visit (HOSPITAL_BASED_OUTPATIENT_CLINIC_OR_DEPARTMENT_OTHER): Payer: Medicare HMO

## 2023-04-08 DIAGNOSIS — I10 Essential (primary) hypertension: Secondary | ICD-10-CM | POA: Diagnosis not present

## 2023-04-08 DIAGNOSIS — I779 Disorder of arteries and arterioles, unspecified: Secondary | ICD-10-CM

## 2023-04-08 DIAGNOSIS — I2510 Atherosclerotic heart disease of native coronary artery without angina pectoris: Secondary | ICD-10-CM

## 2023-04-08 DIAGNOSIS — I517 Cardiomegaly: Secondary | ICD-10-CM | POA: Diagnosis not present

## 2023-04-08 DIAGNOSIS — E7800 Pure hypercholesterolemia, unspecified: Secondary | ICD-10-CM

## 2023-04-08 DIAGNOSIS — I080 Rheumatic disorders of both mitral and aortic valves: Secondary | ICD-10-CM

## 2023-04-08 DIAGNOSIS — I350 Nonrheumatic aortic (valve) stenosis: Secondary | ICD-10-CM | POA: Diagnosis not present

## 2023-04-08 LAB — ECHOCARDIOGRAM COMPLETE
AR max vel: 0.78 cm2
AV Area VTI: 0.79 cm2
AV Area mean vel: 0.72 cm2
AV Mean grad: 33 mm[Hg]
AV Peak grad: 56 mm[Hg]
Ao pk vel: 3.74 m/s
Area-P 1/2: 4.31 cm2
S' Lateral: 3.38 cm

## 2023-04-16 DIAGNOSIS — H40011 Open angle with borderline findings, low risk, right eye: Secondary | ICD-10-CM | POA: Diagnosis not present

## 2023-04-21 ENCOUNTER — Other Ambulatory Visit: Payer: Self-pay

## 2023-04-21 DIAGNOSIS — I350 Nonrheumatic aortic (valve) stenosis: Secondary | ICD-10-CM

## 2023-05-18 ENCOUNTER — Other Ambulatory Visit: Payer: Self-pay | Admitting: Cardiology

## 2023-05-21 ENCOUNTER — Encounter: Payer: Self-pay | Admitting: Family Medicine

## 2023-05-21 ENCOUNTER — Ambulatory Visit: Payer: Medicare HMO | Admitting: Family Medicine

## 2023-05-21 VITALS — BP 145/70 | HR 72 | Temp 98.4°F | Ht 67.0 in | Wt 254.4 lb

## 2023-05-21 DIAGNOSIS — J309 Allergic rhinitis, unspecified: Secondary | ICD-10-CM | POA: Diagnosis not present

## 2023-05-21 DIAGNOSIS — B9689 Other specified bacterial agents as the cause of diseases classified elsewhere: Secondary | ICD-10-CM | POA: Diagnosis not present

## 2023-05-21 DIAGNOSIS — J208 Acute bronchitis due to other specified organisms: Secondary | ICD-10-CM

## 2023-05-21 DIAGNOSIS — I10 Essential (primary) hypertension: Secondary | ICD-10-CM

## 2023-05-21 MED ORDER — AZITHROMYCIN 250 MG PO TABS: ORAL_TABLET | ORAL | 0 refills | Status: DC

## 2023-05-21 NOTE — Progress Notes (Signed)
Subjective  CC:  Chief Complaint  Patient presents with   Sinus Problem    Last Tuesday started with a cough and his rt ear is stopped up and hurting and has been short winded along with some ocassional nose bleeds     HPI: Todd Spears is a 77 y.o. male who presents to the office today to address the problems listed above in the chief complaint. Discussed the use of AI scribe software for clinical note transcription with the patient, who gave verbal consent to proceed.  History of Present Illness   The patient, with a history of hypertension and chronic allergies, presents with a week-long history of upper respiratory symptoms. He initially noticed a postnasal drip, which has since progressed to include coughing, mucus production, and ear discomfort. The patient describes the ear discomfort as a sensation of fullness and decreased hearing in the right ear, with associated pain under the ear and on the side of the head. The left ear has been slightly affected, but not to the same extent. He denies any associated headaches or fever. Despite feeling unwell, he has continued to work daily. The patient has also noticed wheezing when lying down at night, but denies any chest tightness or shortness of breath. He has been self-treating with coricidinbp, but feels that his symptoms have not significantly improved and is requesting an antibiotic.  No cp. Home bp has been stable avg 140/70s. No sinus or dental pain. + thick productive cough.      Assessment  1. Acute bacterial bronchitis   2. Allergic rhinitis, unspecified seasonality, unspecified trigger   3. Essential hypertension      Plan  Assessment and Plan    Upper Respiratory Infection Symptoms began last Tuesday with throat drainage, progressing to severe sinus and ear infection symptoms, including coughing, mucus production, and ear pain. No fever or headaches reported. Lungs clear, no pneumonia or wheezing detected. Possible viral  etiology, but considering bacterial infection due to duration and severity of symptoms. Discussed potential benefits of antibiotics and the possibility of viral etiology, which may not respond to antibiotics. Advised that Mucinex DM can help thin mucus and relieve cough without affecting blood pressure. Patient prefers to start antibiotic immediately. - Prescribe oral antibiotic (Z-Pak, doxycycline, or Augmentin) - Recommend Mucinex DM for mucus thinning and cough relief - Advise to start antibiotic if symptoms do not improve in a day or two  Hypertension Blood pressure recorded as high during the visit. Home readings of 145/70 and 150/72 reported. No acute symptoms related to hypertension noted. Discussed the importance of regular monitoring and maintaining current management. - Monitor blood pressure regularly at home - Continue current management and follow-up as needed  Follow-up - Send antibiotic prescription to [facility] in [place] - Provide after-visit summary via MyChart app.        No orders of the defined types were placed in this encounter.  Meds ordered this encounter  Medications   azithromycin (ZITHROMAX) 250 MG tablet    Sig: Take 2 tabs today, then 1 tab daily for 4 days    Dispense:  1 each    Refill:  0     I reviewed the patients updated PMH, FH, and SocHx.    Patient Active Problem List   Diagnosis Date Noted   Hyperglycemia 04/13/2020   Pain due to onychomycosis of toenails of both feet 11/18/2018   Porokeratosis 11/18/2018   Gout 06/18/2018   Osteoarthritis 06/18/2018   Carotid arterial  disease (HCC) 08/10/2015   Aortic stenosis, mild 08/10/2015   CKD (chronic kidney disease) stage 2, GFR 60-89 ml/min 07/03/2015   CAD s/p PCI 2013    Hyperlipidemia 11/27/2006   Essential hypertension 11/27/2006   Allergic rhinitis 11/27/2006   GERD 11/27/2006   Current Meds  Medication Sig   albuterol (VENTOLIN HFA) 108 (90 Base) MCG/ACT inhaler Inhale 2 puffs into  the lungs every 6 (six) hours as needed for wheezing or shortness of breath.   amLODipine (NORVASC) 10 MG tablet TAKE 1 TABLET BY MOUTH EVERY DAY   aspirin 81 MG chewable tablet Chew 81 mg by mouth daily.   Azelastine HCl 137 MCG/SPRAY SOLN INSTILL 2 SPRAYS INTO BOTH NOSTRILS 2 TIMES DAILY AS DIRECTED   azithromycin (ZITHROMAX) 250 MG tablet Take 2 tabs today, then 1 tab daily for 4 days   cetirizine (ZYRTEC) 10 MG tablet Take 10 mg by mouth every morning.   Cyanocobalamin (VITAMIN B 12 PO) Take 1 tablet by mouth daily.    diclofenac sodium (VOLTAREN) 1 % GEL Apply 2 g topically 4 (four) times daily.   esomeprazole (NEXIUM) 40 MG capsule TAKE 1 CAPSULE BY MOUTH DAILY BEFORE MEALS   ezetimibe (ZETIA) 10 MG tablet TAKE 1 TABLET BY MOUTH EVERY DAY   FeFum-FePoly-FA-B Cmp-C-Biot (INTEGRA PLUS) CAPS TAKE ONE CAPSULE BY MOUTH DAILY   hydrALAZINE (APRESOLINE) 50 MG tablet TAKE 2 TABLETS BY MOUTH 3 TIMES A DAY   loratadine (CLARITIN) 10 MG tablet Take 10 mg by mouth at bedtime.    LUMIGAN 0.01 % SOLN Place 1 drop into the left eye at bedtime.   metoprolol succinate (TOPROL-XL) 50 MG 24 hr tablet TAKE 1 TABLET EVERY DAY WITH OR IMMEDIATELY FOLLOWING A MEAL   montelukast (SINGULAIR) 10 MG tablet TAKE 1 TABLET BY MOUTH EVERYDAY AT BEDTIME   Multiple Vitamin (MULTIVITAMIN WITH MINERALS) TABS tablet Take 1 tablet by mouth daily.   nitroGLYCERIN (NITROSTAT) 0.4 MG SL tablet Place 1 tablet (0.4 mg total) under the tongue every 5 (five) minutes as needed for chest pain (up to 3 doses. not within 24hrs of Viagra).   pravastatin (PRAVACHOL) 10 MG tablet TAKE 1 TABLET EVERY OTHER DAY, ALTERNATING WITH 2 TABLETS.   traMADol (ULTRAM) 50 MG tablet Take 1 tablet (50 mg total) by mouth every 12 (twelve) hours.    Allergies: Patient is allergic to prednisone, crestor [rosuvastatin calcium], gabapentin, and lyrica [pregabalin]. Family History: Patient family history includes Coronary artery disease in his father;  Glaucoma in his mother; Heart attack in his father; Heart disease in his father; Hypertension in his mother, paternal grandfather, and paternal grandmother; Stomach cancer in his mother; Stroke in his mother. Social History:  Patient  reports that he quit smoking about 47 years ago. His smoking use included cigarettes. He has never used smokeless tobacco. He reports that he does not drink alcohol and does not use drugs.  Review of Systems: Constitutional: Negative for fever malaise or anorexia Cardiovascular: negative for chest pain Respiratory: negative for SOB or persistent cough Gastrointestinal: negative for abdominal pain  Objective  Vitals: BP (!) 145/70 Comment: today at home  Pulse 72   Temp 98.4 F (36.9 C)   Ht 5\' 7"  (1.702 m)   Wt 254 lb 6.4 oz (115.4 kg)   SpO2 96%   BMI 39.84 kg/m  General: no acute distress , A&Ox3 HEENT: PEERL, conjunctiva normal, neck is supple Cardiovascular:  RRR without murmur or gallop.  Respiratory:  Good breath sounds bilaterally,  CTAB with normal respiratory effort Skin:  Warm, no rashes  Commons side effects, risks, benefits, and alternatives for medications and treatment plan prescribed today were discussed, and the patient expressed understanding of the given instructions. Patient is instructed to call or message via MyChart if he/she has any questions or concerns regarding our treatment plan. No barriers to understanding were identified. We discussed Red Flag symptoms and signs in detail. Patient expressed understanding regarding what to do in case of urgent or emergency type symptoms.  Medication list was reconciled, printed and provided to the patient in AVS. Patient instructions and summary information was reviewed with the patient as documented in the AVS. This note was prepared with assistance of Dragon voice recognition software. Occasional wrong-word or sound-a-like substitutions may have occurred due to the inherent limitations of voice  recognition software

## 2023-05-21 NOTE — Patient Instructions (Addendum)
Please follow up if symptoms do not improve or as needed.    VISIT SUMMARY:  You visited today due to a week-long history of upper respiratory symptoms, including postnasal drip, coughing, mucus production, and ear discomfort. You also reported wheezing at night but no chest tightness or shortness of breath. Your blood pressure was noted to be high during the visit but stable at home  YOUR PLAN:  -UPPER RESPIRATORY INFECTION: An upper respiratory infection is an infection that affects the nose, throat, and airways. It can be caused by viruses or bacteria. Given the duration and severity of your symptoms, we discussed the potential benefits of antibiotics. You are prescribed an oral antibiotic (Z-Pak) and advised to start it if your symptoms do not improve in a day or two. Additionally, Mucinex DM is recommended to help thin mucus and relieve your cough without affecting your blood pressure.  -HYPERTENSION: Hypertension, or high blood pressure, is a condition where the force of the blood against your artery walls is too high. Your blood pressure was recorded as high during the visit, and you reported similar readings at home. It is important to monitor your blood pressure regularly and continue with your current management plan. Follow up as needed.  INSTRUCTIONS:  Your antibiotic prescription will be sent to [facility] in [place]. Please monitor your symptoms and start the antibiotic if you do not see improvement in a day or two. Continue to monitor your blood pressure regularly at home and follow up as needed. An after-visit summary will be provided via the MyChart app.

## 2023-05-22 ENCOUNTER — Telehealth: Payer: Self-pay | Admitting: *Deleted

## 2023-05-22 NOTE — Telephone Encounter (Signed)
Copied from CRM [id number]. Topic: Clinical - Prescription Issue >> May 21, 2023  4:55 PM Fuller Mandril wrote: Reason for CRM: Pt called and stated he thought the Rx written today was for a liquid but he picked up tablets and wants to make sure it is the correct one.   Please advise

## 2023-05-22 NOTE — Telephone Encounter (Signed)
He saw Dr Mardelle Matte for this. She sent in a zpack.  Katina Degree. Jimmey Ralph, MD 05/22/2023 8:07 AM

## 2023-05-23 ENCOUNTER — Other Ambulatory Visit: Payer: Self-pay | Admitting: Family Medicine

## 2023-05-25 ENCOUNTER — Other Ambulatory Visit: Payer: Self-pay | Admitting: Family Medicine

## 2023-05-25 DIAGNOSIS — J302 Other seasonal allergic rhinitis: Secondary | ICD-10-CM

## 2023-05-26 NOTE — Telephone Encounter (Signed)
Copied from CRM [id number]. Topic: Clinical - Medical Advice >> May 26, 2023 12:19 PM Corin V wrote: Reason for CRM: Patient requesting to speak with Dr. Lavone Neri nurse. He declined to provide any additional information to agent on the matter.   Spoke with patient stated still with sinuses pressure "feeling like he is inside a drum" Finish with Zpack with out changes  Stated need a stronger antibiotic  Please advise

## 2023-06-03 ENCOUNTER — Ambulatory Visit (INDEPENDENT_AMBULATORY_CARE_PROVIDER_SITE_OTHER): Payer: Medicare HMO | Admitting: Family Medicine

## 2023-06-03 ENCOUNTER — Encounter: Payer: Self-pay | Admitting: Family Medicine

## 2023-06-03 VITALS — BP 166/72 | HR 72 | Temp 98.1°F | Ht 67.0 in | Wt 250.0 lb

## 2023-06-03 DIAGNOSIS — H938X1 Other specified disorders of right ear: Secondary | ICD-10-CM | POA: Diagnosis not present

## 2023-06-03 DIAGNOSIS — I10 Essential (primary) hypertension: Secondary | ICD-10-CM | POA: Diagnosis not present

## 2023-06-03 DIAGNOSIS — H9191 Unspecified hearing loss, right ear: Secondary | ICD-10-CM | POA: Diagnosis not present

## 2023-06-03 MED ORDER — METHYLPREDNISOLONE ACETATE 80 MG/ML IJ SUSP
80.0000 mg | Freq: Once | INTRAMUSCULAR | Status: AC
  Administered 2023-06-03: 80 mg via INTRAMUSCULAR

## 2023-06-03 MED ORDER — AMOXICILLIN-POT CLAVULANATE 875-125 MG PO TABS: 1.0000 | ORAL_TABLET | Freq: Two times a day (BID) | ORAL | 0 refills | Status: DC

## 2023-06-03 NOTE — Assessment & Plan Note (Signed)
 Elevated today in setting of acute illness.  He has typically been well-controlled and was well-controlled at his last office visit with me.  We will continue his current regimen with metoprolol  succinate 50 mg daily, hydralazine  100 mg 3 times daily, and amlodipine  10 mg daily.  He will continue to monitor at home and let us  know if persistently elevated.

## 2023-06-03 NOTE — Patient Instructions (Signed)
 It was very nice to see you today!  You have fluid and pressure build up behind your ear.   We will give you and injection of depo medrol  today.  Please start the Augmentin .  I will refer you to ENT.  Return if symptoms worsen or fail to improve.   Take care, Dr Kennyth  PLEASE NOTE:  If you had any lab tests, please let us  know if you have not heard back within a few days. You may see your results on mychart before we have a chance to review them but we will give you a call once they are reviewed by us .   If we ordered any referrals today, please let us  know if you have not heard from their office within the next week.   If you had any urgent prescriptions sent in today, please check with the pharmacy within an hour of our visit to make sure the prescription was transmitted appropriately.   Please try these tips to maintain a healthy lifestyle:  Eat at least 3 REAL meals and 1-2 snacks per day.  Aim for no more than 5 hours between eating.  If you eat breakfast, please do so within one hour of getting up.   Each meal should contain half fruits/vegetables, one quarter protein, and one quarter carbs (no bigger than a computer mouse)  Cut down on sweet beverages. This includes juice, soda, and sweet tea.   Drink at least 1 glass of water with each meal and aim for at least 8 glasses per day  Exercise at least 150 minutes every week.

## 2023-06-03 NOTE — Progress Notes (Signed)
   Todd Spears is a 77 y.o. male who presents today for an office visit.  Assessment/Plan:  New/Acute Problems: Ear Fullness / Decreased Hearing  Exam with obvious middle ear effusion with small amount of sanguinous fluid as well.  Concern for severe eustachian tube dysfunction due to his recent URI.  We discussed treatment options.  He has had bad reactions with prednisone in the past and has done well with Depo-Medrol .  Will give 80 mg of Depo-Medrol  today.  Overall picture is not typical for otitis media however given his severe degree of hearing loss we will empirically treat with course of Augmentin  as well.  Will place urgent referral to ENT due to his degree of hearing loss.  Discussed reasons to return to care and seek emergent care.  Chronic Problems Addressed Today: Essential hypertension Elevated today in setting of acute illness.  He has typically been well-controlled and was well-controlled at his last office visit with me.  We will continue his current regimen with metoprolol  succinate 50 mg daily, hydralazine  100 mg 3 times daily, and amlodipine  10 mg daily.  He will continue to monitor at home and let us  know if persistently elevated.     Subjective:  HPI:  See A/P for status of chronic conditions.  Patient is here for right ear fullness and decreased hearing. This started 21 days ago. Initially had URI symptoms including rhinorrhea, congestion, and sore throat. This improved however the next week started developing a sharp pain on the ride side of his head.  This happened a couple of times however since he has not had any ear pain.  He has had significantly decreased hearing and fullness to his right ear over the last couple of weeks.  No change over that time. HE was seen here a couple of weeks ago by a different provider and was started on a zpack which did not help.  He has not had any drainage.  No fevers or chills.       Objective:  Physical Exam: BP (!) 166/72    Pulse 72   Temp 98.1 F (36.7 C) (Temporal)   Ht 5' 7 (1.702 m)   Wt 250 lb (113.4 kg)   SpO2 98%   BMI 39.16 kg/m   Gen: No acute distress, resting comfortably HEENT: Left TM clear.  Right TM with effusion and small collection of sanguinous appearing fluid along dependent aspect behind right TM. CV: Regular rate and rhythm with no murmurs appreciated Pulm: Normal work of breathing, clear to auscultation bilaterally with no crackles, wheezes, or rhonchi Neuro: Grossly normal, moves all extremities Psych: Normal affect and thought content      Todd Feehan M. Kennyth, MD 06/03/2023 8:12 AM

## 2023-06-16 ENCOUNTER — Ambulatory Visit (INDEPENDENT_AMBULATORY_CARE_PROVIDER_SITE_OTHER): Payer: Medicare HMO | Admitting: Family Medicine

## 2023-06-16 VITALS — BP 163/66 | HR 66 | Temp 98.2°F | Ht 67.0 in | Wt 251.4 lb

## 2023-06-16 DIAGNOSIS — I10 Essential (primary) hypertension: Secondary | ICD-10-CM

## 2023-06-16 DIAGNOSIS — H938X1 Other specified disorders of right ear: Secondary | ICD-10-CM

## 2023-06-16 MED ORDER — METHYLPREDNISOLONE ACETATE 80 MG/ML IJ SUSP
80.0000 mg | Freq: Once | INTRAMUSCULAR | Status: AC
  Administered 2023-06-16: 80 mg via INTRAMUSCULAR

## 2023-06-16 MED ORDER — AMOXICILLIN-POT CLAVULANATE 875-125 MG PO TABS: 1.0000 | ORAL_TABLET | Freq: Two times a day (BID) | ORAL | 0 refills | Status: DC

## 2023-06-16 NOTE — Addendum Note (Signed)
 Addended by: Dyann Kief on: 06/16/2023 11:04 AM   Modules accepted: Orders

## 2023-06-16 NOTE — Assessment & Plan Note (Signed)
 Elevated today.  As been well-controlled at previous office visit.  Usually well-controlled at home as well.  May have element of whitecoat hypertension.  He will continue his current regimen with metoprolol  succinate 50 mg daily, hydralazine  100 mg 3 times daily, and amlodipine  10 mg daily.  He will continue to monitor at home and let us  know if persistently elevated.

## 2023-06-16 NOTE — Progress Notes (Signed)
   Todd Spears is a 78 y.o. male who presents today for an office visit.  Assessment/Plan:  New/Acute Problems: Ear Fullness / Decreased Hearing  Exam with improving middle ear effusion.  Concern for severe eustachian tube dysfunction.  He did have modest improvement with Depo-Medrol  injection 2 weeks ago.  Will repeat injection today with 80 mg Depo-Medrol .  Also repeat course of Augmentin  as he does think that this helped as well.  He will be seeing ENT in a couple of weeks.  Will defer further management to them however he will let us  know if he has any worsening symptoms before he can see them.  Chronic Problems Addressed Today: Essential hypertension Elevated today.  As been well-controlled at previous office visit.  Usually well-controlled at home as well.  May have element of whitecoat hypertension.  He will continue his current regimen with metoprolol  succinate 50 mg daily, hydralazine  100 mg 3 times daily, and amlodipine  10 mg daily.  He will continue to monitor at home and let us  know if persistently elevated.     Subjective:  HPI:  See Assessment / plan for status of chronic conditions. Patient here today for follow up. We saw him 2 weeks ago for ear fullness and decreased hearing. At that time there was concern for severe eustachian tube dysfunction with possible underlying otitis media.  We gave him an injection of Depo-Medrol  as well as course of Augmentin .  He has had modest improvement in symptoms over the last couple of weeks.  He was referred to ENT and will be seeing them in a couple of weeks.  Still having some muffled hearing on the right side but this is improved.  Some occasional pain.        Objective:  Physical Exam: BP (!) 163/66   Pulse 66   Temp 98.2 F (36.8 C) (Temporal)   Ht 5' 7 (1.702 m)   Wt 251 lb 6.4 oz (114 kg)   SpO2 98%   BMI 39.37 kg/m   Gen: No acute distress, resting comfortably HEENT: Left TM clear.  Right TM with effusion. CV: Regular  rate and rhythm with no murmurs appreciated Pulm: Normal work of breathing, clear to auscultation bilaterally with no crackles, wheezes, or rhonchi Neuro: Grossly normal, moves all extremities Psych: Normal affect and thought content      Daltin Crist M. Kennyth, MD 06/16/2023 10:59 AM

## 2023-06-16 NOTE — Patient Instructions (Signed)
 It was very nice to see you today!  I am glad your symptoms are improving.  Will give you another steroid injection and another bout of antibiotics.  Please keep your appointment with ENT.  Return if symptoms worsen or fail to improve.   Take care, Dr Kennyth  PLEASE NOTE:  If you had any lab tests, please let us  know if you have not heard back within a few days. You may see your results on mychart before we have a chance to review them but we will give you a call once they are reviewed by us .   If we ordered any referrals today, please let us  know if you have not heard from their office within the next week.   If you had any urgent prescriptions sent in today, please check with the pharmacy within an hour of our visit to make sure the prescription was transmitted appropriately.   Please try these tips to maintain a healthy lifestyle:  Eat at least 3 REAL meals and 1-2 snacks per day.  Aim for no more than 5 hours between eating.  If you eat breakfast, please do so within one hour of getting up.   Each meal should contain half fruits/vegetables, one quarter protein, and one quarter carbs (no bigger than a computer mouse)  Cut down on sweet beverages. This includes juice, soda, and sweet tea.   Drink at least 1 glass of water with each meal and aim for at least 8 glasses per day  Exercise at least 150 minutes every week.

## 2023-06-17 ENCOUNTER — Other Ambulatory Visit: Payer: Self-pay | Admitting: Family Medicine

## 2023-06-17 DIAGNOSIS — J302 Other seasonal allergic rhinitis: Secondary | ICD-10-CM

## 2023-06-24 ENCOUNTER — Ambulatory Visit: Payer: Medicare HMO | Admitting: Podiatry

## 2023-06-26 ENCOUNTER — Ambulatory Visit (INDEPENDENT_AMBULATORY_CARE_PROVIDER_SITE_OTHER): Payer: Medicare HMO | Admitting: Podiatry

## 2023-06-26 ENCOUNTER — Encounter: Payer: Self-pay | Admitting: Podiatry

## 2023-06-26 DIAGNOSIS — M79675 Pain in left toe(s): Secondary | ICD-10-CM

## 2023-06-26 DIAGNOSIS — Q828 Other specified congenital malformations of skin: Secondary | ICD-10-CM | POA: Diagnosis not present

## 2023-06-26 DIAGNOSIS — M79674 Pain in right toe(s): Secondary | ICD-10-CM | POA: Diagnosis not present

## 2023-06-26 DIAGNOSIS — B351 Tinea unguium: Secondary | ICD-10-CM | POA: Diagnosis not present

## 2023-06-26 DIAGNOSIS — M216X9 Other acquired deformities of unspecified foot: Secondary | ICD-10-CM | POA: Diagnosis not present

## 2023-06-26 NOTE — Progress Notes (Signed)
This patient returns to my office for at risk foot care.  This patient requires this care by a professional since this patient will be at risk due to having chronic kidney disease.  Patient has painful callus on the outside bottom of both feet.  This patient is unable to cut nails himself since the patient cannot reach his nails.These nails are painful walking and wearing shoes. Patient has painful callus on the outside ball both feet.  Patient is unable to self treat.   This patient presents for at risk foot care today.  General Appearance  Alert, conversant and in no acute stress.  Vascular  Dorsalis pedis and posterior tibial  pulses are palpable  bilaterally.  Capillary return is within normal limits  bilaterally. Temperature is within normal limits  bilaterally.  Neurologic  Senn-Weinstein monofilament wire test within normal limits  bilaterally. Muscle power within normal limits bilaterally.  Nails Thick disfigured discolored nails with subungual debris  from hallux to fifth toes bilaterally. No evidence of bacterial infection or drainage bilaterally.  Orthopedic  No limitations of motion  feet .  No crepitus or effusions noted.  No bony pathology or digital deformities noted.  Skin  normotropic skin with no porokeratosis noted bilaterally.  No signs of infections or ulcers noted.    Onychomycosis  Pain in right toes  Pain in left foot   Consent was obtained for treatment procedures.   Mechanical debridement of nails 1-5  bilaterally performed with a nail nipper.  Filed with dremel without incident. No infection or ulcer.  Debride callus with # 15 blade and dremel tool..   Return office visit     3 months                 Told patient to return for periodic foot care and evaluation due to potential at risk complications.   Helane Gunther DPM

## 2023-07-02 ENCOUNTER — Ambulatory Visit: Payer: Medicare HMO

## 2023-07-02 ENCOUNTER — Other Ambulatory Visit: Payer: Self-pay | Admitting: Family Medicine

## 2023-07-03 ENCOUNTER — Telehealth (INDEPENDENT_AMBULATORY_CARE_PROVIDER_SITE_OTHER): Payer: Self-pay | Admitting: Otolaryngology

## 2023-07-03 ENCOUNTER — Telehealth (INDEPENDENT_AMBULATORY_CARE_PROVIDER_SITE_OTHER): Payer: Self-pay | Admitting: Audiology

## 2023-07-03 ENCOUNTER — Ambulatory Visit (INDEPENDENT_AMBULATORY_CARE_PROVIDER_SITE_OTHER): Payer: Medicare HMO

## 2023-07-03 VITALS — BP 145/75 | Ht 67.0 in | Wt 250.0 lb

## 2023-07-03 DIAGNOSIS — Z0000 Encounter for general adult medical examination without abnormal findings: Secondary | ICD-10-CM

## 2023-07-03 NOTE — Progress Notes (Signed)
Because this visit was a virtual/telehealth visit,  certain criteria was not obtained, such a blood pressure, CBG if applicable, and timed get up and go. Any medications not marked as "taking" were not mentioned during the medication reconciliation part of the visit. Any vitals not documented were not able to be obtained due to this being a telehealth visit or patient was unable to self-report a recent blood pressure reading due to a lack of equipment at home via telehealth. Vitals that have been documented are verbally provided by the patient.  Interactive audio and video telecommunications were attempted between this provider and patient, however failed, due to patient having technical difficulties OR patient did not have access to video capability.  We continued and completed visit with audio only.  Subjective:   Todd Spears is a 78 y.o. male who presents for Medicare Annual/Subsequent preventive examination.  Visit Complete: Virtual I connected with  [name] on 07/03/23 by a audio enabled telemedicine application and verified that I am speaking with the correct person using two identifiers.  Patient Location: Home  Provider Location: Home Office  I discussed the limitations of evaluation and management by telemedicine. The patient expressed understanding and agreed to proceed.  Vital Signs: Because this visit was a virtual/telehealth visit, some criteria may be missing or patient reported. Any vitals not documented were not able to be obtained and vitals that have been documented are patient reported.  Patient Medicare AWV questionnaire was completed by the patient on na; I have confirmed that all information answered by patient is correct and no changes since this date.  Cardiac Risk Factors include: advanced age (>31men, >36 women);hypertension;dyslipidemia;male gender;obesity (BMI >30kg/m2)     Objective:    Today's Vitals   07/03/23 1057  BP: (!) 145/75  Weight: 250 lb  (113.4 kg)  Height: 5\' 7"  (1.702 m)   Body mass index is 39.16 kg/m.     07/03/2023   11:01 AM 05/28/2022    2:17 PM 08/07/2021   10:01 AM 06/24/2018    7:58 PM 04/23/2016    7:21 AM 02/21/2016   10:47 AM 02/05/2016   11:36 PM  Advanced Directives  Does Patient Have a Medical Advance Directive? No No No No No No No  Would patient like information on creating a medical advance directive? No - Patient declined No - Patient declined  No - Patient declined   No - patient declined information    Current Medications (verified) Outpatient Encounter Medications as of 07/03/2023  Medication Sig   albuterol (VENTOLIN HFA) 108 (90 Base) MCG/ACT inhaler Inhale 2 puffs into the lungs every 6 (six) hours as needed for wheezing or shortness of breath.   amLODipine (NORVASC) 10 MG tablet TAKE 1 TABLET BY MOUTH EVERY DAY   amoxicillin-clavulanate (AUGMENTIN) 875-125 MG tablet Take 1 tablet by mouth 2 (two) times daily.   aspirin 81 MG chewable tablet Chew 81 mg by mouth daily.   Azelastine HCl 137 MCG/SPRAY SOLN INSTILL 2 SPRAYS INTO BOTH NOSTRILS 2 TIMES DAILY AS DIRECTED   cetirizine (ZYRTEC) 10 MG tablet Take 10 mg by mouth every morning.   Cyanocobalamin (VITAMIN B 12 PO) Take 1 tablet by mouth daily.    diclofenac sodium (VOLTAREN) 1 % GEL Apply 2 g topically 4 (four) times daily.   esomeprazole (NEXIUM) 40 MG capsule TAKE 1 CAPSULE BY MOUTH DAILY BEFORE MEALS   ezetimibe (ZETIA) 10 MG tablet TAKE 1 TABLET BY MOUTH EVERY DAY   FeFum-FePoly-FA-B Cmp-C-Biot (  INTEGRA PLUS) CAPS TAKE ONE CAPSULE BY MOUTH DAILY   hydrALAZINE (APRESOLINE) 50 MG tablet TAKE 2 TABLETS BY MOUTH 3 TIMES A DAY   loratadine (CLARITIN) 10 MG tablet Take 10 mg by mouth at bedtime.    LUMIGAN 0.01 % SOLN Place 1 drop into the left eye at bedtime.   metoprolol succinate (TOPROL-XL) 50 MG 24 hr tablet TAKE 1 TABLET EVERY DAY WITH OR IMMEDIATELY FOLLOWING A MEAL   montelukast (SINGULAIR) 10 MG tablet TAKE 1 TABLET BY MOUTH EVERYDAY  AT BEDTIME   Multiple Vitamin (MULTIVITAMIN WITH MINERALS) TABS tablet Take 1 tablet by mouth daily.   nitroGLYCERIN (NITROSTAT) 0.4 MG SL tablet Place 1 tablet (0.4 mg total) under the tongue every 5 (five) minutes as needed for chest pain (up to 3 doses. not within 24hrs of Viagra).   pravastatin (PRAVACHOL) 10 MG tablet TAKE 1 TABLET EVERY OTHER DAY, ALTERNATING WITH 2 TABLETS.   traMADol (ULTRAM) 50 MG tablet Take 1 tablet (50 mg total) by mouth every 12 (twelve) hours.   No facility-administered encounter medications on file as of 07/03/2023.    Allergies (verified) Prednisone, Crestor [rosuvastatin calcium], Gabapentin, and Lyrica [pregabalin]   History: Past Medical History:  Diagnosis Date   Allergic rhinitis    Anemia    Asthma    CAD (coronary artery disease)    a. s/p PTCA/stenting of the mid LAD and mid RCA 04/06/12   Carotid bruit    r   Colon polyp    Diverticulosis    ED (erectile dysfunction)    Fatty liver    GERD (gastroesophageal reflux disease)    Gout    Hyperlipidemia    Hypertension    Obesity    Osteoarthritis    Renal insufficiency    Restless leg syndrome    Past Surgical History:  Procedure Laterality Date   BACK SURGERY  2020   COLONOSCOPY  04/23/2016   Dr.Perry   INGUINAL HERNIA REPAIR Right 02/27/2016   Procedure: HERNIA REPAIR INGUINAL ADULT;  Surgeon: Emelia Loron, MD;  Location: [facility];  Service: General;  Laterality: Right;   INSERTION OF MESH Right 02/27/2016   Procedure: INSERTION OF MESH--IN AND OUT CATHETERIZATION PERFORMED AT END OF PROCEDURE TO DRAIN URINE.;  Surgeon: Emelia Loron, MD;  Location: [facility];  Service: General;  Laterality: Right;   LYMPH NODE BIOPSY     right side of neck 1999 - benign   PERCUTANEOUS CORONARY STENT INTERVENTION (PCI-S) N/A 04/06/2012   Procedure: PERCUTANEOUS CORONARY STENT INTERVENTION (PCI-S);  Surgeon: Peter M Swaziland, MD; Promus DES for 90% mid  LAD and 95% mid RCA disease     UPPER GASTROINTESTINAL ENDOSCOPY  04/23/2016   Dr.Perry   Family History  Problem Relation Age of Onset   Hypertension Mother    Stroke Mother        Several    Stomach cancer Mother    Glaucoma Mother    Coronary artery disease Father        in his 51s   Heart disease Father    Heart attack Father    Hypertension Paternal Grandmother    Hypertension Paternal Grandfather    Colon cancer Neg Hx    Rectal cancer Neg Hx    Social History   Socioeconomic History   Marital status: Married    Spouse name: Not on file   Number of children: 1   Years of education: Not on file   Highest  education level: Not on file  Occupational History    Employer: [employer]  Tobacco Use   Smoking status: Former    Current packs/day: 0.00    Types: Cigarettes    Quit date: 06/04/1975    Years since quitting: 48.1   Smokeless tobacco: Never  Vaping Use   Vaping status: Never Used  Substance and Sexual Activity   Alcohol use: No    Alcohol/week: 0.0 standard drinks of alcohol   Drug use: No   Sexual activity: Not Currently  Other Topics Concern   Not on file  Social History Narrative   Works as a Solicitor    Married   One son ( lives in [place])    Regular exercise- no   Social Drivers of Corporate investment banker Strain: Low Risk  (07/03/2023)   Overall Financial Resource Strain (CARDIA)    Difficulty of Paying Living Expenses: Not hard at all  Food Insecurity: No Food Insecurity (07/03/2023)   Hunger Vital Sign    Worried About Running Out of Food in the Last Year: Never true    Ran Out of Food in the Last Year: Never true  Transportation Needs: No Transportation Needs (07/03/2023)   PRAPARE - Administrator, Civil Service (Medical): No    Lack of Transportation (Non-Medical): No  Physical Activity: Sufficiently Active (07/03/2023)   Exercise Vital Sign    Days of Exercise per Week: 5 days    Minutes of Exercise  per Session: 30 min  Stress: No Stress Concern Present (07/03/2023)   [employer] of Occupational Health - Occupational Stress Questionnaire    Feeling of Stress : Not at all  Social Connections: Socially Integrated (07/03/2023)   Social Connection and Isolation Panel [NHANES]    Frequency of Communication with Friends and Family: More than three times a week    Frequency of Social Gatherings with Friends and Family: More than three times a week    Attends Religious Services: More than 4 times per year    Active Member of [employer] or Organizations: Yes    Attends Engineer, structural: More than 4 times per year    Marital Status: Married    Tobacco Counseling Counseling given: Yes   Clinical Intake:  Pre-visit preparation completed: Yes  Pain : No/denies pain     BMI - recorded: 39.16 Nutritional Risks: None Diabetes: No  How often do you need to have someone help you when you read instructions, pamphlets, or other written materials from your doctor or pharmacy?: 1 - Never  Interpreter Needed?: No  Information entered by :: Maryjean Ka CMA   Activities of Daily Living    07/03/2023   10:59 AM  In your present state of health, do you have any difficulty performing the following activities:  Hearing? 1  Comment pt states he is having hearing difficulty right now due to a recent viral infection and fluid behind ear drums  Vision? 0  Difficulty concentrating or making decisions? 0  Walking or climbing stairs? 0  Dressing or bathing? 0  Doing errands, shopping? 0  Preparing Food and eating ? N  Using the Toilet? N  In the past six months, have you accidently leaked urine? N  Do you have problems with loss of bowel control? N  Managing your Medications? N  Managing your Finances? N  Housekeeping or managing your Housekeeping? N    Patient Care Team: Ardith Dark, MD as PCP -  General (Family Medicine) Swaz[place], Peter M, MD as PCP - Cardiology  (Cardiology) Alejandro Mulling, RN as [facility] Care Management  Indicate any recent Medical Services you may have received from other than [facility] providers in the past year (date may be approximate).     Assessment:   This is a routine wellness examination for Todd Spears.  Hearing/Vision screen Hearing Screening - Comments:: Patient is having hearing difficulties right now due to a recent viral infection (ears) and fluid behind ear drum. Scheduled to see specialist 07/04/2023 Vision Screening - Comments:: Patient wears reading glasses. Is up to date with exams. Sees Dr. Heather Burundi [facility]    Goals Addressed             This Visit's Progress    Patient Stated       Get some remodeling done in my home       Depression Screen    07/03/2023   11:04 AM 06/16/2023   10:39 AM 06/03/2023    7:20 AM 05/21/2023    2:31 PM 03/13/2023    2:03 PM 01/28/2023    2:30 PM 08/23/2022    7:28 AM  PHQ 2/9 Scores  PHQ - 2 Score 0 0 0 0 0 0 0  PHQ- 9 Score 0          Fall Risk    07/03/2023   11:03 AM 06/16/2023   10:39 AM 06/03/2023    7:20 AM 05/21/2023    2:31 PM 03/13/2023    2:03 PM  Fall Risk   Falls in the past year? 0 0 0 0 0  Number falls in past yr: 0 0 0 0 0  Injury with Fall? 0 0 0 0 0  Risk for fall due to : No Fall Risks No Fall Risks No Fall Risks No Fall Risks No Fall Risks  Follow up Falls prevention discussed   Falls evaluation completed     MEDICARE RISK AT HOME: Medicare Risk at Home Any stairs in or around the home?: Yes If so, are there any without handrails?: No Home free of loose throw rugs in walkways, pet beds, electrical cords, etc?: Yes Adequate lighting in your home to reduce risk of falls?: Yes Life alert?: No Use of a cane, walker or w/c?: No Grab bars in the bathroom?: No Shower chair or bench in shower?: No Elevated toilet seat or a handicapped toilet?: No  TIMED UP AND GO:  Was the test performed?  No    Cognitive Function:         07/03/2023   11:04 AM 05/28/2022    2:18 PM  6CIT Screen  What Year? 0 points 0 points  What month? 0 points 0 points  What time? 0 points 0 points  Count back from 20 0 points 0 points  Months in reverse 0 points 0 points  Repeat phrase 0 points 0 points  Total Score 0 points 0 points    Immunizations Immunization History  Administered Date(s) Administered   Fluad Quad(high Dose 65+) 03/06/2019, 03/16/2020   Fluad Trivalent(High Dose 65+) 03/13/2023   Influenza Split 03/22/2011, 04/07/2012, 03/14/2014   Influenza Whole 06/04/2003, 03/23/2009   Influenza, High Dose Seasonal PF 03/14/2015, 03/21/2016, 03/18/2017, 02/27/2018, 02/20/2021   Influenza,inj,Quad PF,6+ Mos 03/04/2013   Influenza-Unspecified 02/27/2018, 03/16/2022   Moderna Sars-Covid-2 Vaccination 06/29/2019, 07/27/2019, 04/04/2020   Pneumococcal Conjugate-13 06/16/2014   Pneumococcal Polysaccharide-23 06/03/2004, 03/04/2013   Td 03/31/1996, 06/03/2002   Tdap 03/04/2013   Zoster Recombinant(Shingrix)  06/28/2022, 09/07/2022   Zoster, Live 12/17/2010    TDAP status: Due, Education has been provided regarding the importance of this vaccine. Advised may receive this vaccine at local pharmacy or Health Dept. Aware to provide a copy of the vaccination record if obtained from local pharmacy or Health Dept. Verbalized acceptance and understanding.  Flu Vaccine status: Up to date  Pneumococcal vaccine status: Up to date  Covid-19 vaccine status: Information provided on how to obtain vaccines.   Qualifies for Shingles Vaccine? No   Zostavax completed Yes   Shingrix Completed?: Yes  Screening Tests Health Maintenance  Topic Date Due   COVID-19 Vaccine (4 - 2024-25 season) 02/02/2023   DTaP/Tdap/Td (4 - Td or Tdap) 03/05/2023   Medicare Annual Wellness (AWV)  05/29/2023   Pneumonia Vaccine 60+ Years old  Completed   INFLUENZA VACCINE  Completed   Hepatitis C Screening  Completed   Zoster Vaccines- Shingrix   Completed   HPV VACCINES  Aged Out   Colonoscopy  Discontinued    Health Maintenance  Health Maintenance Due  Topic Date Due   COVID-19 Vaccine (4 - 2024-25 season) 02/02/2023   DTaP/Tdap/Td (4 - Td or Tdap) 03/05/2023   Medicare Annual Wellness (AWV)  05/29/2023    Colorectal cancer screening: No longer required.   Lung Cancer Screening: (Low Dose CT Chest recommended if Age 55-80 years, 20 pack-year currently smoking OR have quit w/in 15years.) does not qualify.   Lung Cancer Screening Referral: na  Additional Screening:  Hepatitis C Screening: does not qualify; Completed   Vision Screening: Recommended annual ophthalmology exams for early detection of glaucoma and other disorders of the eye. Is the patient up to date with their annual eye exam?  Yes  Who is the provider or what is the name of the office in which the patient attends annual eye exams? Dr. Heather Burundi If pt is not established with a provider, would they like to be referred to a provider to establish care? No .   Dental Screening: Recommended annual dental exams for proper oral hygiene  Diabetic Foot Exam: na  Community Resource Referral / Chronic Care Management: CRR required this visit?  No   CCM required this visit?  No     Plan:     I have personally reviewed and noted the following in the patient's chart:   Medical and social history Use of alcohol, tobacco or illicit drugs  Current medications and supplements including opioid prescriptions. Patient is not currently taking opioid prescriptions. Functional ability and status Nutritional status Physical activity Advanced directives List of other physicians Hospitalizations, surgeries, and ER visits in previous 12 months Vitals Screenings to include cognitive, depression, and falls Referrals and appointments  In addition, I have reviewed and discussed with patient certain preventive protocols, quality metrics, and best practice  recommendations. A written personalized care plan for preventive services as well as general preventive health recommendations were provided to patient.     Jordan Hawks Rheagan Nayak, CMA   07/03/2023   After Visit Summary: (MyChart) Due to this being a telephonic visit, the after visit summary with patients personalized plan was offered to patient via MyChart   Nurse Notes: na

## 2023-07-03 NOTE — Telephone Encounter (Signed)
Reminder Call: Date: 07/04/2023 Status: Sch  Time: 2:30 PM [address] [place], [place] [address]  Left voicemail w/time and location.

## 2023-07-03 NOTE — Telephone Encounter (Signed)
Reminder Call: Date: 07/04/2023 Status: Sch  Time: 2:00 PM [address] [place], [place] [address]  Left voicemail w/time and location.

## 2023-07-03 NOTE — Patient Instructions (Signed)
[name] , Thank you for taking time to come for your Medicare Wellness Visit. I appreciate your ongoing commitment to your health goals. Please review the following plan we discussed and let me know if I can assist you in the future.   Referrals/Orders/Follow-Ups/Clinician Recommendations:  Next Medicare Annual Wellness Visit:July 05, 2024 at 1:00pm  You are due for the vaccines checked below. You may have these done at your preferred pharmacy. Please have them fax the office proof of the vaccines so that we can update your chart.   []  Flu (due annually)  Recommended this fall either at PCP office or through your local pharmacy. The flu season starts August 1 of each year.   []  Shingrix (Shingles vaccine): CDC recommends 2 doses of Shingrix separated by 2-6 months for aged 78 years and older:  []  Pneumonia Vaccines: Recommended for adults 65 years or older  [x]  TDAP (Tetanus) Vaccine every 10 years:Recommended every 10 years; Please call your insurance company to determine your out of pocket expense. You also receive this vaccine at your local pharmacy or Health Dept.  []  Covid-19: Available now at any [facility] pharmacy (see info below)  You may also get your vaccines at any [facility] (locations listed below.) Vaccine hours are Monday - Friday 9:00 - 4:00. No appointments are required. Most insurances are accepted including Medicaid. Anyone can use the community pharmacies, and people are not required to have a [facility] provider.  Community Pharmacy Locations offering vaccines:   Sport and exercise psychologist   [facility] [place] [facility]  10 vaccines are offered at the [employer]: Covid, flu, Tdap, shingles, RSV, pneumonia, meningococcal, hepatitis A, hepatitis B, and HPV.    This is a list of the screening recommended for you and due dates:  Health Maintenance  Topic Date Due    COVID-19 Vaccine (4 - 2024-25 season) 02/02/2023   DTaP/Tdap/Td vaccine (4 - Td or Tdap) 03/05/2023   Medicare Annual Wellness Visit  07/02/2024   Pneumonia Vaccine  Completed   Flu Shot  Completed   Hepatitis C Screening  Completed   Zoster (Shingles) Vaccine  Completed   HPV Vaccine  Aged Out   Colon Cancer Screening  Discontinued    Advanced directives: (ACP Link)Information on Advanced Care Planning can be found at [facility] Advance Health Care Directives Advance Health Care Directives ([email or web address]   Next Medicare Annual Wellness Visit scheduled for next year: yes  Preventive Care 65 Years and Older, Male Preventive care refers to lifestyle choices and visits with your health care provider that can promote health and wellness. Preventive care visits are also called wellness exams. What can I expect for my preventive care visit? Counseling During your preventive care visit, your health care provider may ask about your: Medical history, including: Past medical problems. Family medical history. History of falls. Current health, including: Emotional well-being. Home life and relationship well-being. Sexual activity. Memory and ability to understand (cognition). Lifestyle, including: Alcohol, nicotine or tobacco, and drug use. Access to firearms. Diet, exercise, and sleep habits. Work and work Astronomer. Sunscreen use. Safety issues such as seatbelt and bike helmet use. Physical exam Your health care provider will check your: Height and weight. These may be used to calculate your BMI (body mass index). BMI is a measurement that tells if you are at a healthy weight. Waist circumference. This measures the distance  around your waistline. This measurement also tells if you are at a healthy weight and may help predict your risk of certain diseases, such as type 2 diabetes and high blood pressure. Heart rate and blood pressure. Body temperature. Skin for  abnormal spots. What immunizations do I need?  Vaccines are usually given at various ages, according to a schedule. Your health care provider will recommend vaccines for you based on your age, medical history, and lifestyle or other factors, such as travel or where you work. What tests do I need? Screening Your health care provider may recommend screening tests for certain conditions. This may include: Lipid and cholesterol levels. Diabetes screening. This is done by checking your blood sugar (glucose) after you have not eaten for a while (fasting). Hepatitis C test. Hepatitis B test. HIV (human immunodeficiency virus) test. STI (sexually transmitted infection) testing, if you are at risk. Lung cancer screening. Colorectal cancer screening. Prostate cancer screening. Abdominal aortic aneurysm (AAA) screening. You may need this if you are a current or former smoker. Talk with your health care provider about your test results, treatment options, and if necessary, the need for more tests. Follow these instructions at home: Eating and drinking  Eat a diet that includes fresh fruits and vegetables, whole grains, lean protein, and low-fat dairy products. Limit your intake of foods with high amounts of sugar, saturated fats, and salt. Take vitamin and mineral supplements as recommended by your health care provider. Do not drink alcohol if your health care provider tells you not to drink. If you drink alcohol: Limit how much you have to 0-2 drinks a day. Know how much alcohol is in your drink. In the [place] one drink equals one 12 oz bottle of beer (355 mL), one 5 oz glass of wine (148 mL), or one 1 oz glass of hard liquor (44 mL). Lifestyle Brush your teeth every morning and night with fluoride toothpaste. Floss one time each day. Exercise for at least 30 minutes 5 or more days each week. Do not use any products that contain nicotine or tobacco. These products include cigarettes, chewing  tobacco, and vaping devices, such as e-cigarettes. If you need help quitting, ask your health care provider. Do not use drugs. If you are sexually active, practice safe sex. Use a condom or other form of protection to prevent STIs. Take aspirin only as told by your health care provider. Make sure that you understand how much to take and what form to take. Work with your health care provider to find out whether it is safe and beneficial for you to take aspirin daily. Ask your health care provider if you need to take a cholesterol-lowering medicine (statin). Find healthy ways to manage stress, such as: Meditation, yoga, or listening to music. Journaling. Talking to a trusted person. Spending time with friends and family. Safety Always wear your seat belt while driving or riding in a vehicle. Do not drive: If you have been drinking alcohol. Do not ride with someone who has been drinking. When you are tired or distracted. While texting. If you have been using any mind-altering substances or drugs. Wear a helmet and other protective equipment during sports activities. If you have firearms in your house, make sure you follow all gun safety procedures. Minimize exposure to UV radiation to reduce your risk of skin cancer. What's next? Visit your health care provider once a year for an annual wellness visit. Ask your health care provider how often you should have your  eyes and teeth checked. Stay up to date on all vaccines. This information is not intended to replace advice given to you by your health care provider. Make sure you discuss any questions you have with your health care provider. Document Revised: 11/15/2020 Document Reviewed: 11/15/2020 Elsevier Patient Education  2024 [employer].  Understanding Your Risk for Falls Millions of people have serious injuries from falls each year. It is important to understand your risk of falling. Talk with your health care provider about your risk  and what you can do to lower it. If you do have a serious fall, make sure to tell your provider. Falling once raises your risk of falling again. How can falls affect me? Serious injuries from falls are common. These include: Broken bones, such as hip fractures. Head injuries, such as traumatic brain injuries (TBI) or concussions. A fear of falling can cause you to avoid activities and stay at home. This can make your muscles weaker and raise your risk for a fall. What can increase my risk? There are a number of risk factors that increase your risk for falling. The more risk factors you have, the higher your risk of falling. Serious injuries from a fall happen most often to people who are older than 78 years old. Teenagers and young adults ages 77-29 are also at higher risk. Common risk factors include: Weakness in the lower body. Being generally weak or confused due to long-term (chronic) illness. Dizziness or balance problems. Poor vision. Medicines that cause dizziness or drowsiness. These may include: Medicines for your blood pressure, heart, anxiety, insomnia, or swelling (edema). Pain medicines. Muscle relaxants. Other risk factors include: Drinking alcohol. Having had a fall in the past. Having foot pain or wearing improper footwear. Working at a dangerous job. Having any of the following in your home: Tripping hazards, such as floor clutter or loose rugs. Poor lighting. Pets. Having dementia or memory loss. What actions can I take to lower my risk of falling?     Physical activity Stay physically fit. Do strength and balance exercises. Consider taking a regular class to build strength and balance. Yoga and tai chi are good options. Vision Have your eyes checked every year and your prescription for glasses or contacts updated as needed. Shoes and walking aids Wear non-skid shoes. Wear shoes that have rubber soles and low heels. Do not wear high heels. Do not walk around  the house in socks or slippers. Use a cane or walker as told by your provider. Home safety Attach secure railings on both sides of your stairs. Install grab bars for your bathtub, shower, and toilet. Use a non-skid mat in your bathtub or shower. Attach bath mats securely with double-sided, non-slip rug tape. Use good lighting in all rooms. Keep a flashlight near your bed. Make sure there is a clear path from your bed to the bathroom. Use night-lights. Do not use throw rugs. Make sure all carpeting is taped or tacked down securely. Remove all clutter from walkways and stairways, including extension cords. Repair uneven or broken steps and floors. Avoid walking on icy or slippery surfaces. Walk on the grass instead of on icy or slick sidewalks. Use ice melter to get rid of ice on walkways in the winter. Use a cordless phone. Questions to ask your health care provider Can you help me check my risk for a fall? Do any of my medicines make me more likely to fall? Should I take a vitamin D supplement? What exercises  can I do to improve my strength and balance? Should I make an appointment to have my vision checked? Do I need a bone density test to check for weak bones (osteoporosis)? Would it help to use a cane or a walker? Where to find more information Centers for Disease Control and Prevention, STEADI: TonerPromos.no Community-Based Fall Prevention Programs: TonerPromos.no [employer] on Aging: BaseRingTones.pl Contact a health care provider if: You fall at home. You are afraid of falling at home. You feel weak, drowsy, or dizzy. This information is not intended to replace advice given to you by your health care provider. Make sure you discuss any questions you have with your health care provider. Document Revised: 01/21/2022 Document Reviewed: 01/21/2022 Elsevier Patient Education  2024 [employer].

## 2023-07-04 ENCOUNTER — Ambulatory Visit (INDEPENDENT_AMBULATORY_CARE_PROVIDER_SITE_OTHER): Payer: Medicare HMO | Admitting: Audiology

## 2023-07-04 ENCOUNTER — Encounter (INDEPENDENT_AMBULATORY_CARE_PROVIDER_SITE_OTHER): Payer: Self-pay

## 2023-07-04 ENCOUNTER — Ambulatory Visit (INDEPENDENT_AMBULATORY_CARE_PROVIDER_SITE_OTHER): Payer: Medicare HMO

## 2023-07-04 VITALS — BP 178/72 | HR 78 | Ht 67.0 in | Wt 250.0 lb

## 2023-07-04 DIAGNOSIS — H6991 Unspecified Eustachian tube disorder, right ear: Secondary | ICD-10-CM | POA: Diagnosis not present

## 2023-07-04 DIAGNOSIS — H6501 Acute serous otitis media, right ear: Secondary | ICD-10-CM

## 2023-07-04 DIAGNOSIS — H90A22 Sensorineural hearing loss, unilateral, left ear, with restricted hearing on the contralateral side: Secondary | ICD-10-CM

## 2023-07-04 DIAGNOSIS — H906 Mixed conductive and sensorineural hearing loss, bilateral: Secondary | ICD-10-CM | POA: Diagnosis not present

## 2023-07-04 DIAGNOSIS — H903 Sensorineural hearing loss, bilateral: Secondary | ICD-10-CM

## 2023-07-04 MED ORDER — FLUTICASONE PROPIONATE 50 MCG/ACT NA SUSP: 2.0000 | Freq: Two times a day (BID) | NASAL | 6 refills | Status: DC

## 2023-07-04 NOTE — Progress Notes (Unsigned)
Dear Dr. Jimmey Ralph, Here is my assessment for our mutual patient, Todd Spears. Thank you for allowing me the opportunity to care for your patient. Please do not hesitate to contact me should you have any other questions. Sincerely, Dr. Jovita Kussmaul  Otolaryngology Clinic Note Referring provider: Dr. Jimmey Ralph HPI:  Todd Spears is a 78 y.o. male kindly referred by Dr. Jimmey Ralph for evaluation of ear fullness.   Patient reports: right ear fullness and pain in December during a URI. Got two courses of augmentin, two shots of steroids, improved URI symptoms and pain. Right ear fullness.  He is able to hear some from left, but lost it suddenly in his 88s (normal hearing before it) -- no reason for it. They reported he had "never damage". Does not know how it happened but was sudden hearing loss. Does not wear hearing aid.  He does astelin as needed. No general sinusitis or nasal trouble. Patient denies: ear pain, vertigo, drainage, tinnitus Patient additionally denies: deep pain in ear canal, eustachian tube symptoms such as popping/crackling, sensitive to pressure changes Patient also denies barotrauma, vestibular suppressant use, ototoxic medication use Prior ear surgery: no   H&N Surgery: *** Personal or FHx of bleeding dz or anesthesia difficulty: no ***  GLP-1: *** AP/AC: ***  Tobacco: ***. Alcohol: ***. Occupation: ***. Lives in *** with ***.  PMHx: HTN, Aortic stenosis, CAD s/p PCI (2013), Gout, OA, prior hyponatremia, KD Independent Review of Additional Tests or Records:  Dr. Jimmey Ralph (FM) 06/16/2023): Noted ear fullness, decreaesd hearing; improving ME effusion, c/f ETD; improved with steroid injection, now given another; Rpt course of augmentin; Dx: ETD, ME effusion, Rx: ENT Dr. Jimmey Ralph 06/03/2023: Noted ME effusion with c/f ETD; recent URI; prior rxn to steroids; has HL; Rx: Augmentin; f/u 2 weeks Dr. Durward Mallard (FM) 05/21/2023: URI sx, throat drainage, sinus and ear sx including pain; Dx:  Bronchitis, URI; Rx: Oral abx (Z pak), mucinex PMH/Meds/All/SocHx/FamHx/ROS:   Past Medical History:  Diagnosis Date   Allergic rhinitis    Anemia    Asthma    CAD (coronary artery disease)    a. s/p PTCA/stenting of the mid LAD and mid RCA 04/06/12   Carotid bruit    r   Colon polyp    Diverticulosis    ED (erectile dysfunction)    Fatty liver    GERD (gastroesophageal reflux disease)    Gout    Hyperlipidemia    Hypertension    Obesity    Osteoarthritis    Renal insufficiency    Restless leg syndrome      Past Surgical History:  Procedure Laterality Date   BACK SURGERY  2020   COLONOSCOPY  04/23/2016   Dr.Perry   INGUINAL HERNIA REPAIR Right 02/27/2016   Procedure: HERNIA REPAIR INGUINAL ADULT;  Surgeon: Emelia Loron, MD;  Location: [facility];  Service: General;  Laterality: Right;   INSERTION OF MESH Right 02/27/2016   Procedure: INSERTION OF MESH--IN AND OUT CATHETERIZATION PERFORMED AT END OF PROCEDURE TO DRAIN URINE.;  Surgeon: Emelia Loron, MD;  Location: [facility];  Service: General;  Laterality: Right;   LYMPH NODE BIOPSY     right side of neck 1999 - benign   PERCUTANEOUS CORONARY STENT INTERVENTION (PCI-S) N/A 04/06/2012   Procedure: PERCUTANEOUS CORONARY STENT INTERVENTION (PCI-S);  Surgeon: Peter M Swaziland, MD; Promus DES for 90% mid LAD and 95% mid RCA disease     UPPER GASTROINTESTINAL ENDOSCOPY  04/23/2016   Dr.Perry  Family History  Problem Relation Age of Onset   Hypertension Mother    Stroke Mother        Several    Stomach cancer Mother    Glaucoma Mother    Coronary artery disease Father        in his 63s   Heart disease Father    Heart attack Father    Hypertension Paternal Grandmother    Hypertension Paternal Grandfather    Colon cancer Neg Hx    Rectal cancer Neg Hx      Social Connections: Socially Integrated (07/03/2023)   Social Connection and Isolation Panel [NHANES]    Frequency  of Communication with Friends and Family: More than three times a week    Frequency of Social Gatherings with Friends and Family: More than three times a week    Attends Religious Services: More than 4 times per year    Active Member of [employer] or Organizations: Yes    Attends Engineer, structural: More than 4 times per year    Marital Status: Married      Current Outpatient Medications:    albuterol (VENTOLIN HFA) 108 (90 Base) MCG/ACT inhaler, Inhale 2 puffs into the lungs every 6 (six) hours as needed for wheezing or shortness of breath., Disp: 8.5 each, Rfl: 3   amLODipine (NORVASC) 10 MG tablet, TAKE 1 TABLET BY MOUTH EVERY DAY, Disp: 90 tablet, Rfl: 2   amoxicillin-clavulanate (AUGMENTIN) 875-125 MG tablet, Take 1 tablet by mouth 2 (two) times daily., Disp: 20 tablet, Rfl: 0   aspirin 81 MG chewable tablet, Chew 81 mg by mouth daily., Disp: , Rfl:    Azelastine HCl 137 MCG/SPRAY SOLN, INSTILL 2 SPRAYS INTO BOTH NOSTRILS 2 TIMES DAILY AS DIRECTED, Disp: 90 mL, Rfl: 1   cetirizine (ZYRTEC) 10 MG tablet, Take 10 mg by mouth every morning., Disp: , Rfl:    Cyanocobalamin (VITAMIN B 12 PO), Take 1 tablet by mouth daily. , Disp: , Rfl:    diclofenac sodium (VOLTAREN) 1 % GEL, Apply 2 g topically 4 (four) times daily., Disp: 100 g, Rfl: 3   esomeprazole (NEXIUM) 40 MG capsule, TAKE 1 CAPSULE BY MOUTH DAILY BEFORE MEALS, Disp: 90 capsule, Rfl: 2   ezetimibe (ZETIA) 10 MG tablet, TAKE 1 TABLET BY MOUTH EVERY DAY, Disp: 90 tablet, Rfl: 3   FeFum-FePoly-FA-B Cmp-C-Biot (INTEGRA PLUS) CAPS, TAKE ONE CAPSULE BY MOUTH DAILY, Disp: 90 capsule, Rfl: 3   hydrALAZINE (APRESOLINE) 50 MG tablet, TAKE 2 TABLETS BY MOUTH 3 TIMES A DAY, Disp: 540 tablet, Rfl: 3   loratadine (CLARITIN) 10 MG tablet, Take 10 mg by mouth at bedtime. , Disp: , Rfl:    LUMIGAN 0.01 % SOLN, Place 1 drop into the left eye at bedtime., Disp: , Rfl:    metoprolol succinate (TOPROL-XL) 50 MG 24 hr tablet, TAKE 1 TABLET EVERY DAY  WITH OR IMMEDIATELY FOLLOWING A MEAL, Disp: 90 tablet, Rfl: 2   montelukast (SINGULAIR) 10 MG tablet, TAKE 1 TABLET BY MOUTH EVERYDAY AT BEDTIME, Disp: 90 tablet, Rfl: 3   Multiple Vitamin (MULTIVITAMIN WITH MINERALS) TABS tablet, Take 1 tablet by mouth daily., Disp: , Rfl:    nitroGLYCERIN (NITROSTAT) 0.4 MG SL tablet, Place 1 tablet (0.4 mg total) under the tongue every 5 (five) minutes as needed for chest pain (up to 3 doses. not within 24hrs of Viagra)., Disp: 25 tablet, Rfl: 6   pravastatin (PRAVACHOL) 10 MG tablet, TAKE 1 TABLET EVERY OTHER DAY, ALTERNATING WITH  2 TABLETS., Disp: 135 tablet, Rfl: 1   traMADol (ULTRAM) 50 MG tablet, Take 1 tablet (50 mg total) by mouth every 12 (twelve) hours., Disp: 60 tablet, Rfl: 5   Physical Exam:   There were no vitals taken for this visit.  Salient findings:  CN II-XII intact *** Bilateral EAC clear and TM intact with well pneumatized middle ear spaces Weber 512: *** Rinne 512: AC > BC b/l *** Rine 1024: AC > BC b/l *** Anterior rhinoscopy: Septum ***; bilateral inferior turbinates with *** No lesions of oral cavity/oropharynx; dentition *** No obviously palpable neck masses/lymphadenopathy/thyromegaly No respiratory distress or stridor***  Right ear serous fluid;  Seprately Identifiable Procedures:  None***  Impression & Plans:  Todd Spears is a 78 y.o. male with ***  No diagnosis found.   F/u 6 weeks; if fluid remianing, will scope and do tube  - f/u ***  See below regarding exact medications prescribed this encounter including dosages and route: No orders of the defined types were placed in this encounter.     Thank you for allowing me the opportunity to care for your patient. Please do not hesitate to contact me should you have any other questions.  Sincerely, Jovita Kussmaul, MD Otolaryngologist (ENT), [facility] Phone: [phone] Fax: [phone]  07/04/2023, 1:16 PM   MDM:  Level  *** Complexity/Problems addressed: *** Data complexity: *** independent review of *** - Morbidity: ***  - Prescription Drug prescribed or managed: ***

## 2023-07-04 NOTE — Patient Instructions (Signed)
Use flonase two sprays each nostril twice daily Try to pop your ears multiple times per day

## 2023-07-04 NOTE — Progress Notes (Signed)
  [address] [place], [place] [address] [phone]  Audiological Evaluation    Name: Todd Spears     DOB:   1946-04-17      MRN:   [id number]                                                                                     Service Date: 07/04/2023     Accompanied by: unaccompanied   Patient comes today after Dr. Allena Katz, ENT sent a referral for a hearing evaluation due to concerns with right  hearing loss.   Symptoms Yes Details  Hearing loss  [x]  Reports longstanding left ear hearing loss and was told many years ago that only a CROS hearing aid would be able to help him. Today he presents with concerns of right ear hearing loss with onset December 2024 after URI that was treated by his physician. The pain th right ear went away, but still has difficulty hearing.  Tinnitus  []    Ear pain/ Ear infections  []    Balance problems  []    Noise exposure  []    Previous ear surgeries  []    Family history  []    Amplification  []    Other  []      Otoscopy: Right ear: Clear external ear canals and notable landmarks visualized on the tympanic membrane. Left ear:  Clear external ear canals and notable landmarks visualized on the tympanic membrane.  Tympanometry: Right ear: Type B- Normal external ear canal volume with no middle ear pressure and tympanic membrane compliance Left ear: Type A- Normal external ear canal volume with normal middle ear pressure and tympanic membrane compliance  Pure tone Audiometry: Right ear- Mild to severe mixed hearing loss from 125 Hz - 8000 Hz. Left ear-  Mild to profound mixed hearing loss from 125 Hz - 8000 Hz. Significant difference in between ears, essentially worse across frequencies for the left ear.   The hearing test results were completed under headphones and results are deemed to be of good reliability. Masking dilemma at some frequencies. Test technique:  conventional     Speech Audiometry: Right ear- Speech Reception  Threshold (SRT) was obtained at 50 dBHL Left ear-Speech Reception Threshold (SRT) was obtained at 90 dBHL with contralateral masking   Word Recognition Score Tested using NU-6 (MLV) Right ear: 84% was obtained at a presentation level of 95 dBHL with contralateral masking which is deemed as  good  Left ear:  Could not be tested due to degree of hearing loss.     Recommendations: Follow up with ENT as scheduled for today. Repeat audiogram after medical care.   Roni Friberg MARIE LEROUX-MARTINEZ, AUD

## 2023-07-11 ENCOUNTER — Other Ambulatory Visit: Payer: Self-pay | Admitting: Family Medicine

## 2023-07-16 ENCOUNTER — Telehealth (INDEPENDENT_AMBULATORY_CARE_PROVIDER_SITE_OTHER): Payer: Self-pay | Admitting: Otolaryngology

## 2023-07-16 NOTE — Telephone Encounter (Signed)
Patient called in and was wondering if he could be seen before his 6 wk follow-up appt in March.  (You had scheduled him to see Audio same day he is to see you.)  States that he still cannot hear any better out of that ear.  Please advise.  [phone]

## 2023-07-29 ENCOUNTER — Telehealth (INDEPENDENT_AMBULATORY_CARE_PROVIDER_SITE_OTHER): Payer: Self-pay | Admitting: Otolaryngology

## 2023-07-29 NOTE — Telephone Encounter (Signed)
 Called and confirmed appt date, time and address w/ patient

## 2023-07-29 NOTE — Telephone Encounter (Signed)
 Confirmed address and both appts with patient for 07/30/2023.

## 2023-07-30 ENCOUNTER — Ambulatory Visit (INDEPENDENT_AMBULATORY_CARE_PROVIDER_SITE_OTHER): Payer: Medicare HMO | Admitting: Audiology

## 2023-07-30 ENCOUNTER — Ambulatory Visit (INDEPENDENT_AMBULATORY_CARE_PROVIDER_SITE_OTHER): Payer: Medicare HMO | Admitting: Otolaryngology

## 2023-07-30 VITALS — BP 214/76 | HR 69

## 2023-07-30 DIAGNOSIS — H903 Sensorineural hearing loss, bilateral: Secondary | ICD-10-CM

## 2023-07-30 DIAGNOSIS — H6521 Chronic serous otitis media, right ear: Secondary | ICD-10-CM

## 2023-07-30 DIAGNOSIS — H9011 Conductive hearing loss, unilateral, right ear, with unrestricted hearing on the contralateral side: Secondary | ICD-10-CM

## 2023-07-30 DIAGNOSIS — H6991 Unspecified Eustachian tube disorder, right ear: Secondary | ICD-10-CM

## 2023-07-30 DIAGNOSIS — Z0110 Encounter for examination of ears and hearing without abnormal findings: Secondary | ICD-10-CM

## 2023-07-30 DIAGNOSIS — H90A22 Sensorineural hearing loss, unilateral, left ear, with restricted hearing on the contralateral side: Secondary | ICD-10-CM

## 2023-07-30 MED ORDER — OFLOXACIN 0.3 % OT SOLN: 4.0000 [drp] | Freq: Two times a day (BID) | OTIC | 0 refills | Status: AC

## 2023-07-30 MED ORDER — FLUTICASONE PROPIONATE 50 MCG/ACT NA SUSP: 2.0000 | Freq: Two times a day (BID) | NASAL | 6 refills | Status: AC

## 2023-07-30 NOTE — Patient Instructions (Signed)
 Use ofloxacin ear drops - 4 dropstwice daily for 5 days

## 2023-07-30 NOTE — Progress Notes (Signed)
  [address], [address] [place], [place] [address] [phone]  Audiological Evaluation    Name: Todd Spears     DOB:   1946-01-04      MRN:   [id number]                                                                                     Service Date: 07/30/2023     Accompanied by: unaccompanied    Patient comes today after Dr. Allena Katz, ENT sent a referral for a hearing evaluation due to concerns with hearing loss.   Symptoms Yes Details  Hearing loss  []  07-04-2023: Right ear- Mild to severe mixed hearing loss from 125 Hz - 8000 Hz. Left ear-  Mild to profound mixed hearing loss from 125 Hz - 8000 Hz. Significant difference in between ears, essentially worse across frequencies for the left ear.  07-11-20: normal hearing sloping to a moderately-severe sensorineural hearing loss in the right ear and a moderate sloping to profound sensorineural hearing loss in the left ear.   Tinnitus  []    Ear pain/ infections/pressure  [x]  Right ear- no improvement  Balance problems  []    Noise exposure history  []    Previous ear surgeries  []    Family history of hearing loss  []    Amplification  []    Other  []  Todd Spears was seen for an audiological evaluation. Todd Spears reports a history of left sudden sensorineural hearing loss occurring 15+ years ago and reports very limited hearing from his left ear. He denies tinnitus, otalgia, aural fullness, and dizziness. He denies hearing concerns regarding his right ear. Todd Spears reports a CROS aid was previously recommended to him for his hearing loss however he was not interested in pursuing hearing aids.       Tympanometry: Right ear: Type B- Normal external ear canal volume with no middle ear pressure peak or tympanic membrane compliance Left ear: Type A- Normal external ear canal volume with normal middle ear pressure and tympanic membrane compliance   Today's hearing test was deferred due to patient not perceiving an improvement and middle ear dysfunction  in the right ear continues to be observed during tympanometry.  The patient and Dr. Allena Katz were in agreement.    Recommendations: Follow up with ENT as scheduled for today., Repeat audiogram after medical care.   Kaytlin Burklow MARIE LEROUX-MARTINEZ, AUD

## 2023-07-30 NOTE — Progress Notes (Signed)
 Dear Dr. Jimmey Ralph, Here is my assessment for our mutual patient, Todd Spears. Thank you for allowing me the opportunity to care for your patient. Please do not hesitate to contact me should you have any other questions. Sincerely, Dr. Jovita Kussmaul  Otolaryngology Clinic Note Referring provider: Dr. Jimmey Ralph HPI:  Todd Spears is a 78 y.o. male kindly referred by Dr. Jimmey Ralph for evaluation of ear fullness.   Initial visit (06/2023): Patient reports: right ear fullness and pain in December during a URI. Got two courses of augmentin, two shots of steroids, improved URI symptoms and pain. Now persistent right ear fullness.  He is able to hear some from left (very little), but lost hearing in that ear suddenly in his 33s (normal hearing before it) -- no antecedent event. He saw an ENT and audiology, and reported he had "never damage". Does not know how it happened but does note it was sudden hearing loss. Does not wear hearing aid. He reports he cannot takes steroids as he "swells up" No general sinusitis or nasal trouble. Patient currently denies: ear pain, vertigo, drainage, tinnitus Patient additionally denies: deep pain in ear canal, eustachian tube symptoms such as popping/crackling, sensitive to pressure changes Patient also denies barotrauma, vestibular suppressant use, ototoxic medication use Prior ear surgery: no  No autoimmune symptoms. No recent hearing tests.  --------------------------------------------------------- 07/30/2023 Seen in follow up today. Unfortunately, he still continues to have right sided ear fullness, popping, and trouble hearing. He has been using flonase and insufflating his ears, but no significant benefit. We discussed options again and he wishes to have a tube placed.  --------------------------------------------------------------- H&N Surgery: denies Personal or FHx of bleeding dz or anesthesia difficulty: no   GLP-1: no AP/AC: ASA 81  Tobacco: former (only  smoked ~1 year). Lives in [place], [place]  PMHx: HTN, Aortic stenosis, CAD s/p PCI (2013), Gout, OA, prior hyponatremia, KD Independent Review of Additional Tests or Records:  Dr. Jimmey Ralph (FM) 06/16/2023): Noted ear fullness, decreaesd hearing; improving ME effusion, c/f ETD; improved with steroid injection, now given another; Rpt course of augmentin; Dx: ETD, ME effusion, Rx: ENT Dr. Jimmey Ralph 06/03/2023: Noted ME effusion with c/f ETD; recent URI; prior rxn to steroids; has HL; Rx: Augmentin; f/u 2 weeks Dr. Durward Mallard (FM) 05/21/2023: URI sx, throat drainage, sinus and ear sx including pain; Dx: Bronchitis, URI; Rx: Oral abx (Z pak), mucinex 07/2023 Audio/Tymps interpreted independently: B AD; A AS 06/2023 Audiogram was independently reviewed and interpreted by me and it reveals AD: B tymp; AS: A tymp; Left - mild then profound mixed(?) HL -- essentially SNHL; CNT discrim score; Right: mild to severe mixed HL, but noted mixed dilemma; clear asymmetry; AD: 84% at 95dB WRT   SNHL= Sensorineural hearing loss  Audio [facility]NT (Dr. Shelva Majestic) 12/2007 and 11/03/2007 independently reviewed:quite similar compared to 2025 on left; on right, low freq declined but could be due to acute effusion   BMP 03/13/2023, CBC 01/28/2023 reviewed: WBC 7.8, Plt 281, Hgb 11.2, BMP essentially wnl, Na borderline  MRI Brain 08/07/2021 independently reviewed with respect to ears: no obvious retrocochlear; mild paranasal disease disease b/l - right max; no NP lesions noted  [facility] 08/07/2021 independently reviewed: no significant mastoid or ME effusion; no otic capsule abnormalities; cuts thick  PMH/Meds/All/SocHx/FamHx/ROS:   Past Medical History:  Diagnosis Date   Allergic rhinitis    Anemia    Asthma    CAD (coronary artery disease)    a. s/p PTCA/stenting of the mid LAD and mid  RCA 04/06/12   Carotid bruit    r   Colon polyp    Diverticulosis    ED (erectile dysfunction)    Fatty liver    GERD (gastroesophageal reflux  disease)    Gout    Hyperlipidemia    Hypertension    Obesity    Osteoarthritis    Renal insufficiency    Restless leg syndrome      Past Surgical History:  Procedure Laterality Date   BACK SURGERY  2020   COLONOSCOPY  04/23/2016   Dr.Perry   INGUINAL HERNIA REPAIR Right 02/27/2016   Procedure: HERNIA REPAIR INGUINAL ADULT;  Surgeon: Emelia Loron, MD;  Location: [facility];  Service: General;  Laterality: Right;   INSERTION OF MESH Right 02/27/2016   Procedure: INSERTION OF MESH--IN AND OUT CATHETERIZATION PERFORMED AT END OF PROCEDURE TO DRAIN URINE.;  Surgeon: Emelia Loron, MD;  Location: [facility];  Service: General;  Laterality: Right;   LYMPH NODE BIOPSY     right side of neck 1999 - benign   PERCUTANEOUS CORONARY STENT INTERVENTION (PCI-S) N/A 04/06/2012   Procedure: PERCUTANEOUS CORONARY STENT INTERVENTION (PCI-S);  Surgeon: Peter M Swaziland, MD; Promus DES for 90% mid LAD and 95% mid RCA disease     UPPER GASTROINTESTINAL ENDOSCOPY  04/23/2016   Dr.Perry    Family History  Problem Relation Age of Onset   Hypertension Mother    Stroke Mother        Several    Stomach cancer Mother    Glaucoma Mother    Coronary artery disease Father        in his 77s   Heart disease Father    Heart attack Father    Hypertension Paternal Grandmother    Hypertension Paternal Grandfather    Colon cancer Neg Hx    Rectal cancer Neg Hx      Social Connections: Socially Integrated (07/03/2023)   Social Connection and Isolation Panel [NHANES]    Frequency of Communication with Friends and Family: More than three times a week    Frequency of Social Gatherings with Friends and Family: More than three times a week    Attends Religious Services: More than 4 times per year    Active Member of [employer] or Organizations: Yes    Attends Engineer, structural: More than 4 times per year    Marital Status: Married      Current Outpatient  Medications:    albuterol (VENTOLIN HFA) 108 (90 Base) MCG/ACT inhaler, Inhale 2 puffs into the lungs every 6 (six) hours as needed for wheezing or shortness of breath., Disp: 8.5 each, Rfl: 3   amLODipine (NORVASC) 10 MG tablet, TAKE 1 TABLET BY MOUTH EVERY DAY, Disp: 90 tablet, Rfl: 2   aspirin 81 MG chewable tablet, Chew 81 mg by mouth daily., Disp: , Rfl:    Azelastine HCl 137 MCG/SPRAY SOLN, INSTILL 2 SPRAYS INTO BOTH NOSTRILS 2 TIMES DAILY AS DIRECTED, Disp: 90 mL, Rfl: 1   cetirizine (ZYRTEC) 10 MG tablet, Take 10 mg by mouth every morning., Disp: , Rfl:    Cyanocobalamin (VITAMIN B 12 PO), Take 1 tablet by mouth daily. , Disp: , Rfl:    diclofenac sodium (VOLTAREN) 1 % GEL, Apply 2 g topically 4 (four) times daily., Disp: 100 g, Rfl: 3   esomeprazole (NEXIUM) 40 MG capsule, TAKE 1 CAPSULE BY MOUTH DAILY BEFORE A MEAL, Disp: 90 capsule, Rfl: 2   ezetimibe (ZETIA) 10  MG tablet, TAKE 1 TABLET BY MOUTH EVERY DAY, Disp: 90 tablet, Rfl: 3   FeFum-FePoly-FA-B Cmp-C-Biot (INTEGRA PLUS) CAPS, TAKE 1 CAPSULE BY MOUTH DAILY, Disp: 90 capsule, Rfl: 3   hydrALAZINE (APRESOLINE) 50 MG tablet, TAKE 2 TABLETS BY MOUTH 3 TIMES A DAY, Disp: 540 tablet, Rfl: 3   loratadine (CLARITIN) 10 MG tablet, Take 10 mg by mouth at bedtime. , Disp: , Rfl:    LUMIGAN 0.01 % SOLN, Place 1 drop into the left eye at bedtime., Disp: , Rfl:    metoprolol succinate (TOPROL-XL) 50 MG 24 hr tablet, TAKE 1 TABLET EVERY DAY WITH OR IMMEDIATELY FOLLOWING A MEAL, Disp: 90 tablet, Rfl: 2   montelukast (SINGULAIR) 10 MG tablet, TAKE 1 TABLET BY MOUTH EVERYDAY AT BEDTIME, Disp: 90 tablet, Rfl: 3   Multiple Vitamin (MULTIVITAMIN WITH MINERALS) TABS tablet, Take 1 tablet by mouth daily., Disp: , Rfl:    nitroGLYCERIN (NITROSTAT) 0.4 MG SL tablet, Place 1 tablet (0.4 mg total) under the tongue every 5 (five) minutes as needed for chest pain (up to 3 doses. not within 24hrs of Viagra)., Disp: 25 tablet, Rfl: 6   pravastatin (PRAVACHOL) 10  MG tablet, TAKE 1 TABLET EVERY OTHER DAY, ALTERNATING WITH 2 TABLETS., Disp: 135 tablet, Rfl: 1   amoxicillin-clavulanate (AUGMENTIN) 875-125 MG tablet, Take 1 tablet by mouth 2 (two) times daily. (Patient not taking: Reported on 07/30/2023), Disp: 20 tablet, Rfl: 0   fluticasone (FLONASE) 50 MCG/ACT nasal spray, Place 2 sprays into both nostrils 2 (two) times daily., Disp: 16 g, Rfl: 6   traMADol (ULTRAM) 50 MG tablet, Take 1 tablet (50 mg total) by mouth every 12 (twelve) hours., Disp: 60 tablet, Rfl: 5   Physical Exam:   BP (!) 214/76 (BP Location: Left Arm, Patient Position: Sitting, Cuff Size: Large) Comment: Pt states just took HTN med  Pulse 69   SpO2 97%   Salient findings:  CN II-XII intact Given history and complaints, ear microscopy was indicated and performed for evaluation with findings as below in physical exam section and in procedures. Bilateral EAC clear and TM intact with well pneumatized middle ear space on left; right serous effusion; right tymp tube was placed (see below) Anterior rhinoscopy: Septum intact; bilateral inferior turbinates without significant hypertrophy; Nasal endoscopy was indicated to better evaluate the nose and paranasal sinuses and eustachian tube, given the patient's history and exam findings, and is detailed below. No lesions of oral cavity/oropharynx; dentition  No obviously palpable neck masses/lymphadenopathy/thyromegaly No respiratory distress or stridor  Seprately Identifiable Procedures:  PROCEDURE: Bilateral Diagnostic Rigid Nasal Endoscopy Pre-procedure diagnosis: Eustachian tube dysfunction right, unilateral ear effusion, rule out eustachian tube mass; nasal congestion Post-procedure diagnosis: same Indication: See pre-procedure diagnosis and physical exam above Complications: None apparent EBL: 0 mL Anesthesia: Lidocaine 4% and topical decongestant was topically sprayed in each nasal cavity  Description of Procedure:  Patient was  identified. A rigid 30 degree endoscope was utilized to evaluate the sinonasal cavities, mucosa, sinus ostia and turbinates and septum.  Overall, signs of mucosal inflammation are not noted.  No masses over either eustachian tube. No mucopurulence, polyps, or masses noted.   Right Middle meatus: clear Right SE Recess: clear Left MM: clear Left SE Recess: clear  CPT CODE -- 31231 - Mod 25  Procedure: Bilateral ear microscopy with RIGHT myringotomy and tympanostomy tube placement (CPT [phone]) Pre-procedure diagnosis:  Right chronic serous otitis media Right conductive hearing loss Right eustachian tube dysfunction Post-procedure diagnosis: same Indication:  Patient is a 78 y.o. male with pre-procedure diagnoses above who has failed medical management.. We discussed options: 1. Observation 2. Continued Nasal sprays 3. Tympanostomy tube. Risks discussed, patient opted for tympanostomy tube placement.  We had a long discussion about benefits and risks of Tympanostomy tube placement including pain, bleeding, infection, early or late extrusion, TM perforation, otorrhea, injury to middle or external ear structures, nature of tympanostomy tubes, cholesteatoma, hearing loss, among others. Consent obtained.  Findings: Right Ear: serous effusion anterior Left ear: no effusion Successful tympanostomy tube placement right - collar button  Complications: None apparent  Procedure details: Patient was placed semi recumbent on the exam chair. The right ear was addressed first with binocular microscopy and any cerumen was cleaned from the ear canal. The tympanic membrane was visualized, with findings as above. A focal spot over the anterior-inferior TM was anesthetized with topical phenol. A myringotomy incision was then made radially. Serous effusion was encountered. The middle ear cleft was suctioned. The PE tube was placed and ciprodex drops were applied. A cotton ball was placed at the meatus. The left  side was then examined with findings above.  Patient tolerated the procedure well    Impression & Plans:  Markell Sciascia is a 78 y.o. male with:  1. Sensorineural hearing loss (SNHL) of left ear with restricted hearing of right ear   2. Sensorineural hearing loss, bilateral   3. ETD (Eustachian tube dysfunction), right   4. Chronic serous otitis media of right ear   5. Sudden hearing loss left - at least since 2009  Noted hearing loss and ETD symptoms after URI which did not improve with medical management. No masses over eustachian tube. Tube placed on right today with improvement of symptoms.  We also again discussed that he may be a candidate for a cochlear implant on left or a CROS (from hearing standpoint) given degree of hearing loss; he would like to wait  - Continue flonase BID - Ofloxacin drops BID 4 drops AD x5d - Return precautions discussed; if any issues with right ear, advised to call us ASAP - f/u 6 weeks with audio  See below regarding exact medications prescribed this encounter including dosages and route: Meds ordered this encounter  Medications   ofloxacin (FLOXIN) 0.3 % OTIC solution    Sig: Place 4 drops into both ears 2 (two) times daily for 5 days.    Dispense:  5 mL    Refill:  0   fluticasone (FLONASE) 50 MCG/ACT nasal spray    Sig: Place 2 sprays into both nostrils 2 (two) times daily.    Dispense:  16 g    Refill:  6    Thank you for allowing me the opportunity to care for your patient. Please do not hesitate to contact me should you have any other questions.  Sincerely, Jovita Kussmaul, MD Otolaryngologist (ENT), [facility] Phone: [phone] Fax: [phone]  08/16/2023, 3:12 PM   MDM:  Level 4 - 99214 Complexity/Problems addressed: mod - multiple chronic problems Data complexity: low - Morbidity: mod  - Prescription Drug prescribed or managed: yes

## 2023-08-12 DIAGNOSIS — H401131 Primary open-angle glaucoma, bilateral, mild stage: Secondary | ICD-10-CM | POA: Diagnosis not present

## 2023-08-16 ENCOUNTER — Encounter (INDEPENDENT_AMBULATORY_CARE_PROVIDER_SITE_OTHER): Payer: Self-pay

## 2023-08-21 ENCOUNTER — Other Ambulatory Visit (HOSPITAL_COMMUNITY): Payer: Self-pay

## 2023-08-21 ENCOUNTER — Other Ambulatory Visit: Payer: Self-pay | Admitting: Family Medicine

## 2023-08-21 NOTE — Telephone Encounter (Signed)
 Please start a PA for Rx lovastatin  Thanks

## 2023-08-21 NOTE — Telephone Encounter (Signed)
 Good afternoon, no PA is needed at this time for lovastatin 10mg . His copay came back $1.94 after test bill

## 2023-08-22 NOTE — Telephone Encounter (Signed)
 Pharmacy comment: Alternative Requested:THE PRESCRIBED MEDICATION IS NOT COVERED BY INSURANCE. PLEASE CONSIDER CHANGING TO ONE OF THE SUGGESTED COVERED ALTERNATIVES.

## 2023-08-22 NOTE — Telephone Encounter (Signed)
 Please let patient know insurance does not cover his pravastatin any longer.  We will switch to lovastatin.  We can check cholesterol at his next office visit here.

## 2023-08-26 ENCOUNTER — Ambulatory Visit (INDEPENDENT_AMBULATORY_CARE_PROVIDER_SITE_OTHER): Payer: Medicare HMO

## 2023-08-26 ENCOUNTER — Ambulatory Visit (INDEPENDENT_AMBULATORY_CARE_PROVIDER_SITE_OTHER): Payer: Medicare HMO | Admitting: Audiology

## 2023-09-05 ENCOUNTER — Other Ambulatory Visit: Payer: Self-pay | Admitting: *Deleted

## 2023-09-05 MED ORDER — METOPROLOL SUCCINATE ER 50 MG PO TB24: ORAL_TABLET | ORAL | 2 refills | Status: DC

## 2023-09-12 ENCOUNTER — Ambulatory Visit (INDEPENDENT_AMBULATORY_CARE_PROVIDER_SITE_OTHER): Payer: Medicare HMO | Admitting: Otolaryngology

## 2023-09-12 ENCOUNTER — Ambulatory Visit (INDEPENDENT_AMBULATORY_CARE_PROVIDER_SITE_OTHER): Payer: Medicare HMO | Admitting: Audiology

## 2023-09-12 ENCOUNTER — Encounter (INDEPENDENT_AMBULATORY_CARE_PROVIDER_SITE_OTHER): Payer: Self-pay

## 2023-09-12 VITALS — BP 167/70 | HR 77 | Ht 67.0 in | Wt 249.0 lb

## 2023-09-12 DIAGNOSIS — H6991 Unspecified Eustachian tube disorder, right ear: Secondary | ICD-10-CM | POA: Diagnosis not present

## 2023-09-12 DIAGNOSIS — H903 Sensorineural hearing loss, bilateral: Secondary | ICD-10-CM

## 2023-09-12 DIAGNOSIS — H6521 Chronic serous otitis media, right ear: Secondary | ICD-10-CM

## 2023-09-12 NOTE — Progress Notes (Signed)
  [address], [address] [place], [place] [address] [phone]  Audiological Evaluation    Name: Todd Spears     DOB:   [birth year]      MRN:   [id number]                                                                                     Service Date: 09/12/2023     Accompanied by: unaccompanied to the booth   Patient comes today after Dr. Allena Katz, ENT sent a referral for a hearing evaluation due to concerns with post operatory hearing status after right PE tube was placed .   Symptoms Yes Details  Hearing loss  [x]  07-04-2023: Right ear- Mild to severe mixed hearing loss from 125 Hz - 8000 Hz. Left ear-  Mild to profound mixed hearing loss from 125 Hz - 8000 Hz. Significant difference in between ears, essentially worse across frequencies for the left ear.   Tinnitus  []    Ear pain/ infections/pressure  []    Balance problems  []    Noise exposure history  []    Previous ear surgeries  []    Family history of hearing loss  []    Amplification  []    Other  []      Otoscopy: Right ear: Non-occluding cerumen, able to visualize notable tympanic membrane landmarks Left ear:  Clear external ear canals and notable landmarks visualized on the tympanic membrane.  Tympanometry: Right ear: Type B- Large external ear canal volume with no middle ear pressure peak or tympanic membrane compliance Left ear: Type A- Normal external ear canal volume with normal middle ear pressure and tympanic membrane compliance   Pure tone Audiometry: Right ear- Normal sloping to moderately severe sensorineural hearing loss from 125 Hz - 8000 Hz. Left ear-  Borderline normal to profound sensorineural hearing loss from 125 Hz - 750 Hz, then  no response from 1000- 8000 Hz.  Speech Audiometry: Right ear- Speech Reception Threshold (SRT) was obtained at 30 dBHL. Left ear-Speech Awareness Threshold (SAT) was observed at 75 dBHL, with contralateral masking   Word Recognition Score Tested using NU-6 (MLV) Right  ear: 84% was obtained at a presentation level of 75 dBHL with contralateral masking which is deemed as  good . Left OZH:YQMVH not test due to degree of hearing loss.   The hearing test results were completed under headphones and results are deemed to be of good to fair reliability. Test technique:  conventional      Recommendations: Follow up with ENT as scheduled for today. Return for a hearing evaluation if concerns with hearing changes arise or per MD recommendation. Consider a communication needs assessment after medical clearance for hearing aids is obtained.   Eline Geng MARIE LEROUX-MARTINEZ, AUD

## 2023-09-25 ENCOUNTER — Ambulatory Visit: Payer: Medicare HMO | Admitting: Podiatry

## 2023-09-29 NOTE — Progress Notes (Signed)
 Dear Dr. Daneil Dunker, Here is my assessment for our mutual patient, Todd Spears. Thank you for allowing me the opportunity to care for your patient. Please do not hesitate to contact me should you have any other questions. Sincerely, Dr. Milon Aloe  Otolaryngology Clinic Note Referring provider: Dr. Daneil Dunker HPI:  Todd Spears is a 78 y.o. male kindly referred by Dr. Daneil Dunker for evaluation of ear fullness.   Initial visit (06/2023): Patient reports: right ear fullness and pain in December during a URI. Got two courses of augmentin , two shots of steroids, improved URI symptoms and pain. Now persistent right ear fullness.  He is able to hear some from left (very little), but lost hearing in that ear suddenly in his 73s (normal hearing before it) -- no antecedent event. He saw an ENT and audiology, and reported he had "never damage". Does not know how it happened but does note it was sudden hearing loss. Does not wear hearing aid. He reports he cannot takes steroids as he "swells up" No general sinusitis or nasal trouble. Patient currently denies: ear pain, vertigo, drainage, tinnitus Patient additionally denies: deep pain in ear canal, eustachian tube symptoms such as popping/crackling, sensitive to pressure changes Patient also denies barotrauma, vestibular suppressant use, ototoxic medication use Prior ear surgery: no  No autoimmune symptoms. No recent hearing tests.  --------------------------------------------------------- 07/30/2023 Seen in follow up today. Unfortunately, he still continues to have right sided ear fullness, popping, and trouble hearing. He has been using flonase  and insufflating his ears, but no significant benefit. We discussed options again and he wishes to have a tube placed.  --------------------------------------------------------- 09/12/2023 Returns for recheck. He reports that he is doing much better after tymp tube. Hearing back to baseline. No fullness, popping,  drainage, vertigo. He has been using nasal sprays. He did have an audiogram today ----------------------------------------------------------  H&N Surgery: denies Personal or FHx of bleeding dz or anesthesia difficulty: no   GLP-1: no AP/AC: ASA 81  Tobacco: former (only smoked ~1 year). Lives in [place], [place]  PMHx: HTN, Aortic stenosis, CAD s/p PCI (2013), Gout, OA, prior hyponatremia, KD Independent Review of Additional Tests or Records:  Dr. Daneil Dunker (FM) 06/16/2023): Noted ear fullness, decreaesd hearing; improving ME effusion, c/f ETD; improved with steroid injection, now given another; Rpt course of augmentin ; Dx: ETD, ME effusion, Rx: ENT Dr. Daneil Dunker 06/03/2023: Noted ME effusion with c/f ETD; recent URI; prior rxn to steroids; has HL; Rx: Augmentin ; f/u 2 weeks Dr. Aron Lard (FM) 05/21/2023: URI sx, throat drainage, sinus and ear sx including pain; Dx: Bronchitis, URI; Rx: Oral abx (Z pak), mucinex  09/2023 Audio reviewed: resolution of ABG on right with improvement in thresholds when compared to 07/2023 audio; WRT 84% at 75dB; AS with stable thresholds. Type A tymp AS, large vol AD    07/2023 Audio/Tymps interpreted independently: B AD; A AS 06/2023 Audiogram was independently reviewed and interpreted by me and it reveals AD: B tymp; AS: A tymp; Left - mild then profound mixed(?) HL -- essentially SNHL; CNT discrim score; Right: mild to severe mixed HL, but noted mixed dilemma; clear asymmetry; AD: 84% at 95dB WRT   SNHL= Sensorineural hearing loss  Audio [facility] (Dr. Stephenie Einstein) 12/2007 and 11/03/2007 independently reviewed:quite similar compared to 2025 on left; on right, low freq declined but could be due to acute effusion   BMP 03/13/2023, CBC 01/28/2023 reviewed: WBC 7.8, Plt 281, Hgb 11.2, BMP essentially wnl, Na borderline  MRI Brain 08/07/2021 independently reviewed with respect to  ears: no obvious retrocochlear; mild paranasal disease disease b/l - right max; no NP lesions  noted  [facility] 08/07/2021 independently reviewed: no significant mastoid or ME effusion; no otic capsule abnormalities; cuts thick  PMH/Meds/All/SocHx/FamHx/ROS:   Past Medical History:  Diagnosis Date   Allergic rhinitis    Anemia    Asthma    CAD (coronary artery disease)    a. s/p PTCA/stenting of the mid LAD and mid RCA 04/06/12   Carotid bruit    r   Colon polyp    Diverticulosis    ED (erectile dysfunction)    Fatty liver    GERD (gastroesophageal reflux disease)    Gout    Hyperlipidemia    Hypertension    Obesity    Osteoarthritis    Renal insufficiency    Restless leg syndrome      Past Surgical History:  Procedure Laterality Date   BACK SURGERY  2020   COLONOSCOPY  04/23/2016   Dr.Perry   INGUINAL HERNIA REPAIR Right 02/27/2016   Procedure: HERNIA REPAIR INGUINAL ADULT;  Surgeon: Enid Harry, MD;  Location: [facility];  Service: General;  Laterality: Right;   INSERTION OF MESH Right 02/27/2016   Procedure: INSERTION OF MESH--IN AND OUT CATHETERIZATION PERFORMED AT END OF PROCEDURE TO DRAIN URINE.;  Surgeon: Enid Harry, MD;  Location: [facility];  Service: General;  Laterality: Right;   LYMPH NODE BIOPSY     right side of neck 1999 - benign   PERCUTANEOUS CORONARY STENT INTERVENTION (PCI-S) N/A 04/06/2012   Procedure: PERCUTANEOUS CORONARY STENT INTERVENTION (PCI-S);  Surgeon: Peter M Swaziland, MD; Promus DES for 90% mid LAD and 95% mid RCA disease     UPPER GASTROINTESTINAL ENDOSCOPY  04/23/2016   Dr.Perry    Family History  Problem Relation Age of Onset   Hypertension Mother    Stroke Mother        Several    Stomach cancer Mother    Glaucoma Mother    Coronary artery disease Father        in his 57s   Heart disease Father    Heart attack Father    Hypertension Paternal Grandmother    Hypertension Paternal Grandfather    Colon cancer Neg Hx    Rectal cancer Neg Hx      Social Connections: Socially  Integrated (07/03/2023)   Social Connection and Isolation Panel [NHANES]    Frequency of Communication with Friends and Family: More than three times a week    Frequency of Social Gatherings with Friends and Family: More than three times a week    Attends Religious Services: More than 4 times per year    Active Member of [employer] or Organizations: Yes    Attends Engineer, structural: More than 4 times per year    Marital Status: Married      Current Outpatient Medications:    albuterol  (VENTOLIN  HFA) 108 (90 Base) MCG/ACT inhaler, Inhale 2 puffs into the lungs every 6 (six) hours as needed for wheezing or shortness of breath., Disp: 8.5 each, Rfl: 3   amLODipine  (NORVASC ) 10 MG tablet, TAKE 1 TABLET BY MOUTH EVERY DAY, Disp: 90 tablet, Rfl: 2   amoxicillin -clavulanate (AUGMENTIN ) 875-125 MG tablet, Take 1 tablet by mouth 2 (two) times daily., Disp: 20 tablet, Rfl: 0   aspirin  81 MG chewable tablet, Chew 81 mg by mouth daily., Disp: , Rfl:    Azelastine  HCl 137 MCG/SPRAY SOLN, INSTILL 2 SPRAYS INTO  BOTH NOSTRILS 2 TIMES DAILY AS DIRECTED, Disp: 90 mL, Rfl: 1   cetirizine (ZYRTEC) 10 MG tablet, Take 10 mg by mouth every morning., Disp: , Rfl:    Cyanocobalamin  (VITAMIN B 12 PO), Take 1 tablet by mouth daily. , Disp: , Rfl:    diclofenac  sodium (VOLTAREN ) 1 % GEL, Apply 2 g topically 4 (four) times daily., Disp: 100 g, Rfl: 3   esomeprazole  (NEXIUM ) 40 MG capsule, TAKE 1 CAPSULE BY MOUTH DAILY BEFORE A MEAL, Disp: 90 capsule, Rfl: 2   ezetimibe  (ZETIA ) 10 MG tablet, TAKE 1 TABLET BY MOUTH EVERY DAY, Disp: 90 tablet, Rfl: 3   FeFum-FePoly-FA-B Cmp-C-Biot (INTEGRA PLUS ) CAPS, TAKE 1 CAPSULE BY MOUTH DAILY, Disp: 90 capsule, Rfl: 3   fluticasone  (FLONASE ) 50 MCG/ACT nasal spray, Place 2 sprays into both nostrils 2 (two) times daily., Disp: 16 g, Rfl: 6   hydrALAZINE  (APRESOLINE ) 50 MG tablet, TAKE 2 TABLETS BY MOUTH 3 TIMES A DAY, Disp: 540 tablet, Rfl: 3   loratadine  (CLARITIN ) 10 MG tablet,  Take 10 mg by mouth at bedtime. , Disp: , Rfl:    lovastatin (MEVACOR) 10 MG tablet, , Disp: 30 tablet, Rfl: 5   LUMIGAN 0.01 % SOLN, Place 1 drop into the left eye at bedtime., Disp: , Rfl:    metoprolol  succinate (TOPROL -XL) 50 MG 24 hr tablet, TAKE 1 TABLET EVERY DAY WITH OR IMMEDIATELY FOLLOWING A MEAL, Disp: 90 tablet, Rfl: 2   montelukast  (SINGULAIR ) 10 MG tablet, TAKE 1 TABLET BY MOUTH EVERYDAY AT BEDTIME, Disp: 90 tablet, Rfl: 3   Multiple Vitamin (MULTIVITAMIN WITH MINERALS) TABS tablet, Take 1 tablet by mouth daily., Disp: , Rfl:    nitroGLYCERIN  (NITROSTAT ) 0.4 MG SL tablet, Place 1 tablet (0.4 mg total) under the tongue every 5 (five) minutes as needed for chest pain (up to 3 doses. not within 24hrs of Viagra )., Disp: 25 tablet, Rfl: 6   traMADol  (ULTRAM ) 50 MG tablet, Take 1 tablet (50 mg total) by mouth every 12 (twelve) hours., Disp: 60 tablet, Rfl: 5   Physical Exam:   BP (!) 167/70 (BP Location: Left Arm, Patient Position: Sitting, Cuff Size: Large)   Pulse 77   Ht 5\' 7"  (1.702 m)   Wt 249 lb (112.9 kg)   SpO2 95%   BMI 39.00 kg/m   Salient findings:  CN II-XII intact Given history and complaints, ear microscopy was indicated and performed for evaluation with findings as below in physical exam section and in procedures. Bilateral EAC clear and TM intact with well pneumatized middle ear space on left; on right, PE tube is dry and patent and in place; no evidence of infection Anterior rhinoscopy: Septum intact; bilateral inferior turbinates without significant hypertrophy  Seprately Identifiable Procedures:  Procedure: Bilateral ear microscopy using microscope (CPT 92504) Pre-procedure diagnosis: right tympanostomy tube placement; bilateral hearing loss Post-procedure diagnosis: same Indication: see above; given patient's otologic complaints and history, for improved and comprehensive examination of external ear and tympanic membrane, bilateral otologic examination using  microscope was performed  Procedure: Patient was placed semi-recumbent. Both ear canals were examined using the microscope with findings above.  Patient tolerated the procedure well.   Prior nasal endo did not show any masses over ET  Impression & Plans:  Derl Gregorius is a 78 y.o. male with:  1. Dysfunction of right eustachian tube   2. Sensorineural hearing loss, bilateral   3. Chronic serous otitis media of right ear   ---  Sudden hearing loss  left - at least since 2009  Noted hearing loss and ETD symptoms after URI which did not improve with medical management. No masses over eustachian tube. Tube placed on right with improvement in symptoms  We also again discussed that he may be a candidate for a cochlear implant on left or a CROS (from hearing standpoint) given degree of hearing loss; he will think about this  - Continue flonase  BID - Ofloxacin  drops now PRN should he have issue with right ear - Return precautions discussed; if any issues with right ear, advised to call us  ASAP - f/u 6 months  See below regarding exact medications prescribed this encounter including dosages and route: No orders of the defined types were placed in this encounter.   Thank you for allowing me the opportunity to care for your patient. Please do not hesitate to contact me should you have any other questions.  Sincerely, Milon Aloe, MD Otolaryngologist (ENT), [facility] Phone: [phone] Fax: [phone]  09/29/2023, 9:02 PM   MDM:  Level 4 - 99213 Complexity/Problems addressed: mod - multiple chronic problems Data complexity: low - Morbidity: low - Prescription Drug prescribed or managed: no

## 2023-09-30 ENCOUNTER — Encounter: Payer: Self-pay | Admitting: Podiatry

## 2023-09-30 ENCOUNTER — Ambulatory Visit: Admitting: Podiatry

## 2023-09-30 DIAGNOSIS — B351 Tinea unguium: Secondary | ICD-10-CM

## 2023-09-30 DIAGNOSIS — M79675 Pain in left toe(s): Secondary | ICD-10-CM | POA: Diagnosis not present

## 2023-09-30 DIAGNOSIS — N182 Chronic kidney disease, stage 2 (mild): Secondary | ICD-10-CM

## 2023-09-30 DIAGNOSIS — M79674 Pain in right toe(s): Secondary | ICD-10-CM

## 2023-09-30 NOTE — Progress Notes (Signed)
 This patient returns to my office for at risk foot care.  This patient requires this care by a professional since this patient will be at risk due to having chronic kidney disease.  Patient has painful callus on the outside bottom of both feet.  This patient is unable to cut nails himself since the patient cannot reach his nails.These nails are painful walking and wearing shoes. Patient has painful callus on the outside ball both feet.  Patient is unable to self treat.   This patient presents for at risk foot care today.  General Appearance  Alert, conversant and in no acute stress.  Vascular  Dorsalis pedis and posterior tibial  pulses are palpable  bilaterally.  Capillary return is within normal limits  bilaterally. Temperature is within normal limits  bilaterally.  Neurologic  Senn-Weinstein monofilament wire test within normal limits  bilaterally. Muscle power within normal limits bilaterally.  Nails Thick disfigured discolored nails with subungual debris  from hallux to fifth toes bilaterally. No evidence of bacterial infection or drainage bilaterally.  Orthopedic  No limitations of motion  feet .  No crepitus or effusions noted.  No bony pathology or digital deformities noted.  Skin  normotropic skin with no porokeratosis noted bilaterally.  No signs of infections or ulcers noted.    Onychomycosis  Pain in right toes  Pain in left foot   Consent was obtained for treatment procedures.   Mechanical debridement of nails 1-5  bilaterally performed with a nail nipper.  Filed with dremel without incident. No infection or ulcer.  Debride callus with a  dremel tool..   Return office visit     3 months                 Told patient to return for periodic foot care and evaluation due to potential at risk complications.   Ruffin Cotton DPM

## 2023-11-03 ENCOUNTER — Ambulatory Visit: Payer: Self-pay | Admitting: *Deleted

## 2023-11-03 ENCOUNTER — Telehealth: Payer: Self-pay | Admitting: Family Medicine

## 2023-11-03 NOTE — Telephone Encounter (Signed)
 Reason for Disposition  [1] Caller has URGENT medicine question about med that PCP or specialist prescribed AND [2] triager unable to answer question  Answer Assessment - Initial Assessment Questions 1. NAME of MEDICINE: "What medicine(s) are you calling about?" Pravastatin   I ran out of it a week ago.  I'm needing a refill.   I got a call from a nurse about it.   I was just returning the call.  In referencing his chart Dr. Daneil Dunker discontinued his pravastatin  08/22/2023 because his insurance no longer covers pravastatin .   He was changed to lovastatin so his insurance would cover it.    Pt said, "I've been on the pravastatin ".   "I just ran out of it last week".   "I have an appt with Dr. Daneil Dunker coming up where he is doing my blood work and all".    "I'll just talk with Dr. Daneil Dunker about it during my appt".      I let him know that's the reason he got a call about the pravastatin  is because he is supposed to be on lovastatin now due to insurance coverage.   He kept saying,   "I'll just talk with Dr. Daneil Dunker about it during my appt".   "Just never mind".   He has an upcoming appt on 11/13/2023 with Dr. Daneil Dunker.  Protocols used: Medication Question Call-A-AH

## 2023-11-03 NOTE — Telephone Encounter (Signed)
 The patient is calling back returning a call from NT and I will transfer him to NT

## 2023-11-03 NOTE — Telephone Encounter (Unsigned)
 Copied from CRM [id number]. Topic: Clinical - Medication Refill >> Nov 03, 2023  8:24 AM Zipporah Him wrote: Medication: pravastatin   Has the patient contacted their pharmacy? Yes (Agent: If no, request that the patient contact the pharmacy for the refill. If patient does not wish to contact the pharmacy document the reason why and proceed with request.) (Agent: If yes, when and what did the pharmacy advise?)  This is the patient's preferred pharmacy:  [id number] - [place], [place] - [address] [place] [address] Phone: [phone] Fax: [phone]    Is this the correct pharmacy for this prescription? Yes If no, delete pharmacy and type the correct one.   Has the prescription been filled recently? No  Is the patient out of the medication? Yes  Has the patient been seen for an appointment in the last year OR does the patient have an upcoming appointment? Yes  Can we respond through MyChart? Yes  Agent: Please be advised that Rx refills may take up to 3 business days. We ask that you follow-up with your pharmacy.

## 2023-11-03 NOTE — Telephone Encounter (Signed)
  Chief Complaint: Pt returning a call from prior conversation with triage nurse regarding pravastatin  refill request.   I relayed the information from the note from Dr. Daneil Dunker dated 08/22/2023 at 9:21 AM that his insurance no longer covers pravastatin  so Dr. Daneil Dunker changed him over to lovastatin instead.   No sure this message ever got relayed to the pt.  He did not know anything about this.     "I ran out of pravastatin  last week and need a refill".   When I explained to him about the insurance situation he repeatedly said,   "I'll just talk with Dr. Daneil Dunker about it during my appt coming up on 6/12".    He has been off the pravastatin  since he ran out last week. Symptoms: N/A Frequency: N/A Pertinent Negatives: Patient denies N/A Disposition: [] ED /[] Urgent Care (no appt availability in office) / [] Appointment(In office/virtual)/ []  [facility]/ [] Home Care/ [] Refused Recommended Disposition /[] [facility]/ [x]  Follow-up with PCP Additional Notes: Message sent to Dr. Daneil Dunker so he would be aware of the situation when pt comes in for his appt on 11/13/2023.

## 2023-11-03 NOTE — Telephone Encounter (Signed)
 Called patient to confirm medication requested. Patient reports he is out of pravastatin  and unaware to stop taking medication. Reviewed in chart, pravastatin  was discontinued 08/22/23. Patient reports he will review with PCP at next OV. In closer review in chart, message from PCP on 08/21/23 pravastatin  no longer covered by patient insurance and lovastatin ordered. Called patient back , no answer, LVMTCB to review PCP message from 08/21/23 with patient.  Future appt already scheduled for 11/13/23.

## 2023-11-04 NOTE — Telephone Encounter (Signed)
 Spoke with patient, stated been taking Rx pravastatin   was unaware insurance will not pay  Running out of Pravastatin  Need refills please advise

## 2023-11-04 NOTE — Telephone Encounter (Signed)
 Patient is requesting call back.    States he was informed that insurance will no longer cover Pravastatin .    Patient states he is currently taking pravastatin  and Ezetimibe  for cholesterol.    States he is out of the pravastatin .

## 2023-11-04 NOTE — Telephone Encounter (Signed)
 Noted. We can discuss more at his upcoming visit.  Jinny Mounts. Daneil Dunker, MD 11/04/2023 7:45 AM

## 2023-11-05 ENCOUNTER — Other Ambulatory Visit: Payer: Self-pay | Admitting: *Deleted

## 2023-11-05 MED ORDER — PRAVASTATIN SODIUM 10 MG PO TABS: ORAL_TABLET | ORAL | 1 refills | Status: DC

## 2023-11-05 NOTE — Telephone Encounter (Signed)
[facility]  Patient notified

## 2023-11-05 NOTE — Telephone Encounter (Signed)
 It is ok to refill his pravastatin  but please let patient know insurance may not pay for this.  Jinny Mounts. Daneil Dunker, MD 11/05/2023 7:53 AM

## 2023-11-11 ENCOUNTER — Other Ambulatory Visit: Payer: Self-pay | Admitting: Family Medicine

## 2023-11-11 NOTE — Telephone Encounter (Signed)
 Pharmacy comment: Alternative Requested:INSURANCE WONT PAY FOR  MORE THAN 1 PILL DAILY, PLEASE RESEND AS 2 SEPARATE RXS, ONE FOR 10MG  AND ONE FOR 20MG  QOD.

## 2023-11-12 NOTE — Telephone Encounter (Signed)
 Please see pharmacy note and sent in 2 prescriptions for his pravastatin  10 mg every other day and pravastatin  20 mg every other day.

## 2023-11-13 ENCOUNTER — Encounter: Admitting: Family Medicine

## 2023-11-18 DIAGNOSIS — H401131 Primary open-angle glaucoma, bilateral, mild stage: Secondary | ICD-10-CM | POA: Diagnosis not present

## 2023-11-19 ENCOUNTER — Telehealth: Payer: Self-pay | Admitting: *Deleted

## 2023-11-19 ENCOUNTER — Other Ambulatory Visit: Payer: Self-pay | Admitting: *Deleted

## 2023-11-19 NOTE — Telephone Encounter (Signed)
 Copied from CRM [id number]. Topic: General - Other >> Nov 19, 2023  8:07 AM Danae Duncans wrote: Reason for CRM: Pt request for PCP nurse to call back - [phone]   Spoke with patient, patient has issue corrected at the pharmacy  No help needed at this time  [facility]

## 2023-12-09 ENCOUNTER — Encounter: Payer: Self-pay | Admitting: Family Medicine

## 2023-12-09 ENCOUNTER — Ambulatory Visit (INDEPENDENT_AMBULATORY_CARE_PROVIDER_SITE_OTHER): Admitting: Family Medicine

## 2023-12-09 VITALS — BP 158/64 | HR 66 | Temp 97.7°F | Ht 67.0 in | Wt 247.0 lb

## 2023-12-09 DIAGNOSIS — E7800 Pure hypercholesterolemia, unspecified: Secondary | ICD-10-CM | POA: Diagnosis not present

## 2023-12-09 DIAGNOSIS — H6990 Unspecified Eustachian tube disorder, unspecified ear: Secondary | ICD-10-CM | POA: Insufficient documentation

## 2023-12-09 DIAGNOSIS — I779 Disorder of arteries and arterioles, unspecified: Secondary | ICD-10-CM

## 2023-12-09 DIAGNOSIS — R739 Hyperglycemia, unspecified: Secondary | ICD-10-CM | POA: Diagnosis not present

## 2023-12-09 DIAGNOSIS — N182 Chronic kidney disease, stage 2 (mild): Secondary | ICD-10-CM | POA: Diagnosis not present

## 2023-12-09 DIAGNOSIS — H6991 Unspecified Eustachian tube disorder, right ear: Secondary | ICD-10-CM

## 2023-12-09 DIAGNOSIS — Z0001 Encounter for general adult medical examination with abnormal findings: Secondary | ICD-10-CM

## 2023-12-09 DIAGNOSIS — J309 Allergic rhinitis, unspecified: Secondary | ICD-10-CM

## 2023-12-09 DIAGNOSIS — I10 Essential (primary) hypertension: Secondary | ICD-10-CM

## 2023-12-09 LAB — LIPID PANEL
Cholesterol: 159 mg/dL (ref 0–200)
HDL: 49.5 mg/dL (ref >39.00)
LDL Cholesterol: 75 mg/dL (ref 0–99)
NonHDL: 109.3
Total CHOL/HDL Ratio: 3
Triglycerides: 172 mg/dL — ABNORMAL HIGH (ref 0.0–149.0)
VLDL: 34.4 mg/dL (ref 0.0–40.0)

## 2023-12-09 LAB — COMPREHENSIVE METABOLIC PANEL WITH GFR
ALT: 35 U/L (ref 0–53)
AST: 37 U/L (ref 0–37)
Albumin: 4.3 g/dL (ref 3.5–5.2)
Alkaline Phosphatase: 39 U/L (ref 39–117)
BUN: 17 mg/dL (ref 6–23)
CO2: 28 meq/L (ref 19–32)
Calcium: 9.4 mg/dL (ref 8.4–10.5)
Chloride: 101 meq/L (ref 96–112)
Creatinine, Ser: 1.07 mg/dL (ref 0.40–1.50)
GFR: 66.47 mL/min (ref >60.00)
Glucose, Bld: 126 mg/dL — ABNORMAL HIGH (ref 70–99)
Potassium: 4 meq/L (ref 3.5–5.1)
Sodium: 136 meq/L (ref 135–145)
Total Bilirubin: 0.7 mg/dL (ref 0.2–1.2)
Total Protein: 7.5 g/dL (ref 6.0–8.3)

## 2023-12-09 LAB — TSH: TSH: 2.73 u[IU]/mL (ref 0.35–5.50)

## 2023-12-09 LAB — MAGNESIUM: Magnesium: 2 mg/dL (ref 1.5–2.5)

## 2023-12-09 LAB — CBC
HCT: 39.4 % (ref 39.0–52.0)
Hemoglobin: 13.3 g/dL (ref 13.0–17.0)
MCHC: 33.8 g/dL (ref 30.0–36.0)
MCV: 94.3 fl (ref 78.0–100.0)
Platelets: 204 K/uL (ref 150.0–400.0)
RBC: 4.18 Mil/uL — ABNORMAL LOW (ref 4.22–5.81)
RDW: 13.1 % (ref 11.5–15.5)
WBC: 5.6 K/uL (ref 4.0–10.5)

## 2023-12-09 LAB — VITAMIN B12: Vitamin B-12: >1500 pg/mL — ABNORMAL HIGH (ref 211–911)

## 2023-12-09 LAB — HEMOGLOBIN A1C: Hgb A1c MFr Bld: 6.5 % (ref 4.6–6.5)

## 2023-12-09 NOTE — Assessment & Plan Note (Signed)
 Checking to see.

## 2023-12-09 NOTE — Assessment & Plan Note (Signed)
 Initially elevated. 158/64 on recheck.  His home readings are typically well-controlled and he was reasonably well-controlled at his most recent visit with cardiology.  We did discuss increasing dose of metoprolol  today however he like to hold off on this for now.  He has been on ACE/ARB and spironolactone  in the past but could not tolerate due to issues with hyponatremia.  Will continue his current regimen metoprolol  succinate 50 mg daily, hydralazine  100 mg 3 times daily, and amlodipine  10 mg daily.  Will follow-up with us  in a few weeks via MyChart we can adjust if needed elevated at home.

## 2023-12-09 NOTE — Assessment & Plan Note (Signed)
 Overall stable on Claritin , Singulair , and Astelin .

## 2023-12-09 NOTE — Assessment & Plan Note (Signed)
 Tympanostomy tube in place in right ear.  Continue management per ENT.

## 2023-12-09 NOTE — Patient Instructions (Signed)
 It was very nice to see you today!  We will check blood work today.  Please monitor your blood pressure at home and let us  know if persistently elevated.  Please continue to work on diet and exercise.  I will see back in year for your next physical.  Please come back sooner if needed.  Return in about 1 year (around 12/08/2024) for Annual Physical.   Take care, Dr Kennyth  PLEASE NOTE:  If you had any lab tests, please let us  know if you have not heard back within a few days. You may see your results on mychart before we have a chance to review them but we will give you a call once they are reviewed by us .   If we ordered any referrals today, please let us  know if you have not heard from their office within the next week.   If you had any urgent prescriptions sent in today, please check with the pharmacy within an hour of our visit to make sure the prescription was transmitted appropriately.   Please try these tips to maintain a healthy lifestyle:  Eat at least 3 REAL meals and 1-2 snacks per day.  Aim for no more than 5 hours between eating.  If you eat breakfast, please do so within one hour of getting up.   Each meal should contain half fruits/vegetables, one quarter protein, and one quarter carbs (no bigger than a computer mouse)  Cut down on sweet beverages. This includes juice, soda, and sweet tea.   Drink at least 1 glass of water with each meal and aim for at least 8 glasses per day  Exercise at least 150 minutes every week.     Preventive Care 24 Years and Older, Male Preventive care refers to lifestyle choices and visits with your health care provider that can promote health and wellness. Preventive care visits are also called wellness exams. What can I expect for my preventive care visit? Counseling During your preventive care visit, your health care provider may ask about your: Medical history, including: Past medical problems. Family medical history. History of  falls. Current health, including: Emotional well-being. Home life and relationship well-being. Sexual activity. Memory and ability to understand (cognition). Lifestyle, including: Alcohol, nicotine or tobacco, and drug use. Access to firearms. Diet, exercise, and sleep habits. Work and work Astronomer. Sunscreen use. Safety issues such as seatbelt and bike helmet use. Physical exam Your health care provider will check your: Height and weight. These may be used to calculate your BMI (body mass index). BMI is a measurement that tells if you are at a healthy weight. Waist circumference. This measures the distance around your waistline. This measurement also tells if you are at a healthy weight and may help predict your risk of certain diseases, such as type 2 diabetes and high blood pressure. Heart rate and blood pressure. Body temperature. Skin for abnormal spots. What immunizations do I need?  Vaccines are usually given at various ages, according to a schedule. Your health care provider will recommend vaccines for you based on your age, medical history, and lifestyle or other factors, such as travel or where you work. What tests do I need? Screening Your health care provider may recommend screening tests for certain conditions. This may include: Lipid and cholesterol levels. Diabetes screening. This is done by checking your blood sugar (glucose) after you have not eaten for a while (fasting). Hepatitis C test. Hepatitis B test. HIV (human immunodeficiency virus) test. STI (sexually  transmitted infection) testing, if you are at risk. Lung cancer screening. Colorectal cancer screening. Prostate cancer screening. Abdominal aortic aneurysm (AAA) screening. You may need this if you are a current or former smoker. Talk with your health care provider about your test results, treatment options, and if necessary, the need for more tests. Follow these instructions at home: Eating and  drinking  Eat a diet that includes fresh fruits and vegetables, whole grains, lean protein, and low-fat dairy products. Limit your intake of foods with high amounts of sugar, saturated fats, and salt. Take vitamin and mineral supplements as recommended by your health care provider. Do not drink alcohol if your health care provider tells you not to drink. If you drink alcohol: Limit how much you have to 0-2 drinks a day. Know how much alcohol is in your drink. In the [place] one drink equals one 12 oz bottle of beer (355 mL), one 5 oz glass of wine (148 mL), or one 1 oz glass of hard liquor (44 mL). Lifestyle Brush your teeth every morning and night with fluoride  toothpaste. Floss one time each day. Exercise for at least 30 minutes 5 or more days each week. Do not use any products that contain nicotine or tobacco. These products include cigarettes, chewing tobacco, and vaping devices, such as e-cigarettes. If you need help quitting, ask your health care provider. Do not use drugs. If you are sexually active, practice safe sex. Use a condom or other form of protection to prevent STIs. Take aspirin  only as told by your health care provider. Make sure that you understand how much to take and what form to take. Work with your health care provider to find out whether it is safe and beneficial for you to take aspirin  daily. Ask your health care provider if you need to take a cholesterol-lowering medicine (statin). Find healthy ways to manage stress, such as: Meditation, yoga, or listening to music. Journaling. Talking to a trusted person. Spending time with friends and family. Safety Always wear your seat belt while driving or riding in a vehicle. Do not drive: If you have been drinking alcohol. Do not ride with someone who has been drinking. When you are tired or distracted. While texting. If you have been using any mind-altering substances or drugs. Wear a helmet and other protective equipment  during sports activities. If you have firearms in your house, make sure you follow all gun safety procedures. Minimize exposure to UV radiation to reduce your risk of skin cancer. What's next? Visit your health care provider once a year for an annual wellness visit. Ask your health care provider how often you should have your eyes and teeth checked. Stay up to date on all vaccines. This information is not intended to replace advice given to you by your health care provider. Make sure you discuss any questions you have with your health care provider. Document Revised: 11/15/2020 Document Reviewed: 11/15/2020 Elsevier Patient Education  2024 [employer].

## 2023-12-09 NOTE — Assessment & Plan Note (Signed)
 On pravastatin  10 mg and 20 mg on alternating days.  Will check lipids today.

## 2023-12-09 NOTE — Assessment & Plan Note (Signed)
-   On aspirin and statin.

## 2023-12-09 NOTE — Assessment & Plan Note (Signed)
Check c-Met. °

## 2023-12-09 NOTE — Progress Notes (Signed)
 Chief Complaint:  Todd Spears is a 78 y.o. male who presents today for his annual comprehensive physical exam.    Assessment/Plan:  Chronic Problems Addressed Today: Hyperlipidemia On pravastatin  10 mg and 20 mg on alternating days.  Will check lipids today.  Carotid arterial disease (HCC) On aspirin  and statin.  Hyperglycemia Checking to see.  CKD (chronic kidney disease) stage 2, GFR 60-89 ml/min Check c-Met.  Allergic rhinitis Overall stable on Claritin , Singulair , and Astelin .  Eustachian tube dysfunction Tympanostomy tube in place in right ear.  Continue management per ENT.  Essential hypertension Initially elevated. 158/64 on recheck.  His home readings are typically well-controlled and he was reasonably well-controlled at his most recent visit with cardiology.  We did discuss increasing dose of metoprolol  today however he like to hold off on this for now.  He has been on ACE/ARB and spironolactone  in the past but could not tolerate due to issues with hyponatremia.  Will continue his current regimen metoprolol  succinate 50 mg daily, hydralazine  100 mg 3 times daily, and amlodipine  10 mg daily.  Will follow-up with us  in a few weeks via MyChart we can adjust if needed elevated at home.  Preventative Healthcare: Check labs.  He can get Tdap at the pharmacy.  Up-to-date on other vaccines and screenings.  Patient Counseling(The following topics were reviewed and/or handout was given):  -Nutrition: Stressed importance of moderation in sodium/caffeine intake, saturated fat and cholesterol, caloric balance, sufficient intake of fresh fruits, vegetables, and fiber.  -Stressed the importance of regular exercise.   -Substance Abuse: Discussed cessation/primary prevention of tobacco, alcohol, or other drug use; driving or other dangerous activities under the influence; availability of treatment for abuse.   -Injury prevention: Discussed safety belts, safety helmets, smoke detector,  smoking near bedding or upholstery.   -Sexuality: Discussed sexually transmitted diseases, partner selection, use of condoms, avoidance of unintended pregnancy and contraceptive alternatives.   -Dental health: Discussed importance of regular tooth brushing, flossing, and dental visits.  -Health maintenance and immunizations reviewed. Please refer to Health maintenance section.  Return to care in 1 year for next preventative visit.     Subjective:  HPI:  Patient here today for annual physical.  See A/P for status of chronic conditions.  Doing well today without any acute concerns.  Since our last visit he did follow with ENT and had tympanostomy tube placed in right ear.  He did note that this improved his hearing significantly on the right side though still has no hearing on the left.  Lifestyle Diet: Balanced.  Trying to avoid greasy and fatty foods.  Try to get more fruits and vegetables. Exercise: Sedentary     12/09/2023    8:43 AM  Depression screen PHQ 2/9  Decreased Interest 0  Down, Depressed, Hopeless 0  PHQ - 2 Score 0    Health Maintenance Due  Topic Date Due   DTaP/Tdap/Td (4 - Td or Tdap) 03/05/2023     ROS: Per HPI, otherwise a complete review of systems was negative.   PMH:  The following were reviewed and entered/updated in epic: Past Medical History:  Diagnosis Date   Allergic rhinitis    Anemia    Asthma    CAD (coronary artery disease)    a. s/p PTCA/stenting of the mid LAD and mid RCA 04/06/12   Carotid bruit    r   Colon polyp    Diverticulosis    ED (erectile dysfunction)    Fatty liver  GERD (gastroesophageal reflux disease)    Gout    Hyperlipidemia    Hypertension    Obesity    Osteoarthritis    Renal insufficiency    Restless leg syndrome    Patient Active Problem List   Diagnosis Date Noted   Eustachian tube dysfunction 12/09/2023   Hyperglycemia 04/13/2020   Pain due to onychomycosis of toenails of both feet 11/18/2018    Porokeratosis 11/18/2018   Gout 06/18/2018   Osteoarthritis 06/18/2018   Carotid arterial disease (HCC) 08/10/2015   Aortic stenosis, mild 08/10/2015   CKD (chronic kidney disease) stage 2, GFR 60-89 ml/min 07/03/2015   CAD s/p PCI 2013    Hyperlipidemia 11/27/2006   Essential hypertension 11/27/2006   Allergic rhinitis 11/27/2006   GERD 11/27/2006   Past Surgical History:  Procedure Laterality Date   BACK SURGERY  2020   COLONOSCOPY  04/23/2016   Dr.[place]   INGUINAL HERNIA REPAIR Right 02/27/2016   Procedure: HERNIA REPAIR INGUINAL ADULT;  Surgeon: Donnice Bury, MD;  Location: [facility];  Service: General;  Laterality: Right;   INSERTION OF MESH Right 02/27/2016   Procedure: INSERTION OF MESH--IN AND OUT CATHETERIZATION PERFORMED AT END OF PROCEDURE TO DRAIN URINE.;  Surgeon: Donnice Bury, MD;  Location: [facility];  Service: General;  Laterality: Right;   LYMPH NODE BIOPSY     right side of neck 1999 - benign   PERCUTANEOUS CORONARY STENT INTERVENTION (PCI-S) N/A 04/06/2012   Procedure: PERCUTANEOUS CORONARY STENT INTERVENTION (PCI-S);  Surgeon: Peter M Swaz[place]and, MD; Promus DES for 90% mid LAD and 95% mid RCA disease     UPPER GASTROINTESTINAL ENDOSCOPY  04/23/2016   Dr.[place]    Family History  Problem Relation Age of Onset   Hypertension Mother    Stroke Mother        Several    Stomach cancer Mother    Glaucoma Mother    Coronary artery disease Father        in his 29s   Heart disease Father    Heart attack Father    Hypertension Paternal Grandmother    Hypertension Paternal Grandfather    Colon cancer Neg Hx    Rectal cancer Neg Hx     Medications- reviewed and updated Current Outpatient Medications  Medication Sig Dispense Refill   albuterol  (VENTOLIN  HFA) 108 (90 Base) MCG/ACT inhaler Inhale 2 puffs into the lungs every 6 (six) hours as needed for wheezing or shortness of breath. 8.5 each 3   amLODipine  (NORVASC ) 10  MG tablet TAKE 1 TABLET BY MOUTH EVERY DAY 90 tablet 2   aspirin  81 MG chewable tablet Chew 81 mg by mouth daily.     Azelastine  HCl 137 MCG/SPRAY SOLN INSTILL 2 SPRAYS INTO BOTH NOSTRILS 2 TIMES DAILY AS DIRECTED 90 mL 1   cetirizine (ZYRTEC) 10 MG tablet Take 10 mg by mouth every morning.     Cyanocobalamin  (VITAMIN B 12 PO) Take 1 tablet by mouth daily.      diclofenac  sodium (VOLTAREN ) 1 % GEL Apply 2 g topically 4 (four) times daily. 100 g 3   esomeprazole  (NEXIUM ) 40 MG capsule TAKE 1 CAPSULE BY MOUTH DAILY BEFORE A MEAL 90 capsule 2   ezetimibe  (ZETIA ) 10 MG tablet TAKE 1 TABLET BY MOUTH EVERY DAY 90 tablet 3   FeFum-FePoly-FA-B Cmp-C-Biot (INTEGRA PLUS ) CAPS TAKE 1 CAPSULE BY MOUTH DAILY 90 capsule 3   fluticasone  (FLONASE ) 50 MCG/ACT nasal spray Place 2 sprays into both  nostrils 2 (two) times daily. 16 g 6   hydrALAZINE  (APRESOLINE ) 50 MG tablet TAKE 2 TABLETS BY MOUTH 3 TIMES A DAY 540 tablet 3   loratadine  (CLARITIN ) 10 MG tablet Take 10 mg by mouth at bedtime.      LUMIGAN 0.01 % SOLN Place 1 drop into the left eye at bedtime.     metoprolol  succinate (TOPROL -XL) 50 MG 24 hr tablet TAKE 1 TABLET EVERY DAY WITH OR IMMEDIATELY FOLLOWING A MEAL 90 tablet 2   montelukast  (SINGULAIR ) 10 MG tablet TAKE 1 TABLET BY MOUTH EVERYDAY AT BEDTIME 90 tablet 3   Multiple Vitamin (MULTIVITAMIN WITH MINERALS) TABS tablet Take 1 tablet by mouth daily.     nitroGLYCERIN  (NITROSTAT ) 0.4 MG SL tablet Place 1 tablet (0.4 mg total) under the tongue every 5 (five) minutes as needed for chest pain (up to 3 doses. not within 24hrs of Viagra ). 25 tablet 6   pravastatin  (PRAVACHOL ) 10 MG tablet TAKE 1 TABLET EVERY OTHER DAY, ALTERNATING WITH 2 TABLETS. 135 tablet 1   traMADol  (ULTRAM ) 50 MG tablet Take 1 tablet (50 mg total) by mouth every 12 (twelve) hours. 60 tablet 5   No current facility-administered medications for this visit.    Allergies-reviewed and updated Allergies  Allergen Reactions    Prednisone Shortness Of Breath and Swelling   Rosuvastatin  Other (See Comments)   Crestor  [Rosuvastatin  Calcium ] Other (See Comments)    myalgia   Gabapentin  Other (See Comments)   Lyrica  [Pregabalin ]     swelling    Social History   Socioeconomic History   Marital status: Married    Spouse name: Not on file   Number of children: 1   Years of education: Not on file   Highest education level: 12th grade  Occupational History    Employer: [employer]  Tobacco Use   Smoking status: Former    Current packs/day: 0.00    Types: Cigarettes    Quit date: 06/04/1975    Years since quitting: 48.5   Smokeless tobacco: Never  Vaping Use   Vaping status: Never Used  Substance and Sexual Activity   Alcohol use: No    Alcohol/week: 0.0 standard drinks of alcohol   Drug use: No   Sexual activity: Not Currently  Other Topics Concern   Not on file  Social History Narrative   Works as a Solicitor    Married   One son ( lives in [place])    Regular exercise- no   Social Drivers of Corporate investment banker Strain: Low Risk  (12/08/2023)   Overall Financial Resource Strain (CARDIA)    Difficulty of Paying Living Expenses: Not hard at all  Food Insecurity: No Food Insecurity (12/08/2023)   Hunger Vital Sign    Worried About Running Out of Food in the Last Year: Never true    Ran Out of Food in the Last Year: Never true  Transportation Needs: No Transportation Needs (12/08/2023)   PRAPARE - Administrator, Civil Service (Medical): No    Lack of Transportation (Non-Medical): No  Physical Activity: Insufficiently Active (12/08/2023)   Exercise Vital Sign    Days of Exercise per Week: 5 days    Minutes of Exercise per Session: 20 min  Stress: No Stress Concern Present (12/08/2023)   [employer] of Occupational Health - Occupational Stress Questionnaire    Feeling of Stress: Not at all  Social Connections: Socially Integrated (12/08/2023)   Social  Connection and  Isolation Panel    Frequency of Communication with Friends and Family: Three times a week    Frequency of Social Gatherings with Friends and Family: Once a week    Attends Religious Services: More than 4 times per year    Active Member of [employer] or Organizations: Yes    Attends Engineer, structural: More than 4 times per year    Marital Status: Married        Objective:  Physical Exam: BP (!) 158/64   Pulse 66   Temp 97.7 F (36.5 C) (Temporal)   Ht 5' 7 (1.702 m)   Wt 247 lb (112 kg)   SpO2 98%   BMI 38.69 kg/m   Body mass index is 38.69 kg/m. Wt Readings from Last 3 Encounters:  12/09/23 247 lb (112 kg)  09/12/23 249 lb (112.9 kg)  07/04/23 250 lb (113.4 kg)   Gen: NAD, resting comfortably HEENT: TMs normal bilaterally. OP clear. No thyromegaly noted.  Tympanostomy tube noted in right TM. CV: RRR with no murmurs appreciated Pulm: NWOB, CTAB with no crackles, wheezes, or rhonchi GI: Normal bowel sounds present. Soft, Nontender, Nondistended. MSK: no edema, cyanosis, or clubbing noted Skin: warm, dry Neuro: CN2-12 grossly intact. Strength 5/5 in upper and lower extremities. Reflexes symmetric and intact bilaterally.  Psych: Normal affect and thought content     Osceola Holian M. Kennyth, MD 12/09/2023 9:11 AM

## 2023-12-11 ENCOUNTER — Ambulatory Visit: Payer: Self-pay | Admitting: Family Medicine

## 2023-12-11 NOTE — Progress Notes (Signed)
 His A1c is elevated into the diabetic range.  He may benefit from starting a medication to help with the sugar and also help him with weight loss.  Recommend he schedule an appointment here to discuss potential treatment options.  Regardless, he should continue to work on diet and exercise and I would like to see him back in 3 months to recheck his A1c.  All of his other labs are stable.  We can recheck everything else in a year.

## 2023-12-16 ENCOUNTER — Ambulatory Visit (INDEPENDENT_AMBULATORY_CARE_PROVIDER_SITE_OTHER): Admitting: Family Medicine

## 2023-12-16 VITALS — BP 150/63 | HR 63 | Temp 97.7°F | Ht 67.0 in | Wt 246.8 lb

## 2023-12-16 DIAGNOSIS — I10 Essential (primary) hypertension: Secondary | ICD-10-CM

## 2023-12-16 DIAGNOSIS — R739 Hyperglycemia, unspecified: Secondary | ICD-10-CM | POA: Diagnosis not present

## 2023-12-16 NOTE — Patient Instructions (Addendum)
 It was very nice to see you today!  We need to work on reducing your A1c.  Please continue to work on diet and exercise.  Will see you back in 3 months.  Please let us  know if you would like to try a medication to help with A1c reduction.  Return in about 3 months (around 03/17/2024) for Follow Up.   Take care, Dr Kennyth  PLEASE NOTE:  If you had any lab tests, please let us  know if you have not heard back within a few days. You may see your results on mychart before we have a chance to review them but we will give you a call once they are reviewed by us .   If we ordered any referrals today, please let us  know if you have not heard from their office within the next week.   If you had any urgent prescriptions sent in today, please check with the pharmacy within an hour of our visit to make sure the prescription was transmitted appropriately.   Please try these tips to maintain a healthy lifestyle:  Eat at least 3 REAL meals and 1-2 snacks per day.  Aim for no more than 5 hours between eating.  If you eat breakfast, please do so within one hour of getting up.   Each meal should contain half fruits/vegetables, one quarter protein, and one quarter carbs (no bigger than a computer mouse)  Cut down on sweet beverages. This includes juice, soda, and sweet tea.   Drink at least 1 glass of water with each meal and aim for at least 8 glasses per day  Exercise at least 150 minutes every week.

## 2023-12-16 NOTE — Assessment & Plan Note (Signed)
 Mildly elevated today.  Better than last time he was here last week.  Home readings have been at goal.  He will continue current regimen with metoprolol  succinate 50 mg daily, hydralazine  100 mg 3 times daily, amlodipine  10 mg daily.

## 2023-12-16 NOTE — Progress Notes (Signed)
   Todd Spears is a 78 y.o. male who presents today for an office visit.  Assessment/Plan:  Chronic Problems Addressed Today: Hyperglycemia Had a lengthy discussion regarding his most recent A1c of 6.5.  He does admit to a few dietary indiscretions including cookies and sweets as well as starchy carbs.  We did discuss starting medications such as GLP agonist or metformin however he would like to hold off on this for now.  Patient would like to work on diet and exercise before starting medication.  I believe this is a reasonable plan.  He will come back in 3 months to recheck A1c.  Essential hypertension Mildly elevated today.  Better than last time he was here last week.  Home readings have been at goal.  He will continue current regimen with metoprolol  succinate 50 mg daily, hydralazine  100 mg 3 times daily, amlodipine  10 mg daily.     Subjective:  HPI:  Patient here today for follow-up.  I saw him a week ago for annual physical.  Labs at that time were notable for A1c of 6.5. He does admit to eating more bread and cookies.        Objective:  Physical Exam: BP (!) 150/63   Pulse 63   Temp 97.7 F (36.5 C) (Temporal)   Ht 5' 7 (1.702 m)   Wt 246 lb 12.8 oz (111.9 kg)   SpO2 98%   BMI 38.65 kg/m   Gen: No acute distress, resting comfortably Neuro: Grossly normal, moves all extremities Psych: Normal affect and thought content      Lorane Cousar M. Kennyth, MD 12/16/2023 8:46 AM

## 2023-12-16 NOTE — Assessment & Plan Note (Signed)
 Had a lengthy discussion regarding his most recent A1c of 6.5.  He does admit to a few dietary indiscretions including cookies and sweets as well as starchy carbs.  We did discuss starting medications such as GLP agonist or metformin however he would like to hold off on this for now.  Patient would like to work on diet and exercise before starting medication.  I believe this is a reasonable plan.  He will come back in 3 months to recheck A1c.

## 2023-12-30 ENCOUNTER — Ambulatory Visit: Admitting: Podiatry

## 2024-01-06 ENCOUNTER — Ambulatory Visit: Admitting: Podiatry

## 2024-01-06 ENCOUNTER — Encounter: Payer: Self-pay | Admitting: Podiatry

## 2024-01-06 DIAGNOSIS — B351 Tinea unguium: Secondary | ICD-10-CM

## 2024-01-06 DIAGNOSIS — M79674 Pain in right toe(s): Secondary | ICD-10-CM | POA: Diagnosis not present

## 2024-01-06 DIAGNOSIS — N182 Chronic kidney disease, stage 2 (mild): Secondary | ICD-10-CM | POA: Diagnosis not present

## 2024-01-06 DIAGNOSIS — M79675 Pain in left toe(s): Secondary | ICD-10-CM | POA: Diagnosis not present

## 2024-01-06 DIAGNOSIS — Q828 Other specified congenital malformations of skin: Secondary | ICD-10-CM

## 2024-01-06 NOTE — Progress Notes (Signed)
 This patient returns to my office for at risk foot care.  This patient requires this care by a professional since this patient will be at risk due to having chronic kidney disease.  Patient has painful callus on the outside bottom of both feet.  This patient is unable to cut nails himself since the patient cannot reach his nails.These nails are painful walking and wearing shoes. Patient has painful callus on the outside ball both feet.  Patient is unable to self treat.   This patient presents for at risk foot care today.  General Appearance  Alert, conversant and in no acute stress.  Vascular  Dorsalis pedis and posterior tibial  pulses are palpable  bilaterally.  Capillary return is within normal limits  bilaterally. Temperature is within normal limits  bilaterally.  Neurologic  Senn-Weinstein monofilament wire test within normal limits  bilaterally. Muscle power within normal limits bilaterally.  Nails Thick disfigured discolored nails with subungual debris  from hallux to fifth toes bilaterally. No evidence of bacterial infection or drainage bilaterally.  Orthopedic  No limitations of motion  feet .  No crepitus or effusions noted.  No bony pathology or digital deformities noted.  Skin  normotropic skin with no porokeratosis noted bilaterally.  No signs of infections or ulcers noted.    Onychomycosis  Pain in right toes  Pain in left foot   Consent was obtained for treatment procedures.   Mechanical debridement of nails 1-5  bilaterally performed with a nail nipper.  Filed with dremel without incident. No infection or ulcer.  Debride callus with a  dremel tool..   Return office visit     3 months                 Told patient to return for periodic foot care and evaluation due to potential at risk complications.   Ruffin Cotton DPM

## 2024-01-20 ENCOUNTER — Other Ambulatory Visit: Payer: Self-pay | Admitting: Family Medicine

## 2024-01-25 ENCOUNTER — Other Ambulatory Visit: Payer: Self-pay | Admitting: Family Medicine

## 2024-02-14 ENCOUNTER — Other Ambulatory Visit: Payer: Self-pay | Admitting: Family Medicine

## 2024-02-26 DIAGNOSIS — H401131 Primary open-angle glaucoma, bilateral, mild stage: Secondary | ICD-10-CM | POA: Diagnosis not present

## 2024-03-09 ENCOUNTER — Telehealth: Payer: Self-pay | Admitting: Cardiology

## 2024-03-09 ENCOUNTER — Ambulatory Visit: Payer: Self-pay | Admitting: Cardiology

## 2024-03-09 ENCOUNTER — Ambulatory Visit: Attending: Physician Assistant | Admitting: Physician Assistant

## 2024-03-09 ENCOUNTER — Encounter: Payer: Self-pay | Admitting: Physician Assistant

## 2024-03-09 ENCOUNTER — Ambulatory Visit (HOSPITAL_COMMUNITY)
Admission: RE | Admit: 2024-03-09 | Discharge: 2024-03-09 | Disposition: A | Discharge status: Home / Self Care | Payer: Medicare HMO | Source: Ambulatory Visit | Attending: Cardiology | Admitting: Cardiology

## 2024-03-09 VITALS — BP 180/88 | HR 74 | Ht 67.0 in | Wt 244.8 lb

## 2024-03-09 DIAGNOSIS — I6523 Occlusion and stenosis of bilateral carotid arteries: Secondary | ICD-10-CM | POA: Diagnosis not present

## 2024-03-09 DIAGNOSIS — I10 Essential (primary) hypertension: Secondary | ICD-10-CM | POA: Diagnosis not present

## 2024-03-09 DIAGNOSIS — I350 Nonrheumatic aortic (valve) stenosis: Secondary | ICD-10-CM | POA: Insufficient documentation

## 2024-03-09 DIAGNOSIS — E7800 Pure hypercholesterolemia, unspecified: Secondary | ICD-10-CM | POA: Insufficient documentation

## 2024-03-09 DIAGNOSIS — I2510 Atherosclerotic heart disease of native coronary artery without angina pectoris: Secondary | ICD-10-CM | POA: Diagnosis not present

## 2024-03-09 DIAGNOSIS — I779 Disorder of arteries and arterioles, unspecified: Secondary | ICD-10-CM | POA: Insufficient documentation

## 2024-03-09 DIAGNOSIS — R079 Chest pain, unspecified: Secondary | ICD-10-CM

## 2024-03-09 DIAGNOSIS — R0609 Other forms of dyspnea: Secondary | ICD-10-CM | POA: Diagnosis not present

## 2024-03-09 MED ORDER — LOSARTAN POTASSIUM 25 MG PO TABS: 25.0000 mg | ORAL_TABLET | Freq: Every day | ORAL | 3 refills | Status: DC

## 2024-03-09 NOTE — Addendum Note (Signed)
 Addended by: MANDA BOTTCHER B on: 03/09/2024 02:24 PM   Modules accepted: Orders

## 2024-03-09 NOTE — H&P (View-Only) (Signed)
 Cardiology Office Note:  .   Date:  03/09/2024  ID:  Alm Todd Spears, DOB Sep 10, 1945, MRN [id number] PCP: Kennyth Worth HERO, MD  [facility] Providers Cardiologist:  Peter Swaziland, MD    History of Present Illness: .   Todd Spears is a 78 y.o. male  with history of  CAD s/p 2 vessel PCI with Promus DES for 90% mid LAD and 95% mid RCA disease on 04/06/2012. His EF is normal at 55%. At that time he really had no significant anginal symptoms but had an abnormal stress test.  Myoview  in Nov. 2014 showed a fixed inferior defect without ischemia. Last Myoview  in Feb. 2017 was unchanged. Other issues include CKD stage 3, HTN, obesity, gout,,OA, ED, carotid bruit and asthma.    He was admitted in January 2017 with tremors and hyponatremia. Chlorthalidone  was discontinued.   He had carotid dopplers showing 1-39% RICA and 60-79% LICA stenoses. Echo done in March 2017 showed normal LV function and mild AS unchanged from 2013. He has been followed in our lipid clinic for hypercholesterolemia with prior intolerance to statins. He is now on pravastatin  and Zetia .   Echo 04/2023 normal LVEF but mod to severe AS.  Patient walked into our office today requesting an appt because of SOB and not feeling well. He had carotids done earlier. He says he feels winded and wonders if he has an URI. For 4 days when he exerts himself he gets short of breath. Inhaler helped some.  No chest pain. Almost feels raw inside like when you have the flu. BP high here but 142/95 this am. Hasn't had his afternoon hydralazine  yet. 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     ROS:    Studies Reviewed: SABRA    EKG Interpretation Date/Time:  Tuesday March 09 2024 13:46:34 EDT Ventricular Rate:  74 PR Interval:  220 QRS Duration:  102 QT Interval:  384 QTC Calculation: 426 R Axis:   25  Text Interpretation: Sinus rhythm with 1st degree A-V block Possible Inferior infarct (cited on or before  09-Mar-2024) ST & T wave abnormality, consider lateral ischemia Confirmed by Parthenia Klinefelter [phone]) on 03/09/2024 1:49:03 PM    Prior CV Studies:    Echo 04/2023 IMPRESSIONS     1. Left ventricular ejection fraction, by estimation, is 55 to 60%. Left  ventricular ejection fraction by 3D volume is 56 %. The left ventricle has  normal function. The left ventricle has no regional wall motion  abnormalities. There is mild asymmetric  left ventricular hypertrophy of the septal segment. Indeterminate  diastolic filling due to E-A fusion.   2. Right ventricular systolic function is normal. The right ventricular  size is dilated.   3. Left atrial size was mildly dilated.   4. The mitral valve is degenerative. Mild mitral valve regurgitation. No  evidence of mitral stenosis.   5. Calcified valve with DVI 0.25 and decrease stroke volume index. The  aortic valve is calcified. Aortic valve regurgitation is not visualized.  Moderate to severe aortic valve stenosis. Aortic valve area, by VTI  measures 0.79 cm. Aortic valve mean  gradient measures 33.0 mmHg. Aortic valve Vmax measures 3.74 m/s.   6. The inferior vena cava is normal in size with greater than 50%  respiratory variability, suggesting right atrial pressure of 3 mmHg.   Comparison(s): Unable to view 2017 study.   Carotid dopplers 03/2023 Summary:  Right Carotid: Velocities in  the right ICA are consistent with a 40-59%                 stenosis. Non-hemodynamically significant plaque <50% noted  in                the CCA. The ECA appears >50% stenosed.   Left Carotid: Velocities in the left ICA are consistent with a 40-59%  stenosis.               Non-hemodynamically significant plaque <50% noted in the  CCA.   Vertebrals: Left vertebral artery demonstrates antegrade flow. Right  vertebral              artery demonstrates high resistant flow.  Subclavians: Normal flow hemodynamics were seen in bilateral subclavian                arteries.   *See table(s) above for measurements and observations.  Suggest follow up study in 12 months.    Electronically signed by Deatrice Cage MD on 03/13/2023 at 4:17:28 PM.   Risk Assessment/Calculations:     HYPERTENSION CONTROL Vitals:   03/09/24 1306 03/09/24 1345  BP: (!) 190/74 (!) 180/88    The patient's blood pressure is elevated above target today.  In order to address the patient's elevated BP: Blood pressure will be monitored at home to determine if medication changes need to be made.; A new medication was prescribed today.; The blood pressure is usually elevated in clinic.  Blood pressures monitored at home have been optimal.; Follow up with general cardiology has been recommended.          Physical Exam:   VS:  BP (!) 180/88   Pulse 74   Ht 5' 7 (1.702 m)   Wt 244 lb 12.8 oz (111 kg)   SpO2 97%   BMI 38.34 kg/m    Orhtostatics: No data found. Wt Readings from Last 3 Encounters:  03/09/24 244 lb 12.8 oz (111 kg)  12/16/23 246 lb 12.8 oz (111.9 kg)  12/09/23 247 lb (112 kg)    GEN: Well nourished, well developed in no acute distress NECK: No JVD; No carotid bruits CARDIAC:  RRR, 4/6 systolic murmur LSB/RSB RESPIRATORY:  Clear to auscultation without rales, wheezing or rhonchi  ABDOMEN: Soft, non-tender, non-distended EXTREMITIES:  chronic edema; No deformity   ASSESSMENT AND PLAN: .    DOE-came on in the past 4 days. No chest pain but never had chest pain with prior stents. Will order lexiscan . Already scheduled for echo next month but will try to move it up as AS may have progressed. Keep f/u with Dr.Jordan 03/23/24  Addendum: NST high risk with ischemia in apical to basal anterior, anterolateral and apex partially reversible. Also fixed defect in apical and basal inferior locations. EF dropped to 37%. Dr. Swaziland recommends LHC. Discussed results with patient and he's agreeable to proceed, will arrange. I have reviewed the risks, indications, and  alternatives to angioplasty and stenting with the patient. Risks include but are not limited to bleeding, infection, vascular injury, stroke, myocardial infection, arrhythmia, kidney injury, radiation-related injury in the case of prolonged fluoroscopy use, emergency cardiac surgery, and death. The patient understands the risks of serious complication is low (<1%) and patient agrees to proceed.    CAD DES mLAD and mRCA 2013, low risk NST 2017  Moderate to Severe AS on echo 04/2023-try to move up as it may be contributing to his DOE  HTN-BP running high here and at home.  Will add losartan 25 mg once daily. Check bmet today and in 2 weeks when he returns. 2 gm sodium diet, weight loss.  HLD-on pravachol  and zetia . LDL 75 12/2023  Bilateral carotid stenosis 40-59% 03/2023-repeat done today.     Informed Consent   Shared Decision Making/Informed Consent The risks [chest pain, shortness of breath, cardiac arrhythmias, dizziness, blood pressure fluctuations, myocardial infarction, stroke/transient ischemic attack, nausea, vomiting, allergic reaction, radiation exposure, metallic taste sensation and life-threatening complications (estimated to be 1 in 10,000)], benefits (risk stratification, diagnosing coronary artery disease, treatment guidance) and alternatives of a nuclear stress test were discussed in detail with Mr. Collister and he agrees to proceed.     Dispo: f/u as scheduled  Signed, Olivia Pavy, [name]

## 2024-03-09 NOTE — Progress Notes (Addendum)
 Cardiology Office Note:  .   Date:  03/09/2024  ID:  Todd Spears, DOB Sep 10, 1945, MRN [id number] PCP: Kennyth Worth HERO, MD  [facility] Providers Cardiologist:  Peter Swaziland, MD    History of Present Illness: .   Todd Spears is a 78 y.o. male  with history of  CAD s/p 2 vessel PCI with Promus DES for 90% mid LAD and 95% mid RCA disease on 04/06/2012. His EF is normal at 55%. At that time he really had no significant anginal symptoms but had an abnormal stress test.  Myoview  in Nov. 2014 showed a fixed inferior defect without ischemia. Last Myoview  in Feb. 2017 was unchanged. Other issues include CKD stage 3, HTN, obesity, gout,,OA, ED, carotid bruit and asthma.    He was admitted in January 2017 with tremors and hyponatremia. Chlorthalidone  was discontinued.   He had carotid dopplers showing 1-39% RICA and 60-79% LICA stenoses. Echo done in March 2017 showed normal LV function and mild AS unchanged from 2013. He has been followed in our lipid clinic for hypercholesterolemia with prior intolerance to statins. He is now on pravastatin  and Zetia .   Echo 04/2023 normal LVEF but mod to severe AS.  Patient walked into our office today requesting an appt because of SOB and not feeling well. He had carotids done earlier. He says he feels winded and wonders if he has an URI. For 4 days when he exerts himself he gets short of breath. Inhaler helped some.  No chest pain. Almost feels raw inside like when you have the flu. BP high here but 142/95 this am. Hasn't had his afternoon hydralazine  yet. 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     ROS:    Studies Reviewed: SABRA    EKG Interpretation Date/Time:  Tuesday March 09 2024 13:46:34 EDT Ventricular Rate:  74 PR Interval:  220 QRS Duration:  102 QT Interval:  384 QTC Calculation: 426 R Axis:   25  Text Interpretation: Sinus rhythm with 1st degree A-V block Possible Inferior infarct (cited on or before  09-Mar-2024) ST & T wave abnormality, consider lateral ischemia Confirmed by Parthenia Klinefelter [phone]) on 03/09/2024 1:49:03 PM    Prior CV Studies:    Echo 04/2023 IMPRESSIONS     1. Left ventricular ejection fraction, by estimation, is 55 to 60%. Left  ventricular ejection fraction by 3D volume is 56 %. The left ventricle has  normal function. The left ventricle has no regional wall motion  abnormalities. There is mild asymmetric  left ventricular hypertrophy of the septal segment. Indeterminate  diastolic filling due to E-A fusion.   2. Right ventricular systolic function is normal. The right ventricular  size is dilated.   3. Left atrial size was mildly dilated.   4. The mitral valve is degenerative. Mild mitral valve regurgitation. No  evidence of mitral stenosis.   5. Calcified valve with DVI 0.25 and decrease stroke volume index. The  aortic valve is calcified. Aortic valve regurgitation is not visualized.  Moderate to severe aortic valve stenosis. Aortic valve area, by VTI  measures 0.79 cm. Aortic valve mean  gradient measures 33.0 mmHg. Aortic valve Vmax measures 3.74 m/s.   6. The inferior vena cava is normal in size with greater than 50%  respiratory variability, suggesting right atrial pressure of 3 mmHg.   Comparison(s): Unable to view 2017 study.   Carotid dopplers 03/2023 Summary:  Right Carotid: Velocities in  the right ICA are consistent with a 40-59%                 stenosis. Non-hemodynamically significant plaque <50% noted  in                the CCA. The ECA appears >50% stenosed.   Left Carotid: Velocities in the left ICA are consistent with a 40-59%  stenosis.               Non-hemodynamically significant plaque <50% noted in the  CCA.   Vertebrals: Left vertebral artery demonstrates antegrade flow. Right  vertebral              artery demonstrates high resistant flow.  Subclavians: Normal flow hemodynamics were seen in bilateral subclavian                arteries.   *See table(s) above for measurements and observations.  Suggest follow up study in 12 months.    Electronically signed by Deatrice Cage MD on 03/13/2023 at 4:17:28 PM.   Risk Assessment/Calculations:     HYPERTENSION CONTROL Vitals:   03/09/24 1306 03/09/24 1345  BP: (!) 190/74 (!) 180/88    The patient's blood pressure is elevated above target today.  In order to address the patient's elevated BP: Blood pressure will be monitored at home to determine if medication changes need to be made.; A new medication was prescribed today.; The blood pressure is usually elevated in clinic.  Blood pressures monitored at home have been optimal.; Follow up with general cardiology has been recommended.          Physical Exam:   VS:  BP (!) 180/88   Pulse 74   Ht 5' 7 (1.702 m)   Wt 244 lb 12.8 oz (111 kg)   SpO2 97%   BMI 38.34 kg/m    Orhtostatics: No data found. Wt Readings from Last 3 Encounters:  03/09/24 244 lb 12.8 oz (111 kg)  12/16/23 246 lb 12.8 oz (111.9 kg)  12/09/23 247 lb (112 kg)    GEN: Well nourished, well developed in no acute distress NECK: No JVD; No carotid bruits CARDIAC:  RRR, 4/6 systolic murmur LSB/RSB RESPIRATORY:  Clear to auscultation without rales, wheezing or rhonchi  ABDOMEN: Soft, non-tender, non-distended EXTREMITIES:  chronic edema; No deformity   ASSESSMENT AND PLAN: .    DOE-came on in the past 4 days. No chest pain but never had chest pain with prior stents. Will order lexiscan . Already scheduled for echo next month but will try to move it up as AS may have progressed. Keep f/u with Dr.Jordan 03/23/24  Addendum: NST high risk with ischemia in apical to basal anterior, anterolateral and apex partially reversible. Also fixed defect in apical and basal inferior locations. EF dropped to 37%. Dr. Swaziland recommends LHC. Discussed results with patient and he's agreeable to proceed, will arrange. I have reviewed the risks, indications, and  alternatives to angioplasty and stenting with the patient. Risks include but are not limited to bleeding, infection, vascular injury, stroke, myocardial infection, arrhythmia, kidney injury, radiation-related injury in the case of prolonged fluoroscopy use, emergency cardiac surgery, and death. The patient understands the risks of serious complication is low (<1%) and patient agrees to proceed.    CAD DES mLAD and mRCA 2013, low risk NST 2017  Moderate to Severe AS on echo 04/2023-try to move up as it may be contributing to his DOE  HTN-BP running high here and at home.  Will add losartan 25 mg once daily. Check bmet today and in 2 weeks when he returns. 2 gm sodium diet, weight loss.  HLD-on pravachol  and zetia . LDL 75 12/2023  Bilateral carotid stenosis 40-59% 03/2023-repeat done today.     Informed Consent   Shared Decision Making/Informed Consent The risks [chest pain, shortness of breath, cardiac arrhythmias, dizziness, blood pressure fluctuations, myocardial infarction, stroke/transient ischemic attack, nausea, vomiting, allergic reaction, radiation exposure, metallic taste sensation and life-threatening complications (estimated to be 1 in 10,000)], benefits (risk stratification, diagnosing coronary artery disease, treatment guidance) and alternatives of a nuclear stress test were discussed in detail with Mr. Collister and he agrees to proceed.     Dispo: f/u as scheduled  Signed, Olivia Pavy, [name]

## 2024-03-09 NOTE — Telephone Encounter (Signed)
 Patient received an appt for today at 1:15  with EMERSON Pavy PA

## 2024-03-09 NOTE — Telephone Encounter (Signed)
 Patient has walked into Zone A on the 5th floor (Todd Spears's patient) requesting to be evaluated as he says he has not been feeling well experiencing shortness of breath.  Thank you.

## 2024-03-09 NOTE — Patient Instructions (Addendum)
 Medication Instructions:  Your physician has recommended you make the following change in your medication:  START: Losartan 25 mg twice daily *If you need a refill on your cardiac medications before your next appointment, please call your pharmacy*  Lab Work: TODAY: BMET In 2 weeks: BMET If you have labs (blood work) drawn today and your tests are completely normal, you will receive your results only by: MyChart Message (if you have MyChart) OR A paper copy in the mail If you have any lab test that is abnormal or we need to change your treatment, we will call you to review the results.  Testing/Procedures: Your provider has requested that you have a lexiscan  myoview .  The test will take approximately 3 to 4 hours to complete; you may bring reading material.  If someone comes with you to your appointment, they will need to remain in the main lobby due to limited space in the testing area. **If you are pregnant or breastfeeding, please notify the nuclear lab prior to your appointment**  How to prepare for your Myocardial Perfusion Test: Do not eat or drink 3 hours prior to your test, except you may have water. Do not consume products containing caffeine (regular or decaffeinated) 12 hours prior to your test. (ex: coffee, chocolate, sodas, tea). Do bring a list of your current medications with you.  If not listed below, you may take your medications as normal. Do wear comfortable clothes (no dresses or overalls) and walking shoes, tennis shoes preferred (No heels or open toe shoes are allowed). Do NOT wear cologne, perfume, aftershave, or lotions (deodorant is allowed). If these instructions are not followed, your test will have to be rescheduled.   Follow-Up: At [facility], you and your health needs are our priority.  As part of our continuing mission to provide you with exceptional heart care, our providers are all part of one team.  This team includes your primary Cardiologist  (physician) and Advanced Practice Providers or APPs (Physician Assistants and Nurse Practitioners) who all work together to provide you with the care you need, when you need it.  Your next appointment:    10/21 at 9am   Provider:   Peter Swaziland, MD

## 2024-03-09 NOTE — Addendum Note (Signed)
 Addended by: PARTHENIA OLIVIA HERO on: 03/09/2024 02:16 PM   Modules accepted: Orders

## 2024-03-10 ENCOUNTER — Ambulatory Visit: Payer: Self-pay

## 2024-03-10 LAB — BASIC METABOLIC PANEL WITH GFR
BUN/Creatinine Ratio: 13 (ref 10–24)
BUN: 13 mg/dL (ref 8–27)
CO2: 22 mmol/L (ref 20–29)
Calcium: 9.4 mg/dL (ref 8.6–10.2)
Chloride: 101 mmol/L (ref 96–106)
Creatinine, Ser: 1.02 mg/dL (ref 0.76–1.27)
Glucose: 125 mg/dL — ABNORMAL HIGH (ref 70–99)
Potassium: 4.3 mmol/L (ref 3.5–5.2)
Sodium: 138 mmol/L (ref 134–144)
eGFR: 75 mL/min/1.73 (ref >59)

## 2024-03-12 ENCOUNTER — Other Ambulatory Visit: Payer: Self-pay | Admitting: Physician Assistant

## 2024-03-12 DIAGNOSIS — R0609 Other forms of dyspnea: Secondary | ICD-10-CM

## 2024-03-15 ENCOUNTER — Ambulatory Visit (HOSPITAL_COMMUNITY)
Admission: RE | Admit: 2024-03-15 | Discharge: 2024-03-15 | Disposition: A | Discharge status: Home / Self Care | Source: Ambulatory Visit | Attending: Internal Medicine | Admitting: Internal Medicine

## 2024-03-15 DIAGNOSIS — R0609 Other forms of dyspnea: Secondary | ICD-10-CM | POA: Insufficient documentation

## 2024-03-15 LAB — MYOCARDIAL PERFUSION IMAGING
LV dias vol: 237 mL (ref 62–150)
LV sys vol: 149 mL (ref 4.2–5.8)
Nuc Stress EF: 37 %
Peak HR: 103 {beats}/min
Rest HR: 90 {beats}/min
Rest Nuclear Isotope Dose: 10.2 mCi
SDS: 7
SRS: 12
SSS: 18
ST Depression (mm): 0 mm
Stress Nuclear Isotope Dose: 31.9 mCi
TID: 1.03

## 2024-03-15 MED ORDER — TECHNETIUM TC 99M TETROFOSMIN IV KIT
31.9000 | PACK | Freq: Once | INTRAVENOUS | Status: AC | PRN
Start: 2024-03-15 — End: 2024-03-15
  Administered 2024-03-15: 31.9 via INTRAVENOUS

## 2024-03-15 MED ORDER — REGADENOSON 0.4 MG/5ML IV SOLN
0.4000 mg | Freq: Once | INTRAVENOUS | Status: AC
  Administered 2024-03-15: 0.4 mg via INTRAVENOUS

## 2024-03-15 MED ORDER — TECHNETIUM TC 99M TETROFOSMIN IV KIT
10.2000 | PACK | Freq: Once | INTRAVENOUS | Status: AC | PRN
  Administered 2024-03-15: 10.2 via INTRAVENOUS

## 2024-03-15 MED ORDER — REGADENOSON 0.4 MG/5ML IV SOLN
INTRAVENOUS | Status: AC
  Filled 2024-03-15: qty 5

## 2024-03-16 ENCOUNTER — Other Ambulatory Visit: Payer: Self-pay

## 2024-03-16 ENCOUNTER — Other Ambulatory Visit: Payer: Self-pay | Admitting: *Deleted

## 2024-03-16 ENCOUNTER — Telehealth: Payer: Self-pay | Admitting: *Deleted

## 2024-03-16 DIAGNOSIS — R9439 Abnormal result of other cardiovascular function study: Secondary | ICD-10-CM

## 2024-03-16 DIAGNOSIS — R0609 Other forms of dyspnea: Secondary | ICD-10-CM

## 2024-03-16 DIAGNOSIS — I779 Disorder of arteries and arterioles, unspecified: Secondary | ICD-10-CM

## 2024-03-16 NOTE — Telephone Encounter (Addendum)
  [facility] DEPT OF [place] [address] A DEPT OF THE [place] [address] [place] [address] Dept: [phone] Loc: [phone]  DAXEN LANUM  03/16/2024  You are scheduled for a Cardiac Catheterization on FRIDAY, March 19, 2024  with Dr. DEBBY CLAY.  1. Please arrive at the [facility] (Main Entrance A) at [facility]: [address] [place], [place] [address] at 8:30 AM (This time is 2 hour(s) before your procedure to ensure your preparation).   Free valet parking service is available. You will check in at ADMITTING. The support person will be asked to wait in the waiting room.  It is OK to have someone drop you off and come back when you are ready to be discharged.    Special note: Every effort is made to have your procedure done on time. Please understand that emergencies sometimes delay scheduled procedures.  2. Diet: Nothing to eat after midnight.   3. Hydration: You need to be well hydrated before your procedure. On March 19, 2024 you may drink approved liquids (see below) until 2 hours before the procedure, with 16 oz of water as your last intake.   List of approved liquids water, clear juice, clear tea, black coffee, fruit juices, non-citric and without pulp, carbonated beverages, Gatorade, Kool -Aid, plain Jello-O and plain ice popsicles.  4. Labs: You will need to have blood drawn on Tuesday, March 16, 2024 at [facility] (1st Floor), [address] [place], [place] [address]. You do not need to be fasting.  5. Medication instructions in preparation for your procedure:   Contrast Allergy: No  Stop taking, Cozaar (Losartan) THURSDAY, March 18, 2024. (TAKE YOUR LAST DOSE ON WEDNESDAY March 17, 2024).   On the morning of your procedure, take your Aspirin  81 mg and any morning medicines NOT listed above.  You may use sips of  water.  6. Plan to go home the same day, you will only stay overnight if medically necessary. 7. Bring a current list of your medications and current insurance cards. 8. You MUST have a responsible person to drive you home. 9. Someone MUST be with you the first 24 hours after you arrive home or your discharge will be delayed. 10. Please wear clothes that are easy to get on and off and wear slip-on shoes.  Thank you for allowing us  to care for you!   -- [facility]

## 2024-03-16 NOTE — Addendum Note (Signed)
 Addended by: PARTHENIA OLIVIA HERO on: 03/16/2024 08:54 AM   Modules accepted: Orders

## 2024-03-16 NOTE — Telephone Encounter (Signed)
 Patient says he is returning a call to review results.

## 2024-03-17 ENCOUNTER — Ambulatory Visit: Payer: Self-pay | Admitting: Physician Assistant

## 2024-03-17 LAB — COMPREHENSIVE METABOLIC PANEL WITH GFR
ALT: 29 IU/L (ref 0–44)
AST: 28 IU/L (ref 0–40)
Albumin: 4 g/dL (ref 3.8–4.8)
Alkaline Phosphatase: 59 IU/L (ref 47–123)
BUN/Creatinine Ratio: 18 (ref 10–24)
BUN: 20 mg/dL (ref 8–27)
Bilirubin Total: 0.4 mg/dL (ref 0.0–1.2)
CO2: 20 mmol/L (ref 20–29)
Calcium: 9 mg/dL (ref 8.6–10.2)
Chloride: 99 mmol/L (ref 96–106)
Creatinine, Ser: 1.12 mg/dL (ref 0.76–1.27)
Globulin, Total: 2.7 g/dL (ref 1.5–4.5)
Glucose: 113 mg/dL — AB (ref 70–99)
Potassium: 4.5 mmol/L (ref 3.5–5.2)
Sodium: 135 mmol/L (ref 134–144)
Total Protein: 6.7 g/dL (ref 6.0–8.5)
eGFR: 67 mL/min/1.73 (ref >59)

## 2024-03-17 LAB — CBC
Hematocrit: 36.8 % — ABNORMAL LOW (ref 37.5–51.0)
Hemoglobin: 12.2 g/dL — ABNORMAL LOW (ref 13.0–17.7)
MCH: 31.9 pg (ref 26.6–33.0)
MCHC: 33.2 g/dL (ref 31.5–35.7)
MCV: 96 fL (ref 79–97)
Platelets: 257 x10E3/uL (ref 150–450)
RBC: 3.82 x10E6/uL — ABNORMAL LOW (ref 4.14–5.80)
RDW: 11.9 % (ref 11.6–15.4)
WBC: 9 x10E3/uL (ref 3.4–10.8)

## 2024-03-18 ENCOUNTER — Telehealth: Payer: Self-pay | Admitting: *Deleted

## 2024-03-18 NOTE — Telephone Encounter (Signed)
 Cardiac Catheterization scheduled at [facility] for: Friday March 19, 2024 10:30 AM Arrival time [facility] Main Entrance A at: 8:30 AM  Diet: -Nothing to eat after midnight.  Hydration: -May drink clear liquids until 2 hours before the procedure.  Approved liquids: Water, clear tea, black coffee, fruit juices-non-citric and without pulp,Gatorade, plain Jello/popsicles.   -Please drink 16 oz of water 2 hours before procedure.  Medication instructions: -Usual morning medications can be taken including aspirin  81 mg.  Plan to go home the same day, you will only stay overnight if medically necessary.  You must have responsible adult to drive you home.  Someone must be with you the first 24 hours after you arrive home.  Reviewed procedure instructions with patient.

## 2024-03-19 ENCOUNTER — Encounter (HOSPITAL_COMMUNITY): Admission: RE | Disposition: A | Payer: Self-pay | Source: Home / Self Care | Attending: Cardiology

## 2024-03-19 ENCOUNTER — Ambulatory Visit: Admitting: Family Medicine

## 2024-03-19 ENCOUNTER — Other Ambulatory Visit: Payer: Self-pay

## 2024-03-19 ENCOUNTER — Ambulatory Visit (HOSPITAL_COMMUNITY)
Admission: RE | Admit: 2024-03-19 | Discharge: 2024-03-19 | Disposition: A | Discharge status: Home / Self Care | Attending: Cardiology | Admitting: Cardiology

## 2024-03-19 DIAGNOSIS — N183 Chronic kidney disease, stage 3 unspecified: Secondary | ICD-10-CM | POA: Diagnosis not present

## 2024-03-19 DIAGNOSIS — I1310 Hypertensive heart and chronic kidney disease without heart failure, with stage 1 through stage 4 chronic kidney disease, or unspecified chronic kidney disease: Secondary | ICD-10-CM | POA: Insufficient documentation

## 2024-03-19 DIAGNOSIS — J45909 Unspecified asthma, uncomplicated: Secondary | ICD-10-CM | POA: Insufficient documentation

## 2024-03-19 DIAGNOSIS — R9430 Abnormal result of cardiovascular function study, unspecified: Secondary | ICD-10-CM | POA: Insufficient documentation

## 2024-03-19 DIAGNOSIS — Z955 Presence of coronary angioplasty implant and graft: Secondary | ICD-10-CM | POA: Insufficient documentation

## 2024-03-19 DIAGNOSIS — M1990 Unspecified osteoarthritis, unspecified site: Secondary | ICD-10-CM | POA: Insufficient documentation

## 2024-03-19 DIAGNOSIS — Z79899 Other long term (current) drug therapy: Secondary | ICD-10-CM | POA: Insufficient documentation

## 2024-03-19 DIAGNOSIS — R0609 Other forms of dyspnea: Secondary | ICD-10-CM | POA: Insufficient documentation

## 2024-03-19 DIAGNOSIS — M1000 Idiopathic gout, unspecified site: Secondary | ICD-10-CM | POA: Insufficient documentation

## 2024-03-19 DIAGNOSIS — I2510 Atherosclerotic heart disease of native coronary artery without angina pectoris: Secondary | ICD-10-CM | POA: Diagnosis not present

## 2024-03-19 DIAGNOSIS — I2582 Chronic total occlusion of coronary artery: Secondary | ICD-10-CM | POA: Insufficient documentation

## 2024-03-19 DIAGNOSIS — E669 Obesity, unspecified: Secondary | ICD-10-CM | POA: Diagnosis not present

## 2024-03-19 DIAGNOSIS — I6523 Occlusion and stenosis of bilateral carotid arteries: Secondary | ICD-10-CM | POA: Diagnosis not present

## 2024-03-19 DIAGNOSIS — Z6838 Body mass index (BMI) 38.0-38.9, adult: Secondary | ICD-10-CM | POA: Insufficient documentation

## 2024-03-19 DIAGNOSIS — E7800 Pure hypercholesterolemia, unspecified: Secondary | ICD-10-CM | POA: Insufficient documentation

## 2024-03-19 HISTORY — PX: LEFT HEART CATH AND CORONARY ANGIOGRAPHY: CATH118249

## 2024-03-19 LAB — POCT ACTIVATED CLOTTING TIME: Activated Clotting Time: 170 s

## 2024-03-19 SURGERY — LEFT HEART CATH AND CORONARY ANGIOGRAPHY
Anesthesia: LOCAL

## 2024-03-19 MED ORDER — LIDOCAINE HCL (PF) 1 % IJ SOLN
INTRAMUSCULAR | Status: AC
  Filled 2024-03-19: qty 30

## 2024-03-19 MED ORDER — VERAPAMIL HCL 2.5 MG/ML IV SOLN
INTRAVENOUS | Status: DC | PRN
  Administered 2024-03-19: 10 mL via INTRA_ARTERIAL

## 2024-03-19 MED ORDER — HEPARIN SODIUM (PORCINE) 1000 UNIT/ML IJ SOLN
INTRAMUSCULAR | Status: DC | PRN
  Administered 2024-03-19: 5500 [IU] via INTRAVENOUS
  Administered 2024-03-19: 3000 [IU] via INTRAVENOUS

## 2024-03-19 MED ORDER — SODIUM CHLORIDE 0.9% FLUSH: 3.0000 mL | Freq: Two times a day (BID) | INTRAVENOUS | Status: DC

## 2024-03-19 MED ORDER — VERAPAMIL HCL 2.5 MG/ML IV SOLN
INTRAVENOUS | Status: AC
  Filled 2024-03-19: qty 2

## 2024-03-19 MED ORDER — IOHEXOL 350 MG/ML SOLN
INTRAVENOUS | Status: DC | PRN
  Administered 2024-03-19: 210 mL

## 2024-03-19 MED ORDER — HEPARIN (PORCINE) IN NACL 1000-0.9 UT/500ML-% IV SOLN
INTRAVENOUS | Status: DC | PRN
  Administered 2024-03-19 (×4): 500 mL

## 2024-03-19 MED ORDER — FENTANYL CITRATE (PF) 100 MCG/2ML IJ SOLN
INTRAMUSCULAR | Status: DC | PRN
  Administered 2024-03-19 (×3): 25 ug via INTRAVENOUS

## 2024-03-19 MED ORDER — HEPARIN SODIUM (PORCINE) 1000 UNIT/ML IJ SOLN
INTRAMUSCULAR | Status: AC
  Filled 2024-03-19: qty 10

## 2024-03-19 MED ORDER — SODIUM CHLORIDE 0.9 % IV SOLN: 250.0000 mL | INTRAVENOUS | Status: DC | PRN

## 2024-03-19 MED ORDER — SODIUM CHLORIDE 0.9% FLUSH: 3.0000 mL | INTRAVENOUS | Status: DC | PRN

## 2024-03-19 MED ORDER — MIDAZOLAM HCL 2 MG/2ML IJ SOLN
INTRAMUSCULAR | Status: AC
  Filled 2024-03-19: qty 2

## 2024-03-19 MED ORDER — LIDOCAINE HCL (PF) 1 % IJ SOLN
INTRAMUSCULAR | Status: DC | PRN
  Administered 2024-03-19 (×2): 2 mL via INTRADERMAL

## 2024-03-19 MED ORDER — FENTANYL CITRATE (PF) 100 MCG/2ML IJ SOLN
INTRAMUSCULAR | Status: AC
  Filled 2024-03-19: qty 2

## 2024-03-19 MED ORDER — MIDAZOLAM HCL (PF) 2 MG/2ML IJ SOLN
INTRAMUSCULAR | Status: DC | PRN
  Administered 2024-03-19 (×2): 1 mg via INTRAVENOUS

## 2024-03-19 MED ORDER — ASPIRIN 81 MG PO CHEW: 81.0000 mg | CHEWABLE_TABLET | ORAL | Status: DC

## 2024-03-19 MED ORDER — SODIUM CHLORIDE 0.9 % IV SOLN
INTRAVENOUS | Status: AC | PRN
  Administered 2024-03-19: 50 mL/h via INTRAVENOUS

## 2024-03-19 SURGICAL SUPPLY — 20 items
CATH 5FR JL3.5 JR4 ANG PIG MP (CATHETERS) IMPLANT
CATH INFINITI 5 FR 3DRC (CATHETERS) IMPLANT
CATH INFINITI 5F JL4 125CM (CATHETERS) IMPLANT
CATH INFINITI AMBI 5FR TG (CATHETERS) IMPLANT
CATH LAUNCHER 5F AL1 (CATHETERS) IMPLANT
CATH LAUNCHER 5F EBU3.5 (CATHETERS) IMPLANT
CATH VISTA GUIDE 6FR JL4 ECOPK (CATHETERS) IMPLANT
CATH VISTA GUIDE 6FR XBLD 3.5 (CATHETERS) IMPLANT
DEVICE RAD COMP TR BAND LRG (VASCULAR PRODUCTS) IMPLANT
GLIDESHEATH SLEND SS 6F .021 (SHEATH) IMPLANT
GUIDEWIRE INQWIRE 1.5J.035X260 (WIRE) IMPLANT
KIT SYRINGE INJ CVI SPIKEX1 (MISCELLANEOUS) IMPLANT
PACK CARDIAC CATHETERIZATION (CUSTOM PROCEDURE TRAY) ×1 IMPLANT
SHEATH GUIDING CAROTID 6FRX90 (SHEATH) IMPLANT
SHEATH HYDRO PINNACLE 6FR 45 (SHEATH) IMPLANT
SHEATH INTRO PINNACLE 6F 25CM (SHEATH) IMPLANT
SHEATH INTROD PINNACLE 5F 25CM (SHEATH) IMPLANT
SHEATH PINNACLE 5F 10CM (SHEATH) IMPLANT
SHEATH PROBE COVER 6X72 (BAG) IMPLANT
WIRE MICRO SET SILHO 5FR 7 (SHEATH) IMPLANT

## 2024-03-19 NOTE — Interval H&P Note (Signed)
 History and Physical Interval Note:  03/19/2024 11:49 AM  Todd Spears  has presented today for surgery, with the diagnosis of abnormal stress test.  The various methods of treatment have been discussed with the patient and family. After consideration of risks, benefits and other options for treatment, the patient has consented to  Procedure(s): LEFT HEART CATH AND CORONARY ANGIOGRAPHY (N/A)  PERCUTANEOUS CORONARY INTERVENTION   as a surgical intervention.  The patient's history has been reviewed, patient examined, no change in status, stable for surgery.  I have reviewed the patient's chart and labs.  Questions were answered to the patient's satisfaction.    Cath Lab Visit (complete for each Cath Lab visit)  Clinical Evaluation Leading to the Procedure:   ACS: No.  Non-ACS:    Anginal Classification: CCS II  Anti-ischemic medical therapy: Maximal Therapy (2 or more classes of medications)  Non-Invasive Test Results: High-risk stress test findings: cardiac mortality >3%/year  Prior CABG: No previous CABG     Todd Spears

## 2024-03-19 NOTE — Progress Notes (Signed)
 Up and walked and tolerated well; right groin stable, no bleeding or hematoma

## 2024-03-19 NOTE — Progress Notes (Unsigned)
 Todd Spears Date of Birth: 09/07/1945 Medical Record #[id number]  History of Present Illness: Todd Spears is seen back today for follow up CAD. He is s/p 2 vessel PCI with Promus DES for 90% mid LAD and 95% mid RCA disease on 04/06/2012. His EF is normal at 55%. At that time he really had no significant anginal symptoms but had an abnormal stress test.  Myoview  in Nov. 2014 showed a fixed inferior defect without ischemia. Last Myoview  in Feb. 2017 was unchanged. Other issues include CKD stage 3, HTN, obesity, gout,,OA, ED, carotid bruit and asthma.   He was admitted in January 2017 with tremors and hyponatremia. Chlorthalidone  was discontinued.   He had carotid dopplers showing 1-39% RICA and 60-79% LICA stenoses. Echo done in March 2017 showed normal LV function and mild AS unchanged from 2013. He has been followed in our lipid clinic for hypercholesterolemia with prior intolerance to statins. He is now on pravastatin  and Zetia .   He was seen last month with symptoms of dyspnea on exertion. A PET CT was performed demonstrating anterior ischemia and infarction in the inferior wall. EF 37%. This was felt to be a high risk scan. Her underwent cardiac cath on 10/17 showing three vessel and left main CAD. AV gradient of 24 mm Hg. EDP elevated at 24 mm Hg. Seen today to review and discuss management options.   His heart cath was technically very challenging due to significant tortuosity of vessels including aorta. Following procedure he has large area of bruising in right groin. Soreness. Notes breathing has been worse.    Allergies as of 03/23/2024       Reactions   Prednisone Shortness Of Breath, Swelling   Rosuvastatin  Other (See Comments)   Crestor  [rosuvastatin  Calcium ] Other (See Comments)   myalgia   Gabapentin  Other (See Comments)   Lyrica  [pregabalin ]    swelling        Medication List        Accurate as of March 23, 2024  9:15 AM. If you have any questions, ask your nurse or  doctor.          albuterol  108 (90 Base) MCG/ACT inhaler Commonly known as: VENTOLIN  HFA Inhale 2 puffs into the lungs every 6 (six) hours as needed for wheezing or shortness of breath.   amLODipine  10 MG tablet Commonly known as: NORVASC  TAKE 1 TABLET BY MOUTH EVERY DAY   aspirin  81 MG chewable tablet Chew 81 mg by mouth daily.   Azelastine  HCl 137 MCG/SPRAY Soln INSTILL 2 SPRAYS INTO BOTH NOSTRILS 2 TIMES DAILY AS DIRECTED What changed: See the new instructions.   cetirizine 10 MG tablet Commonly known as: ZYRTEC Take 10 mg by mouth every morning.   esomeprazole  40 MG capsule Commonly known as: NEXIUM  TAKE 1 CAPSULE BY MOUTH DAILY BEFORE A MEAL   ezetimibe  10 MG tablet Commonly known as: ZETIA  TAKE 1 TABLET BY MOUTH EVERY DAY   fluticasone  50 MCG/ACT nasal spray Commonly known as: FLONASE  Place 2 sprays into both nostrils 2 (two) times daily. What changed:  when to take this reasons to take this   furosemide  20 MG tablet Commonly known as: LASIX  Take 1 tablet (20 mg total) by mouth daily. Started by: Si Jachim [place]   hydrALAZINE  50 MG tablet Commonly known as: APRESOLINE  TAKE 2 TABLETS BY MOUTH 3 TIMES A DAY   Integra Plus  Caps TAKE 1 CAPSULE BY MOUTH DAILY   losartan 25 MG tablet Commonly known as: COZAAR Take 1  tablet (25 mg total) by mouth daily. What changed: when to take this   Lumigan 0.01 % Soln Generic drug: bimatoprost Place 1 drop into both eyes at bedtime.   metoprolol  succinate 50 MG 24 hr tablet Commonly known as: TOPROL -XL TAKE 1 TABLET EVERY DAY WITH OR IMMEDIATELY FOLLOWING A MEAL   montelukast  10 MG tablet Commonly known as: SINGULAIR  TAKE 1 TABLET BY MOUTH EVERYDAY AT BEDTIME   multivitamin with minerals Tabs tablet Take 1 tablet by mouth daily. Adult 50+   nitroGLYCERIN  0.4 MG SL tablet Commonly known as: Nitrostat  Place 1 tablet (0.4 mg total) under the tongue every 5 (five) minutes as needed for chest pain (up to 3 doses.  not within 24hrs of Viagra ).   potassium chloride  SA 20 MEQ tablet Commonly known as: KLOR-CON  M Take 1 tablet (20 mEq total) by mouth daily. Started by: Inis Borneman [place]   pravastatin  10 MG tablet Commonly known as: PRAVACHOL  TAKE 1 TABLET EVERY OTHER DAY, ALTERNATING WITH 2 TABLETS.   traMADol  50 MG tablet Commonly known as: ULTRAM  TAKE 1 TABLET BY MOUTH EVERY 12 HOURS. What changed:  when to take this reasons to take this   VITAMIN B 12 PO Take 1 tablet by mouth daily.         Allergies  Allergen Reactions   Prednisone Shortness Of Breath and Swelling   Rosuvastatin  Other (See Comments)   Crestor  [Rosuvastatin  Calcium ] Other (See Comments)    myalgia   Gabapentin  Other (See Comments)   Lyrica  [Pregabalin ]     swelling    Past Medical History:  Diagnosis Date   Allergic rhinitis    Anemia    Asthma    CAD (coronary artery disease)    a. s/p PTCA/stenting of the mid LAD and mid RCA 04/06/12   Carotid bruit    r   Colon polyp    Diverticulosis    ED (erectile dysfunction)    Fatty liver    GERD (gastroesophageal reflux disease)    Gout    Hyperlipidemia    Hypertension    Obesity    Osteoarthritis    Renal insufficiency    Restless leg syndrome     Past Surgical History:  Procedure Laterality Date   BACK SURGERY  2020   COLONOSCOPY  04/23/2016   Dr.Perry   INGUINAL HERNIA REPAIR Right 02/27/2016   Procedure: HERNIA REPAIR INGUINAL ADULT;  Surgeon: Donnice Bury, MD;  Location: [facility];  Service: General;  Laterality: Right;   INSERTION OF MESH Right 02/27/2016   Procedure: INSERTION OF MESH--IN AND OUT CATHETERIZATION PERFORMED AT END OF PROCEDURE TO DRAIN URINE.;  Surgeon: Donnice Bury, MD;  Location: [facility];  Service: General;  Laterality: Right;   LEFT HEART CATH AND CORONARY ANGIOGRAPHY N/A 03/19/2024   Procedure: LEFT HEART CATH AND CORONARY ANGIOGRAPHY;  Surgeon: Anner Todd ORN, MD;   Location: [facility]IVE CV LAB;  Service: Cardiovascular;  Laterality: N/A;   LYMPH NODE BIOPSY     right side of neck 1999 - benign   PERCUTANEOUS CORONARY STENT INTERVENTION (PCI-S) N/A 04/06/2012   Procedure: PERCUTANEOUS CORONARY STENT INTERVENTION (PCI-S);  Surgeon: Ruvim Risko M [place], MD; Promus DES for 90% mid LAD and 95% mid RCA disease     UPPER GASTROINTESTINAL ENDOSCOPY  04/23/2016   Dr.Perry    Social History   Tobacco Use  Smoking Status Former   Current packs/day: 0.00   Types: Cigarettes   Quit date: 06/04/1975  Years since quitting: 48.8  Smokeless Tobacco Never    Social History   Substance and Sexual Activity  Alcohol Use No   Alcohol/week: 0.0 standard drinks of alcohol    Family History  Problem Relation Age of Onset   Hypertension Mother    Stroke Mother        Several    Stomach cancer Mother    Glaucoma Mother    Coronary artery disease Father        in his 45s   Heart disease Father    Heart attack Father    Hypertension Paternal Grandmother    Hypertension Paternal Grandfather    Colon cancer Neg Hx    Rectal cancer Neg Hx     Review of Systems: The review of systems is per the HPI.  All other systems were reviewed and are negative.  Physical Exam: BP 122/66 (BP Location: Left Arm, Patient Position: Sitting, Cuff Size: Large)   Pulse 85   Resp 16   Ht 5' 7 (1.702 m)   Wt 246 lb 12.8 oz (111.9 kg)   SpO2 98%   BMI 38.65 kg/m  GENERAL:  Well appearing, obese WM  HEENT:  PERRL, EOMI, sclera are clear. Oropharynx is clear. NECK:  No jugular venous distention, carotid upstroke brisk and symmetric, no bruits, no thyromegaly or adenopathy LUNGS:  Clear to auscultation bilaterally CHEST:  Unremarkable HEART:  RRR,  PMI not displaced or sustained,S1 and S2 within normal limits, no S3, no S4: no clicks, no rubs, harsh gr 2/6 systolic murmur RUSB radiating to carotids.  ABD:  Soft, nontender. BS +, no masses or bruits. No hepatomegaly, no  splenomegaly EXT:  large right groin bruising. Feels soft but extends into lower abdomen, scrotum and upper thigh. SKIN:  Warm and dry.  No rashes NEURO:  Alert and oriented x 3. Cranial nerves II through XII intact. PSYCH:  Cognitively intact      LABORATORY DATA: Lab Results  Component Value Date   WBC 9.0 03/16/2024   HGB 12.2 (L) 03/16/2024   HCT 36.8 (L) 03/16/2024   PLT 257 03/16/2024   GLUCOSE 113 (H) 03/16/2024   CHOL 159 12/09/2023   TRIG 172.0 (H) 12/09/2023   HDL 49.50 12/09/2023   LDLDIRECT 71.0 06/27/2022   LDLCALC 75 12/09/2023   ALT 29 03/16/2024   AST 28 03/16/2024   NA 135 03/16/2024   K 4.5 03/16/2024   CL 99 03/16/2024   CREATININE 1.12 03/16/2024   BUN 20 03/16/2024   CO2 20 03/16/2024   TSH 2.73 12/09/2023   PSA 0.64 06/27/2022   INR 0.9 08/07/2021   HGBA1C 6.5 12/09/2023             Lexiscan  myoview  07/28/15: Study Highlights     There was no ST segment deviation noted during stress. The left ventricular ejection fraction is normal (55-65%). Nuclear stress EF: 56%. Defect 1: There is a medium defect of moderate severity present in the basal inferoseptal, basal inferior, mid inferior and apical inferior location. In setting of normal LVF, this is consistent with diaphragmatic attenuation artifact. No ischemia noted. This is a low risk study.      Addendum by Wilbert JONELLE Bihari, MD on Fri Jul 28, 2015  2:03 PM    There was no ST segment deviation noted during stress. The left ventricular ejection fraction is normal (55-65%). Nuclear stress EF: 56%. Defect 1: There is a medium defect of moderate severity present in the basal inferoseptal,  basal inferior, mid inferior and apical inferior location. In setting of normal LVF, this is consistent with diaphragmatic attenuation artifact. No ischemia noted. This is a low risk study.     Echo: 08/25/15: Study Conclusions   - Left ventricle: The cavity size was normal. Wall thickness was   normal.  Systolic function was normal. The estimated ejection   fraction was in the range of 60% to 65%. Wall motion was normal;   there were no regional wall motion abnormalities. - Aortic valve: Moderately calcified annulus. Moderately calcified   leaflets. There was mild stenosis. - Mitral valve: There was mild regurgitation. - Left atrium: The atrium was mildly dilated  PET CT: Study Highlights Show Result Comparison    Findings are consistent with ischemia. The study is high risk.   No ST deviation was noted.   LV perfusion is abnormal. There is evidence of ischemia. Defect 1: There is a large defect with moderate reduction in uptake present in the apical to basal anterior, anterolateral and apex location(s) that is partially reversible. There is abnormal wall motion in the defect area. Consistent with ischemia. Defect 2: There is a medium defect with moderate reduction in uptake present in the apical to basal inferior location(s) that is fixed. There is abnormal wall motion in the defect area. Consistent with infarction.   Left ventricular function is abnormal. Nuclear stress EF: 37%. The left ventricular ejection fraction is moderately decreased (30-44%). End diastolic cavity size is severely enlarged. End systolic cavity size is severely enlarged.   CT images were obtained for attenuation correction and were examined for the presence of coronary calcium  when appropriate.   Coronary calcium  assessment not performed due to prior revascularization.   Electronically signed by Jerel Balding, MD     LEFT HEART CATH AND CORONARY ANGIOGRAPHY   Conclusion      Ost RCA to Prox RCA lesion is 100% stenosed.  Prox RCA to Mid RCA lesion is 100% stenosed.  The RCA fills fills from left to right collaterals.   Ost LM to Ost LAD lesion is 50% stenosed with 80% stenosed side branch in Ost Cx to Prox Cx.  Ostial ramus lesion is 70% stenosed.   Mid LAD-1 lesion is 55% stenosed. Mid LAD-2 lesion is 50%  stenosed.  Previously placed Mid LAD-3 stent of unknown type is widely patent.   In the absence of any other complications or medical issues, we expect the patient to be ready for discharge from a cath perspective on 03/19/2024.   Plan will be to discussed with Dr. [place] and consider if he would be a potential candidate for CVTS consultation.  Need to reassess his symptoms.  He does have echocardiographic  evidence of moderate to severe AS.  (24 mmHg per cath)   Recommend Aspirin  81mg  daily for moderate CAD.         100% flush occlusion of the RCA with retrograde filling from left-to-right collaterals filling all the way back to the occlusion. Eccentric Left Main Disease, difficult to interpret stenosis, cannot exclude up to 50%.  The LAD gives off a major diagonal branch just after the diagonal branch there is a focal 50% stenosis followed by another 50% stenosis. After large septal perforator there is a widely patent stent. The LAD reaches down around the apex and has mild diffuse luminal irregularities.. Bifurcation ostial LCx and RI 70 % RI, 80 % LCx stenosis.  Very torturous aorta iliac, abdominal aortic and aortic arch/innominate artery vessels making it  very difficult to manipulate catheters either from femoral or radial access.  Would not recommend right radial access in the future.  LVEDP 24 mmHg.      RECOMMENDATIONS   In the absence of any other complications or medical issues, we expect the patient to be ready for discharge from a cath perspective on 03/19/2024.   Plan will be to discussed with Dr. [place] and consider if he would be a potential candidate for CVTS consultation.  Need to reassess his symptoms.  He does have echocardiographic  evidence of moderate to severe AS.  (24 mmHg per cath)   Recommend Aspirin  81mg  daily for moderate CAD.       Todd Clay, MD  Assessment / Plan:  1. CAD - status post 2 vessel PCI with DES in November 2013. Now with progressive  symptoms of SOB. High risk PET CT scan. Cardiac cath showed 3 vessel obstructive CAD progressed from 2012.  I personally reviewed cath images. I think his left main is worse than reported. I would say it is at least 70%. He also has moderate proximal LAD and ostial LCx disease. The RCA is occluded at the origin.  He is already on optimal medical therapy and is symptomatic. Recommend consideration for surgical revascularization with CABG. Given AS will need valve replacement as well. Repeat Echo pending. ECP was elevated so will start lasix  20 mg daily with potassium to help breathing.   2. HTN - controlled.   3. Hyponatremia improved.   4. CKD stage 2-3a. Last creatinine 1.12 with GFR 67.   5. Hyperlipidemia. Intolerant of statins at higher doses. Continue with  dietary modification and weight loss. Continue pravastatin . Excellent response to addition of Zetia . Last LDL 71.   6. Carotid arterial disease with moderate 40-59% LICA stenosis, 40-59% RICA stenosis. Asymptomatic. Dopplers  this month unchanged.   7. Moderate to severe AS. Gradient 25 mm Hg on cath. Repeat Echo pending.   8. Groin hematoma. Will check US  to make sure there is no PSA

## 2024-03-19 NOTE — Progress Notes (Signed)
 Site: right femoral 37fr 25cm Condition prior to removal:  Level 0 Type of pressure held: manual Time pressure held: 30 Status of patient during pull:  stable Condition of site post pull:  Level 0 Type of dressing applied: gauze, cdi

## 2024-03-21 ENCOUNTER — Encounter (HOSPITAL_COMMUNITY): Payer: Self-pay | Admitting: Cardiology

## 2024-03-23 ENCOUNTER — Ambulatory Visit: Attending: Cardiology | Admitting: Cardiology

## 2024-03-23 ENCOUNTER — Encounter: Payer: Self-pay | Admitting: Cardiology

## 2024-03-23 VITALS — BP 122/66 | HR 85 | Resp 16 | Ht 67.0 in | Wt 246.8 lb

## 2024-03-23 DIAGNOSIS — I10 Essential (primary) hypertension: Secondary | ICD-10-CM | POA: Diagnosis not present

## 2024-03-23 DIAGNOSIS — E7800 Pure hypercholesterolemia, unspecified: Secondary | ICD-10-CM

## 2024-03-23 DIAGNOSIS — T148XXD Other injury of unspecified body region, subsequent encounter: Secondary | ICD-10-CM

## 2024-03-23 DIAGNOSIS — I2510 Atherosclerotic heart disease of native coronary artery without angina pectoris: Secondary | ICD-10-CM | POA: Diagnosis not present

## 2024-03-23 DIAGNOSIS — T148XXA Other injury of unspecified body region, initial encounter: Secondary | ICD-10-CM

## 2024-03-23 MED ORDER — FUROSEMIDE 20 MG PO TABS: 20.0000 mg | ORAL_TABLET | Freq: Every day | ORAL | 3 refills | Status: DC

## 2024-03-23 MED ORDER — POTASSIUM CHLORIDE CRYS ER 20 MEQ PO TBCR: 20.0000 meq | EXTENDED_RELEASE_TABLET | Freq: Every day | ORAL | 3 refills | Status: AC

## 2024-03-23 MED ORDER — LORAZEPAM 0.5 MG PO TABS: ORAL_TABLET | ORAL | 3 refills | Status: AC

## 2024-03-23 NOTE — Patient Instructions (Addendum)
 Medication Instructions:  Start Lasix  20 mg daily in morning  Start Kdur 20 meq daily in morning  Continue all other medications *If you need a refill on your cardiac medications before your next appointment, please call your pharmacy*  Lab Work: None ordered  Testing/Procedures: Venous Doppler of right groin   Lower Extremity Arterial Dopplers  Follow-Up: At [facility], you and your health needs are our priority.  As part of our continuing mission to provide you with exceptional heart care, our providers are all part of one team.  This team includes your primary Cardiologist (physician) and Advanced Practice Providers or APPs (Physician Assistants and Nurse Practitioners) who all work together to provide you with the care you need, when you need it.  Your next appointment:  After Echo    Provider:  Dr.Jordan   Dr.Bartle's office will call with appointment    We recommend signing up for the patient portal called MyChart.  Sign up information is provided on this After Visit Summary.  MyChart is used to connect with patients for Virtual Visits (Telemedicine).  Patients are able to view lab/test results, encounter notes, upcoming appointments, etc.  Non-urgent messages can be sent to your provider as well.   To learn more about what you can do with MyChart, go to [email or web address].

## 2024-03-24 ENCOUNTER — Ambulatory Visit (HOSPITAL_COMMUNITY)

## 2024-03-25 ENCOUNTER — Ambulatory Visit: Payer: Self-pay | Admitting: Cardiology

## 2024-03-25 ENCOUNTER — Ambulatory Visit (HOSPITAL_BASED_OUTPATIENT_CLINIC_OR_DEPARTMENT_OTHER)
Admission: RE | Admit: 2024-03-25 | Discharge: 2024-03-25 | Disposition: A | Discharge status: Home / Self Care | Source: Ambulatory Visit | Attending: Cardiology | Admitting: Cardiology

## 2024-03-25 ENCOUNTER — Other Ambulatory Visit (HOSPITAL_COMMUNITY): Payer: Self-pay | Admitting: Cardiology

## 2024-03-25 ENCOUNTER — Ambulatory Visit (HOSPITAL_COMMUNITY)
Admission: RE | Admit: 2024-03-25 | Discharge: 2024-03-25 | Disposition: A | Discharge status: Home / Self Care | Source: Ambulatory Visit | Attending: Cardiology | Admitting: Cardiology

## 2024-03-25 DIAGNOSIS — I2510 Atherosclerotic heart disease of native coronary artery without angina pectoris: Secondary | ICD-10-CM | POA: Insufficient documentation

## 2024-03-25 DIAGNOSIS — T148XXA Other injury of unspecified body region, initial encounter: Secondary | ICD-10-CM

## 2024-03-25 DIAGNOSIS — I97630 Postprocedural hematoma of a circulatory system organ or structure following a cardiac catheterization: Secondary | ICD-10-CM

## 2024-03-25 DIAGNOSIS — Z01818 Encounter for other preprocedural examination: Secondary | ICD-10-CM | POA: Insufficient documentation

## 2024-03-26 ENCOUNTER — Telehealth: Payer: Self-pay | Admitting: *Deleted

## 2024-03-26 ENCOUNTER — Ambulatory Visit: Admitting: Family Medicine

## 2024-03-26 LAB — VAS US ABI WITH/WO TBI
Left ABI: 1.54
Right ABI: 1.25

## 2024-03-26 NOTE — Telephone Encounter (Signed)
 Copied from CRM [id number]. Topic: Appointments - Scheduling Inquiry for Clinic >> Mar 26, 2024  7:35 AM Harlene ORN wrote: Reason for CRM: Patient cancelled his appointment for 10/24 for his heart issues. Will be seeing his Cardiologist, but wanted to let his PCP know what's going on.  Wants PCP's nurse to call back.   See note  Elora KRAFT

## 2024-03-26 NOTE — Telephone Encounter (Signed)
 Noted. They have been sending us  updated office notes from his visits and we are following along.  Worth HERO. Kennyth, MD 03/26/2024 9:55 AM

## 2024-03-30 DIAGNOSIS — Z0279 Encounter for issue of other medical certificate: Secondary | ICD-10-CM

## 2024-03-31 ENCOUNTER — Telehealth: Payer: Self-pay | Admitting: Cardiology

## 2024-03-31 NOTE — Telephone Encounter (Signed)
 Patient brought in an FMLA form, [employer].  Patient signed the Release of Information and paid the $29 form fee. Form in Dr. Gib box.

## 2024-04-02 ENCOUNTER — Other Ambulatory Visit: Payer: Self-pay | Admitting: Family Medicine

## 2024-04-06 NOTE — Telephone Encounter (Signed)
 FMLA form faxed to [employer] and scanned into chart. Billing notified.

## 2024-04-07 ENCOUNTER — Ambulatory Visit: Admitting: Podiatry

## 2024-04-13 ENCOUNTER — Ambulatory Visit: Payer: Self-pay | Admitting: Cardiology

## 2024-04-13 ENCOUNTER — Ambulatory Visit (HOSPITAL_COMMUNITY)
Admission: RE | Admit: 2024-04-13 | Discharge: 2024-04-13 | Disposition: A | Discharge status: Home / Self Care | Source: Ambulatory Visit | Attending: Cardiovascular Disease | Admitting: Cardiovascular Disease

## 2024-04-13 DIAGNOSIS — I350 Nonrheumatic aortic (valve) stenosis: Secondary | ICD-10-CM | POA: Diagnosis not present

## 2024-04-13 LAB — ECHOCARDIOGRAM COMPLETE
AR max vel: 0.73 cm2
AV Area VTI: 0.7 cm2
AV Area mean vel: 0.74 cm2
AV Mean grad: 26 mmHg
AV Peak grad: 47.3 mmHg
Ao pk vel: 3.44 m/s
Area-P 1/2: 3.97 cm2
S' Lateral: 3.8 cm

## 2024-04-14 ENCOUNTER — Encounter: Payer: Self-pay | Admitting: Surgery

## 2024-04-14 ENCOUNTER — Encounter: Payer: Self-pay | Admitting: *Deleted

## 2024-04-14 ENCOUNTER — Ambulatory Visit: Attending: Surgery | Admitting: Surgery

## 2024-04-14 ENCOUNTER — Other Ambulatory Visit: Payer: Self-pay | Admitting: *Deleted

## 2024-04-14 ENCOUNTER — Other Ambulatory Visit: Payer: Self-pay | Admitting: Surgery

## 2024-04-14 VITALS — BP 115/66 | HR 80 | Resp 20 | Ht 67.0 in | Wt 245.0 lb

## 2024-04-14 DIAGNOSIS — I350 Nonrheumatic aortic (valve) stenosis: Secondary | ICD-10-CM

## 2024-04-14 DIAGNOSIS — I2510 Atherosclerotic heart disease of native coronary artery without angina pectoris: Secondary | ICD-10-CM

## 2024-04-14 NOTE — Progress Notes (Signed)
 [address] [place] [address]             [phone]    Cardiothoracic Surgery Consultation  PCP is Kennyth Worth HERO, MD Referring Provider is Jordan, Peter M, MD  Chief Complaint  Patient presents with   Coronary Artery Disease   Aortic Stenosis    Surgical consult    HPI:  The patient is a 78 year old gentleman with history of hypertension, hyperlipidemia, stage III chronic kidney disease, obesity, and coronary artery disease status post stenting of the mid LAD and mid RCA in 04/2012.  He reportedly had no significant symptoms at that time but an abnormal stress test.  He has been followed by cardiology and presented in September 2025 with shortness of breath on exertion.  A PET/CT showed anterior ischemia with infarction in the inferior wall.  Ejection fraction was 37%.  He underwent cardiac catheterization on 03/19/2024 showing left main and three-vessel coronary disease.  The aortic valve gradient was 24 mmHg with an elevated end-diastolic pressure of 24 mmHg.  A 2D echocardiogram on 04/13/2024 showed a calcified and thickened aortic valve with restricted leaflet mobility.  The mean gradient was 26 mmHg with a peak of 47 mmHg and a valve area by VTI of 0.7 cm suggesting low-flow/low gradient severe aortic stenosis.  Stroke-volume index was low at 26 and dimensionless index was 0.2.  Left ventricular ejection fraction was mildly reduced at 50 to 55%.  There is mild mitral regurgitation.  He is here today with his wife.  He continues to work full-time.  He reports some exertional fatigue and shortness of breath.  He has had no chest discomfort.  He denies any dizziness or syncope.  He has had bilateral lower extremity edema for some time.  He denies orthopnea and PND. Past Medical History:  Diagnosis Date   Allergic rhinitis    Anemia    Asthma    CAD (coronary artery disease)    a. s/p PTCA/stenting of the mid LAD and mid RCA 04/06/12   Carotid bruit     r   Colon polyp    Diverticulosis    ED (erectile dysfunction)    Fatty liver    GERD (gastroesophageal reflux disease)    Gout    Hyperlipidemia    Hypertension    Obesity    Osteoarthritis    Renal insufficiency    Restless leg syndrome     Past Surgical History:  Procedure Laterality Date   BACK SURGERY  2020   COLONOSCOPY  04/23/2016   Dr.Perry   INGUINAL HERNIA REPAIR Right 02/27/2016   Procedure: HERNIA REPAIR INGUINAL ADULT;  Surgeon: Donnice Bury, MD;  Location: [facility];  Service: General;  Laterality: Right;   INSERTION OF MESH Right 02/27/2016   Procedure: INSERTION OF MESH--IN AND OUT CATHETERIZATION PERFORMED AT END OF PROCEDURE TO DRAIN URINE.;  Surgeon: Donnice Bury, MD;  Location: [facility];  Service: General;  Laterality: Right;   LEFT HEART CATH AND CORONARY ANGIOGRAPHY N/A 03/19/2024   Procedure: LEFT HEART CATH AND CORONARY ANGIOGRAPHY;  Surgeon: Anner Todd ORN, MD;  Location: [facility] INVASIVE CV LAB;  Service: Cardiovascular;  Laterality: N/A;   LYMPH NODE BIOPSY     right side of neck 1999 - benign   PERCUTANEOUS CORONARY STENT INTERVENTION (PCI-S) N/A 04/06/2012   Procedure: PERCUTANEOUS CORONARY STENT INTERVENTION (PCI-S);  Surgeon: Peter M Jordan, MD; Promus DES for 90% mid  LAD and 95% mid RCA disease     UPPER GASTROINTESTINAL ENDOSCOPY  04/23/2016   Dr.Perry    Family History  Problem Relation Age of Onset   Hypertension Mother    Stroke Mother        Several    Stomach cancer Mother    Glaucoma Mother    Coronary artery disease Father        in his 7s   Heart disease Father    Heart attack Father    Hypertension Paternal Grandmother    Hypertension Paternal Grandfather    Colon cancer Neg Hx    Rectal cancer Neg Hx     Social History Social History   Tobacco Use   Smoking status: Former    Current packs/day: 0.00    Types: Cigarettes    Quit date: 06/04/1975    Years since quitting:  48.8   Smokeless tobacco: Never  Vaping Use   Vaping status: Never Used  Substance Use Topics   Alcohol use: No    Alcohol/week: 0.0 standard drinks of alcohol   Drug use: No    Current Outpatient Medications  Medication Sig Dispense Refill   albuterol  (VENTOLIN  HFA) 108 (90 Base) MCG/ACT inhaler Inhale 2 puffs into the lungs every 6 (six) hours as needed for wheezing or shortness of breath. 8.5 each 3   amLODipine  (NORVASC ) 10 MG tablet TAKE 1 TABLET BY MOUTH EVERY DAY 90 tablet 1   aspirin  81 MG chewable tablet Chew 81 mg by mouth daily.     Azelastine  HCl 137 MCG/SPRAY SOLN INSTILL 2 SPRAYS INTO BOTH NOSTRILS 2 TIMES DAILY AS DIRECTED (Patient taking differently: Place 1 spray into both nostrils daily as needed (Congestion).) 90 mL 1   cetirizine (ZYRTEC) 10 MG tablet Take 10 mg by mouth every morning.     Cyanocobalamin  (VITAMIN B 12 PO) Take 1 tablet by mouth daily.      esomeprazole  (NEXIUM ) 40 MG capsule TAKE 1 CAPSULE BY MOUTH DAILY BEFORE A MEAL 90 capsule 2   ezetimibe  (ZETIA ) 10 MG tablet TAKE 1 TABLET BY MOUTH EVERY DAY 90 tablet 3   FeFum-FePoly-FA-B Cmp-C-Biot (INTEGRA PLUS ) CAPS TAKE 1 CAPSULE BY MOUTH DAILY 90 capsule 3   fluticasone  (FLONASE ) 50 MCG/ACT nasal spray Place 2 sprays into both nostrils 2 (two) times daily. (Patient taking differently: Place 2 sprays into both nostrils 2 (two) times daily as needed for rhinitis.) 16 g 6   furosemide  (LASIX ) 20 MG tablet Take 1 tablet (20 mg total) by mouth daily. 90 tablet 3   hydrALAZINE  (APRESOLINE ) 50 MG tablet TAKE 2 TABLETS BY MOUTH 3 TIMES A DAY 540 tablet 1   LORazepam  (ATIVAN ) 0.5 MG tablet Take 0.5 mg daily as needed 30 tablet 3   losartan (COZAAR) 25 MG tablet Take 1 tablet (25 mg total) by mouth daily. (Patient taking differently: Take 25 mg by mouth 2 (two) times daily.) 90 tablet 3   LUMIGAN 0.01 % SOLN Place 1 drop into both eyes at bedtime.     metoprolol  succinate (TOPROL -XL) 50 MG 24 hr tablet TAKE 1 TABLET  EVERY DAY WITH OR IMMEDIATELY FOLLOWING A MEAL 90 tablet 2   montelukast  (SINGULAIR ) 10 MG tablet TAKE 1 TABLET BY MOUTH EVERYDAY AT BEDTIME 90 tablet 3   Multiple Vitamin (MULTIVITAMIN WITH MINERALS) TABS tablet Take 1 tablet by mouth daily. Adult 50+     nitroGLYCERIN  (NITROSTAT ) 0.4 MG SL tablet Place 1 tablet (0.4 mg total) under the tongue  every 5 (five) minutes as needed for chest pain (up to 3 doses. not within 24hrs of Viagra ). 25 tablet 6   potassium chloride  SA (KLOR-CON  M) 20 MEQ tablet Take 1 tablet (20 mEq total) by mouth daily. 90 tablet 3   pravastatin  (PRAVACHOL ) 10 MG tablet TAKE 1 TABLET EVERY OTHER DAY, ALTERNATING WITH 2 TABLETS. 135 tablet 1   traMADol  (ULTRAM ) 50 MG tablet TAKE 1 TABLET BY MOUTH EVERY 12 HOURS. (Patient taking differently: Take 50 mg by mouth every 12 (twelve) hours as needed for moderate pain (pain score 4-6) or severe pain (pain score 7-10).) 60 tablet 5   No current facility-administered medications for this visit.    Allergies  Allergen Reactions   Prednisone Shortness Of Breath and Swelling   Rosuvastatin  Other (See Comments)   Crestor  [Rosuvastatin  Calcium ] Other (See Comments)    myalgia   Gabapentin  Other (See Comments)   Lyrica  [Pregabalin ]     swelling    Review of Systems  Constitutional:  Positive for fatigue. Negative for activity change.  HENT:  Negative for dental problem.   Eyes: Negative.   Respiratory:  Positive for shortness of breath.   Cardiovascular:  Positive for leg swelling. Negative for chest pain and palpitations.  Gastrointestinal: Negative.   Endocrine: Negative.   Genitourinary: Negative.   Musculoskeletal:  Positive for arthralgias.  Skin: Negative.   Allergic/Immunologic: Negative.   Neurological:  Negative for dizziness and syncope.  Hematological: Negative.   Psychiatric/Behavioral: Negative.      BP 115/66   Pulse 80   Resp 20   Ht 5' 7 (1.702 m)   Wt 245 lb (111.1 kg)   SpO2 98% Comment: RA  BMI  38.37 kg/m  Physical Exam Constitutional:      Appearance: Normal appearance. He is obese.  HENT:     Head: Normocephalic and atraumatic.     Mouth/Throat:     Mouth: Mucous membranes are moist.     Pharynx: Oropharynx is clear.  Eyes:     Conjunctiva/sclera: Conjunctivae normal.     Pupils: Pupils are equal, round, and reactive to light.  Cardiovascular:     Rate and Rhythm: Normal rate and regular rhythm.     Pulses: Normal pulses.     Heart sounds: Murmur heard.     Comments: 3/6 systolic murmur along the right sternal border.  There is no diastolic murmur. Pulmonary:     Effort: Pulmonary effort is normal.     Breath sounds: Normal breath sounds.  Abdominal:     General: There is no distension.     Tenderness: There is no abdominal tenderness.  Musculoskeletal:        General: Swelling present.  Skin:    General: Skin is warm and dry.  Neurological:     General: No focal deficit present.     Mental Status: He is alert and oriented to person, place, and time.  Psychiatric:        Mood and Affect: Mood normal.        Behavior: Behavior normal.     Diagnostic Tests:    ECHOCARDIOGRAM REPORT       Patient Name:   Avid A Hinkley Date of Exam: 04/13/2024  Medical Rec #:  [id number]      Height:       67.0 in  Accession #:    [id number]     Weight:       246.8 lb  Date of Birth:  05-Sep-1945  BSA:          2.211 m  Patient Age:    78 years       BP:           122/66 mmHg  Patient Gender: M              HR:           69 bpm.  Exam Location:  [address]   Procedure: 2D Echo, Cardiac Doppler and Color Doppler (Both Spectral and  Color            Flow Doppler were utilized during procedure).   Indications:    I35.0 AS    History:        Patient has prior history of Echocardiogram examinations,  most                 recent 04/08/2023. CAD, AS; Risk Factors:Hypertension,                  Dyslipidemia, Obesity and Former Smoker.    Sonographer:    Elsie Bohr RDCS  Referring Phys: [phone] PETER M JORDAN   IMPRESSIONS     1. Left ventricular ejection fraction, by estimation, is 50 to 55%. The  left ventricle has low normal function. The left ventricle has no regional  wall motion abnormalities. Left ventricular diastolic parameters are  consistent with Grade II diastolic  dysfunction (pseudonormalization). Elevated left atrial pressure.   2. Right ventricular systolic function is mildly reduced. The right  ventricular size is mildly enlarged. There is moderately elevated  pulmonary artery systolic pressure.   3. The mitral valve is normal in structure. Mild mitral valve  regurgitation. No evidence of mitral stenosis.   4. Suspect Parodoxical Normal EF Low flow Low gradient Severe Aortic  stenosis. The aortic valve is calcified. There is mild calcification of  the aortic valve. There is mild thickening of the aortic valve. Aortic  valve regurgitation is not visualized.  Suspect Parodoxical Normal EF Low flow Low gradient Severe Aortic  stenosis. Aortic valve area, by VTI measures 0.70 cm. Aortic valve mean  gradient measures 26.0 mmHg. Aortic valve Vmax measures 3.44 m/s.   5. The inferior vena cava is normal in size with <50% respiratory  variability, suggesting right atrial pressure of 8 mmHg.   FINDINGS   Left Ventricle: Left ventricular ejection fraction, by estimation, is 50  to 55%. The left ventricle has low normal function. The left ventricle has  no regional wall motion abnormalities. The left ventricular internal  cavity size was normal in size.  There is no left ventricular hypertrophy. Left ventricular diastolic  parameters are consistent with Grade II diastolic dysfunction  (pseudonormalization). Elevated left atrial pressure.   Right Ventricle: The right ventricular size is mildly enlarged. No  increase in right ventricular wall thickness. Right ventricular systolic  function is mildly reduced. There is moderately  elevated pulmonary artery  systolic pressure. The tricuspid  regurgitant velocity is 3.21 m/s, and with an assumed right atrial  pressure of 8 mmHg, the estimated right ventricular systolic pressure is  49.2 mmHg.   Left Atrium: Left atrial size was normal in size.   Right Atrium: Right atrial size was normal in size.   Pericardium: There is no evidence of pericardial effusion.   Mitral Valve: The mitral valve is normal in structure. Mild mitral valve  regurgitation. No evidence of mitral valve stenosis.   Tricuspid Valve: The tricuspid valve is normal  in structure. Tricuspid  valve regurgitation is mild . No evidence of tricuspid stenosis.   Aortic Valve: Suspect Parodoxical Normal EF Low flow Low gradient Severe  Aortic stenosis. The aortic valve is calcified. There is mild  calcification of the aortic valve. There is mild thickening of the aortic  valve. There is mild aortic valve annular  calcification. Aortic valve regurgitation is not visualized. Suspect  Parodoxical Normal EF Low flow Low gradient Severe Aortic stenosis. Aortic  valve mean gradient measures 26.0 mmHg. Aortic valve peak gradient  measures 47.3 mmHg. Aortic valve area, by  VTI measures 0.70 cm.   Pulmonic Valve: The pulmonic valve was normal in structure. Pulmonic valve  regurgitation is not visualized. No evidence of pulmonic stenosis.   Aorta: The aortic root is normal in size and structure.   Venous: The inferior vena cava is normal in size with less than 50%  respiratory variability, suggesting right atrial pressure of 8 mmHg.   IAS/Shunts: No atrial level shunt detected by color flow Doppler.     LEFT VENTRICLE  PLAX 2D  LVIDd:         5.30 cm   Diastology  LVIDs:         3.80 cm   LV e' medial:    4.40 cm/s  LV PW:         1.00 cm   LV E/e' medial:  28.4  LV IVS:        1.40 cm   LV e' lateral:   8.59 cm/s  LVOT diam:     2.10 cm   LV E/e' lateral: 14.6  LV SV:         57  LV SV Index:   26   LVOT Area:     3.46 cm     RIGHT VENTRICLE            IVC  RV S prime:     7.90 cm/s  IVC diam: 2.10 cm  TAPSE (M-mode): 1.5 cm  RVSP:           44.2 mmHg   LEFT ATRIUM              Index        RIGHT ATRIUM           Index  LA diam:        5.10 cm  2.31 cm/m   RA Pressure: 3.00 mmHg  LA Vol (A2C):   107.0 ml 48.39 ml/m  RA Area:     16.30 cm  LA Vol (A4C):   111.0 ml 50.20 ml/m  RA Volume:   48.00 ml  21.71 ml/m  LA Biplane Vol: 115.0 ml 52.01 ml/m   AORTIC VALVE  AV Area (Vmax):    0.73 cm  AV Area (Vmean):   0.74 cm  AV Area (VTI):     0.70 cm  AV Vmax:           344.00 cm/s  AV Vmean:          237.000 cm/s  AV VTI:            0.819 m  AV Peak Grad:      47.3 mmHg  AV Mean Grad:      26.0 mmHg  LVOT Vmax:         72.80 cm/s  LVOT Vmean:        50.300 cm/s  LVOT VTI:  0.165 m  LVOT/AV VTI ratio: 0.20    AORTA  Ao Root diam: 3.20 cm  Ao Asc diam:  3.10 cm   MITRAL VALVE                TRICUSPID VALVE  MV Area (PHT): 3.97 cm     TR Peak grad:   41.2 mmHg  MV Decel Time: 191 msec     TR Vmax:        321.00 cm/s  MV E velocity: 125.00 cm/s  Estimated RAP:  3.00 mmHg  MV A velocity: 95.90 cm/s   RVSP:           44.2 mmHg  MV E/A ratio:  1.30                              SHUNTS                              Systemic VTI:  0.16 m                              Systemic Diam: 2.10 cm   Kardie Tobb DO  Electronically signed by Dub Huntsman DO  Signature Date/Time: 04/13/2024/2:46:53 PM        Final     rocedures  LEFT HEART CATH AND CORONARY ANGIOGRAPHY   Conclusion      Ost RCA to Prox RCA lesion is 100% stenosed.  Prox RCA to Mid RCA lesion is 100% stenosed.  The RCA fills fills from left to right collaterals.   Ost LM to Ost LAD lesion is 50% stenosed with 80% stenosed side branch in Ost Cx to Prox Cx.  Ostial ramus lesion is 70% stenosed.   Mid LAD-1 lesion is 55% stenosed. Mid LAD-2 lesion is 50% stenosed.  Previously placed Mid LAD-3 stent of  unknown type is widely patent.   In the absence of any other complications or medical issues, we expect the patient to be ready for discharge from a cath perspective on 03/19/2024.   Plan will be to discussed with Dr. Jordan and consider if he would be a potential candidate for CVTS consultation.  Need to reassess his symptoms.  He does have echocardiographic  evidence of moderate to severe AS.  (24 mmHg per cath)   Recommend Aspirin  81mg  daily for moderate CAD.         100% flush occlusion of the RCA with retrograde filling from left-to-right collaterals filling all the way back to the occlusion. Eccentric Left Main Disease, difficult to interpret stenosis, cannot exclude up to 50%.  The LAD gives off a major diagonal branch just after the diagonal branch there is a focal 50% stenosis followed by another 50% stenosis. After large septal perforator there is a widely patent stent. The LAD reaches down around the apex and has mild diffuse luminal irregularities.. Bifurcation ostial LCx and RI 70 % RI, 80 % LCx stenosis.  Very torturous aorta iliac, abdominal aortic and aortic arch/innominate artery vessels making it very difficult to manipulate catheters either from femoral or radial access.  Would not recommend right radial access in the future.  LVEDP 24 mmHg.      RECOMMENDATIONS   In the absence of any other complications or medical issues, we expect the patient to be ready for discharge from a cath  perspective on 03/19/2024.   Plan will be to discussed with Dr. Jordan and consider if he would be a potential candidate for CVTS consultation.  Need to reassess his symptoms.  He does have echocardiographic  evidence of moderate to severe AS.  (24 mmHg per cath)   Recommend Aspirin  81mg  daily for moderate CAD.       Todd Clay, MD     Recommendations  Antiplatelet/Anticoag Recommend Aspirin  81mg  daily for moderate CAD.  Discharge Date In the absence of any other complications or  medical issues, we expect the patient to be ready for discharge from a cath perspective on 03/19/2024. Plan will be to discussed with Dr. Jordan and consider if he would be a potential candidate for CVTS consultation.  Need to reassess his symptoms.  He does have echocardiographic  evidence of moderate to severe AS.  (24 mmHg per cath)   Procedural Details  Technical Details Time Out: Verified patient identification, verified procedure, site/side was marked, verified correct patient position, special equipment/implants available, medications/allergies/relevent history reviewed, required imaging and test results available. Performed.  Access:  RIGHT radial Artery: 6 Fr sheath -- Seldinger technique using Micropuncture Kit -- Direct ultrasound guidance used.  Permanent image obtained and placed on chart. -- 10 mL radial cocktail IA; 5500 units IV Heparin  => I was not able to reach the vessels using standard catheters there was too much tortuosity in the innominate to subclavian artery.  I therefore changed the regular sheath for a 6 French 90 cm destination sheath, but despite use of this sheath I was still not able to engage coronaries.  I therefore aborted radial access, the destination sheath was removed and TR band applied.  We then turned attention to the femoral access *Right common Femoral Artery: 5 Fr Sheath -direct ultrasound and fluoroscopically guided modified Seldinger Technique using 5 French micropuncture kit. --I then exchanged the short sheath for a 25 cm sheath and then subsequently to a 6 French 45 cm destination sheath due to aortoiliac tortuosity.   Left Heart Catheterization: 5 and 6 Fr Catheters advanced or exchanged over a J-wire under direct fluoroscopic guidance into the ascending aorta; TIG 4.0 catheter advanced first.  * LV Hemodynamics (no LV Gram): Via femoral access-5 French AL-1 guide catheter. * Left Coronary Artery Cineangiography: Unable to engage via radial access  despite the use of 90 cm destination sheath.  Finally using a 6 French XB LAD 3.5 guide catheter through a 45 cm femoral destination sheath I was able to engage the Left Main.  * Right Coronary Artery Cineangiography: Multiple catheters were tried from both the radial access and femoral access including JR4, AR mod, 3 DRC and AL-1.  We were not able to inject and fill the vessel-appeared to be occluded.  Review of initial angiography revealed: RCA CTO filling from left to right collaterals with bifurcation RI/LCx stenosis and moderate LM/LAD disease.  Upon completion of Angiogaphy, the catheter was removed completely out of the body over a wire, without complication.  Femoral arterial sheath was exchanged from the 6 French 45 cm destination sheath back to a 6 French 25 cm sheath, sutured in place.   It will be removed in the  with manual pressure for hemostasis.    Radial sheath removed in the Cardiac Catheterization lab with TR Band placed for hemostasis.  TR Band: 1315  Hours; 13 mL air => reduced to 9 mL x 1440  MEDICATIONS SQ Lidocaine  4 mL radial access; 16 mL for femoral  access Radial Cocktail: 3 mg Verapmil in 10 mL NS Heparin : total 8500 Units   Estimated blood loss <50 mL.   During this procedure medications were administered to achieve and maintain moderate conscious sedation while the patient's heart rate, blood pressure, and oxygen saturation were continuously monitored and I was present face-to-face 100% of this time. Todd Spears  Todd Spears Rad Tech and [employer] are independent, trained observers who assisted in the monitoring of the patient's level of consciousness.   Medications (Filter: Administrations occurring from 1148 to 1442 on 03/19/24)  important  Continuous medications are totaled by the amount administered until 03/19/24 1442.   fentaNYL  (SUBLIMAZE ) injection (mcg)  Total dose: 75 mcg Date/Time Rate/Dose/Volume Action   03/19/24 1211  25 mcg Given   1252 25 mcg Given   1401 25 mcg Given   midazolam  PF (VERSED ) injection (mg)  Total dose: 2 mg Date/Time Rate/Dose/Volume Action   03/19/24 1211 1 mg Given   1253 1 mg Given   lidocaine  (PF) (XYLOCAINE ) 1 % injection (mL)  Total volume: 4 mL Date/Time Rate/Dose/Volume Action   03/19/24 1211 2 mL Given   1317 2 mL Given   Heparin  (Porcine) in NaCl 1000-0.9 UT/500ML-% SOLN (mL)  Total volume: 2,000 mL Date/Time Rate/Dose/Volume Action   03/19/24 1212 500 mL Given   1212 500 mL Given   1237 500 mL Given   1324 500 mL Given   Radial Cocktail/Verapamil only (mL)  Total volume: 10 mL Date/Time Rate/Dose/Volume Action   03/19/24 1213 10 mL Given   heparin  sodium (porcine) injection (Units)  Total dose: 8,500 Units Date/Time Rate/Dose/Volume Action   03/19/24 1221 5,500 Units Given   1251 3,000 Units Given   0.9 %  sodium chloride  infusion (mL/hr)  Total dose: Cannot be calculated* *Continuous medication not stopped within the calculation time range. Date/Time Rate/Dose/Volume Action   03/19/24 1406 50 mL/hr New Bag/Given   iohexol  (OMNIPAQUE ) 350 MG/ML injection (mL)  Total volume: 210 mL Date/Time Rate/Dose/Volume Action   03/19/24 1428 210 mL Given    Sedation Time  Sedation Time Physician-1: 2 hours 15 minutes 57 seconds Contrast     Administrations occurring from 1148 to 1442 on 03/19/24:  Medication Name Total Dose  iohexol  (OMNIPAQUE ) 350 MG/ML injection 210 mL   Radiation/Fluoro  Fluoro time: 54.8 (min) DAP: 241554 (mGycm2) Cumulative Air Kerma: 3281 (mGy) Complications  Complications documented before study signed (03/19/2024  3:17 PM)   No complications were associated with this study.  Documented by Luella Richerd HERO, RCIS - 03/19/2024  2:29 PM     Coronary Findings  Diagnostic Dominance: Right Left Main  Ost LM to Ost LAD lesion is 50% stenosed with 80% stenosed side branch in Ost Cx to Prox Cx.    Left Anterior Descending   There is mild diffuse disease throughout the vessel.  Mid LAD-1 lesion is 55% stenosed.  Mid LAD-2 lesion is 50% stenosed.  Previously placed Mid LAD-3 stent of unknown type is widely patent. The lesion is focal.    First Diagonal Branch  Vessel is moderate in size.    Third Diagonal Branch  Vessel is small in size.    Ramus Intermedius  Vessel is moderate in size.  Ramus lesion is 70% stenosed.    Left Circumflex  Vessel is large.    Left Posterior Atrioventricular Artery  Vessel is small in size.    Right Coronary Artery  Ost RCA to Prox RCA lesion is 100%  stenosed. The lesion is chronically occluded with left-to-right collateral flow. The lesion is severely calcified.  Prox RCA to Mid RCA lesion is 100% stenosed. The lesion was previously treated using a stent (unknown type) over 2 years ago. Previously placed stent displays restenosis.    Right Ventricular Branch  Vessel is small in size.    Right Posterior Descending Artery  Vessel is moderate in size.  Collaterals  RPDA filled by collaterals from 3rd Diag.      Second Right Posterolateral Branch  Collaterals  2nd RPL filled by collaterals from 1st Mrg.      Third Right Posterolateral Branch  Collaterals  3rd RPL filled by collaterals from 1st LPL.      Intervention   No interventions have been documented.   Coronary Diagrams  Diagnostic Dominance: Right  Intervention   Implants   No implant documentation for this case.   Syngo Images   Show images for CARDIAC CATHETERIZATION Images on Long Term Storage   Show images for Zeppelin, Commisso to Procedure Log  Procedure Log    Hemo Data  Flowsheet Row Most Recent Value  Aortic Mean Gradient 24.1 mmHg  Aortic Peak Gradient 29.9 mmHg  AO Systolic Pressure 119 mmHg  AO Diastolic Pressure 68 mmHg  AO Mean 89 mmHg  LV Systolic Pressure 158 mmHg  LV Diastolic Pressure 5 mmHg  LV EDP 21 mmHg  Arterial Occlusion Pressure Extended Systolic  Pressure 134 mmHg  Arterial Occlusion Pressure Extended Diastolic Pressure 58 mmHg  Arterial Occlusion Pressure Extended Mean Pressure 90 mmHg  Left Ventricular Apex Extended Systolic Pressure 159 mmHg  LVp Diastolic Pressure 6 mmHg  Left Ventricular Apex Extended EDP Pressure 23 mmHg    Impression:  This 78 year old gentleman has severe left main and multivessel coronary disease with severe low-flow/low gradient aortic stenosis with NYHA class II symptoms of exertional fatigue and shortness of breath consistent with chronic diastolic congestive heart failure.  He has had no anginal symptoms.  I agree that aortic valve replacement using a bioprosthetic valve and coronary bypass graft surgery is the best treatment for him.  He does have some calcification in his ascending aorta noted on cardiac catheterization and he will require a CTA of the chest to evaluate this further.  I reviewed the cardiac catheterization and echo images with the patient and his wife and answered all of their questions. I discussed the operative procedure with the patient and his wife including alternatives, benefits and risks; including but not limited to bleeding, blood transfusion, infection, stroke, myocardial infarction, graft failure, heart block requiring a permanent pacemaker, organ dysfunction, and death.  Todd DELENA German understands and agrees to proceed.     Plan:  He is scheduled for a CTA of the chest on 04/16/2024.  He has been scheduled for aortic valve replacement and coronary artery bypass graft surgery on Friday, 04/23/2024.  I spent 60 minutes performing this consultation and > 50% of this time was spent face to face counseling and coordinating the care of this patient's severe symptomatic aortic stenosis and multivessel coronary disease.  Dorise MARLA Fellers, MD [facility] [phone]

## 2024-04-16 ENCOUNTER — Ambulatory Visit (HOSPITAL_COMMUNITY)
Admission: RE | Admit: 2024-04-16 | Discharge: 2024-04-16 | Disposition: A | Discharge status: Home / Self Care | Source: Ambulatory Visit | Attending: Surgery | Admitting: Surgery

## 2024-04-16 DIAGNOSIS — I350 Nonrheumatic aortic (valve) stenosis: Secondary | ICD-10-CM | POA: Insufficient documentation

## 2024-04-16 DIAGNOSIS — J90 Pleural effusion, not elsewhere classified: Secondary | ICD-10-CM | POA: Diagnosis not present

## 2024-04-16 DIAGNOSIS — I2510 Atherosclerotic heart disease of native coronary artery without angina pectoris: Secondary | ICD-10-CM | POA: Diagnosis not present

## 2024-04-16 DIAGNOSIS — R918 Other nonspecific abnormal finding of lung field: Secondary | ICD-10-CM | POA: Diagnosis not present

## 2024-04-16 MED ORDER — IOHEXOL 350 MG/ML SOLN
75.0000 mL | Freq: Once | INTRAVENOUS | Status: AC | PRN
  Administered 2024-04-16: 75 mL via INTRAVENOUS

## 2024-04-20 ENCOUNTER — Other Ambulatory Visit: Payer: Self-pay | Admitting: *Deleted

## 2024-04-21 ENCOUNTER — Telehealth: Payer: Self-pay | Admitting: Cardiology

## 2024-04-21 ENCOUNTER — Other Ambulatory Visit (HOSPITAL_COMMUNITY)

## 2024-04-21 ENCOUNTER — Other Ambulatory Visit: Payer: Self-pay | Admitting: *Deleted

## 2024-04-21 ENCOUNTER — Ambulatory Visit: Attending: Surgery | Admitting: Surgery

## 2024-04-21 ENCOUNTER — Encounter: Payer: Self-pay | Admitting: Surgery

## 2024-04-21 ENCOUNTER — Other Ambulatory Visit: Payer: Self-pay

## 2024-04-21 ENCOUNTER — Other Ambulatory Visit: Payer: Self-pay | Admitting: Surgery

## 2024-04-21 VITALS — BP 160/75 | HR 71 | Resp 18 | Ht 67.0 in | Wt 245.0 lb

## 2024-04-21 DIAGNOSIS — I2510 Atherosclerotic heart disease of native coronary artery without angina pectoris: Secondary | ICD-10-CM

## 2024-04-21 DIAGNOSIS — I350 Nonrheumatic aortic (valve) stenosis: Secondary | ICD-10-CM

## 2024-04-21 MED ORDER — LOSARTAN POTASSIUM 25 MG PO TABS: 25.0000 mg | ORAL_TABLET | Freq: Every day | ORAL | 3 refills | Status: DC

## 2024-04-21 NOTE — Telephone Encounter (Signed)
 Jordan, Peter M, MD to Me     04/21/24  2:24 PM Once a day is ok   Peter Jordan MD, [facility]  Patient notified.  He will continue to monitor his BP and let us  know if it becomes elevated.  Refill sent [facility]

## 2024-04-21 NOTE — Telephone Encounter (Signed)
 I spoke with patient.  He reports Losartan  was prescribed at office visit on 03/09/24 with Olivia Pavy, PA.  Patient reports he thought he was instructed at that time to take Losartan  25 mg twice daily and that is how he has been taking.  Prescription was written for Losartan  25 daily. Will forward to Dr Jordan to see what dose he recommends patient take.  Patient will need [facility] [place]

## 2024-04-21 NOTE — Telephone Encounter (Signed)
  Pt c/o medication issue:  1. Name of Medication:   losartan  (COZAAR ) 25 MG tablet    2. How are you currently taking this medication (dosage and times per day)?   Take 1 tablet (25 mg total) by mouth daily    3. Are you having a reaction (difficulty breathing--STAT)? No   4. What is your medication issue? The patient stated that [name] changed his dosage to two tablets a day. He needs a new prescription sent to his pharmacy with the updated script. His pharmacy at [facility] in [place]

## 2024-04-21 NOTE — Progress Notes (Signed)
 [address] [place] [address]             [phone]    HPI:  Todd Spears returns today to discuss the results of his CTA of the chest and make further plans for surgical treatment of his multivessel coronary disease and severe aortic stenosis.  I had planned on doing coronary artery bypass graft surgery and aortic valve replacement this Friday but his CTA of the chest showed significant aortic root and ascending aortic calcification extending out into the aortic arch.  I was concerned about being able to cross-clamp his aorta and open up the aorta to be able to replace the aortic valve.  I decided to postpone his surgical procedure and discuss him at TAVR conference.  The consensus opinion was that he has severe left main and multivessel coronary disease with severe calcification of the left main extending into the proximal left circumflex and LAD which would make percutaneous intervention risky.  It was also felt that this type of intervention would not be possible with a transcatheter valve in place.  The consensus was that it would be best to consider coronary bypass surgery, possibly off-pump or on pump and beating and then consider TAVR if open surgical AVR was not felt to be possible.  The patient continues to do well overall.  He still has some exertional shortness of breath and fatigue but has had no chest discomfort.  Current Outpatient Medications  Medication Sig Dispense Refill   albuterol  (VENTOLIN  HFA) 108 (90 Base) MCG/ACT inhaler Inhale 2 puffs into the lungs every 6 (six) hours as needed for wheezing or shortness of breath. 8.5 each 3   amLODipine  (NORVASC ) 10 MG tablet TAKE 1 TABLET BY MOUTH EVERY DAY 90 tablet 1   aspirin  81 MG chewable tablet Chew 81 mg by mouth daily.     Azelastine  HCl 137 MCG/SPRAY SOLN INSTILL 2 SPRAYS INTO BOTH NOSTRILS 2 TIMES DAILY AS DIRECTED (Patient taking differently: Place 1 spray into both nostrils daily as needed  (Congestion).) 90 mL 1   cetirizine (ZYRTEC) 10 MG tablet Take 10 mg by mouth daily.     Cyanocobalamin  (VITAMIN B 12 PO) Take 1 tablet by mouth daily.      esomeprazole  (NEXIUM ) 40 MG capsule TAKE 1 CAPSULE BY MOUTH DAILY BEFORE A MEAL 90 capsule 2   ezetimibe  (ZETIA ) 10 MG tablet TAKE 1 TABLET BY MOUTH EVERY DAY 90 tablet 3   FeFum-FePoly-FA-B Cmp-C-Biot (INTEGRA PLUS ) CAPS TAKE 1 CAPSULE BY MOUTH DAILY 90 capsule 3   fluticasone  (FLONASE ) 50 MCG/ACT nasal spray Place 2 sprays into both nostrils 2 (two) times daily. (Patient taking differently: Place 2 sprays into both nostrils 2 (two) times daily as needed for rhinitis.) 16 g 6   furosemide  (LASIX ) 20 MG tablet Take 1 tablet (20 mg total) by mouth daily. 90 tablet 3   hydrALAZINE  (APRESOLINE ) 50 MG tablet TAKE 2 TABLETS BY MOUTH 3 TIMES A DAY 540 tablet 1   LORazepam  (ATIVAN ) 0.5 MG tablet Take 0.5 mg daily as needed 30 tablet 3   losartan  (COZAAR ) 25 MG tablet Take 1 tablet (25 mg total) by mouth daily. (Patient taking differently: Take 25 mg by mouth 2 (two) times daily.) 90 tablet 3   LUMIGAN 0.01 % SOLN Place 1 drop into both eyes at bedtime.     metoprolol  succinate (TOPROL -XL) 50 MG 24 hr tablet TAKE 1 TABLET EVERY DAY WITH OR IMMEDIATELY  FOLLOWING A MEAL 90 tablet 2   montelukast  (SINGULAIR ) 10 MG tablet TAKE 1 TABLET BY MOUTH EVERYDAY AT BEDTIME 90 tablet 3   Multiple Vitamin (MULTIVITAMIN WITH MINERALS) TABS tablet Take 1 tablet by mouth daily. Adult 50+     nitroGLYCERIN  (NITROSTAT ) 0.4 MG SL tablet Place 1 tablet (0.4 mg total) under the tongue every 5 (five) minutes as needed for chest pain (up to 3 doses. not within 24hrs of Viagra ). 25 tablet 6   potassium chloride  SA (KLOR-CON  M) 20 MEQ tablet Take 1 tablet (20 mEq total) by mouth daily. 90 tablet 3   pravastatin  (PRAVACHOL ) 10 MG tablet TAKE 1 TABLET EVERY OTHER DAY, ALTERNATING WITH 2 TABLETS. 135 tablet 1   traMADol  (ULTRAM ) 50 MG tablet TAKE 1 TABLET BY MOUTH EVERY 12 HOURS.  (Patient taking differently: Take 50 mg by mouth every 12 (twelve) hours as needed for moderate pain (pain score 4-6).) 60 tablet 5   No current facility-administered medications for this visit.     Physical Exam: BP (!) 160/75 (BP Location: Left Arm)   Pulse 71   Resp 18   Ht 5' 7 (1.702 m)   Wt 245 lb (111.1 kg)   SpO2 97%   BMI 38.37 kg/m  He looks well. Cardiac exam shows a regular rate and rhythm with a 3/6 systolic murmur along the right sternal border.  There is no diastolic murmur. Lungs are clear.  Diagnostic Tests:  Narrative & Impression  CLINICAL DATA:  Coronary artery disease and mild aortic stenosis.   EXAM: CT ANGIOGRAPHY CHEST WITH CONTRAST   TECHNIQUE: Multidetector CT imaging of the chest was performed using the standard protocol during bolus administration of intravenous contrast. Multiplanar CT image reconstructions and MIPs were obtained to evaluate the vascular anatomy.   RADIATION DOSE REDUCTION: This exam was performed according to the departmental dose-optimization program which includes automated exposure control, adjustment of the mA and/or kV according to patient size and/or use of iterative reconstruction technique.   CONTRAST:  75mL OMNIPAQUE  IOHEXOL  350 MG/ML SOLN   COMPARISON:  None Available.   FINDINGS: Cardiovascular: There is marked severity calcification of the thoracic aorta, without evidence of aortic aneurysm or dissection. Satisfactory opacification of the pulmonary arteries to the segmental level. No evidence of pulmonary embolism. Normal heart size with marked severity coronary artery disease. No pericardial effusion.   Mediastinum/Nodes: No enlarged mediastinal, hilar, or axillary lymph nodes. Thyroid  gland, trachea, and esophagus demonstrate no significant findings.   Lungs/Pleura: There is a 3 mm anterior right upper lobe pulmonary nodule (axial CT image 47, CT series 302).   Mild linear scarring, atelectasis  and/or infiltrate is seen within the posterior aspect of the right lung base. There is a small right pleural effusion. No pneumothorax is identified.   Upper Abdomen: No acute abnormality.   Musculoskeletal: No chest wall abnormality. No acute or significant osseous findings.   Review of the MIP images confirms the above findings.   IMPRESSION: 1. No evidence of pulmonary embolism. 2. Mild linear scarring, atelectasis and/or infiltrate within the posterior aspect of the right lung base. 3. Small right pleural effusion. 4. 3 mm anterior right upper lobe pulmonary nodule. No follow-up needed if patient is low-risk.This recommendation follows the consensus statement: Guidelines for Management of Incidental Pulmonary Nodules Detected on CT Images: From the Fleischner Society 2017; Radiology 2017; 284:228-243. 5. Marked severity coronary artery disease. 6. Aortic atherosclerosis.     Electronically Signed   By: Suzen Dwane HERO.D.  On: 04/16/2024 16:40      Impression:  I reviewed the CTA images with the patient and his wife and answered all their questions.  I think the best option for this patient is going to be to proceed with coronary bypass graft surgery either on pump with the heart beating or arrested depending on the ability to cross-clamp the aorta.  If the proximal ascending aorta appears suitable to open then I would plan to proceed with aortic valve replacement at that time.  If I do not feel it is safe to open the aorta then we will plan to perform TAVR later.  He could have some hemodynamic difficulties postop with persistent severe aortic stenosis present after coronary bypass graft surgery.  We will get a gated cardiac CTA of the chest to be sure that he is a potential candidate for TAVR later if necessary. I discussed the operative procedure with the patient and his wife including alternatives, benefits and risks; including but not limited to bleeding, blood  transfusion, infection, stroke, myocardial infarction, graft failure, heart block requiring a permanent pacemaker, organ dysfunction, and death.  [name] understands and agrees to proceed.    Plan:  He will be scheduled for coronary bypass graft surgery and possible open aortic valve replacement on Monday, 04/26/2024.  I spent 15 minutes performing this established patient evaluation and > 50% of this time was spent face to face counseling and coordinating the care of this patient's severe multivessel coronary disease and severe aortic stenosis.   Dorise MARLA Fellers, MD [facility] [phone]

## 2024-04-22 ENCOUNTER — Ambulatory Visit (HOSPITAL_COMMUNITY)
Admission: RE | Admit: 2024-04-22 | Discharge: 2024-04-22 | Disposition: A | Discharge status: Home / Self Care | Source: Ambulatory Visit | Attending: Surgery | Admitting: Surgery

## 2024-04-22 DIAGNOSIS — I350 Nonrheumatic aortic (valve) stenosis: Secondary | ICD-10-CM | POA: Diagnosis not present

## 2024-04-22 MED ORDER — IOHEXOL 350 MG/ML SOLN
100.0000 mL | Freq: Once | INTRAVENOUS | Status: AC | PRN
  Administered 2024-04-22: 100 mL via INTRAVENOUS

## 2024-04-22 NOTE — Progress Notes (Signed)
 Surgical Instructions   Your procedure is scheduled on Monday, November 24th. Report to [facility] Main Entrance A at 5:30 A.M., then check in with the Admitting office. Any questions or running late day of surgery: call [phone]  Questions prior to your surgery date: call [phone], Monday-Friday, 8am-4pm. If you experience any cold or flu symptoms such as cough, fever, chills, shortness of breath, etc. between now and your scheduled surgery, please notify us  at the above number.     Remember:  Do not eat or drink after midnight the night before your surgery.  This includes no gum, mints, or hard candy.   Take these medicines the morning of surgery with A SIP OF WATER  amLODipine  (NORVASC )  cetirizine (ZYRTEC)  esomeprazole  (NEXIUM )  ezetimibe  (ZETIA )  hydrALAZINE  (APRESOLINE )  metoprolol  succinate (TOPROL -XL)  pravastatin  (PRAVACHOL )    May take these medicines IF NEEDED: albuterol  (VENTOLIN  HFA) inhaler - bring with you on day of surgery.  fluticasone  (FLONASE )  LORazepam  (ATIVAN )  nitroGLYCERIN  (NITROSTAT ) - call [phone] after taking this   Per your cardiologist's instruction, HOLD your aspirin  on day of surgery.  Last dose on Sunday, November 23rd   One week prior to surgery, STOP taking any Aleve, Naproxen, Ibuprofen, Motrin, Advil, Goody's, BC's, all herbal medications, fish oil, and non-prescription vitamins.                     Do NOT Smoke (Tobacco/Vaping) for 24 hours prior to your procedure.  If you use a CPAP at night, you may bring your mask/headgear for your overnight stay.   You will be asked to remove any contacts, glasses, piercing's, hearing aid's, dentures/partials prior to surgery. Please bring cases for these items if needed.    Patients discharged the day of surgery will not be allowed to drive home, and someone needs to stay with them for 24 hours.  SURGICAL WAITING ROOM VISITATION Patients may have no more than 2 support people in the  waiting area - these visitors may rotate.   Pre-op nurse will coordinate an appropriate time for 1 ADULT support person, who may not rotate, to accompany patient in pre-op.  Children under the age of 35 must have an adult with them who is not the patient and must remain in the main waiting area with an adult.  If the patient needs to stay at the hospital during part of their recovery, the visitor guidelines for inpatient rooms apply.  Please refer to the [facility] website for the visitor guidelines for any additional information.   If you received a COVID test during your pre-op visit  it is requested that you wear a mask when out in public, stay away from anyone that may not be feeling well and notify your surgeon if you develop symptoms. If you have been in contact with anyone that has tested positive in the last 10 days please notify you surgeon.      Pre-operative CHG Bathing Instructions   You can play a key role in reducing the risk of infection after surgery. Your skin needs to be as free of germs as possible. You can reduce the number of germs on your skin by washing with CHG (chlorhexidine  gluconate) soap before surgery. CHG is an antiseptic soap that kills germs and continues to kill germs even after washing.   DO NOT use if you have an allergy to chlorhexidine /CHG or antibacterial soaps. If your skin becomes reddened or irritated, stop using the CHG and  notify one of our RNs at [phone].              TAKE A SHOWER THE NIGHT BEFORE SURGERY   Please keep in mind the following:  DO NOT shave, including legs and underarms, 48 hours prior to surgery.   You may shave your face before/day of surgery.  Place clean sheets on your bed the night before surgery Use a clean washcloth (not used since being washed) for shower. DO NOT sleep with pet's night before surgery.  CHG Shower Instructions:  Wash your face and private area with normal soap. If you choose to wash your hair, wash  first with your normal shampoo.  After you use shampoo/soap, rinse your hair and body thoroughly to remove shampoo/soap residue.  Turn the water OFF and apply half the bottle of CHG soap to a CLEAN washcloth.  Apply CHG soap ONLY FROM YOUR NECK DOWN TO YOUR TOES (washing for 3-5 minutes)  DO NOT use CHG soap on face, private areas, open wounds, or sores.  Pay special attention to the area where your surgery is being performed.  If you are having back surgery, having someone wash your back for you may be helpful. Wait 2 minutes after CHG soap is applied, then you may rinse off the CHG soap.  Pat dry with a clean towel  Put on clean pajamas    Additional instructions for the day of surgery: If you choose, you may shower the morning of surgery with an antibacterial soap.  DO NOT APPLY any lotions, deodorants, cologne, or perfumes.   Do not wear jewelry or makeup Do not wear nail polish, gel polish, artificial nails, or any other type of covering on natural nails (fingers and toes) Do not bring valuables to the hospital. [facility] is not responsible for valuables/personal belongings. Put on clean/comfortable clothes.  Please brush your teeth.  Ask your nurse before applying any prescription medications to the skin.

## 2024-04-22 NOTE — Hospital Course (Addendum)
 HPI: The patient is a 78 year old gentleman with history of hypertension, hyperlipidemia, stage III chronic kidney disease, obesity, and coronary artery disease status post stenting of the mid LAD and mid RCA in 04/2012.  He reportedly had no significant symptoms at that time but an abnormal stress test.  He has been followed by cardiology and presented in September 2025 with shortness of breath on exertion.  A PET/CT showed anterior ischemia with infarction in the inferior wall.  Ejection fraction was 37%.  He underwent cardiac catheterization on 03/19/2024 showing left main and three-vessel coronary disease.  The aortic valve gradient was 24 mmHg with an elevated end-diastolic pressure of 24 mmHg.  A 2D echocardiogram on 04/13/2024 showed a calcified and thickened aortic valve with restricted leaflet mobility.  The mean gradient was 26 mmHg with a peak of 47 mmHg and a valve area by VTI of 0.7 cm suggesting low-flow/low gradient severe aortic stenosis.  Stroke-volume index was low at 26 and dimensionless index was 0.2.  Left ventricular ejection fraction was mildly reduced at 50 to 55%.  There is mild mitral regurgitation. CTA done 11/14 showed no PE, small right pleural effusion, 3 mm anterior RUL. Dr. Lucas discussed the need for coronary artery bypass grafting surgery and possible aortic valve replacement (depending how much and the location of calcification of the aorta).  Hospital Course: Patient underwent a CABG x 4. Of note, he has significant aortic root and ascending aortic calcification . As a result, the decision was made to not replace the aortic valve. He will be evaluated far a TAVR in the future. He was extubated early the evening of surgery. Norva Purl, a line, and chest tubes were removed on POD 1. He has a history of CKD (stage IIIa). Creatinine post op went up to 1.62. His baseline is around 1.3. He was transitioned off the Insulin  drip. His pre op HGA1C ws 5.5. Accu checks and SS PRN will  be stopped after transfer to the floor. Foley and sleeve were removed on POD 2. She was started on low dose Lopressor  12.5 mg bid.  However, he developed bradycardia overnight and his Lopressor  was discontinued. He was volume overloaded and Demadex  was started on POD 2 with careful monitoring of creatinine.  He has urinary issues prior to admission, due to this he was started on Flomax  with foley catheter removed on 11/28. Regarding rhythm, he had a few episodes of bradycardia (not on a BB). EPW set at VVI 50. If no further bradycardia, will remove EPW 11/29. He was felt stable for transfer from the ICU to 4E for further convalescence on 11/28.  The patient underwent PT/OT evaluation who recommended no PT/OT followup.  He had brief episode of PAF but converted quickly.  He remains clinically stable.  His ambulating with assistance of a walker.  His surgical incisions are healing without evidence of infection.  He is medically stable for discharge home today.

## 2024-04-23 ENCOUNTER — Encounter (HOSPITAL_COMMUNITY): Payer: Self-pay | Admitting: Surgery

## 2024-04-23 ENCOUNTER — Encounter (HOSPITAL_COMMUNITY)
Admission: RE | Admit: 2024-04-23 | Discharge: 2024-04-23 | Disposition: A | Discharge status: Home / Self Care | Source: Ambulatory Visit | Attending: Surgery | Admitting: Surgery

## 2024-04-23 ENCOUNTER — Other Ambulatory Visit: Payer: Self-pay

## 2024-04-23 ENCOUNTER — Ambulatory Visit (HOSPITAL_COMMUNITY)
Admission: RE | Admit: 2024-04-23 | Discharge: 2024-04-23 | Disposition: A | Discharge status: Home / Self Care | Source: Ambulatory Visit | Attending: Surgery | Admitting: Surgery

## 2024-04-23 ENCOUNTER — Encounter (HOSPITAL_COMMUNITY): Payer: Self-pay

## 2024-04-23 ENCOUNTER — Inpatient Hospital Stay (HOSPITAL_COMMUNITY): Admit: 2024-04-23 | Admitting: Surgery

## 2024-04-23 VITALS — BP 144/69 | HR 71 | Temp 98.7°F | Resp 18 | Ht 67.0 in | Wt 246.9 lb

## 2024-04-23 DIAGNOSIS — I700 Atherosclerosis of aorta: Secondary | ICD-10-CM | POA: Diagnosis not present

## 2024-04-23 DIAGNOSIS — E785 Hyperlipidemia, unspecified: Secondary | ICD-10-CM | POA: Diagnosis not present

## 2024-04-23 DIAGNOSIS — I350 Nonrheumatic aortic (valve) stenosis: Secondary | ICD-10-CM

## 2024-04-23 DIAGNOSIS — J45909 Unspecified asthma, uncomplicated: Secondary | ICD-10-CM | POA: Diagnosis not present

## 2024-04-23 DIAGNOSIS — J90 Pleural effusion, not elsewhere classified: Secondary | ICD-10-CM | POA: Diagnosis not present

## 2024-04-23 DIAGNOSIS — I480 Paroxysmal atrial fibrillation: Secondary | ICD-10-CM | POA: Diagnosis not present

## 2024-04-23 DIAGNOSIS — E669 Obesity, unspecified: Secondary | ICD-10-CM | POA: Diagnosis not present

## 2024-04-23 DIAGNOSIS — R1310 Dysphagia, unspecified: Secondary | ICD-10-CM | POA: Diagnosis not present

## 2024-04-23 DIAGNOSIS — Z01818 Encounter for other preprocedural examination: Secondary | ICD-10-CM

## 2024-04-23 DIAGNOSIS — I517 Cardiomegaly: Secondary | ICD-10-CM | POA: Diagnosis not present

## 2024-04-23 DIAGNOSIS — Z7982 Long term (current) use of aspirin: Secondary | ICD-10-CM | POA: Diagnosis not present

## 2024-04-23 DIAGNOSIS — Z8249 Family history of ischemic heart disease and other diseases of the circulatory system: Secondary | ICD-10-CM | POA: Diagnosis not present

## 2024-04-23 DIAGNOSIS — N1831 Chronic kidney disease, stage 3a: Secondary | ICD-10-CM | POA: Diagnosis not present

## 2024-04-23 DIAGNOSIS — I2510 Atherosclerotic heart disease of native coronary artery without angina pectoris: Secondary | ICD-10-CM

## 2024-04-23 DIAGNOSIS — I129 Hypertensive chronic kidney disease with stage 1 through stage 4 chronic kidney disease, or unspecified chronic kidney disease: Secondary | ICD-10-CM | POA: Diagnosis not present

## 2024-04-23 LAB — HEMOGLOBIN A1C
Hgb A1c MFr Bld: 5.5 % (ref 4.8–5.6)
Mean Plasma Glucose: 111.15 mg/dL

## 2024-04-23 LAB — URINALYSIS, ROUTINE W REFLEX MICROSCOPIC
Bilirubin Urine: NEGATIVE
Glucose, UA: NEGATIVE mg/dL
Hgb urine dipstick: NEGATIVE
Ketones, ur: NEGATIVE mg/dL
Leukocytes,Ua: NEGATIVE
Nitrite: NEGATIVE
Protein, ur: NEGATIVE mg/dL
Specific Gravity, Urine: 1.005 (ref 1.005–1.030)
pH: 7 (ref 5.0–8.0)

## 2024-04-23 LAB — CBC
HCT: 35.6 % — ABNORMAL LOW (ref 39.0–52.0)
Hemoglobin: 11.5 g/dL — ABNORMAL LOW (ref 13.0–17.0)
MCH: 31.9 pg (ref 26.0–34.0)
MCHC: 32.3 g/dL (ref 30.0–36.0)
MCV: 98.6 fL (ref 80.0–100.0)
Platelets: 215 K/uL (ref 150–400)
RBC: 3.61 MIL/uL — ABNORMAL LOW (ref 4.22–5.81)
RDW: 13.2 % (ref 11.5–15.5)
WBC: 6.6 K/uL (ref 4.0–10.5)
nRBC: 0 % (ref 0.0–0.2)

## 2024-04-23 LAB — COMPREHENSIVE METABOLIC PANEL WITH GFR
ALT: 28 U/L (ref 0–44)
AST: 31 U/L (ref 15–41)
Albumin: 3.8 g/dL (ref 3.5–5.0)
Alkaline Phosphatase: 47 U/L (ref 38–126)
Anion gap: 10 (ref 5–15)
BUN: 17 mg/dL (ref 8–23)
CO2: 25 mmol/L (ref 22–32)
Calcium: 9.2 mg/dL (ref 8.9–10.3)
Chloride: 100 mmol/L (ref 98–111)
Creatinine, Ser: 1.25 mg/dL — ABNORMAL HIGH (ref 0.61–1.24)
GFR, Estimated: 59 mL/min — ABNORMAL LOW (ref >60)
Glucose, Bld: 118 mg/dL — ABNORMAL HIGH (ref 70–99)
Potassium: 4 mmol/L (ref 3.5–5.1)
Sodium: 135 mmol/L (ref 135–145)
Total Bilirubin: 0.9 mg/dL (ref 0.0–1.2)
Total Protein: 7.4 g/dL (ref 6.5–8.1)

## 2024-04-23 LAB — PROTIME-INR
INR: 1.1 (ref 0.8–1.2)
Prothrombin Time: 14.3 s (ref 11.4–15.2)

## 2024-04-23 LAB — SURGICAL PCR SCREEN
MRSA, PCR: POSITIVE — AB
Staphylococcus aureus: POSITIVE — AB

## 2024-04-23 LAB — APTT: aPTT: 35 s (ref 24–36)

## 2024-04-23 SURGERY — CORONARY ARTERY BYPASS GRAFTING (CABG)
Anesthesia: General | Site: Chest

## 2024-04-23 MED ORDER — INSULIN REGULAR(HUMAN) IN NACL 100-0.9 UT/100ML-% IV SOLN
INTRAVENOUS | Status: AC
  Administered 2024-04-26: 1.7 [IU]/h via INTRAVENOUS
  Filled 2024-04-23: qty 100

## 2024-04-23 MED ORDER — CEFAZOLIN SODIUM-DEXTROSE 2-4 GM/100ML-% IV SOLN: 2.0000 g | INTRAVENOUS | Status: DC

## 2024-04-23 MED ORDER — CEFAZOLIN SODIUM-DEXTROSE 2-4 GM/100ML-% IV SOLN
2.0000 g | INTRAVENOUS | Status: AC
  Administered 2024-04-26: 2 g via INTRAVENOUS
  Filled 2024-04-23: qty 100

## 2024-04-23 MED ORDER — PLASMA-LYTE A IV SOLN
INTRAVENOUS | Status: DC
  Filled 2024-04-23: qty 2.5

## 2024-04-23 MED ORDER — PHENYLEPHRINE HCL-NACL 20-0.9 MG/250ML-% IV SOLN
30.0000 ug/min | INTRAVENOUS | Status: AC
  Administered 2024-04-26: 25 ug/min via INTRAVENOUS
  Filled 2024-04-23: qty 250

## 2024-04-23 MED ORDER — POTASSIUM CHLORIDE 2 MEQ/ML IV SOLN
80.0000 meq | INTRAVENOUS | Status: DC
  Filled 2024-04-23: qty 40

## 2024-04-23 MED ORDER — HEPARIN 30,000 UNITS/1000 ML (OHS) CELLSAVER SOLUTION
Status: DC
  Filled 2024-04-23: qty 1000

## 2024-04-23 MED ORDER — NITROGLYCERIN IN D5W 200-5 MCG/ML-% IV SOLN
2.0000 ug/min | INTRAVENOUS | Status: DC
  Filled 2024-04-23: qty 250

## 2024-04-23 MED ORDER — MILRINONE LACTATE IN DEXTROSE 20-5 MG/100ML-% IV SOLN
0.3000 ug/kg/min | INTRAVENOUS | Status: AC
  Administered 2024-04-26: .25 ug/kg/min via INTRAVENOUS
  Filled 2024-04-23: qty 100

## 2024-04-23 MED ORDER — NOREPINEPHRINE 4 MG/250ML-% IV SOLN
0.0000 ug/min | INTRAVENOUS | Status: DC
  Filled 2024-04-23: qty 250

## 2024-04-23 MED ORDER — VANCOMYCIN HCL 1.5 G IV SOLR
1500.0000 mg | INTRAVENOUS | Status: AC
  Administered 2024-04-26: 1500 mg via INTRAVENOUS
  Filled 2024-04-23: qty 30

## 2024-04-23 MED ORDER — TRANEXAMIC ACID (OHS) BOLUS VIA INFUSION
15.0000 mg/kg | INTRAVENOUS | Status: AC
  Administered 2024-04-26: 1680 mg via INTRAVENOUS
  Filled 2024-04-23: qty 1680

## 2024-04-23 MED ORDER — TRANEXAMIC ACID (OHS) PUMP PRIME SOLUTION
2.0000 mg/kg | INTRAVENOUS | Status: DC
  Filled 2024-04-23: qty 2.24

## 2024-04-23 MED ORDER — EPINEPHRINE HCL 5 MG/250ML IV SOLN IN NS
0.0000 ug/min | INTRAVENOUS | Status: DC
  Filled 2024-04-23: qty 250

## 2024-04-23 MED ORDER — DEXMEDETOMIDINE HCL IN NACL 400 MCG/100ML IV SOLN
0.1000 ug/kg/h | INTRAVENOUS | Status: AC
  Administered 2024-04-26: .4 ug/kg/h via INTRAVENOUS
  Filled 2024-04-23: qty 100

## 2024-04-23 MED ORDER — MANNITOL 20 % IV SOLN
INTRAVENOUS | Status: DC
  Filled 2024-04-23: qty 13

## 2024-04-23 MED ORDER — TRANEXAMIC ACID 1000 MG/10ML IV SOLN
1.5000 mg/kg/h | INTRAVENOUS | Status: AC
  Administered 2024-04-26: 1.5 mg/kg/h via INTRAVENOUS
  Filled 2024-04-23: qty 25

## 2024-04-23 NOTE — Progress Notes (Signed)
 Physician's office has been notified of abnormal PCR.

## 2024-04-23 NOTE — Progress Notes (Signed)
 PCP - Dr. Worth Kitty Cardiologist - Dr. Peter Jordan, MD LOV 03-23-24  PPM/ICD - Denies Device Orders - n/a Rep Notified - n/a  Chest x-ray - 04-23-24 EKG - 04-23-24 Stress Test - 03-15-24 ECHO - 04-13-24 Cardiac Cath - 03-19-24  Sleep Study - denies CPAP - n/a  Non-diabetic  Last dose of GLP1 agonist-  denies GLP1 instructions: n/a  Blood Thinner Instructions: denies Aspirin  Instructions: Hold DOS, LD on 04/25/24  ERAS Protcol - NPO PRE-SURGERY Ensure or G2- n/a  COVID TEST- n/a   Anesthesia review: Yes, HTN, CAD, HLD, Asthma  Patient denies shortness of breath, fever, cough and chest pain at PAT appointment. Patient denies any respiratory issues at this time.    All instructions explained to the patient, with a verbal understanding of the material. Patient agrees to go over the instructions while at home for a better understanding. Patient also instructed to self quarantine after being tested for COVID-19. The opportunity to ask questions was provided.

## 2024-04-25 NOTE — H&P (Signed)
 [address], [place] [address]             [phone]    Cardiothoracic Surgery Admission History and Physical  PCP is Kennyth, Worth HERO, MD Referring Provider is Jordan, Peter M, MD       Chief Complaint  Patient presents with   Coronary Artery Disease   Aortic Stenosis      Surgical consult      HPI:   The patient is a 78 year old gentleman with history of hypertension, hyperlipidemia, stage III chronic kidney disease, obesity, and coronary artery disease status post stenting of the mid LAD and mid RCA in 04/2012.  He reportedly had no significant symptoms at that time but an abnormal stress test.  He has been followed by cardiology and presented in September 2025 with shortness of breath on exertion.  A PET/CT showed anterior ischemia with infarction in the inferior wall.  Ejection fraction was 37%.  He underwent cardiac catheterization on 03/19/2024 showing left main and three-vessel coronary disease.  The aortic valve gradient was 24 mmHg with an elevated end-diastolic pressure of 24 mmHg.  A 2D echocardiogram on 04/13/2024 showed a calcified and thickened aortic valve with restricted leaflet mobility.  The mean gradient was 26 mmHg with a peak of 47 mmHg and a valve area by VTI of 0.7 cm suggesting low-flow/low gradient severe aortic stenosis.  Stroke-volume index was low at 26 and dimensionless index was 0.2.  Left ventricular ejection fraction was mildly reduced at 50 to 55%.  There is mild mitral regurgitation.   He is married and lives with his wife.  He continues to work full-time.  He reports some exertional fatigue and shortness of breath.  He has had no chest discomfort.  He denies any dizziness or syncope.  He has had bilateral lower extremity edema for some time.  He denies orthopnea and PND.   I had planned on doing coronary artery bypass graft surgery and aortic valve replacement last Friday but his CTA of the chest showed significant aortic root  and ascending aortic calcification extending out into the aortic arch.  I was concerned about being able to cross-clamp his aorta and open up the aorta to be able to replace the aortic valve.  I decided to postpone his surgical procedure and discuss him at TAVR conference.  The consensus opinion was that he has severe left main and multivessel coronary disease with severe calcification of the left main extending into the proximal left circumflex and LAD which would make percutaneous intervention risky.  It was also felt that this type of intervention would not be possible with a transcatheter valve in place.  The consensus was that it would be best to consider coronary bypass surgery, possibly off-pump or on pump and beating and then consider TAVR if open surgical AVR was not felt to be possible.      Past Medical History:  Diagnosis Date   Allergic rhinitis     Anemia     Asthma     CAD (coronary artery disease)      a. s/p PTCA/stenting of the mid LAD and mid RCA 04/06/12   Carotid bruit      r   Colon polyp     Diverticulosis     ED (erectile dysfunction)     Fatty liver     GERD (gastroesophageal reflux disease)     Gout     Hyperlipidemia  Hypertension     Obesity     Osteoarthritis     Renal insufficiency     Restless leg syndrome                 Past Surgical History:  Procedure Laterality Date   BACK SURGERY   2020   COLONOSCOPY   04/23/2016    Dr.Perry   INGUINAL HERNIA REPAIR Right 02/27/2016    Procedure: HERNIA REPAIR INGUINAL ADULT;  Surgeon: Donnice Bury, MD;  Location: [facility];  Service: General;  Laterality: Right;   INSERTION OF MESH Right 02/27/2016    Procedure: INSERTION OF MESH--IN AND OUT CATHETERIZATION PERFORMED AT END OF PROCEDURE TO DRAIN URINE.;  Surgeon: Donnice Bury, MD;  Location:  SURGERY CENTER;  Service: General;  Laterality: Right;   LEFT HEART CATH AND CORONARY ANGIOGRAPHY N/A 03/19/2024     Procedure: LEFT HEART CATH AND CORONARY ANGIOGRAPHY;  Surgeon: Anner Todd ORN, MD;  Location: [facility] INVASIVE CV LAB;  Service: Cardiovascular;  Laterality: N/A;   LYMPH NODE BIOPSY        right side of neck 1999 - benign   PERCUTANEOUS CORONARY STENT INTERVENTION (PCI-S) N/A 04/06/2012    Procedure: PERCUTANEOUS CORONARY STENT INTERVENTION (PCI-S);  Surgeon: Peter M Jordan, MD; Promus DES for 90% mid LAD and 95% mid RCA disease     UPPER GASTROINTESTINAL ENDOSCOPY   04/23/2016    Dr.Perry               Family History  Problem Relation Age of Onset   Hypertension Mother     Stroke Mother          Several    Stomach cancer Mother     Glaucoma Mother     Coronary artery disease Father          in his 58s   Heart disease Father     Heart attack Father     Hypertension Paternal Grandmother     Hypertension Paternal Grandfather     Colon cancer Neg Hx     Rectal cancer Neg Hx            Social History Social History  Social History         Tobacco Use   Smoking status: Former      Current packs/day: 0.00      Types: Cigarettes      Quit date: 06/04/1975      Years since quitting: 48.8   Smokeless tobacco: Never  Vaping Use   Vaping status: Never Used  Substance Use Topics   Alcohol use: No      Alcohol/week: 0.0 standard drinks of alcohol   Drug use: No              Current Outpatient Medications  Medication Sig Dispense Refill   albuterol  (VENTOLIN  HFA) 108 (90 Base) MCG/ACT inhaler Inhale 2 puffs into the lungs every 6 (six) hours as needed for wheezing or shortness of breath. 8.5 each 3   amLODipine  (NORVASC ) 10 MG tablet TAKE 1 TABLET BY MOUTH EVERY DAY 90 tablet 1   aspirin  81 MG chewable tablet Chew 81 mg by mouth daily.       Azelastine  HCl 137 MCG/SPRAY SOLN INSTILL 2 SPRAYS INTO BOTH NOSTRILS 2 TIMES DAILY AS DIRECTED (Patient taking differently: Place 1 spray into both nostrils daily as needed (Congestion).) 90 mL 1   cetirizine (ZYRTEC) 10 MG tablet Take  10 mg by mouth every  morning.       Cyanocobalamin  (VITAMIN B 12 PO) Take 1 tablet by mouth daily.        esomeprazole  (NEXIUM ) 40 MG capsule TAKE 1 CAPSULE BY MOUTH DAILY BEFORE A MEAL 90 capsule 2   ezetimibe  (ZETIA ) 10 MG tablet TAKE 1 TABLET BY MOUTH EVERY DAY 90 tablet 3   FeFum-FePoly-FA-B Cmp-C-Biot (INTEGRA PLUS ) CAPS TAKE 1 CAPSULE BY MOUTH DAILY 90 capsule 3   fluticasone  (FLONASE ) 50 MCG/ACT nasal spray Place 2 sprays into both nostrils 2 (two) times daily. (Patient taking differently: Place 2 sprays into both nostrils 2 (two) times daily as needed for rhinitis.) 16 g 6   furosemide  (LASIX ) 20 MG tablet Take 1 tablet (20 mg total) by mouth daily. 90 tablet 3   hydrALAZINE  (APRESOLINE ) 50 MG tablet TAKE 2 TABLETS BY MOUTH 3 TIMES A DAY 540 tablet 1   LORazepam  (ATIVAN ) 0.5 MG tablet Take 0.5 mg daily as needed 30 tablet 3   losartan  (COZAAR ) 25 MG tablet Take 1 tablet (25 mg total) by mouth daily. (Patient taking differently: Take 25 mg by mouth 2 (two) times daily.) 90 tablet 3   LUMIGAN 0.01 % SOLN Place 1 drop into both eyes at bedtime.       metoprolol  succinate (TOPROL -XL) 50 MG 24 hr tablet TAKE 1 TABLET EVERY DAY WITH OR IMMEDIATELY FOLLOWING A MEAL 90 tablet 2   montelukast  (SINGULAIR ) 10 MG tablet TAKE 1 TABLET BY MOUTH EVERYDAY AT BEDTIME 90 tablet 3   Multiple Vitamin (MULTIVITAMIN WITH MINERALS) TABS tablet Take 1 tablet by mouth daily. Adult 50+       nitroGLYCERIN  (NITROSTAT ) 0.4 MG SL tablet Place 1 tablet (0.4 mg total) under the tongue every 5 (five) minutes as needed for chest pain (up to 3 doses. not within 24hrs of Viagra ). 25 tablet 6   potassium chloride  SA (KLOR-CON  M) 20 MEQ tablet Take 1 tablet (20 mEq total) by mouth daily. 90 tablet 3   pravastatin  (PRAVACHOL ) 10 MG tablet TAKE 1 TABLET EVERY OTHER DAY, ALTERNATING WITH 2 TABLETS. 135 tablet 1   traMADol  (ULTRAM ) 50 MG tablet TAKE 1 TABLET BY MOUTH EVERY 12 HOURS. (Patient taking differently: Take 50 mg by mouth  every 12 (twelve) hours as needed for moderate pain (pain score 4-6) or severe pain (pain score 7-10).) 60 tablet 5      No current facility-administered medications for this visit.        Allergies       Allergies  Allergen Reactions   Prednisone Shortness Of Breath and Swelling   Rosuvastatin  Other (See Comments)   Crestor  [Rosuvastatin  Calcium ] Other (See Comments)      myalgia   Gabapentin  Other (See Comments)   Lyrica  [Pregabalin ]        swelling        Review of Systems  Constitutional:  Positive for fatigue. Negative for activity change.  HENT:  Negative for dental problem.   Eyes: Negative.   Respiratory:  Positive for shortness of breath.   Cardiovascular:  Positive for leg swelling. Negative for chest pain and palpitations.  Gastrointestinal: Negative.   Endocrine: Negative.   Genitourinary: Negative.   Musculoskeletal:  Positive for arthralgias.  Skin: Negative.   Allergic/Immunologic: Negative.   Neurological:  Negative for dizziness and syncope.  Hematological: Negative.   Psychiatric/Behavioral: Negative.       BP 115/66   Pulse 80   Resp 20   Ht 5' 7 (1.702 m)  Wt 245 lb (111.1 kg)   SpO2 98% Comment: RA  BMI 38.37 kg/m  Physical Exam Constitutional:      Appearance: Normal appearance. He is obese.  HENT:     Head: Normocephalic and atraumatic.     Mouth/Throat:     Mouth: Mucous membranes are moist.     Pharynx: Oropharynx is clear.  Eyes:     Conjunctiva/sclera: Conjunctivae normal.     Pupils: Pupils are equal, round, and reactive to light.  Cardiovascular:     Rate and Rhythm: Normal rate and regular rhythm.     Pulses: Normal pulses.     Heart sounds: Murmur heard.     Comments: 3/6 systolic murmur along the right sternal border.  There is no diastolic murmur. Pulmonary:     Effort: Pulmonary effort is normal.     Breath sounds: Normal breath sounds.  Abdominal:     General: There is no distension.     Tenderness: There is no  abdominal tenderness.  Musculoskeletal:        General: Swelling present.  Skin:    General: Skin is warm and dry.  Neurological:     General: No focal deficit present.     Mental Status: He is alert and oriented to person, place, and time.  Psychiatric:        Mood and Affect: Mood normal.        Behavior: Behavior normal.       Diagnostic Tests:     ECHOCARDIOGRAM REPORT       Patient Name:   Todd Spears Date of Exam: 04/13/2024  Medical Rec #:  [id number]      Height:       67.0 in  Accession #:    [id number]     Weight:       246.8 lb  Date of Birth:  24-May-1946       BSA:          2.211 m  Patient Age:    78 years       BP:           122/66 mmHg  Patient Gender: M              HR:           69 bpm.  Exam Location:  [address]   Procedure: 2D Echo, Cardiac Doppler and Color Doppler (Both Spectral and  Color            Flow Doppler were utilized during procedure).   Indications:    I35.0 AS    History:        Patient has prior history of Echocardiogram examinations,  most                 recent 04/08/2023. CAD, AS; Risk Factors:Hypertension,                  Dyslipidemia, Obesity and Former Smoker.    Sonographer:    Elsie Bohr RDCS  Referring Phys: [phone] PETER M JORDAN   IMPRESSIONS     1. Left ventricular ejection fraction, by estimation, is 50 to 55%. The  left ventricle has low normal function. The left ventricle has no regional  wall motion abnormalities. Left ventricular diastolic parameters are  consistent with Grade II diastolic  dysfunction (pseudonormalization). Elevated left atrial pressure.   2. Right ventricular systolic function is mildly reduced. The right  ventricular size is mildly enlarged. There is  moderately elevated  pulmonary artery systolic pressure.   3. The mitral valve is normal in structure. Mild mitral valve  regurgitation. No evidence of mitral stenosis.   4. Suspect Parodoxical Normal EF Low flow Low gradient Severe Aortic   stenosis. The aortic valve is calcified. There is mild calcification of  the aortic valve. There is mild thickening of the aortic valve. Aortic  valve regurgitation is not visualized.  Suspect Parodoxical Normal EF Low flow Low gradient Severe Aortic  stenosis. Aortic valve area, by VTI measures 0.70 cm. Aortic valve mean  gradient measures 26.0 mmHg. Aortic valve Vmax measures 3.44 m/s.   5. The inferior vena cava is normal in size with <50% respiratory  variability, suggesting right atrial pressure of 8 mmHg.   FINDINGS   Left Ventricle: Left ventricular ejection fraction, by estimation, is 50  to 55%. The left ventricle has low normal function. The left ventricle has  no regional wall motion abnormalities. The left ventricular internal  cavity size was normal in size.  There is no left ventricular hypertrophy. Left ventricular diastolic  parameters are consistent with Grade II diastolic dysfunction  (pseudonormalization). Elevated left atrial pressure.   Right Ventricle: The right ventricular size is mildly enlarged. No  increase in right ventricular wall thickness. Right ventricular systolic  function is mildly reduced. There is moderately elevated pulmonary artery  systolic pressure. The tricuspid  regurgitant velocity is 3.21 m/s, and with an assumed right atrial  pressure of 8 mmHg, the estimated right ventricular systolic pressure is  49.2 mmHg.   Left Atrium: Left atrial size was normal in size.   Right Atrium: Right atrial size was normal in size.   Pericardium: There is no evidence of pericardial effusion.   Mitral Valve: The mitral valve is normal in structure. Mild mitral valve  regurgitation. No evidence of mitral valve stenosis.   Tricuspid Valve: The tricuspid valve is normal in structure. Tricuspid  valve regurgitation is mild . No evidence of tricuspid stenosis.   Aortic Valve: Suspect Parodoxical Normal EF Low flow Low gradient Severe  Aortic stenosis. The  aortic valve is calcified. There is mild  calcification of the aortic valve. There is mild thickening of the aortic  valve. There is mild aortic valve annular  calcification. Aortic valve regurgitation is not visualized. Suspect  Parodoxical Normal EF Low flow Low gradient Severe Aortic stenosis. Aortic  valve mean gradient measures 26.0 mmHg. Aortic valve peak gradient  measures 47.3 mmHg. Aortic valve area, by  VTI measures 0.70 cm.   Pulmonic Valve: The pulmonic valve was normal in structure. Pulmonic valve  regurgitation is not visualized. No evidence of pulmonic stenosis.   Aorta: The aortic root is normal in size and structure.   Venous: The inferior vena cava is normal in size with less than 50%  respiratory variability, suggesting right atrial pressure of 8 mmHg.   IAS/Shunts: No atrial level shunt detected by color flow Doppler.     LEFT VENTRICLE  PLAX 2D  LVIDd:         5.30 cm   Diastology  LVIDs:         3.80 cm   LV e' medial:    4.40 cm/s  LV PW:         1.00 cm   LV E/e' medial:  28.4  LV IVS:        1.40 cm   LV e' lateral:   8.59 cm/s  LVOT diam:  2.10 cm   LV E/e' lateral: 14.6  LV SV:         57  LV SV Index:   26  LVOT Area:     3.46 cm     RIGHT VENTRICLE            IVC  RV S prime:     7.90 cm/s  IVC diam: 2.10 cm  TAPSE (M-mode): 1.5 cm  RVSP:           44.2 mmHg   LEFT ATRIUM              Index        RIGHT ATRIUM           Index  LA diam:        5.10 cm  2.31 cm/m   RA Pressure: 3.00 mmHg  LA Vol (A2C):   107.0 ml 48.39 ml/m  RA Area:     16.30 cm  LA Vol (A4C):   111.0 ml 50.20 ml/m  RA Volume:   48.00 ml  21.71 ml/m  LA Biplane Vol: 115.0 ml 52.01 ml/m   AORTIC VALVE  AV Area (Vmax):    0.73 cm  AV Area (Vmean):   0.74 cm  AV Area (VTI):     0.70 cm  AV Vmax:           344.00 cm/s  AV Vmean:          237.000 cm/s  AV VTI:            0.819 m  AV Peak Grad:      47.3 mmHg  AV Mean Grad:      26.0 mmHg  LVOT Vmax:         72.80  cm/s  LVOT Vmean:        50.300 cm/s  LVOT VTI:          0.165 m  LVOT/AV VTI ratio: 0.20    AORTA  Ao Root diam: 3.20 cm  Ao Asc diam:  3.10 cm   MITRAL VALVE                TRICUSPID VALVE  MV Area (PHT): 3.97 cm     TR Peak grad:   41.2 mmHg  MV Decel Time: 191 msec     TR Vmax:        321.00 cm/s  MV E velocity: 125.00 cm/s  Estimated RAP:  3.00 mmHg  MV A velocity: 95.90 cm/s   RVSP:           44.2 mmHg  MV E/A ratio:  1.30                              SHUNTS                              Systemic VTI:  0.16 m                              Systemic Diam: 2.10 cm   Kardie Tobb DO  Electronically signed by Dub Huntsman DO  Signature Date/Time: 04/13/2024/2:46:53 PM        Final      rocedures   LEFT HEART CATH AND CORONARY ANGIOGRAPHY    Conclusion       Ost RCA  to Prox RCA lesion is 100% stenosed.  Prox RCA to Mid RCA lesion is 100% stenosed.  The RCA fills fills from left to right collaterals.   Ost LM to Ost LAD lesion is 50% stenosed with 80% stenosed side branch in Ost Cx to Prox Cx.  Ostial ramus lesion is 70% stenosed.   Mid LAD-1 lesion is 55% stenosed. Mid LAD-2 lesion is 50% stenosed.  Previously placed Mid LAD-3 stent of unknown type is widely patent.   In the absence of any other complications or medical issues, we expect the patient to be ready for discharge from a cath perspective on 03/19/2024.   Plan will be to discussed with Dr. Jordan and consider if he would be a potential candidate for CVTS consultation.  Need to reassess his symptoms.  He does have echocardiographic  evidence of moderate to severe AS.  (24 mmHg per cath)   Recommend Aspirin  81mg  daily for moderate CAD.         100% flush occlusion of the RCA with retrograde filling from left-to-right collaterals filling all the way back to the occlusion. Eccentric Left Main Disease, difficult to interpret stenosis, cannot exclude up to 50%.  The LAD gives off a major diagonal branch just after the  diagonal branch there is a focal 50% stenosis followed by another 50% stenosis. After large septal perforator there is a widely patent stent. The LAD reaches down around the apex and has mild diffuse luminal irregularities.. Bifurcation ostial LCx and RI 70 % RI, 80 % LCx stenosis.  Very torturous aorta iliac, abdominal aortic and aortic arch/innominate artery vessels making it very difficult to manipulate catheters either from femoral or radial access.  Would not recommend right radial access in the future.  LVEDP 24 mmHg.      RECOMMENDATIONS   In the absence of any other complications or medical issues, we expect the patient to be ready for discharge from a cath perspective on 03/19/2024.   Plan will be to discussed with Dr. Jordan and consider if he would be a potential candidate for CVTS consultation.  Need to reassess his symptoms.  He does have echocardiographic  evidence of moderate to severe AS.  (24 mmHg per cath)   Recommend Aspirin  81mg  daily for moderate CAD.       Todd Clay, MD     Recommendations       Antiplatelet/Anticoag Recommend Aspirin  81mg  daily for moderate CAD.  Discharge Date In the absence of any other complications or medical issues, we expect the patient to be ready for discharge from a cath perspective on 03/19/2024. Plan will be to discussed with Dr. Jordan and consider if he would be a potential candidate for CVTS consultation.  Need to reassess his symptoms.  He does have echocardiographic  evidence of moderate to severe AS.  (24 mmHg per cath)    Procedural Details   Technical Details Time Out: Verified patient identification, verified procedure, site/side was marked, verified correct patient position, special equipment/implants available, medications/allergies/relevent history reviewed, required imaging and test results available. Performed.  Access:  RIGHT radial Artery: 6 Fr sheath -- Seldinger technique using Micropuncture Kit -- Direct  ultrasound guidance used.  Permanent image obtained and placed on chart. -- 10 mL radial cocktail IA; 5500 units IV Heparin  => I was not able to reach the vessels using standard catheters there was too much tortuosity in the innominate to subclavian artery.  I therefore changed the regular sheath for a 6 French 90  cm destination sheath, but despite use of this sheath I was still not able to engage coronaries.  I therefore aborted radial access, the destination sheath was removed and TR band applied.  We then turned attention to the femoral access *Right common Femoral Artery: 5 Fr Sheath -direct ultrasound and fluoroscopically guided modified Seldinger Technique using 5 French micropuncture kit. --I then exchanged the short sheath for a 25 cm sheath and then subsequently to a 6 French 45 cm destination sheath due to aortoiliac tortuosity.   Left Heart Catheterization: 5 and 6 Fr Catheters advanced or exchanged over a J-wire under direct fluoroscopic guidance into the ascending aorta; TIG 4.0 catheter advanced first.  * LV Hemodynamics (no LV Gram): Via femoral access-5 French AL-1 guide catheter. * Left Coronary Artery Cineangiography: Unable to engage via radial access despite the use of 90 cm destination sheath.  Finally using a 6 French XB LAD 3.5 guide catheter through a 45 cm femoral destination sheath I was able to engage the Left Main.  * Right Coronary Artery Cineangiography: Multiple catheters were tried from both the radial access and femoral access including JR4, AR mod, 3 DRC and AL-1.  We were not able to inject and fill the vessel-appeared to be occluded.  Review of initial angiography revealed: RCA CTO filling from left to right collaterals with bifurcation RI/LCx stenosis and moderate LM/LAD disease.  Upon completion of Angiogaphy, the catheter was removed completely out of the body over a wire, without complication.  Femoral arterial sheath was exchanged from the 6 French 45 cm  destination sheath back to a 6 French 25 cm sheath, sutured in place.   It will be removed in the  with manual pressure for hemostasis.    Radial sheath removed in the Cardiac Catheterization lab with TR Band placed for hemostasis.  TR Band: 1315  Hours; 13 mL air => reduced to 9 mL x 1440  MEDICATIONS SQ Lidocaine  4 mL radial access; 16 mL for femoral access Radial Cocktail: 3 mg Verapmil in 10 mL NS Heparin : total 8500 Units   Estimated blood loss <50 mL.   During this procedure medications were administered to achieve and maintain moderate conscious sedation while the patient's heart rate, blood pressure, and oxygen saturation were continuously monitored and I was present face-to-face 100% of this time. Adam Raynold RN  Mabel Price Rad Tech and [employer] are independent, trained observers who assisted in the monitoring of the patient's level of consciousness.    Medications (Filter: Administrations occurring from 1148 to 1442 on 03/19/24)  important  Continuous medications are totaled by the amount administered until 03/19/24 1442.    fentaNYL  (SUBLIMAZE ) injection (mcg)  Total dose: 75 mcg Date/Time Rate/Dose/Volume Action    03/19/24 1211 25 mcg Given    1252 25 mcg Given    1401 25 mcg Given    midazolam  PF (VERSED ) injection (mg)  Total dose: 2 mg Date/Time Rate/Dose/Volume Action    03/19/24 1211 1 mg Given    1253 1 mg Given    lidocaine  (PF) (XYLOCAINE ) 1 % injection (mL)  Total volume: 4 mL Date/Time Rate/Dose/Volume Action    03/19/24 1211 2 mL Given    1317 2 mL Given    Heparin  (Porcine) in NaCl 1000-0.9 UT/500ML-% SOLN (mL)  Total volume: 2,000 mL Date/Time Rate/Dose/Volume Action    03/19/24 1212 500 mL Given    1212 500 mL Given    1237 500 mL Given    1324  500 mL Given    Radial Cocktail/Verapamil  only (mL)  Total volume: 10 mL Date/Time Rate/Dose/Volume Action    03/19/24 1213 10 mL Given    heparin  sodium (porcine) injection  (Units)  Total dose: 8,500 Units Date/Time Rate/Dose/Volume Action    03/19/24 1221 5,500 Units Given    1251 3,000 Units Given    0.9 %  sodium chloride  infusion (mL/hr)  Total dose: Cannot be calculated* *Continuous medication not stopped within the calculation time range. Date/Time Rate/Dose/Volume Action    03/19/24 1406 50 mL/hr New Bag/Given    iohexol  (OMNIPAQUE ) 350 MG/ML injection (mL)  Total volume: 210 mL Date/Time Rate/Dose/Volume Action    03/19/24 1428 210 mL Given      Sedation Time   Sedation Time Physician-1: 2 hours 15 minutes 57 seconds Contrast        Administrations occurring from 1148 to 1442 on 03/19/24:  Medication Name Total Dose  iohexol  (OMNIPAQUE ) 350 MG/ML injection 210 mL    Radiation/Fluoro   Fluoro time: 54.8 (min) DAP: 241554 (mGycm2) Cumulative Air Kerma: 3281 (mGy) Complications   Complications documented before study signed (03/19/2024  3:17 PM)    No complications were associated with this study.  Documented by Luella Richerd HERO, RCIS - 03/19/2024  2:29 PM      Coronary Findings   Diagnostic Dominance: Right Left Main  Ost LM to Ost LAD lesion is 50% stenosed with 80% stenosed side branch in Ost Cx to Prox Cx.    Left Anterior Descending  There is mild diffuse disease throughout the vessel.  Mid LAD-1 lesion is 55% stenosed.  Mid LAD-2 lesion is 50% stenosed.  Previously placed Mid LAD-3 stent of unknown type is widely patent. The lesion is focal.    First Diagonal Branch  Vessel is moderate in size.    Third Diagonal Branch  Vessel is small in size.    Ramus Intermedius  Vessel is moderate in size.  Ramus lesion is 70% stenosed.    Left Circumflex  Vessel is large.    Left Posterior Atrioventricular Artery  Vessel is small in size.    Right Coronary Artery  Ost RCA to Prox RCA lesion is 100% stenosed. The lesion is chronically occluded with left-to-right collateral flow. The lesion is severely calcified.   Prox RCA to Mid RCA lesion is 100% stenosed. The lesion was previously treated using a stent (unknown type) over 2 years ago. Previously placed stent displays restenosis.    Right Ventricular Branch  Vessel is small in size.    Right Posterior Descending Artery  Vessel is moderate in size.  Collaterals  RPDA filled by collaterals from 3rd Diag.       Second Right Posterolateral Branch  Collaterals  2nd RPL filled by collaterals from 1st Mrg.       Third Right Posterolateral Branch  Collaterals  3rd RPL filled by collaterals from 1st LPL.       Intervention    No interventions have been documented.    Coronary Diagrams   Diagnostic Dominance: Right  Intervention    Implants    No implant documentation for this case.    Syngo Images    Show images for CARDIAC CATHETERIZATION Images on Long Term Storage    Show images for Erskine, Steinfeldt to Procedure Log   Procedure Log    Hemo Data   Flowsheet Row Most Recent Value  Aortic Mean Gradient 24.1 mmHg  Aortic Peak Gradient 29.9 mmHg  AO  Systolic Pressure 119 mmHg  AO Diastolic Pressure 68 mmHg  AO Mean 89 mmHg  LV Systolic Pressure 158 mmHg  LV Diastolic Pressure 5 mmHg  LV EDP 21 mmHg  Arterial Occlusion Pressure Extended Systolic Pressure 134 mmHg  Arterial Occlusion Pressure Extended Diastolic Pressure 58 mmHg  Arterial Occlusion Pressure Extended Mean Pressure 90 mmHg  Left Ventricular Apex Extended Systolic Pressure 159 mmHg  LVp Diastolic Pressure 6 mmHg  Left Ventricular Apex Extended EDP Pressure 23 mmHg   Impression:   I reviewed the CTA images with the patient and his wife and answered all their questions.  I think the best option for this patient is going to be to proceed with coronary bypass graft surgery either on pump with the heart beating or arrested depending on the ability to cross-clamp the aorta.  If the proximal ascending aorta appears suitable to open then I would plan to proceed  with aortic valve replacement at that time.  If I do not feel it is safe to open the aorta then we will plan to perform TAVR later.  He could have some hemodynamic difficulties postop with persistent severe aortic stenosis present after coronary bypass graft surgery.  We will get a gated cardiac CTA of the chest to be sure that he is a potential candidate for TAVR later if necessary. I discussed the operative procedure with the patient and his wife including alternatives, benefits and risks; including but not limited to bleeding, blood transfusion, infection, stroke, myocardial infarction, graft failure, heart block requiring a permanent pacemaker, organ dysfunction, and death.  Todd Spears understands and agrees to proceed.     Plan:   Coronary bypass graft surgery and possible open aortic valve replacement.     Dorise MARLA Fellers, MD [facility] [phone]

## 2024-04-26 ENCOUNTER — Encounter (HOSPITAL_COMMUNITY): Payer: Self-pay | Admitting: Surgery

## 2024-04-26 ENCOUNTER — Inpatient Hospital Stay (HOSPITAL_COMMUNITY)

## 2024-04-26 ENCOUNTER — Inpatient Hospital Stay (HOSPITAL_COMMUNITY)
Admission: RE | Disposition: A | Discharge status: Home Health | Payer: Self-pay | Source: Home / Self Care | Attending: Surgery

## 2024-04-26 ENCOUNTER — Inpatient Hospital Stay (HOSPITAL_COMMUNITY)
Admission: RE | Admit: 2024-04-26 | Discharge: 2024-05-02 | DRG: 236 | Disposition: A | Discharge status: Home Health | Attending: Surgery | Admitting: Surgery

## 2024-04-26 ENCOUNTER — Inpatient Hospital Stay (HOSPITAL_COMMUNITY): Payer: Self-pay | Admitting: Vascular Surgery

## 2024-04-26 DIAGNOSIS — R918 Other nonspecific abnormal finding of lung field: Secondary | ICD-10-CM | POA: Diagnosis not present

## 2024-04-26 DIAGNOSIS — R1310 Dysphagia, unspecified: Secondary | ICD-10-CM | POA: Diagnosis not present

## 2024-04-26 DIAGNOSIS — I480 Paroxysmal atrial fibrillation: Secondary | ICD-10-CM | POA: Diagnosis present

## 2024-04-26 DIAGNOSIS — I272 Pulmonary hypertension, unspecified: Secondary | ICD-10-CM | POA: Diagnosis not present

## 2024-04-26 DIAGNOSIS — Z452 Encounter for adjustment and management of vascular access device: Secondary | ICD-10-CM | POA: Diagnosis not present

## 2024-04-26 DIAGNOSIS — Z823 Family history of stroke: Secondary | ICD-10-CM | POA: Diagnosis not present

## 2024-04-26 DIAGNOSIS — Z9889 Other specified postprocedural states: Secondary | ICD-10-CM | POA: Diagnosis not present

## 2024-04-26 DIAGNOSIS — Z48812 Encounter for surgical aftercare following surgery on the circulatory system: Secondary | ICD-10-CM | POA: Diagnosis not present

## 2024-04-26 DIAGNOSIS — Z860100 Personal history of colon polyps, unspecified: Secondary | ICD-10-CM

## 2024-04-26 DIAGNOSIS — J90 Pleural effusion, not elsewhere classified: Secondary | ICD-10-CM | POA: Diagnosis not present

## 2024-04-26 DIAGNOSIS — Z87891 Personal history of nicotine dependence: Secondary | ICD-10-CM

## 2024-04-26 DIAGNOSIS — Z4682 Encounter for fitting and adjustment of non-vascular catheter: Secondary | ICD-10-CM | POA: Diagnosis not present

## 2024-04-26 DIAGNOSIS — I2510 Atherosclerotic heart disease of native coronary artery without angina pectoris: Principal | ICD-10-CM | POA: Diagnosis present

## 2024-04-26 DIAGNOSIS — Z951 Presence of aortocoronary bypass graft: Principal | ICD-10-CM

## 2024-04-26 DIAGNOSIS — J9811 Atelectasis: Secondary | ICD-10-CM | POA: Diagnosis not present

## 2024-04-26 DIAGNOSIS — Z83511 Family history of glaucoma: Secondary | ICD-10-CM | POA: Diagnosis not present

## 2024-04-26 DIAGNOSIS — I440 Atrioventricular block, first degree: Secondary | ICD-10-CM | POA: Diagnosis not present

## 2024-04-26 DIAGNOSIS — Z79899 Other long term (current) drug therapy: Secondary | ICD-10-CM

## 2024-04-26 DIAGNOSIS — I10 Essential (primary) hypertension: Secondary | ICD-10-CM

## 2024-04-26 DIAGNOSIS — Z955 Presence of coronary angioplasty implant and graft: Secondary | ICD-10-CM | POA: Diagnosis not present

## 2024-04-26 DIAGNOSIS — J939 Pneumothorax, unspecified: Secondary | ICD-10-CM | POA: Diagnosis not present

## 2024-04-26 DIAGNOSIS — I088 Other rheumatic multiple valve diseases: Secondary | ICD-10-CM | POA: Diagnosis not present

## 2024-04-26 DIAGNOSIS — I129 Hypertensive chronic kidney disease with stage 1 through stage 4 chronic kidney disease, or unspecified chronic kidney disease: Secondary | ICD-10-CM | POA: Diagnosis present

## 2024-04-26 DIAGNOSIS — I350 Nonrheumatic aortic (valve) stenosis: Secondary | ICD-10-CM | POA: Diagnosis not present

## 2024-04-26 DIAGNOSIS — R3912 Poor urinary stream: Secondary | ICD-10-CM | POA: Diagnosis present

## 2024-04-26 DIAGNOSIS — E785 Hyperlipidemia, unspecified: Secondary | ICD-10-CM | POA: Diagnosis present

## 2024-04-26 DIAGNOSIS — I700 Atherosclerosis of aorta: Secondary | ICD-10-CM | POA: Diagnosis present

## 2024-04-26 DIAGNOSIS — E669 Obesity, unspecified: Secondary | ICD-10-CM | POA: Diagnosis not present

## 2024-04-26 DIAGNOSIS — M1990 Unspecified osteoarthritis, unspecified site: Secondary | ICD-10-CM | POA: Diagnosis not present

## 2024-04-26 DIAGNOSIS — Z800 Family history of malignant neoplasm of digestive organs: Secondary | ICD-10-CM

## 2024-04-26 DIAGNOSIS — J984 Other disorders of lung: Secondary | ICD-10-CM | POA: Diagnosis not present

## 2024-04-26 DIAGNOSIS — Z6838 Body mass index (BMI) 38.0-38.9, adult: Secondary | ICD-10-CM | POA: Diagnosis not present

## 2024-04-26 DIAGNOSIS — J45909 Unspecified asthma, uncomplicated: Secondary | ICD-10-CM | POA: Diagnosis not present

## 2024-04-26 DIAGNOSIS — Z7982 Long term (current) use of aspirin: Secondary | ICD-10-CM | POA: Diagnosis not present

## 2024-04-26 DIAGNOSIS — E8770 Fluid overload, unspecified: Secondary | ICD-10-CM | POA: Diagnosis present

## 2024-04-26 DIAGNOSIS — R001 Bradycardia, unspecified: Secondary | ICD-10-CM | POA: Diagnosis not present

## 2024-04-26 DIAGNOSIS — R0989 Other specified symptoms and signs involving the circulatory and respiratory systems: Secondary | ICD-10-CM | POA: Diagnosis not present

## 2024-04-26 DIAGNOSIS — Z8249 Family history of ischemic heart disease and other diseases of the circulatory system: Secondary | ICD-10-CM | POA: Diagnosis not present

## 2024-04-26 DIAGNOSIS — N1831 Chronic kidney disease, stage 3a: Secondary | ICD-10-CM | POA: Diagnosis present

## 2024-04-26 DIAGNOSIS — R39198 Other difficulties with micturition: Secondary | ICD-10-CM | POA: Diagnosis present

## 2024-04-26 DIAGNOSIS — Z888 Allergy status to other drugs, medicaments and biological substances status: Secondary | ICD-10-CM

## 2024-04-26 DIAGNOSIS — K219 Gastro-esophageal reflux disease without esophagitis: Secondary | ICD-10-CM | POA: Diagnosis present

## 2024-04-26 DIAGNOSIS — I340 Nonrheumatic mitral (valve) insufficiency: Secondary | ICD-10-CM | POA: Diagnosis not present

## 2024-04-26 DIAGNOSIS — N182 Chronic kidney disease, stage 2 (mild): Secondary | ICD-10-CM | POA: Diagnosis not present

## 2024-04-26 DIAGNOSIS — I2584 Coronary atherosclerosis due to calcified coronary lesion: Secondary | ICD-10-CM | POA: Diagnosis not present

## 2024-04-26 DIAGNOSIS — I517 Cardiomegaly: Secondary | ICD-10-CM | POA: Diagnosis not present

## 2024-04-26 HISTORY — PX: INTRAOPERATIVE TRANSESOPHAGEAL ECHOCARDIOGRAM: SHX5062

## 2024-04-26 HISTORY — PX: AORTIC VALVE REPLACEMENT: SHX41

## 2024-04-26 HISTORY — PX: CORONARY ARTERY BYPASS GRAFT: SHX141

## 2024-04-26 LAB — POCT I-STAT 7, (LYTES, BLD GAS, ICA,H+H)
Acid-base deficit: 3 mmol/L — ABNORMAL HIGH (ref 0.0–2.0)
Acid-base deficit: 4 mmol/L — ABNORMAL HIGH (ref 0.0–2.0)
Acid-base deficit: 5 mmol/L — ABNORMAL HIGH (ref 0.0–2.0)
Acid-base deficit: 4 mmol/L — ABNORMAL HIGH (ref 0.0–2.0)
Acid-base deficit: 4 mmol/L — ABNORMAL HIGH (ref 0.0–2.0)
Acid-base deficit: 5 mmol/L — ABNORMAL HIGH (ref 0.0–2.0)
Bicarbonate: 22.5 mmol/L (ref 20.0–28.0)
Bicarbonate: 19.4 mmol/L — ABNORMAL LOW (ref 20.0–28.0)
Bicarbonate: 20.2 mmol/L (ref 20.0–28.0)
Bicarbonate: 22 mmol/L (ref 20.0–28.0)
Bicarbonate: 20.9 mmol/L (ref 20.0–28.0)
Bicarbonate: 20.9 mmol/L (ref 20.0–28.0)
Calcium, Ion: 1.11 mmol/L — ABNORMAL LOW (ref 1.15–1.40)
Calcium, Ion: 1.12 mmol/L — ABNORMAL LOW (ref 1.15–1.40)
Calcium, Ion: 1.12 mmol/L — ABNORMAL LOW (ref 1.15–1.40)
Calcium, Ion: 1.22 mmol/L (ref 1.15–1.40)
Calcium, Ion: 1.15 mmol/L (ref 1.15–1.40)
Calcium, Ion: 1.16 mmol/L (ref 1.15–1.40)
HCT: 24 % — ABNORMAL LOW (ref 39.0–52.0)
HCT: 25 % — ABNORMAL LOW (ref 39.0–52.0)
HCT: 27 % — ABNORMAL LOW (ref 39.0–52.0)
HCT: 30 % — ABNORMAL LOW (ref 39.0–52.0)
HCT: 29 % — ABNORMAL LOW (ref 39.0–52.0)
HCT: 30 % — ABNORMAL LOW (ref 39.0–52.0)
Hemoglobin: 8.2 g/dL — ABNORMAL LOW (ref 13.0–17.0)
Hemoglobin: 8.5 g/dL — ABNORMAL LOW (ref 13.0–17.0)
Hemoglobin: 9.2 g/dL — ABNORMAL LOW (ref 13.0–17.0)
Hemoglobin: 10.2 g/dL — ABNORMAL LOW (ref 13.0–17.0)
Hemoglobin: 9.9 g/dL — ABNORMAL LOW (ref 13.0–17.0)
Hemoglobin: 10.2 g/dL — ABNORMAL LOW (ref 13.0–17.0)
O2 Saturation: 100 %
O2 Saturation: 100 %
O2 Saturation: 97 %
O2 Saturation: 95 %
O2 Saturation: 97 %
O2 Saturation: 97 %
Patient temperature: 36.1
Patient temperature: 36.5
Patient temperature: 36.5
Potassium: 3.9 mmol/L (ref 3.5–5.1)
Potassium: 4.4 mmol/L (ref 3.5–5.1)
Potassium: 4.8 mmol/L (ref 3.5–5.1)
Potassium: 4.3 mmol/L (ref 3.5–5.1)
Potassium: 3.8 mmol/L (ref 3.5–5.1)
Potassium: 3.8 mmol/L (ref 3.5–5.1)
Sodium: 138 mmol/L (ref 135–145)
Sodium: 134 mmol/L — ABNORMAL LOW (ref 135–145)
Sodium: 136 mmol/L (ref 135–145)
Sodium: 137 mmol/L (ref 135–145)
Sodium: 136 mmol/L (ref 135–145)
Sodium: 138 mmol/L (ref 135–145)
TCO2: 24 mmol/L (ref 22–32)
TCO2: 20 mmol/L — ABNORMAL LOW (ref 22–32)
TCO2: 21 mmol/L — ABNORMAL LOW (ref 22–32)
TCO2: 23 mmol/L (ref 22–32)
TCO2: 22 mmol/L (ref 22–32)
TCO2: 22 mmol/L (ref 22–32)
pCO2 arterial: 39.2 mmHg (ref 32–48)
pCO2 arterial: 32.7 mmHg (ref 32–48)
pCO2 arterial: 33.6 mmHg (ref 32–48)
pCO2 arterial: 39.9 mmHg (ref 32–48)
pCO2 arterial: 34.8 mmHg (ref 32–48)
pCO2 arterial: 39.6 mmHg (ref 32–48)
pH, Arterial: 7.367 (ref 7.35–7.45)
pH, Arterial: 7.381 (ref 7.35–7.45)
pH, Arterial: 7.388 (ref 7.35–7.45)
pH, Arterial: 7.346 — ABNORMAL LOW (ref 7.35–7.45)
pH, Arterial: 7.384 (ref 7.35–7.45)
pH, Arterial: 7.327 — ABNORMAL LOW (ref 7.35–7.45)
pO2, Arterial: 366 mmHg — ABNORMAL HIGH (ref 83–108)
pO2, Arterial: 288 mmHg — ABNORMAL HIGH (ref 83–108)
pO2, Arterial: 93 mmHg (ref 83–108)
pO2, Arterial: 75 mmHg — ABNORMAL LOW (ref 83–108)
pO2, Arterial: 92 mmHg (ref 83–108)
pO2, Arterial: 94 mmHg (ref 83–108)

## 2024-04-26 LAB — BASIC METABOLIC PANEL WITH GFR
Anion gap: 14 (ref 5–15)
BUN: 22 mg/dL (ref 8–23)
CO2: 21 mmol/L — ABNORMAL LOW (ref 22–32)
Calcium: 8.1 mg/dL — ABNORMAL LOW (ref 8.9–10.3)
Chloride: 103 mmol/L (ref 98–111)
Creatinine, Ser: 1.46 mg/dL — ABNORMAL HIGH (ref 0.61–1.24)
GFR, Estimated: 49 mL/min — ABNORMAL LOW (ref >60)
Glucose, Bld: 169 mg/dL — ABNORMAL HIGH (ref 70–99)
Potassium: 3.8 mmol/L (ref 3.5–5.1)
Sodium: 138 mmol/L (ref 135–145)

## 2024-04-26 LAB — POCT I-STAT, CHEM 8
BUN: 21 mg/dL (ref 8–23)
BUN: 22 mg/dL (ref 8–23)
BUN: 21 mg/dL (ref 8–23)
BUN: 22 mg/dL (ref 8–23)
BUN: 21 mg/dL (ref 8–23)
Calcium, Ion: 1.22 mmol/L (ref 1.15–1.40)
Calcium, Ion: 1.26 mmol/L (ref 1.15–1.40)
Calcium, Ion: 1.12 mmol/L — ABNORMAL LOW (ref 1.15–1.40)
Calcium, Ion: 1.11 mmol/L — ABNORMAL LOW (ref 1.15–1.40)
Calcium, Ion: 1.1 mmol/L — ABNORMAL LOW (ref 1.15–1.40)
Chloride: 103 mmol/L (ref 98–111)
Chloride: 104 mmol/L (ref 98–111)
Chloride: 103 mmol/L (ref 98–111)
Chloride: 102 mmol/L (ref 98–111)
Chloride: 103 mmol/L (ref 98–111)
Creatinine, Ser: 1.3 mg/dL — ABNORMAL HIGH (ref 0.61–1.24)
Creatinine, Ser: 1.3 mg/dL — ABNORMAL HIGH (ref 0.61–1.24)
Creatinine, Ser: 1.2 mg/dL (ref 0.61–1.24)
Creatinine, Ser: 1.2 mg/dL (ref 0.61–1.24)
Creatinine, Ser: 1.3 mg/dL — ABNORMAL HIGH (ref 0.61–1.24)
Glucose, Bld: 149 mg/dL — ABNORMAL HIGH (ref 70–99)
Glucose, Bld: 129 mg/dL — ABNORMAL HIGH (ref 70–99)
Glucose, Bld: 149 mg/dL — ABNORMAL HIGH (ref 70–99)
Glucose, Bld: 211 mg/dL — ABNORMAL HIGH (ref 70–99)
Glucose, Bld: 210 mg/dL — ABNORMAL HIGH (ref 70–99)
HCT: 28 % — ABNORMAL LOW (ref 39.0–52.0)
HCT: 29 % — ABNORMAL LOW (ref 39.0–52.0)
HCT: 27 % — ABNORMAL LOW (ref 39.0–52.0)
HCT: 26 % — ABNORMAL LOW (ref 39.0–52.0)
HCT: 28 % — ABNORMAL LOW (ref 39.0–52.0)
Hemoglobin: 9.5 g/dL — ABNORMAL LOW (ref 13.0–17.0)
Hemoglobin: 9.9 g/dL — ABNORMAL LOW (ref 13.0–17.0)
Hemoglobin: 9.2 g/dL — ABNORMAL LOW (ref 13.0–17.0)
Hemoglobin: 8.8 g/dL — ABNORMAL LOW (ref 13.0–17.0)
Hemoglobin: 9.5 g/dL — ABNORMAL LOW (ref 13.0–17.0)
Potassium: 3.8 mmol/L (ref 3.5–5.1)
Potassium: 4.1 mmol/L (ref 3.5–5.1)
Potassium: 4.4 mmol/L (ref 3.5–5.1)
Potassium: 5 mmol/L (ref 3.5–5.1)
Potassium: 4.4 mmol/L (ref 3.5–5.1)
Sodium: 136 mmol/L (ref 135–145)
Sodium: 137 mmol/L (ref 135–145)
Sodium: 136 mmol/L (ref 135–145)
Sodium: 134 mmol/L — ABNORMAL LOW (ref 135–145)
Sodium: 135 mmol/L (ref 135–145)
TCO2: 21 mmol/L — ABNORMAL LOW (ref 22–32)
TCO2: 23 mmol/L (ref 22–32)
TCO2: 24 mmol/L (ref 22–32)
TCO2: 22 mmol/L (ref 22–32)
TCO2: 19 mmol/L — ABNORMAL LOW (ref 22–32)

## 2024-04-26 LAB — GLUCOSE, CAPILLARY
Glucose-Capillary: 158 mg/dL — ABNORMAL HIGH (ref 70–99)
Glucose-Capillary: 161 mg/dL — ABNORMAL HIGH (ref 70–99)
Glucose-Capillary: 157 mg/dL — ABNORMAL HIGH (ref 70–99)
Glucose-Capillary: 156 mg/dL — ABNORMAL HIGH (ref 70–99)
Glucose-Capillary: 148 mg/dL — ABNORMAL HIGH (ref 70–99)
Glucose-Capillary: 157 mg/dL — ABNORMAL HIGH (ref 70–99)
Glucose-Capillary: 169 mg/dL — ABNORMAL HIGH (ref 70–99)
Glucose-Capillary: 156 mg/dL — ABNORMAL HIGH (ref 70–99)
Glucose-Capillary: 152 mg/dL — ABNORMAL HIGH (ref 70–99)
Glucose-Capillary: 148 mg/dL — ABNORMAL HIGH (ref 70–99)

## 2024-04-26 LAB — CBC
HCT: 30.9 % — ABNORMAL LOW (ref 39.0–52.0)
HCT: 30.5 % — ABNORMAL LOW (ref 39.0–52.0)
Hemoglobin: 10 g/dL — ABNORMAL LOW (ref 13.0–17.0)
Hemoglobin: 10 g/dL — ABNORMAL LOW (ref 13.0–17.0)
MCH: 31.8 pg (ref 26.0–34.0)
MCH: 32.4 pg (ref 26.0–34.0)
MCHC: 32.4 g/dL (ref 30.0–36.0)
MCHC: 32.8 g/dL (ref 30.0–36.0)
MCV: 98.4 fL (ref 80.0–100.0)
MCV: 98.7 fL (ref 80.0–100.0)
Platelets: 196 K/uL (ref 150–400)
Platelets: 231 K/uL (ref 150–400)
RBC: 3.14 MIL/uL — ABNORMAL LOW (ref 4.22–5.81)
RBC: 3.09 MIL/uL — ABNORMAL LOW (ref 4.22–5.81)
RDW: 13.2 % (ref 11.5–15.5)
RDW: 13.4 % (ref 11.5–15.5)
WBC: 18.1 K/uL — ABNORMAL HIGH (ref 4.0–10.5)
WBC: 13.9 K/uL — ABNORMAL HIGH (ref 4.0–10.5)
nRBC: 0 % (ref 0.0–0.2)
nRBC: 0 % (ref 0.0–0.2)

## 2024-04-26 LAB — PREPARE RBC (CROSSMATCH)

## 2024-04-26 LAB — ECHO INTRAOPERATIVE TEE
AR max vel: 1.27 cm2
AV Area VTI: 1.23 cm2
AV Area mean vel: 1.19 cm2
AV Mean grad: 29 mmHg
AV Peak grad: 47.6 mmHg
Ao pk vel: 3.45 m/s
Height: 67 in
S' Lateral: 4.4 cm
Weight: 3936 [oz_av]

## 2024-04-26 LAB — PLATELET COUNT: Platelets: 158 K/uL (ref 150–400)

## 2024-04-26 LAB — POCT I-STAT EG7
Acid-base deficit: 3 mmol/L — ABNORMAL HIGH (ref 0.0–2.0)
Bicarbonate: 22.6 mmol/L (ref 20.0–28.0)
Calcium, Ion: 1.12 mmol/L — ABNORMAL LOW (ref 1.15–1.40)
HCT: 22 % — ABNORMAL LOW (ref 39.0–52.0)
Hemoglobin: 7.5 g/dL — ABNORMAL LOW (ref 13.0–17.0)
O2 Saturation: 88 %
Potassium: 5 mmol/L (ref 3.5–5.1)
Sodium: 137 mmol/L (ref 135–145)
TCO2: 24 mmol/L (ref 22–32)
pCO2, Ven: 40.8 mmHg — ABNORMAL LOW (ref 44–60)
pH, Ven: 7.351 (ref 7.25–7.43)
pO2, Ven: 57 mmHg — ABNORMAL HIGH (ref 32–45)

## 2024-04-26 LAB — HEMOGLOBIN AND HEMATOCRIT, BLOOD
HCT: 24.1 % — ABNORMAL LOW (ref 39.0–52.0)
Hemoglobin: 7.9 g/dL — ABNORMAL LOW (ref 13.0–17.0)

## 2024-04-26 LAB — PROTIME-INR
INR: 1.4 — ABNORMAL HIGH (ref 0.8–1.2)
Prothrombin Time: 17.5 s — ABNORMAL HIGH (ref 11.4–15.2)

## 2024-04-26 LAB — MAGNESIUM: Magnesium: 3.1 mg/dL — ABNORMAL HIGH (ref 1.7–2.4)

## 2024-04-26 LAB — APTT: aPTT: 31 s (ref 24–36)

## 2024-04-26 LAB — ABO/RH: ABO/RH(D): B NEG

## 2024-04-26 SURGERY — CORONARY ARTERY BYPASS GRAFTING (CABG)
Anesthesia: General | Site: Chest

## 2024-04-26 MED ORDER — ACETAMINOPHEN 500 MG PO TABS
1000.0000 mg | ORAL_TABLET | Freq: Four times a day (QID) | ORAL | Status: AC
  Administered 2024-04-27 – 2024-05-01 (×14): 1000 mg via ORAL
  Filled 2024-04-26 (×17): qty 2

## 2024-04-26 MED ORDER — ALBUTEROL SULFATE (2.5 MG/3ML) 0.083% IN NEBU: 2.5000 mg | INHALATION_SOLUTION | Freq: Four times a day (QID) | RESPIRATORY_TRACT | Status: DC | PRN

## 2024-04-26 MED ORDER — LACTATED RINGERS IV SOLN: INTRAVENOUS | Status: AC

## 2024-04-26 MED ORDER — MIDAZOLAM HCL (PF) 5 MG/ML IJ SOLN
INTRAMUSCULAR | Status: DC | PRN
  Administered 2024-04-26: 1 mg via INTRAVENOUS
  Administered 2024-04-26 (×2): 2 mg via INTRAVENOUS
  Administered 2024-04-26: 1 mg via INTRAVENOUS
  Administered 2024-04-26 (×2): 2 mg via INTRAVENOUS

## 2024-04-26 MED ORDER — DOCUSATE SODIUM 100 MG PO CAPS
200.0000 mg | ORAL_CAPSULE | Freq: Every day | ORAL | Status: DC
  Administered 2024-04-28 – 2024-04-30 (×3): 200 mg via ORAL
  Filled 2024-04-26 (×3): qty 2

## 2024-04-26 MED ORDER — PROPOFOL 10 MG/ML IV BOLUS
INTRAVENOUS | Status: AC
  Filled 2024-04-26: qty 20

## 2024-04-26 MED ORDER — MIDAZOLAM HCL (PF) 10 MG/2ML IJ SOLN
INTRAMUSCULAR | Status: AC
  Filled 2024-04-26: qty 2

## 2024-04-26 MED ORDER — PHENYLEPHRINE 80 MCG/ML (10ML) SYRINGE FOR IV PUSH (FOR BLOOD PRESSURE SUPPORT)
PREFILLED_SYRINGE | INTRAVENOUS | Status: DC | PRN
Start: 2024-04-26 — End: 2024-04-26
  Administered 2024-04-26: 80 ug via INTRAVENOUS
  Administered 2024-04-26: 160 ug via INTRAVENOUS

## 2024-04-26 MED ORDER — LORATADINE 10 MG PO TABS
10.0000 mg | ORAL_TABLET | Freq: Every day | ORAL | Status: DC
  Administered 2024-04-28 – 2024-05-02 (×5): 10 mg via ORAL
  Filled 2024-04-26 (×5): qty 1

## 2024-04-26 MED ORDER — PHENYLEPHRINE HCL-NACL 20-0.9 MG/250ML-% IV SOLN
0.0000 ug/min | INTRAVENOUS | Status: DC
  Administered 2024-04-26: 115 ug/min via INTRAVENOUS
  Administered 2024-04-26: 85 ug/min via INTRAVENOUS
  Filled 2024-04-26 (×2): qty 250

## 2024-04-26 MED ORDER — METOPROLOL TARTRATE 5 MG/5ML IV SOLN: 2.5000 mg | INTRAVENOUS | Status: DC | PRN

## 2024-04-26 MED ORDER — SODIUM CHLORIDE 0.9 % IV SOLN: INTRAVENOUS | Status: DC | PRN

## 2024-04-26 MED ORDER — EPHEDRINE 5 MG/ML INJ
INTRAVENOUS | Status: AC
  Filled 2024-04-26: qty 5

## 2024-04-26 MED ORDER — HEPARIN SODIUM (PORCINE) 1000 UNIT/ML IJ SOLN
INTRAMUSCULAR | Status: AC
  Filled 2024-04-26: qty 1

## 2024-04-26 MED ORDER — PROPOFOL 10 MG/ML IV BOLUS
INTRAVENOUS | Status: DC | PRN
  Administered 2024-04-26: 80 mg via INTRAVENOUS

## 2024-04-26 MED ORDER — SODIUM CHLORIDE 0.9% FLUSH: 3.0000 mL | INTRAVENOUS | Status: DC | PRN

## 2024-04-26 MED ORDER — PHENYLEPHRINE 80 MCG/ML (10ML) SYRINGE FOR IV PUSH (FOR BLOOD PRESSURE SUPPORT)
PREFILLED_SYRINGE | INTRAVENOUS | Status: AC
Start: 2024-04-26 — End: 2024-04-26
  Filled 2024-04-26: qty 10

## 2024-04-26 MED ORDER — MILRINONE LACTATE IN DEXTROSE 20-5 MG/100ML-% IV SOLN: 0.2500 ug/kg/min | INTRAVENOUS | Status: DC

## 2024-04-26 MED ORDER — PROTAMINE SULFATE 10 MG/ML IV SOLN
INTRAVENOUS | Status: AC
  Filled 2024-04-26: qty 5

## 2024-04-26 MED ORDER — CHLORHEXIDINE GLUCONATE 4 % EX SOLN: 30.0000 mL | CUTANEOUS | Status: DC

## 2024-04-26 MED ORDER — MILRINONE LACTATE IN DEXTROSE 20-5 MG/100ML-% IV SOLN
0.1250 ug/kg/min | INTRAVENOUS | Status: DC
  Administered 2024-04-26: 0.125 ug/kg/min via INTRAVENOUS

## 2024-04-26 MED ORDER — VASOPRESSIN 20 UNIT/ML IV SOLN
INTRAVENOUS | Status: DC | PRN
  Administered 2024-04-26 (×6): .5 [IU] via INTRAVENOUS

## 2024-04-26 MED ORDER — FENTANYL CITRATE (PF) 250 MCG/5ML IJ SOLN
INTRAMUSCULAR | Status: AC
  Filled 2024-04-26: qty 5

## 2024-04-26 MED ORDER — INSULIN REGULAR(HUMAN) IN NACL 100-0.9 UT/100ML-% IV SOLN: INTRAVENOUS | Status: DC

## 2024-04-26 MED ORDER — ~~LOC~~ CARDIAC SURGERY, PATIENT & FAMILY EDUCATION
Freq: Once | Status: DC
  Filled 2024-04-26: qty 1

## 2024-04-26 MED ORDER — EPINEPHRINE HCL 5 MG/250ML IV SOLN IN NS: 0.0000 ug/min | INTRAVENOUS | Status: DC

## 2024-04-26 MED ORDER — SODIUM CHLORIDE 0.9 % IV SOLN: INTRAVENOUS | Status: AC

## 2024-04-26 MED ORDER — PANTOPRAZOLE SODIUM 40 MG IV SOLR
40.0000 mg | Freq: Every day | INTRAVENOUS | Status: AC
  Administered 2024-04-26 – 2024-04-27 (×2): 40 mg via INTRAVENOUS
  Filled 2024-04-26 (×2): qty 10

## 2024-04-26 MED ORDER — METOPROLOL TARTRATE 12.5 MG HALF TABLET
12.5000 mg | ORAL_TABLET | Freq: Once | ORAL | Status: DC
  Filled 2024-04-26: qty 1

## 2024-04-26 MED ORDER — LACTATED RINGERS IV SOLN
INTRAVENOUS | Status: DC | PRN
Start: 2024-04-26 — End: 2024-04-26

## 2024-04-26 MED ORDER — SODIUM CHLORIDE 0.45 % IV SOLN: INTRAVENOUS | Status: AC | PRN

## 2024-04-26 MED ORDER — BISACODYL 5 MG PO TBEC
10.0000 mg | DELAYED_RELEASE_TABLET | Freq: Every day | ORAL | Status: DC
  Administered 2024-04-27 – 2024-04-30 (×4): 10 mg via ORAL
  Filled 2024-04-26 (×4): qty 2

## 2024-04-26 MED ORDER — METOPROLOL TARTRATE 12.5 MG HALF TABLET: 12.5000 mg | ORAL_TABLET | Freq: Two times a day (BID) | ORAL | Status: DC

## 2024-04-26 MED ORDER — INTEGRA PLUS PO CAPS: 1.0000 | ORAL_CAPSULE | Freq: Every day | ORAL | Status: DC

## 2024-04-26 MED ORDER — METOCLOPRAMIDE HCL 5 MG/ML IJ SOLN
10.0000 mg | Freq: Four times a day (QID) | INTRAMUSCULAR | Status: AC
  Administered 2024-04-26 – 2024-04-27 (×6): 10 mg via INTRAVENOUS
  Filled 2024-04-26 (×6): qty 2

## 2024-04-26 MED ORDER — SODIUM CHLORIDE 0.9% FLUSH
3.0000 mL | Freq: Two times a day (BID) | INTRAVENOUS | Status: DC
  Administered 2024-04-27 – 2024-04-30 (×7): 3 mL via INTRAVENOUS

## 2024-04-26 MED ORDER — LACTATED RINGERS IV SOLN: INTRAVENOUS | Status: DC | PRN

## 2024-04-26 MED ORDER — FENTANYL CITRATE (PF) 250 MCG/5ML IJ SOLN
INTRAMUSCULAR | Status: DC | PRN
  Administered 2024-04-26: 50 ug via INTRAVENOUS
  Administered 2024-04-26: 250 ug via INTRAVENOUS
  Administered 2024-04-26: 150 ug via INTRAVENOUS
  Administered 2024-04-26: 50 ug via INTRAVENOUS

## 2024-04-26 MED ORDER — MAGNESIUM SULFATE 4 GM/100ML IV SOLN
4.0000 g | Freq: Once | INTRAVENOUS | Status: AC
  Administered 2024-04-26: 4 g via INTRAVENOUS
  Filled 2024-04-26: qty 100

## 2024-04-26 MED ORDER — CEFAZOLIN SODIUM-DEXTROSE 2-4 GM/100ML-% IV SOLN
2.0000 g | Freq: Three times a day (TID) | INTRAVENOUS | Status: AC
  Administered 2024-04-26 – 2024-04-28 (×6): 2 g via INTRAVENOUS
  Filled 2024-04-26 (×6): qty 100

## 2024-04-26 MED ORDER — LATANOPROST 0.005 % OP SOLN
1.0000 [drp] | Freq: Every day | OPHTHALMIC | Status: DC
  Administered 2024-04-26 – 2024-05-01 (×6): 1 [drp] via OPHTHALMIC
  Filled 2024-04-26: qty 2.5

## 2024-04-26 MED ORDER — VANCOMYCIN HCL IN DEXTROSE 1-5 GM/200ML-% IV SOLN
1500.0000 mg | Freq: Once | INTRAVENOUS | Status: DC
  Filled 2024-04-26: qty 400

## 2024-04-26 MED ORDER — ROCURONIUM BROMIDE 10 MG/ML (PF) SYRINGE
PREFILLED_SYRINGE | INTRAVENOUS | Status: AC
  Filled 2024-04-26: qty 20

## 2024-04-26 MED ORDER — TRANEXAMIC ACID 1000 MG/10ML IV SOLN
1.5000 mg/kg/h | INTRAVENOUS | Status: DC
  Filled 2024-04-26: qty 25

## 2024-04-26 MED ORDER — SODIUM BICARBONATE 8.4 % IV SOLN
50.0000 meq | Freq: Once | INTRAVENOUS | Status: AC
  Administered 2024-04-26: 50 meq via INTRAVENOUS

## 2024-04-26 MED ORDER — TRAMADOL HCL 50 MG PO TABS
50.0000 mg | ORAL_TABLET | ORAL | Status: DC | PRN
  Administered 2024-04-28: 100 mg via ORAL
  Filled 2024-04-26: qty 2

## 2024-04-26 MED ORDER — VASOPRESSIN 20 UNIT/ML IV SOLN
INTRAVENOUS | Status: AC
  Filled 2024-04-26: qty 1

## 2024-04-26 MED ORDER — THROMBIN 20000 UNITS EX SOLR
OROMUCOSAL | Status: DC | PRN
  Administered 2024-04-26 (×3): 4 mL via TOPICAL

## 2024-04-26 MED ORDER — SODIUM CHLORIDE 0.9% IV SOLUTION: Freq: Once | INTRAVENOUS | Status: DC

## 2024-04-26 MED ORDER — ACETAMINOPHEN 160 MG/5ML PO SOLN
650.0000 mg | Freq: Once | ORAL | Status: AC
  Administered 2024-04-26: 650 mg
  Filled 2024-04-26: qty 20.3

## 2024-04-26 MED ORDER — BISACODYL 10 MG RE SUPP: 10.0000 mg | Freq: Every day | RECTAL | Status: DC

## 2024-04-26 MED ORDER — ACETAMINOPHEN 160 MG/5ML PO SOLN: 1000.0000 mg | Freq: Four times a day (QID) | ORAL | Status: AC

## 2024-04-26 MED ORDER — EZETIMIBE 10 MG PO TABS
10.0000 mg | ORAL_TABLET | Freq: Every day | ORAL | Status: DC
  Administered 2024-04-28 – 2024-04-30 (×3): 10 mg via ORAL
  Filled 2024-04-26 (×3): qty 1

## 2024-04-26 MED ORDER — PROTAMINE SULFATE 10 MG/ML IV SOLN
INTRAVENOUS | Status: DC | PRN
  Administered 2024-04-26: 350 mg via INTRAVENOUS

## 2024-04-26 MED ORDER — LIDOCAINE 2% (20 MG/ML) 5 ML SYRINGE
INTRAMUSCULAR | Status: AC
  Filled 2024-04-26: qty 5

## 2024-04-26 MED ORDER — MORPHINE SULFATE (PF) 2 MG/ML IV SOLN
1.0000 mg | INTRAVENOUS | Status: DC | PRN
  Administered 2024-04-27 (×3): 2 mg via INTRAVENOUS
  Administered 2024-04-27: 1 mg via INTRAVENOUS
  Filled 2024-04-26 (×4): qty 1

## 2024-04-26 MED ORDER — THROMBIN (RECOMBINANT) 20000 UNITS EX SOLR
CUTANEOUS | Status: AC
Start: 2024-04-26 — End: 2024-04-26
  Filled 2024-04-26: qty 20000

## 2024-04-26 MED ORDER — OXYCODONE HCL 5 MG PO TABS
5.0000 mg | ORAL_TABLET | ORAL | Status: DC | PRN
  Administered 2024-04-27 – 2024-05-01 (×4): 10 mg via ORAL
  Filled 2024-04-26 (×4): qty 2

## 2024-04-26 MED ORDER — MONTELUKAST SODIUM 10 MG PO TABS
10.0000 mg | ORAL_TABLET | Freq: Every day | ORAL | Status: DC
  Administered 2024-04-27 – 2024-05-01 (×5): 10 mg via ORAL
  Filled 2024-04-26 (×5): qty 1

## 2024-04-26 MED ORDER — DEXMEDETOMIDINE HCL IN NACL 400 MCG/100ML IV SOLN: 0.0000 ug/kg/h | INTRAVENOUS | Status: DC

## 2024-04-26 MED ORDER — PROTAMINE SULFATE 10 MG/ML IV SOLN
INTRAVENOUS | Status: AC
  Filled 2024-04-26: qty 25

## 2024-04-26 MED ORDER — PANTOPRAZOLE SODIUM 40 MG PO TBEC
40.0000 mg | DELAYED_RELEASE_TABLET | Freq: Every day | ORAL | Status: DC
  Administered 2024-04-28 – 2024-04-30 (×3): 40 mg via ORAL
  Filled 2024-04-26 (×3): qty 1

## 2024-04-26 MED ORDER — DEXTROSE 50 % IV SOLN: 0.0000 mL | INTRAVENOUS | Status: DC | PRN

## 2024-04-26 MED ORDER — EPHEDRINE SULFATE-NACL 50-0.9 MG/10ML-% IV SOSY
PREFILLED_SYRINGE | INTRAVENOUS | Status: DC | PRN
Start: 2024-04-26 — End: 2024-04-26
  Administered 2024-04-26 (×2): 2.5 mg via INTRAVENOUS
  Administered 2024-04-26: 5 mg via INTRAVENOUS

## 2024-04-26 MED ORDER — PLASMA-LYTE A IV SOLN
INTRAVENOUS | Status: DC | PRN
  Administered 2024-04-26: 500 mL

## 2024-04-26 MED ORDER — PROPOFOL 10 MG/ML IV BOLUS
INTRAVENOUS | Status: AC
Start: 2024-04-26 — End: 2024-04-26
  Filled 2024-04-26: qty 20

## 2024-04-26 MED ORDER — ONDANSETRON HCL 4 MG/2ML IJ SOLN: 4.0000 mg | Freq: Four times a day (QID) | INTRAMUSCULAR | Status: DC | PRN

## 2024-04-26 MED ORDER — NITROGLYCERIN IN D5W 200-5 MCG/ML-% IV SOLN: 0.0000 ug/min | INTRAVENOUS | Status: DC

## 2024-04-26 MED ORDER — NOREPINEPHRINE 4 MG/250ML-% IV SOLN
0.0000 ug/min | INTRAVENOUS | Status: DC
  Administered 2024-04-26: 2 ug/min via INTRAVENOUS
  Administered 2024-04-26: 10 ug/min via INTRAVENOUS
  Filled 2024-04-26: qty 250

## 2024-04-26 MED ORDER — HEPARIN SODIUM (PORCINE) 1000 UNIT/ML IJ SOLN
INTRAMUSCULAR | Status: DC | PRN
  Administered 2024-04-26: 36000 [IU] via INTRAVENOUS

## 2024-04-26 MED ORDER — ASPIRIN 81 MG PO CHEW
324.0000 mg | CHEWABLE_TABLET | Freq: Once | ORAL | Status: AC
  Administered 2024-04-26: 324 mg via ORAL
  Filled 2024-04-26: qty 4

## 2024-04-26 MED ORDER — ALBUMIN HUMAN 5 % IV SOLN
250.0000 mL | INTRAVENOUS | Status: DC | PRN
  Administered 2024-04-26: 12.5 g via INTRAVENOUS

## 2024-04-26 MED ORDER — OXIDIZED CELLULOSE EX PADS
MEDICATED_PAD | CUTANEOUS | Status: DC | PRN
  Administered 2024-04-26: 1 via TOPICAL

## 2024-04-26 MED ORDER — PRAVASTATIN SODIUM 10 MG PO TABS
10.0000 mg | ORAL_TABLET | Freq: Every day | ORAL | Status: DC
  Administered 2024-04-26 – 2024-05-02 (×7): 10 mg via ORAL
  Filled 2024-04-26 (×7): qty 1

## 2024-04-26 MED ORDER — ASPIRIN 325 MG PO TBEC
325.0000 mg | DELAYED_RELEASE_TABLET | Freq: Every day | ORAL | Status: DC
  Administered 2024-04-28 – 2024-04-30 (×3): 325 mg via ORAL
  Filled 2024-04-26 (×3): qty 1

## 2024-04-26 MED ORDER — VANCOMYCIN HCL 1500 MG/300ML IV SOLN
1500.0000 mg | INTRAVENOUS | Status: AC
  Administered 2024-04-26: 1500 mg via INTRAVENOUS
  Filled 2024-04-26: qty 300

## 2024-04-26 MED ORDER — METOPROLOL TARTRATE 25 MG/10 ML ORAL SUSPENSION: 12.5000 mg | Freq: Two times a day (BID) | ORAL | Status: DC

## 2024-04-26 MED ORDER — THROMBIN 20000 UNITS EX SOLR
CUTANEOUS | Status: DC | PRN
  Administered 2024-04-26: 20000 [IU] via TOPICAL

## 2024-04-26 MED ORDER — ASPIRIN 81 MG PO CHEW
324.0000 mg | CHEWABLE_TABLET | Freq: Every day | ORAL | Status: DC
  Administered 2024-04-27: 324 mg
  Filled 2024-04-26: qty 4

## 2024-04-26 MED ORDER — MIDAZOLAM HCL (PF) 2 MG/2ML IJ SOLN: 2.0000 mg | INTRAMUSCULAR | Status: DC | PRN

## 2024-04-26 MED ORDER — CHLORHEXIDINE GLUCONATE 0.12 % MT SOLN
15.0000 mL | Freq: Once | OROMUCOSAL | Status: DC
  Filled 2024-04-26: qty 15

## 2024-04-26 MED ORDER — SODIUM CHLORIDE 0.9 % IV SOLN: 250.0000 mL | INTRAVENOUS | Status: AC

## 2024-04-26 MED ORDER — ROCURONIUM BROMIDE 10 MG/ML (PF) SYRINGE
PREFILLED_SYRINGE | INTRAVENOUS | Status: DC | PRN
  Administered 2024-04-26: 50 mg via INTRAVENOUS
  Administered 2024-04-26: 30 mg via INTRAVENOUS
  Administered 2024-04-26: 80 mg via INTRAVENOUS
  Administered 2024-04-26: 20 mg via INTRAVENOUS

## 2024-04-26 MED ORDER — POTASSIUM CHLORIDE 10 MEQ/50ML IV SOLN: 10.0000 meq | INTRAVENOUS | Status: AC

## 2024-04-26 MED ORDER — CHLORHEXIDINE GLUCONATE 0.12 % MT SOLN
15.0000 mL | OROMUCOSAL | Status: AC
  Administered 2024-04-26: 15 mL via OROMUCOSAL
  Filled 2024-04-26: qty 15

## 2024-04-26 MED ORDER — LIDOCAINE 2% (20 MG/ML) 5 ML SYRINGE
INTRAMUSCULAR | Status: DC | PRN
  Administered 2024-04-26: 40 mg via INTRAVENOUS

## 2024-04-26 SURGICAL SUPPLY — 88 items
ADAPTER CARDIO PERF ANTE/RETRO (ADAPTER) ×2 IMPLANT
BAG DECANTER FOR FLEXI CONT (MISCELLANEOUS) ×2 IMPLANT
BLADE CLIPPER SURG (BLADE) ×2 IMPLANT
BLADE STERNUM SYSTEM 6 (BLADE) ×2 IMPLANT
BLADE SURG 15 STRL LF DISP TIS (BLADE) ×2 IMPLANT
BLOOD HAEMOCONCENTR 700 MIDI (MISCELLANEOUS) IMPLANT
BNDG ELASTIC 4X5.8 VLCR STR LF (GAUZE/BANDAGES/DRESSINGS) ×2 IMPLANT
BNDG ELASTIC 6INX 5YD STR LF (GAUZE/BANDAGES/DRESSINGS) ×2 IMPLANT
BNDG GAUZE DERMACEA FLUFF 4 (GAUZE/BANDAGES/DRESSINGS) ×2 IMPLANT
CANISTER SUCTION 3000ML PPV (SUCTIONS) ×2 IMPLANT
CANNULA AORTIC ROOT 9FR (CANNULA) ×2 IMPLANT
CANNULA ARTERIAL NVNT 3/8 20FR (MISCELLANEOUS) IMPLANT
CANNULA GUNDRY RCSP 15FR (MISCELLANEOUS) ×2 IMPLANT
CANNULA MC2 2 STG 36/46 NON-V (CANNULA) IMPLANT
CATH HEART VENT LEFT (CATHETERS) ×2 IMPLANT
CATH ROBINSON RED A/P 18FR (CATHETERS) ×6 IMPLANT
CATH THORACIC 28FR (CATHETERS) ×2 IMPLANT
CATH THORACIC 36FR (CATHETERS) ×2 IMPLANT
CATH THORACIC 36FR RT ANG (CATHETERS) ×2 IMPLANT
CLIP TI WIDE RED SMALL 24 (CLIP) IMPLANT
CNTNR URN SCR LID CUP LEK RST (MISCELLANEOUS) ×2 IMPLANT
CONTAINER PROTECT SURGISLUSH (MISCELLANEOUS) ×4 IMPLANT
COVER SURGICAL LIGHT HANDLE (MISCELLANEOUS) ×2 IMPLANT
DERMABOND ADVANCED .7 DNX12 (GAUZE/BANDAGES/DRESSINGS) IMPLANT
DRAPE SRG 135X102X78XABS (DRAPES) ×2 IMPLANT
DRAPE WARM FLUID 44X44 (DRAPES) ×2 IMPLANT
DRSG COVADERM 4X14 (GAUZE/BANDAGES/DRESSINGS) ×2 IMPLANT
ELECT CAUTERY BLADE 6.4 (BLADE) ×2 IMPLANT
ELECTRODE BLDE 4.0 EZ CLN MEGD (MISCELLANEOUS) ×2 IMPLANT
ELECTRODE REM PT RTRN 9FT ADLT (ELECTROSURGICAL) ×4 IMPLANT
FELT TEFLON 1X6 (MISCELLANEOUS) ×4 IMPLANT
GAUZE SPONGE 4X4 12PLY STRL (GAUZE/BANDAGES/DRESSINGS) ×4 IMPLANT
GLOVE BIO SURGEON STRL SZ 6 (GLOVE) IMPLANT
GLOVE BIO SURGEON STRL SZ 6.5 (GLOVE) IMPLANT
GLOVE BIO SURGEON STRL SZ7 (GLOVE) IMPLANT
GLOVE BIO SURGEON STRL SZ7.5 (GLOVE) IMPLANT
GLOVE BIOGEL PI IND STRL 6.5 (GLOVE) IMPLANT
GLOVE BIOGEL PI IND STRL 7.0 (GLOVE) IMPLANT
GLOVE BIOGEL PI MICRO STRL 7 (GLOVE) ×4 IMPLANT
GLOVE PI ORTHO PRO STRL 7.5 (GLOVE) IMPLANT
GLOVE SURG MICRO LTX SZ7 (GLOVE) ×4 IMPLANT
GOWN STRL REUS W/ TWL LRG LVL3 (GOWN DISPOSABLE) ×8 IMPLANT
GOWN STRL REUS W/ TWL XL LVL3 (GOWN DISPOSABLE) ×2 IMPLANT
HEMOSTAT POWDER SURGIFOAM 1G (HEMOSTASIS) ×6 IMPLANT
HEMOSTAT SURGICEL 2X14 (HEMOSTASIS) ×2 IMPLANT
KIT BASIN OR (CUSTOM PROCEDURE TRAY) ×2 IMPLANT
KIT SUCTION CATH 14FR (SUCTIONS) ×2 IMPLANT
KIT TURNOVER KIT B (KITS) ×2 IMPLANT
KIT VASOVIEW HEMOPRO 2 VH 4000 (KITS) ×2 IMPLANT
KNIFE MICRO-UNI 3.5 30 DEG (BLADE) ×2 IMPLANT
LINE VENT (MISCELLANEOUS) IMPLANT
PACK E OPEN HEART (SUTURE) ×2 IMPLANT
PACK OPEN HEART (CUSTOM PROCEDURE TRAY) ×2 IMPLANT
PAD ARMBOARD POSITIONER FOAM (MISCELLANEOUS) ×4 IMPLANT
PAD ELECT DEFIB RADIOL ZOLL (MISCELLANEOUS) ×2 IMPLANT
PENCIL BUTTON HOLSTER BLD 10FT (ELECTRODE) ×2 IMPLANT
POSITIONER HEAD DONUT 9IN (MISCELLANEOUS) ×2 IMPLANT
PUNCH AORTIC ROTATE 4.5MM 8IN (MISCELLANEOUS) ×2 IMPLANT
SET MPS 3-ND DEL (MISCELLANEOUS) IMPLANT
SOLN 0.9% NACL POUR BTL 1000ML (IV SOLUTION) ×12 IMPLANT
SOLN STERILE WATER BTL 1000 ML (IV SOLUTION) ×4 IMPLANT
SUPPORT HEART JANKE-BARRON (MISCELLANEOUS) ×2 IMPLANT
SUT BONE WAX W31G (SUTURE) ×2 IMPLANT
SUT EB EXC GRN/WHT 2-0 V-5 (SUTURE) ×4 IMPLANT
SUT MNCRL AB 4-0 PS2 18 (SUTURE) IMPLANT
SUT PROLENE 3 0 SH DA (SUTURE) IMPLANT
SUT PROLENE 3 0 SH1 36 (SUTURE) ×2 IMPLANT
SUT PROLENE 4 0 SH DA (SUTURE) IMPLANT
SUT PROLENE 4-0 RB1 .5 CRCL 36 (SUTURE) ×6 IMPLANT
SUT PROLENE 5 0 C 1 36 (SUTURE) IMPLANT
SUT PROLENE 6 0 C 1 30 (SUTURE) IMPLANT
SUT PROLENE 7 0 BV 1 (SUTURE) IMPLANT
SUT PROLENE 7 0 BV1 MDA (SUTURE) ×2 IMPLANT
SUT PROLENE 8 0 BV175 6 (SUTURE) IMPLANT
SUT SILK 1 MH (SUTURE) IMPLANT
SUT SILK 2 0 SH CR/8 (SUTURE) IMPLANT
SUT STEEL 6MS V (SUTURE) IMPLANT
SUT STEEL STERNAL CCS#1 18IN (SUTURE) IMPLANT
SUT STEEL SZ 6 DBL 3X14 BALL (SUTURE) IMPLANT
SUT VIC AB 1 CTX36XBRD ANBCTR (SUTURE) ×4 IMPLANT
SYSTEM SAHARA CHEST DRAIN ATS (WOUND CARE) ×2 IMPLANT
TOWEL GREEN STERILE (TOWEL DISPOSABLE) ×2 IMPLANT
TOWEL GREEN STERILE FF (TOWEL DISPOSABLE) ×2 IMPLANT
TRAY FOLEY SLVR 16FR TEMP STAT (SET/KITS/TRAYS/PACK) ×2 IMPLANT
TUBE SUCT INTRACARD DLP 20F (MISCELLANEOUS) ×2 IMPLANT
TUBE SUCTION CARDIAC 10FR (CANNULA) ×2 IMPLANT
TUBING LAP HI FLOW INSUFFLATIO (TUBING) ×2 IMPLANT
UNDERPAD 30X36 HEAVY ABSORB (UNDERPADS AND DIAPERS) ×2 IMPLANT

## 2024-04-26 NOTE — Discharge Instructions (Signed)

## 2024-04-26 NOTE — Progress Notes (Signed)
  Echocardiogram Echocardiogram Transesophageal has been performed.  Todd Spears, RDCS 04/26/2024, 8:40 AM

## 2024-04-26 NOTE — Procedures (Signed)
 Extubation Procedure Note  Patient Details:   Name: KATLIN CISZEWSKI DOB: Oct 15, 1945 MRN: [id number]   Airway Documentation:    Vent end date: 04/26/24 Vent end time: 1715   Evaluation  O2 sats: stable throughout Complications: No apparent complications Patient did tolerate procedure well. Bilateral Breath Sounds: Clear, Diminished   Yes  RT extubated patient per rapid wean protocol with RN at bedside. Positive cuff leak noted. NIF -25 VC 688. Patient tolerated well and no stridor or distress noted at this time. RT will continue to monitor as needed.   Paulla ONEIDA Gaskins 04/26/2024, 5:32 PM

## 2024-04-26 NOTE — Plan of Care (Signed)

## 2024-04-26 NOTE — Anesthesia Preprocedure Evaluation (Addendum)
 Anesthesia Evaluation  Patient identified by MRN, date of birth, ID band Patient awake    Reviewed: Allergy & Precautions, NPO status , Patient's Chart, lab work & pertinent test results  Airway Mallampati: II  TM Distance: >3 FB Neck ROM: Full    Dental  (+) Teeth Intact, Dental Advisory Given   Pulmonary asthma , former smoker   breath sounds clear to auscultation       Cardiovascular hypertension, Pt. on medications and Pt. on home beta blockers + CAD and + Cardiac Stents   Rhythm:Regular Rate:Normal  Echo:  1. Left ventricular ejection fraction, by estimation, is 50 to 55%. The  left ventricle has low normal function. The left ventricle has no regional  wall motion abnormalities. Left ventricular diastolic parameters are  consistent with Grade II diastolic  dysfunction (pseudonormalization). Elevated left atrial pressure.   2. Right ventricular systolic function is mildly reduced. The right  ventricular size is mildly enlarged. There is moderately elevated  pulmonary artery systolic pressure.   3. The mitral valve is normal in structure. Mild mitral valve  regurgitation. No evidence of mitral stenosis.   4. Suspect Parodoxical Normal EF Low flow Low gradient Severe Aortic  stenosis. The aortic valve is calcified. There is mild calcification of  the aortic valve. There is mild thickening of the aortic valve. Aortic  valve regurgitation is not visualized.  Suspect Parodoxical Normal EF Low flow Low gradient Severe Aortic  stenosis. Aortic valve area, by VTI measures 0.70 cm. Aortic valve mean  gradient measures 26.0 mmHg. Aortic valve Vmax measures 3.44 m/s.   5. The inferior vena cava is normal in size with <50% respiratory  variability, suggesting right atrial pressure of 8 mmHg.     Neuro/Psych negative neurological ROS  negative psych ROS   GI/Hepatic Neg liver ROS,GERD  Medicated,,  Endo/Other  negative  endocrine ROS    Renal/GU Renal disease     Musculoskeletal  (+) Arthritis ,    Abdominal   Peds  Hematology  (+) Blood dyscrasia, anemia   Anesthesia Other Findings   Reproductive/Obstetrics                              Anesthesia Physical Anesthesia Plan  ASA: 4  Anesthesia Plan: General   Post-op Pain Management: Ofirmev  IV (intra-op)*   Induction: Intravenous  PONV Risk Score and Plan: 3 and Ondansetron , Dexamethasone  and Midazolam   Airway Management Planned: Oral ETT  Additional Equipment: Arterial line, PA Cath, CVP, TEE and Ultrasound Guidance Line Placement  Intra-op Plan:   Post-operative Plan: Post-operative intubation/ventilation  Informed Consent: I have reviewed the patients History and Physical, chart, labs and discussed the procedure including the risks, benefits and alternatives for the proposed anesthesia with the patient or authorized representative who has indicated his/her understanding and acceptance.     Dental advisory given  Plan Discussed with: CRNA  Anesthesia Plan Comments:          Anesthesia Quick Evaluation

## 2024-04-26 NOTE — Anesthesia Procedure Notes (Signed)
 Central Venous Catheter Insertion Performed by: Tilford Franky BIRCH, MD, anesthesiologist Start/End11/24/2025 6:55 AM, 04/26/2024 7:00 AM Patient location: Pre-op. Preanesthetic checklist: patient identified, IV checked, site marked, risks and benefits discussed, surgical consent, monitors and equipment checked, pre-op evaluation, timeout performed and anesthesia consent Hand hygiene performed  and maximum sterile barriers used  PA cath was placed.Swan type:thermodilution Attempts: 1 Patient tolerated the procedure well with no immediate complications.

## 2024-04-26 NOTE — Op Note (Signed)
 CARDIOVASCULAR SURGERY OPERATIVE NOTE  04/26/2024  Surgeon:  Dorise LOIS Fellers, MD  First Assistant: Kyla Donald,  [name]:   An experienced assistant was required given the complexity of this surgery and the standard of surgical care. The assistant was needed for endoscopic vein harvest, exposure, dissection, suctioning, retraction of delicate tissues and sutures, instrument exchange and for overall help during this procedure.   Preoperative Diagnosis:  Severe multi-vessel coronary artery disease   Postoperative Diagnosis:  Same   Procedure:  Median Sternotomy Extracorporeal circulation 3.   Coronary artery bypass grafting x 4  Left internal mammary artery graft to the LAD SVG to diagonal SVG to OM SVG to distal RCA 4.   Endoscopic vein harvest from the right leg   Anesthesia:  General Endotracheal   Clinical History/Surgical Indication:  The patient is a 78 year old gentleman with history of hypertension, hyperlipidemia, stage III chronic kidney disease, obesity, and coronary artery disease status post stenting of the mid LAD and mid RCA in 04/2012.  He reportedly had no significant symptoms at that time but an abnormal stress test.  He has been followed by cardiology and presented in September 2025 with shortness of breath on exertion.  A PET/CT showed anterior ischemia with infarction in the inferior wall.  Ejection fraction was 37%.  He underwent cardiac catheterization on 03/19/2024 showing left main and three-vessel coronary disease.  The aortic valve gradient was 24 mmHg with an elevated end-diastolic pressure of 24 mmHg.  A 2D echocardiogram on 04/13/2024 showed a calcified and thickened aortic valve with restricted leaflet mobility.  The mean gradient was 26 mmHg with a peak of 47 mmHg and a valve area by VTI of 0.7 cm suggesting low-flow/low gradient severe aortic stenosis.   Stroke-volume index was low at 26 and dimensionless index was 0.2.  Left ventricular ejection fraction was mildly reduced at 50 to 55%.  There is mild mitral regurgitation.    I had planned on doing coronary artery bypass graft surgery and aortic valve replacement last Friday but his CTA of the chest showed significant aortic root and ascending aortic calcification extending out into the aortic arch.  I was concerned about being able to cross-clamp his aorta and open up the aorta to be able to replace the aortic valve.  I decided to postpone his surgical procedure and discuss him at TAVR conference.  The consensus opinion was that he has severe left main and multivessel coronary disease with severe calcification of the left main extending into the proximal left circumflex and LAD which would make percutaneous intervention risky.  It was also felt that this type of intervention would not be possible with a transcatheter valve in place.  The consensus was that it would be best to consider coronary bypass surgery, possibly off-pump or on pump and beating and then consider TAVR if open surgical AVR was not felt to be possible.   I think the best option for this patient is going to be to proceed with coronary bypass graft surgery either on pump with the heart beating or arrested depending on the ability to cross-clamp the aorta. If the proximal ascending aorta appears suitable to open then I would plan to proceed with aortic valve replacement at that time. If I do not feel it is safe to open the aorta then we will plan to perform TAVR later. He could have some hemodynamic difficulties postop with persistent severe aortic stenosis present after coronary bypass graft surgery. We will get a gated cardiac  CTA of the chest to be sure that he is a potential candidate for TAVR later if necessary. I discussed the operative procedure with the patient and his wife including alternatives, benefits and risks; including but not  limited to bleeding, blood transfusion, infection, stroke, myocardial infarction, graft failure, heart block requiring a permanent pacemaker, organ dysfunction, and death. [name] understands and agrees to proceed.   Preparation:  The patient was seen in the preoperative holding area and the correct patient, correct operation were confirmed with the patient after reviewing the medical record and catheterization. The consent was signed by me. Preoperative antibiotics were given. A pulmonary arterial line and radial arterial line were placed by the anesthesia team. The patient was taken back to the operating room and positioned supine on the operating room table. After being placed under general endotracheal anesthesia by the anesthesia team a foley catheter was placed. The neck, chest, abdomen, and both legs were prepped with betadine soap and solution and draped in the usual sterile manner. A surgical time-out was taken and the correct patient and operative procedure were confirmed with the nursing and anesthesia staff.  TEE: performed by Dr. Tilford. This showed a calcified trileaflet aortic valve with restricted leaflet mobility although the leaflets still moved. Mean gradient was 30 mm Hg. Trivial central AI. No MR. EF 50-55% with inferior hypokinesis.  Cardiopulmonary Bypass:  A median sternotomy was performed. The pericardium was opened in the midline. Right ventricular function appeared normal. The ascending aorta was of normal size and had heavy calcified plaque in the aortic root and proximal ascending aorta in the area that I would have to open the aorta for valve replacement. His ascending aorta was relatively short which did not leave enough room to do an aortotomy higher and still be able to put bypass grafts on it. Since his valve appeared suitable for TAVR on cardiac CTA I thought it would be best to do bypass surgery and plan to do TAVR later.  There were no contraindications to aortic  cannulation or cross-clamping. The patient was fully systemically heparinized and the ACT was maintained > 400 sec. The proximal aortic arch was cannulated with a 20 F aortic cannula for arterial inflow. Venous cannulation was performed via the right atrial appendage using a two-staged venous cannula. An antegrade cardioplegia/vent cannula was inserted into the mid-ascending aorta. Aortic occlusion was performed with a single cross-clamp. Systemic cooling to 32 degrees Centigrade and topical cooling of the heart with iced saline were used. Cold antegrade KBC cardioplegia was used to induce diastolic arrest. A temperature probe was inserted into the interventricular septum and an insulating pad was placed in the pericardium.   Left internal mammary artery harvest:  The left side of the sternum was retracted using the Rultract retractor. The left internal mammary artery was harvested as a pedicle graft. All side branches were clipped. It was a large-sized vessel of good quality with excellent blood flow. It was ligated distally and divided. It was sprayed with topical papaverine  solution to prevent vasospasm.   Endoscopic vein harvest: Performed by Kyla Donald, [name]  The right greater saphenous vein was harvested endoscopically through a 2 cm incision medial to the right knee. It was harvested from the upper thigh to the lower leg. It was a medium-sized vein of good quality. The side branches were all ligated with 4-0 silk ties.    Coronary arteries:  The coronary arteries were examined.  LAD:  large vessel with segmental plaque  in the mid portion but distally was free of disease. The diagonal was heavily diseased proximally but the mid and distal portions were free of disease and it was a long vessel. LCX:  Large OM with no distal disease RCA:  Diffusely diseased but there was one spot distally just before the PDA that was soft enough to open and graft. The PDA itself was small.   Grafts:  Assisted by Kyla Donald, [name]  LIMA to the LAD: 2.5 mm. It was sewn end to side using 8-0 prolene continuous suture. SVG to diagonal:  1.6 mm. It was sewn end to side using 7-0 prolene continuous suture. SVG to OM:  2.0 mm. It was sewn end to side using 7-0 prolene continuous suture. SVG to distal RCA:  2.0 mm. It was sewn end to side using 7-0 prolene continuous suture.  The proximal vein graft anastomoses were performed to the mid-ascending aorta using continuous 6-0 prolene suture. Graft markers were placed around the proximal anastomoses.   Completion:  The patient was rewarmed to 37 degrees Centigrade. The clamp was removed from the LIMA pedicle and there was rapid warming of the septum and return of ventricular fibrillation. The crossclamp was removed with a time of 88 minutes. There was spontaneous return of sinus rhythm. The distal and proximal anastomoses were checked for hemostasis. The position of the grafts was satisfactory. Two temporary epicardial pacing wires were placed on the right atrium and two on the right ventricle. The patient was weaned from CPB without difficulty on no inotropes. CPB time was 105 minutes. We started milrinone  0.25 once off bypass since the PA pressures were moderately elevated as they were preop. Cardiac output was 5 LPM. TEE showed normal LV systolic function. Heparin  was fully reversed with protamine  and the aortic and venous cannulas removed. Hemostasis was achieved. Mediastinal and left pleural drainage tubes were placed. The sternum was closed with double #6 stainless steel wires. The fascia was closed with continuous # 1 vicryl suture. The subcutaneous tissue was closed with 2-0 vicryl continuous suture. The skin was closed with 3-0 vicryl subcuticular suture. All sponge, needle, and instrument counts were reported correct at the end of the case. Dry sterile dressings were placed over the incisions and around the chest tubes which were connected to  pleurevac suction. The patient was then transported to the surgical intensive care unit in stable condition.

## 2024-04-26 NOTE — Discharge Summary (Signed)
 [address] [place] [address]             [phone]        Physician Discharge Summary  Patient ID: Todd Spears MRN: [id number] DOB/AGE: 78-Apr-1947 78 y.o.  Admit date: 04/26/2024 Discharge date: 05/02/2024  Admission Diagnoses:  Patient Active Problem List   Diagnosis Date Noted   Coronary artery disease 04/26/2024   S/P CABG x 4 04/26/2024   Eustachian tube dysfunction 12/09/2023   Hyperglycemia 04/13/2020   Pain due to onychomycosis of toenails of both feet 11/18/2018   Porokeratosis 11/18/2018   Gout 06/18/2018   Osteoarthritis 06/18/2018   Carotid arterial disease 08/10/2015   Aortic stenosis, mild 08/10/2015   CKD (chronic kidney disease) stage 2, GFR 60-89 ml/min 07/03/2015   CAD s/p PCI 2013    Hyperlipidemia 11/27/2006   Essential hypertension 11/27/2006   Allergic rhinitis 11/27/2006   GERD 11/27/2006     Discharge Diagnoses:  Patient Active Problem List   Diagnosis Date Noted   Coronary artery disease 04/26/2024   S/P CABG x 4 04/26/2024   Eustachian tube dysfunction 12/09/2023   Hyperglycemia 04/13/2020   Pain due to onychomycosis of toenails of both feet 11/18/2018   Porokeratosis 11/18/2018   Gout 06/18/2018   Osteoarthritis 06/18/2018   Carotid arterial disease 08/10/2015   Aortic stenosis, mild 08/10/2015   CKD (chronic kidney disease) stage 2, GFR 60-89 ml/min 07/03/2015   CAD s/p PCI 2013    Hyperlipidemia 11/27/2006   Essential hypertension 11/27/2006   Allergic rhinitis 11/27/2006   GERD 11/27/2006     Discharged Condition: Stable  HPI: The patient is a 78 year old gentleman with history of hypertension, hyperlipidemia, stage III chronic kidney disease, obesity, and coronary artery disease status post stenting of the mid LAD and mid RCA in 04/2012.  He reportedly had no significant symptoms at that time but an abnormal stress test.  He has been followed by cardiology and presented in September  2025 with shortness of breath on exertion.  A PET/CT showed anterior ischemia with infarction in the inferior wall.  Ejection fraction was 37%.  He underwent cardiac catheterization on 03/19/2024 showing left main and three-vessel coronary disease.  The aortic valve gradient was 24 mmHg with an elevated end-diastolic pressure of 24 mmHg.  A 2D echocardiogram on 04/13/2024 showed a calcified and thickened aortic valve with restricted leaflet mobility.  The mean gradient was 26 mmHg with a peak of 47 mmHg and a valve area by VTI of 0.7 cm suggesting low-flow/low gradient severe aortic stenosis.  Stroke-volume index was low at 26 and dimensionless index was 0.2.  Left ventricular ejection fraction was mildly reduced at 50 to 55%.  There is mild mitral regurgitation. CTA done 11/14 showed no PE, small right pleural effusion, 3 mm anterior RUL. Dr. Lucas discussed the need for coronary artery bypass grafting surgery and possible aortic valve replacement (depending how much and the location of calcification of the aorta).  Hospital Course: Patient underwent a CABG x 4. Of note, he has significant aortic root and ascending aortic calcification . As a result, the decision was made to not replace the aortic valve. He will be evaluated far a TAVR in the future. He was extubated early the evening of surgery. Norva Purl, a line, and chest tubes were removed on POD 1. He has a history of CKD (stage IIIa). Creatinine post op went up to 1.62. His  baseline is around 1.3. He was transitioned off the Insulin  drip. His pre op HGA1C ws 5.5. Accu checks and SS PRN will be stopped after transfer to the floor. Foley and sleeve were removed on POD 2. She was started on low dose Lopressor  12.5 mg bid.  However, he developed bradycardia overnight and his Lopressor  was discontinued. He was volume overloaded and Demadex  was started on POD 2 with careful monitoring of creatinine.  He has urinary issues prior to admission, due to this he was  started on Flomax  with foley catheter removed on 11/28. Regarding rhythm, he had a few episodes of bradycardia (not on a BB). EPW set at VVI 50. If no further bradycardia, will remove EPW 11/29. He was felt stable for transfer from the ICU to 4E for further convalescence on 11/28.  The patient underwent PT/OT evaluation who recommended no PT/OT followup.  He had brief episode of PAF but converted quickly.  He remains clinically stable.  His ambulating with assistance of a walker.  His surgical incisions are healing without evidence of infection.  He is medically stable for discharge home today.  Consults: None  Significant Diagnostic Studies: cardiac graphics:   Echocardiogram:    Patient Name:   Todd Spears Date of Exam: 04/13/2024  Medical Rec #:  [id number]      Height:       67.0 in  Accession #:    [id number]     Weight:       246.8 lb  Date of Birth:  [birth year]       BSA:          2.211 m  Patient Age:    78 years       BP:           122/66 mmHg  Patient Gender: M              HR:           69 bpm.  Exam Location:  [address]   Procedure: 2D Echo, Cardiac Doppler and Color Doppler (Both Spectral and  Color            Flow Doppler were utilized during procedure).   Indications:    I35.0 AS    History:        Patient has prior history of Echocardiogram examinations,  most                 recent 04/08/2023. CAD, AS; Risk Factors:Hypertension,                  Dyslipidemia, Obesity and Former Smoker.    Sonographer:    Elsie Bohr RDCS  Referring Phys: [phone] PETER M JORDAN   IMPRESSIONS    1. Left ventricular ejection fraction, by estimation, is 50 to 55%. The  left ventricle has low normal function. The left ventricle has no regional  wall motion abnormalities. Left ventricular diastolic parameters are  consistent with Grade II diastolic  dysfunction (pseudonormalization). Elevated left atrial pressure.   2. Right ventricular systolic function is mildly reduced. The  right  ventricular size is mildly enlarged. There is moderately elevated  pulmonary artery systolic pressure.   3. The mitral valve is normal in structure. Mild mitral valve  regurgitation. No evidence of mitral stenosis.   4. Suspect Parodoxical Normal EF Low flow Low gradient Severe Aortic  stenosis. The aortic valve is calcified. There is mild calcification of  the aortic valve. There is mild  thickening of the aortic valve. Aortic  valve regurgitation is not visualized.  Suspect Parodoxical Normal EF Low flow Low gradient Severe Aortic  stenosis. Aortic valve area, by VTI measures 0.70 cm. Aortic valve mean  gradient measures 26.0 mmHg. Aortic valve Vmax measures 3.44 m/s.   5. The inferior vena cava is normal in size with <50% respiratory  variability, suggesting right atrial pressure of 8 mmHg.   Catheterization:    Ost RCA to Prox RCA lesion is 100% stenosed.  Prox RCA to Mid RCA lesion is 100% stenosed.  The RCA fills fills from left to right collaterals.   Ost LM to Ost LAD lesion is 50% stenosed with 80% stenosed side branch in Ost Cx to Prox Cx.  Ostial ramus lesion is 70% stenosed.   Mid LAD-1 lesion is 55% stenosed. Mid LAD-2 lesion is 50% stenosed.  Previously placed Mid LAD-3 stent of unknown type is widely patent.   In the absence of any other complications or medical issues, we expect the patient to be ready for discharge from a cath perspective on 03/19/2024.   Plan will be to discussed with Dr. Jordan and consider if he would be a potential candidate for CVTS consultation.  Need to reassess his symptoms.  He does have echocardiographic  evidence of moderate to severe AS.  (24 mmHg per cath)   Recommend Aspirin  81mg  daily for moderate CAD.     100% flush occlusion of the RCA with retrograde filling from left-to-right collaterals filling all the way back to the occlusion. Eccentric Left Main Disease, difficult to interpret stenosis, cannot exclude up to 50%.  The LAD gives  off a major diagonal branch just after the diagonal branch there is a focal 50% stenosis followed by another 50% stenosis. After large septal perforator there is a widely patent stent. The LAD reaches down around the apex and has mild diffuse luminal irregularities.. Bifurcation ostial LCx and RI 70 % RI, 80 % LCx stenosis.  Very torturous aorta iliac, abdominal aortic and aortic arch/innominate artery vessels making it very difficult to manipulate catheters either from femoral or radial access.  Would not recommend right radial access in the future.   Treatments: surgery:   Median Sternotomy Extracorporeal circulation 3.   Coronary artery bypass grafting x 4   Left internal mammary artery graft to the LAD SVG to diagonal SVG to OM SVG to distal RCA 4.   Endoscopic vein harvest from the right leg by Dr. Lucas on 04/26/2024.  Discharge Exam: Blood pressure 138/76, pulse 99, temperature (!) 96.9 F (36.1 C), temperature source Axillary, resp. rate 17, height (P) 5' 7 (1.702 m), weight 106 kg, SpO2 100%.  General appearance: alert, cooperative, and no distress Heart: regular rate and rhythm Lungs: clear to auscultation bilaterally Abdomen: soft, non-tender; bowel sounds normal; no masses,  no organomegaly Extremities: edema + pitting, less tight today Wound: clean and dry   Discharge Medications:  The patient has been discharged on:   1.Beta Blocker:  Yes [  ]                              No   [ X  ]                              If No, reason: Bardycardia  2.Ace Inhibitor/ARB: Yes [   ]  No  [  X  ]                                     If No, reason:elevated creatinine  3.Statin:   Yes [ x  ]                  No  [   ]                  If No, reason:  4.Ecasa:  Yes  [  x ]                  No   [   ]                  If No, reason:  Patient had ACS upon admission:  Plavix/P2Y12 inhibitor: Yes [   ]                                       No  [ x  ]     Discharge Instructions     Amb Referral to Cardiac Rehabilitation   Complete by: As directed    [facility]   Diagnosis: CABG   CABG X ___: 4   After initial evaluation and assessments completed: Virtual Based Care may be provided alone or in conjunction with Phase 2 Cardiac Rehab based on patient barriers.: Yes   Intensive Cardiac Rehabilitation (ICR) [facility] location only OR Traditional Cardiac Rehabilitation (TCR) *If criteria for ICR are not met will enroll in TCR ([facility] only): Yes      Allergies as of 05/02/2024       Reactions   Prednisone Shortness Of Breath, Swelling   Can tolerate as an injection but not orally   Rosuvastatin  Other (See Comments)   myalgia   Crestor  [rosuvastatin  Calcium ] Other (See Comments)   myalgia   Gabapentin  Other (See Comments)   Lyrica  [pregabalin ]    swelling        Medication List     STOP taking these medications    furosemide  20 MG tablet Commonly known as: LASIX    hydrALAZINE  50 MG tablet Commonly known as: APRESOLINE    losartan  25 MG tablet Commonly known as: COZAAR    metoprolol  succinate 50 MG 24 hr tablet Commonly known as: TOPROL -XL   traMADol  50 MG tablet Commonly known as: ULTRAM        TAKE these medications    albuterol  108 (90 Base) MCG/ACT inhaler Commonly known as: VENTOLIN  HFA Inhale 2 puffs into the lungs every 6 (six) hours as needed for wheezing or shortness of breath.   amiodarone  200 MG tablet Commonly known as: PACERONE  Take 1 tablet (200 mg total) by mouth 2 (two) times daily.   amLODipine  5 MG tablet Commonly known as: NORVASC  Take 1 tablet (5 mg total) by mouth daily. Start taking on: May 03, 2024 What changed:  medication strength how much to take   aspirin  81 MG chewable tablet Chew 81 mg by mouth daily.   Azelastine  HCl 137 MCG/SPRAY Soln INSTILL 2 SPRAYS INTO BOTH NOSTRILS 2 TIMES DAILY AS DIRECTED What changed: See the new instructions.   cetirizine 10 MG  tablet Commonly known as: ZYRTEC Take 10 mg by mouth daily.   esomeprazole  40 MG capsule Commonly known as: NEXIUM   TAKE 1 CAPSULE BY MOUTH DAILY BEFORE A MEAL   ezetimibe  10 MG tablet Commonly known as: ZETIA  TAKE 1 TABLET BY MOUTH EVERY DAY   fluticasone  50 MCG/ACT nasal spray Commonly known as: FLONASE  Place 2 sprays into both nostrils 2 (two) times daily. What changed:  when to take this reasons to take this   Integra Plus  Caps TAKE 1 CAPSULE BY MOUTH DAILY   LORazepam  0.5 MG tablet Commonly known as: Ativan  Take 0.5 mg daily as needed   Lumigan 0.01 % Soln Generic drug: bimatoprost Place 1 drop into both eyes at bedtime.   montelukast  10 MG tablet Commonly known as: SINGULAIR  TAKE 1 TABLET BY MOUTH EVERYDAY AT BEDTIME   multivitamin with minerals Tabs tablet Take 1 tablet by mouth daily. Adult 50+   nitroGLYCERIN  0.4 MG SL tablet Commonly known as: Nitrostat  Place 1 tablet (0.4 mg total) under the tongue every 5 (five) minutes as needed for chest pain (up to 3 doses. not within 24hrs of Viagra ).   oxyCODONE  5 MG immediate release tablet Commonly known as: Oxy IR/ROXICODONE  Take 1 tablet (5 mg total) by mouth every 4 (four) hours as needed for severe pain (pain score 7-10).   potassium chloride  SA 20 MEQ tablet Commonly known as: KLOR-CON  M Take 1 tablet (20 mEq total) by mouth daily.   pravastatin  10 MG tablet Commonly known as: PRAVACHOL  TAKE 1 TABLET EVERY OTHER DAY, ALTERNATING WITH 2 TABLETS.   tamsulosin  0.4 MG Caps capsule Commonly known as: FLOMAX  Take 1 capsule (0.4 mg total) by mouth daily after breakfast. Start taking on: May 03, 2024   torsemide  20 MG tablet Commonly known as: DEMADEX  Take 1 tablet (20 mg total) by mouth daily. Start taking on: May 03, 2024   VITAMIN B 12 PO Take 1 tablet by mouth daily.               Durable Medical Equipment  (From admission, onward)           Start     Ordered   05/02/24 0918   For home use only DME Tub bench  Once        05/02/24 9081            Follow-up Information     Jordan, Peter M, MD. Go on 05/12/2024.   Specialty: Cardiology Why: Appointment time is at 9:20 am Contact information: [address] [place] [address] [phone]         Rutha Manuelita HERO, [name]. Go on 05/12/2024.   Specialties: Physician Assistant, Thoracic Surgery Why: Please arrive by 9:30 am in order to have PA/LAT CXR taken PRIOR to office appointment with L. Fenyak. Appointment time is at 10:30 am Contact information: [address] [place] [address] [phone]                 Signed:  Rocky Shad, [name] 05/02/2024, 9:25 AM

## 2024-04-26 NOTE — Anesthesia Procedure Notes (Signed)
 Procedure Name: Intubation Date/Time: 04/26/2024 7:57 AM  Performed by: Elby Raelene SAUNDERS, CRNAPre-anesthesia Checklist: Patient identified, Emergency Drugs available, Suction available and Patient being monitored Patient Re-evaluated:Patient Re-evaluated prior to induction Oxygen Delivery Method: Circle System Utilized Preoxygenation: Pre-oxygenation with 100% oxygen Induction Type: IV induction Ventilation: Mask ventilation without difficulty Laryngoscope Size: Miller and 2 Grade View: Grade II Tube type: Oral Tube size: 8.0 mm Number of attempts: 1 Airway Equipment and Method: Stylet, Oral airway and Bite block Placement Confirmation: ETT inserted through vocal cords under direct vision, positive ETCO2 and breath sounds checked- equal and bilateral Secured at: 24 cm Tube secured with: Tape Dental Injury: Teeth and Oropharynx as per pre-operative assessment

## 2024-04-26 NOTE — Transfer of Care (Signed)
 Immediate Anesthesia Transfer of Care Note  Patient: Todd Spears  Procedure(s) Performed: CORONARY ARTERY BYPASS GRAFTING (CABG) TIMES 4 USING LEFT INTERNAL MAMMARY ARTERY AND ENDOSCOPICALLY HARVESTED RIGHT GREATER SAPHENOUS VEIN (Chest) REPLACEMENT, AORTIC VALVE, OPEN (Chest) ECHOCARDIOGRAM, TRANSESOPHAGEAL, INTRAOPERATIVE  Patient Location: SICU  Anesthesia Type:General  Level of Consciousness: sedated and Patient remains intubated per anesthesia plan  Airway & Oxygen Therapy: Patient remains intubated per anesthesia plan and    Post-op Assessment: Report given to RN and Post -op Vital signs reviewed and stable  Post vital signs: Reviewed and stable  Last Vitals:  Vitals Value Taken Time  BP 98/65   Temp 36.1 C 04/26/24 13:34  Pulse 80 04/26/24 13:34  Resp 23 04/26/24 13:34  SpO2 92 % 04/26/24 13:34  Vitals shown include unfiled device data.  Last Pain:  Vitals:   04/26/24 0553  TempSrc: (P) Oral         Complications: No notable events documented.

## 2024-04-26 NOTE — Anesthesia Postprocedure Evaluation (Signed)
 Anesthesia Post Note  Patient: Todd Spears  Procedure(s) Performed: CORONARY ARTERY BYPASS GRAFTING (CABG) TIMES 4 USING LEFT INTERNAL MAMMARY ARTERY AND ENDOSCOPICALLY HARVESTED RIGHT GREATER SAPHENOUS VEIN (Chest) REPLACEMENT, AORTIC VALVE, OPEN (Chest) ECHOCARDIOGRAM, TRANSESOPHAGEAL, INTRAOPERATIVE     Patient location during evaluation: SICU Anesthesia Type: General Level of consciousness: sedated Pain management: pain level controlled Vital Signs Assessment: post-procedure vital signs reviewed and stable Respiratory status: patient remains intubated per anesthesia plan Cardiovascular status: stable Postop Assessment: no apparent nausea or vomiting Anesthetic complications: no Comments: - NE and Milrinone  gtt   No notable events documented.  Last Vitals:  Vitals:   04/26/24 1415 04/26/24 1430  BP:    Pulse: 80 80  Resp: 16 16  Temp: (!) 35.8 C (!) 35.9 C  SpO2: 97% 97%    Last Pain:  Vitals:   04/26/24 1335  TempSrc: Core                 Todd Spears

## 2024-04-26 NOTE — Brief Op Note (Signed)
 04/26/2024  11:44 AM  PATIENT:  Todd Spears  78 y.o. male  PRE-OPERATIVE DIAGNOSIS:  CORONARY ARTERY DISEASE AORTIC STENOSIS  POST-OPERATIVE DIAGNOSIS:  CORONARY ARTERY DISEASE AORTIC STENOSIS  PROCEDURE:  INTRAOPERATIVE TRANSESOPHAGEAL ECHOCARDIOGRAM, CORONARY ARTERY BYPASS GRAFTING (CABG) TIMES x 4 (LIMA to LAD, SVG to DIAGONAL, SVG to OM, SVG to DISTAL RCA) USING LEFT INTERNAL MAMMARY ARTERY AND ENDOSCOPICALLY HARVESTED RIGHT GREATER SAPHENOUS VEIN   Vein harvest time: 52 min Vein prep time: 16 min  SURGEON:  Surgeons and Role:    Lucas Dorise POUR, MD - Primary  PHYSICIAN ASSISTANT: Kyla Donald [name]  ASSISTANTS: Mariel Mayotte RNFA   ANESTHESIA:   general  EBL:  Per perfusion, anesthesia record  DRAINS: Chest tubes placed in the mediastinal and pleural spaces   COUNTS CORRECT:  YES  DICTATION: .Dragon Dictation  PLAN OF CARE: Admit to inpatient   PATIENT DISPOSITION:  ICU - intubated and hemodynamically stable.   Delay start of Pharmacological VTE agent (>24hrs) due to surgical blood loss or risk of bleeding: no  BASELINE WEIGHT: 111.6 kg

## 2024-04-26 NOTE — Anesthesia Procedure Notes (Signed)
 Central Venous Catheter Insertion Performed by: Tilford Franky BIRCH, MD, anesthesiologist Start/End11/24/2025 6:45 AM, 04/26/2024 6:55 AM Patient location: Pre-op. Preanesthetic checklist: patient identified, IV checked, site marked, risks and benefits discussed, surgical consent, monitors and equipment checked, pre-op evaluation, timeout performed and anesthesia consent Position: Trendelenburg Lidocaine  1% used for infiltration and patient sedated Hand hygiene performed , maximum sterile barriers used  and Seldinger technique used Catheter size: 9 Fr Total catheter length 10. Central line was placed.Sheath introducer Swan type:thermodilution PA Cath depth:50 Procedure performed using ultrasound to evaluate access site. Ultrasound Notes:relevant anatomy identified, ultrasound used to visualize needle entry, vessel patent under ultrasound and image(s) printed for medical record. Attempts: 1 Following insertion, line sutured, dressing applied and Biopatch. Post procedure assessment: blood return through all ports, free fluid flow and no air  Patient tolerated the procedure well with no immediate complications.

## 2024-04-26 NOTE — Interval H&P Note (Signed)
 History and Physical Interval Note:  04/26/2024 6:29 AM  Todd Spears  has presented today for surgery, with the diagnosis of CAD AS.  The various methods of treatment have been discussed with the patient and family. After consideration of risks, benefits and other options for treatment, the patient has consented to  Procedure(s): CORONARY ARTERY BYPASS GRAFTING (CABG) (N/A) REPLACEMENT, AORTIC VALVE, OPEN (N/A) ECHOCARDIOGRAM, TRANSESOPHAGEAL, INTRAOPERATIVE (N/A) as a surgical intervention.  The patient's history has been reviewed, patient examined, no change in status, stable for surgery.  I have reviewed the patient's chart and labs.  Questions were answered to the patient's satisfaction.     Arcadio Cope K Kevonte Vanecek

## 2024-04-26 NOTE — Anesthesia Procedure Notes (Signed)
 Arterial Line Insertion Start/End11/24/2025 7:00 AM, 04/26/2024 7:05 AM Performed by: Tilford Franky BIRCH, MD  Patient location: Pre-op. Preanesthetic checklist: patient identified, IV checked, site marked, risks and benefits discussed, surgical consent, monitors and equipment checked, pre-op evaluation, timeout performed and anesthesia consent Lidocaine  1% used for infiltration Left, radial was placed Catheter size: 20 G Hand hygiene performed  and maximum sterile barriers used   Attempts: 1 Following insertion, dressing applied and Biopatch. Post procedure assessment: normal and unchanged  Post procedure complications: second provider assisted. Additional procedure comments: CRNA x2, MDA x1.

## 2024-04-27 ENCOUNTER — Inpatient Hospital Stay (HOSPITAL_COMMUNITY)

## 2024-04-27 ENCOUNTER — Encounter (HOSPITAL_COMMUNITY): Payer: Self-pay | Admitting: Surgery

## 2024-04-27 DIAGNOSIS — Z951 Presence of aortocoronary bypass graft: Secondary | ICD-10-CM

## 2024-04-27 DIAGNOSIS — Z952 Presence of prosthetic heart valve: Secondary | ICD-10-CM

## 2024-04-27 DIAGNOSIS — I350 Nonrheumatic aortic (valve) stenosis: Secondary | ICD-10-CM

## 2024-04-27 LAB — GLUCOSE, CAPILLARY
Glucose-Capillary: 107 mg/dL — ABNORMAL HIGH (ref 70–99)
Glucose-Capillary: 113 mg/dL — ABNORMAL HIGH (ref 70–99)
Glucose-Capillary: 120 mg/dL — ABNORMAL HIGH (ref 70–99)
Glucose-Capillary: 122 mg/dL — ABNORMAL HIGH (ref 70–99)
Glucose-Capillary: 127 mg/dL — ABNORMAL HIGH (ref 70–99)
Glucose-Capillary: 131 mg/dL — ABNORMAL HIGH (ref 70–99)
Glucose-Capillary: 135 mg/dL — ABNORMAL HIGH (ref 70–99)
Glucose-Capillary: 136 mg/dL — ABNORMAL HIGH (ref 70–99)
Glucose-Capillary: 137 mg/dL — ABNORMAL HIGH (ref 70–99)
Glucose-Capillary: 146 mg/dL — ABNORMAL HIGH (ref 70–99)
Glucose-Capillary: 146 mg/dL — ABNORMAL HIGH (ref 70–99)

## 2024-04-27 LAB — BASIC METABOLIC PANEL WITH GFR
Anion gap: 10 (ref 5–15)
Anion gap: 11 (ref 5–15)
BUN: 22 mg/dL (ref 8–23)
BUN: 25 mg/dL — ABNORMAL HIGH (ref 8–23)
CO2: 19 mmol/L — ABNORMAL LOW (ref 22–32)
CO2: 19 mmol/L — ABNORMAL LOW (ref 22–32)
Calcium: 7.9 mg/dL — ABNORMAL LOW (ref 8.9–10.3)
Calcium: 8.2 mg/dL — ABNORMAL LOW (ref 8.9–10.3)
Chloride: 107 mmol/L (ref 98–111)
Chloride: 105 mmol/L (ref 98–111)
Creatinine, Ser: 1.52 mg/dL — ABNORMAL HIGH (ref 0.61–1.24)
Creatinine, Ser: 1.53 mg/dL — ABNORMAL HIGH (ref 0.61–1.24)
GFR, Estimated: 47 mL/min — ABNORMAL LOW (ref >60)
GFR, Estimated: 46 mL/min — ABNORMAL LOW (ref >60)
Glucose, Bld: 118 mg/dL — ABNORMAL HIGH (ref 70–99)
Glucose, Bld: 148 mg/dL — ABNORMAL HIGH (ref 70–99)
Potassium: 3.9 mmol/L (ref 3.5–5.1)
Potassium: 4.2 mmol/L (ref 3.5–5.1)
Sodium: 136 mmol/L (ref 135–145)
Sodium: 135 mmol/L (ref 135–145)

## 2024-04-27 LAB — CBC
HCT: 28.5 % — ABNORMAL LOW (ref 39.0–52.0)
HCT: 30.9 % — ABNORMAL LOW (ref 39.0–52.0)
Hemoglobin: 9.4 g/dL — ABNORMAL LOW (ref 13.0–17.0)
Hemoglobin: 10.1 g/dL — ABNORMAL LOW (ref 13.0–17.0)
MCH: 32.3 pg (ref 26.0–34.0)
MCH: 32.1 pg (ref 26.0–34.0)
MCHC: 33 g/dL (ref 30.0–36.0)
MCHC: 32.7 g/dL (ref 30.0–36.0)
MCV: 97.9 fL (ref 80.0–100.0)
MCV: 98.1 fL (ref 80.0–100.0)
Platelets: 174 K/uL (ref 150–400)
Platelets: 184 K/uL (ref 150–400)
RBC: 2.91 MIL/uL — ABNORMAL LOW (ref 4.22–5.81)
RBC: 3.15 MIL/uL — ABNORMAL LOW (ref 4.22–5.81)
RDW: 13.4 % (ref 11.5–15.5)
RDW: 13.6 % (ref 11.5–15.5)
WBC: 8.9 K/uL (ref 4.0–10.5)
WBC: 11.5 K/uL — ABNORMAL HIGH (ref 4.0–10.5)
nRBC: 0 % (ref 0.0–0.2)
nRBC: 0 % (ref 0.0–0.2)

## 2024-04-27 LAB — MAGNESIUM
Magnesium: 2.8 mg/dL — ABNORMAL HIGH (ref 1.7–2.4)
Magnesium: 2.7 mg/dL — ABNORMAL HIGH (ref 1.7–2.4)

## 2024-04-27 MED ORDER — CHLORHEXIDINE GLUCONATE CLOTH 2 % EX PADS
6.0000 | MEDICATED_PAD | Freq: Every day | CUTANEOUS | Status: DC
  Administered 2024-04-28 – 2024-05-02 (×5): 6 via TOPICAL

## 2024-04-27 MED ORDER — INSULIN GLARGINE-YFGN 100 UNIT/ML ~~LOC~~ SOLN
11.0000 [IU] | Freq: Every day | SUBCUTANEOUS | Status: DC
  Administered 2024-04-27 – 2024-04-30 (×4): 11 [IU] via SUBCUTANEOUS
  Filled 2024-04-27 (×5): qty 0.11

## 2024-04-27 MED ORDER — FENTANYL CITRATE (PF) 50 MCG/ML IJ SOSY
25.0000 ug | PREFILLED_SYRINGE | Freq: Once | INTRAMUSCULAR | Status: AC
  Administered 2024-04-27: 25 ug via INTRAVENOUS
  Filled 2024-04-27: qty 1

## 2024-04-27 MED ORDER — INSULIN REGULAR(HUMAN) IN NACL 100-0.9 UT/100ML-% IV SOLN: INTRAVENOUS | Status: AC

## 2024-04-27 MED ORDER — ENOXAPARIN SODIUM 40 MG/0.4ML IJ SOSY
40.0000 mg | PREFILLED_SYRINGE | Freq: Every day | INTRAMUSCULAR | Status: DC
  Administered 2024-04-27 – 2024-05-01 (×5): 40 mg via SUBCUTANEOUS
  Filled 2024-04-27 (×5): qty 0.4

## 2024-04-27 MED ORDER — INSULIN ASPART 100 UNIT/ML IJ SOLN
0.0000 [IU] | INTRAMUSCULAR | Status: DC
  Administered 2024-04-27 – 2024-04-28 (×5): 2 [IU] via SUBCUTANEOUS
  Filled 2024-04-27 (×5): qty 2

## 2024-04-27 MED FILL — Heparin Sodium (Porcine) Inj 1000 Unit/ML: INTRAMUSCULAR | Qty: 10 | Status: AC

## 2024-04-27 MED FILL — Lidocaine HCl Local Soln Prefilled Syringe 100 MG/5ML (2%): INTRAMUSCULAR | Qty: 5 | Status: AC

## 2024-04-27 MED FILL — Potassium Chloride Inj 2 mEq/ML: INTRAVENOUS | Qty: 40 | Status: AC

## 2024-04-27 MED FILL — Sodium Chloride IV Soln 0.9%: INTRAVENOUS | Qty: 1000 | Status: AC

## 2024-04-27 MED FILL — Heparin Sodium (Porcine) Inj 1000 Unit/ML: Qty: 1000 | Status: AC

## 2024-04-27 MED FILL — Lidocaine HCl Local Preservative Free (PF) Inj 2%: INTRAMUSCULAR | Qty: 14 | Status: AC

## 2024-04-27 MED FILL — Thrombin (Recombinant) For Soln 20000 Unit: CUTANEOUS | Qty: 1 | Status: AC

## 2024-04-27 MED FILL — Sodium Bicarbonate IV Soln 8.4%: INTRAVENOUS | Qty: 50 | Status: AC

## 2024-04-27 MED FILL — Mannitol IV Soln 20%: INTRAVENOUS | Qty: 500 | Status: AC

## 2024-04-27 MED FILL — Electrolyte-R (PH 7.4) Solution: INTRAVENOUS | Qty: 4000 | Status: AC

## 2024-04-27 NOTE — Plan of Care (Signed)

## 2024-04-27 NOTE — TOC Initial Note (Signed)
 Transition of Care [facility]) - Initial/Assessment Note    Patient Details  Name: Todd Spears MRN: [id number] Date of Birth: 1945-11-16  Transition of Care [facility]) CM/SW Contact:    Sudie Erminio Deems, RN Phone Number: 04/27/2024, 4:30 PM  Clinical Narrative:  Patient POD-1 CABG. PTA patient was independent from home with spouse and has support of son that is at the bedside. Patient states he has a PCP and insurance. Patient has DME: rolling walker, toilet riser, and chair lift. ICM will continue to follow for additional disposition needs as the patient progresses.                  Expected Discharge Plan: Home w Home Health Services Barriers to Discharge: Continued Medical Work up    Living arrangements for the past 2 months: Single Family Home                   DME Agency: NA       HH Arranged: NA  Prior Living Arrangements/Services Living arrangements for the past 2 months: Single Family Home Lives with:: Spouse (has suppor tof son) Patient language and need for interpreter reviewed:: Yes Do you feel safe going back to the place where you live?: Yes      Need for Family Participation in Patient Care: Yes (Comment) Care giver support system in place?: Yes (comment) Current home services: DME (rolling walker, chair lift, toilet riser.) Criminal Activity/Legal Involvement Pertinent to Current Situation/Hospitalization: No - Comment as needed  Activities of Daily Living      Permission Sought/Granted Permission sought to share information with : Case Manager, Family Supports                Emotional Assessment Appearance:: Appears stated age Attitude/Demeanor/Rapport: Engaged Affect (typically observed): Appropriate Orientation: : Oriented to Self, Oriented to Place, Oriented to  Time, Oriented to Situation Alcohol / Substance Use: Not Applicable Psych Involvement: No (comment)  Admission diagnosis:  Coronary artery disease involving native coronary artery  of native heart without angina pectoris [I25.10] Aortic valve stenosis, etiology of cardiac valve disease unspecified [I35.0] Coronary artery disease [I25.10] Patient Active Problem List   Diagnosis Date Noted   Coronary artery disease 04/26/2024   S/P CABG x 4 04/26/2024   Eustachian tube dysfunction 12/09/2023   Hyperglycemia 04/13/2020   Pain due to onychomycosis of toenails of both feet 11/18/2018   Porokeratosis 11/18/2018   Gout 06/18/2018   Osteoarthritis 06/18/2018   Carotid arterial disease 08/10/2015   Aortic stenosis, mild 08/10/2015   CKD (chronic kidney disease) stage 2, GFR 60-89 ml/min 07/03/2015   CAD s/p PCI 2013    Hyperlipidemia 11/27/2006   Essential hypertension 11/27/2006   Allergic rhinitis 11/27/2006   GERD 11/27/2006   PCP:  Kennyth Worth HERO, MD Pharmacy:   [id number] - [place],  - [address] [place] [address] Phone: [phone] Fax: [phone]  [facility] [id number] [place], [place] - [address] [place] [address] Phone: [phone] Fax: [phone]     Social Drivers of Health (SDOH) Social History: SDOH Screenings   Food Insecurity: No Food Insecurity (12/08/2023)  Housing: Unknown (12/08/2023)  Transportation Needs: No Transportation Needs (12/08/2023)  Utilities: Not At Risk (07/03/2023)  Alcohol Screen: Low Risk  (07/03/2023)  Depression (PHQ2-9): Low Risk  (12/16/2023)  Financial Resource Strain: Low Risk  (12/08/2023)  Physical Activity: Insufficiently Active (12/08/2023)  Social Connections: Socially Integrated (12/08/2023)  Stress: No Stress Concern Present (12/08/2023)  Tobacco Use: Medium Risk (04/26/2024)  Health Literacy: Adequate Health Literacy (07/03/2023)   SDOH Interventions:     Readmission Risk Interventions     No data to display

## 2024-04-27 NOTE — Procedures (Signed)
 Modified Barium Swallow Study  Patient Details  Name: Todd Spears MRN: [id number] Date of Birth: [birth year]  Today's Date: 04/27/2024  Modified Barium Swallow completed.  Full report located under Chart Review in the Imaging Section.  History of Present Illness Patient is a 78 y.o. male who presented to [facility] on 04/26/24 for CABGx4, replacement of aortic valve secondary to diagnosis of CAD. He was intubated for surgery and extubated the same day. He failed the Yale swallow screen with RN on 11/24 and was kept NPO. SLP BSE on 11/25 revealed clinical s/s dysphagia. MBS ordered to further assess swallow. PMH: HTN, HLD, CKD, CAD, GERD, OA.   Clinical Impression Patient presents with a mild oral and pharyngoesophageal phase dysphagia as per this MBS. No aspiration or penetration was observed with any of the tested consistencies. Oral impairments include delayed swallow initiation to the valleculae. Pharyngeal impairments include trace pharyngeal residue on the tongue base and valleculae. Esophageal impairments include esophageal retention with retrograde flow below pharyngoesophageal segment (PES). SLP recommending Dys 3 (mechanical soft) diet with thin liquids. SLP will continue to follow for diet toleration. Factors that may increase risk of adverse event in presence of aspiration Noe & Lianne 2021):    DIGEST Swallow Severity Rating*  Safety: 0  Efficiency:1  Overall Pharyngeal Swallow Severity: 1 (mild) 1: mild; 2: moderate; 3: severe; 4: profound  *The Dynamic Imaging Grade of Swallowing Toxicity is standardized for the head and neck cancer population, however, demonstrates promising clinical applications across populations to standardize the clinical rating of pharyngeal swallow safety and severity.  Swallow Evaluation Recommendations Recommendations: PO diet PO Diet Recommendation: Dysphagia 3 (Mechanical soft);Thin liquids (Level 0) Liquid Administration via: Cup;Straw Medication  Administration: Other (Comment) (As tolerated.) Supervision: Patient able to self-feed Swallowing strategies  : Minimize environmental distractions;Slow rate;Small bites/sips Postural changes: Position pt fully upright for meals;Stay upright 30-60 min after meals Oral care recommendations: Oral care BID (2x/day)    Damien Hy  Graduate SLP Clinican

## 2024-04-27 NOTE — Progress Notes (Signed)
 04/27/2024 Brief PCCM note Seen for post CABG x 4 with planned later TAVR after recovery Labs/imaging reviewed. Patient examined Pulling 1250 on IS Having some dysphagia to get MBSS Pain better controlled with dose of oxycodone  If Cr stable in AM would probably try to diurese if not autodiuresing Will follow while in ICU  Rolan Sharps MD PCCM

## 2024-04-27 NOTE — Progress Notes (Signed)
 1 Day Post-Op Procedure(s) (LRB): CORONARY ARTERY BYPASS GRAFTING (CABG) TIMES 4 USING LEFT INTERNAL MAMMARY ARTERY AND ENDOSCOPICALLY HARVESTED RIGHT GREATER SAPHENOUS VEIN (N/A) REPLACEMENT, AORTIC VALVE, OPEN (N/A) ECHOCARDIOGRAM, TRANSESOPHAGEAL, INTRAOPERATIVE (N/A) Subjective: Complains of not being able to get comfortable.  Objective: Vital signs in last 24 hours: Temp:  [96.3 F (35.7 C)-99.7 F (37.6 C)] 99.5 F (37.5 C) (11/25 0645) Pulse Rate:  [43-84] 80 (11/25 0645) Cardiac Rhythm: Atrial paced (11/25 0400) Resp:  [13-38] 19 (11/25 0645) BP: (86-121)/(52-72) 118/60 (11/25 0600) SpO2:  [91 %-98 %] 94 % (11/25 0645) Arterial Line BP: (92-146)/(36-61) 129/50 (11/25 0645) FiO2 (%):  [40 %-50 %] 40 % (11/24 1610) Weight:  [114.7 kg] 114.7 kg (11/25 0442)  Hemodynamic parameters for last 24 hours: PAP: (36-55)/(13-26) 41/16 CVP:  [3 mmHg-19 mmHg] 13 mmHg CO:  [5.2 L/min-6.8 L/min] 5.2 L/min CI:  [2.36 L/min/m2-3.1 L/min/m2] 2.36 L/min/m2  Intake/Output from previous day: 11/24 0701 - 11/25 0700 In: 6044.1 [I.V.:4198.1; Blood:255; IV Piggyback:1591.1] Out: 2277 [Urine:1391; Blood:546; Chest Tube:340] Intake/Output this shift: No intake/output data recorded.  General appearance: alert and cooperative Neurologic: intact Heart: regular rate and rhythm, systolic murmur Lungs: clear to auscultation bilaterally Extremities: edema mild Wound: dressing dry  Lab Results: Recent Labs    04/26/24 1904 04/27/24 0504  WBC 13.9* 8.9  HGB 10.0* 9.4*  HCT 30.5* 28.5*  PLT 231 174   BMET:  Recent Labs    04/26/24 1904 04/27/24 0504  NA 138 136  K 3.8 3.9  CL 103 107  CO2 21* 19*  GLUCOSE 169* 118*  BUN 22 22  CREATININE 1.46* 1.52*  CALCIUM  8.1* 7.9*    PT/INR:  Recent Labs    04/26/24 1341  LABPROT 17.5*  INR 1.4*   ABG    Component Value Date/Time   PHART 7.327 (L) 04/26/2024 1821   HCO3 20.9 04/26/2024 1821   TCO2 22 04/26/2024 1821   ACIDBASEDEF  5.0 (H) 04/26/2024 1821   O2SAT 97 04/26/2024 1821   CBG (last 3)  Recent Labs    04/27/24 0400 04/27/24 0459 04/27/24 0658  GLUCAP 131* 120* 122*   CXR: bibasilar atelectasis.  ECG: sinus, 1st degree AV block, no acute changes.  Assessment/Plan: S/P Procedure(s) (LRB): CORONARY ARTERY BYPASS GRAFTING (CABG) TIMES 4 USING LEFT INTERNAL MAMMARY ARTERY AND ENDOSCOPICALLY HARVESTED RIGHT GREATER SAPHENOUS VEIN (N/A) REPLACEMENT, AORTIC VALVE, OPEN (N/A) ECHOCARDIOGRAM, TRANSESOPHAGEAL, INTRAOPERATIVE (N/A)  POD 1 Hemodynamically stable in NSR. Keep pacing wires in today and hold off on Lopressor .  Dangle and DC chest tubes.  DC swan and arterial line.  Severe aortic stenosis: plan TAVR later once he recovers.  Stage 3a CKD. Follow. Hold off on diuresis for now.  DM: preop Hgb A1c 5.5. Will transition to SSI and SEMGLEE .  IS, OOB, mobilize.   LOS: 1 day    Todd Spears 04/27/2024

## 2024-04-27 NOTE — Evaluation (Signed)
 Clinical/Bedside Swallow Evaluation Patient Details  Name: Todd Spears MRN: [id number] Date of Birth: July 06, 1945  Today's Date: 04/27/2024 Time: SLP Start Time (ACUTE ONLY): 1139 SLP Stop Time (ACUTE ONLY): 1203 SLP Time Calculation (min) (ACUTE ONLY): 24 min  Past Medical History:  Past Medical History:  Diagnosis Date   Allergic rhinitis    Anemia    Aortic stenosis    Asthma    CAD (coronary artery disease)    a. s/p PTCA/stenting of the mid LAD and mid RCA 04/06/12   Carotid bruit    r   Colon polyp    Diverticulosis    ED (erectile dysfunction)    Fatty liver    GERD (gastroesophageal reflux disease)    Gout    Hyperlipidemia    Hypertension    Obesity    Osteoarthritis    Renal insufficiency    Restless leg syndrome    Past Surgical History:  Past Surgical History:  Procedure Laterality Date   AORTIC VALVE REPLACEMENT N/A 04/26/2024   Procedure: REPLACEMENT, AORTIC VALVE, OPEN;  Surgeon: Lucas Dorise POUR, MD;  Location: [facility] OR;  Service: Open Heart Surgery;  Laterality: N/A;   BACK SURGERY  2020   COLONOSCOPY  04/23/2016   Dr.Perry   CORONARY ARTERY BYPASS GRAFT N/A 04/26/2024   Procedure: CORONARY ARTERY BYPASS GRAFTING (CABG) TIMES 4 USING LEFT INTERNAL MAMMARY ARTERY AND ENDOSCOPICALLY HARVESTED RIGHT GREATER SAPHENOUS VEIN;  Surgeon: Lucas Dorise POUR, MD;  Location: [facility] OR;  Service: Open Heart Surgery;  Laterality: N/A;   INGUINAL HERNIA REPAIR Right 02/27/2016   Procedure: HERNIA REPAIR INGUINAL ADULT;  Surgeon: Donnice Bury, MD;  Location: [facility];  Service: General;  Laterality: Right;   INSERTION OF MESH Right 02/27/2016   Procedure: INSERTION OF MESH--IN AND OUT CATHETERIZATION PERFORMED AT END OF PROCEDURE TO DRAIN URINE.;  Surgeon: Donnice Bury, MD;  Location: [facility];  Service: General;  Laterality: Right;   INTRAOPERATIVE TRANSESOPHAGEAL ECHOCARDIOGRAM N/A 04/26/2024   Procedure: ECHOCARDIOGRAM,  TRANSESOPHAGEAL, INTRAOPERATIVE;  Surgeon: Lucas Dorise POUR, MD;  Location: [facility] OR;  Service: Open Heart Surgery;  Laterality: N/A;   LEFT HEART CATH AND CORONARY ANGIOGRAPHY N/A 03/19/2024   Procedure: LEFT HEART CATH AND CORONARY ANGIOGRAPHY;  Surgeon: Anner Alm ORN, MD;  Location: [facility] INVASIVE CV LAB;  Service: Cardiovascular;  Laterality: N/A;   LYMPH NODE BIOPSY     right side of neck 1999 - benign   PERCUTANEOUS CORONARY STENT INTERVENTION (PCI-S) N/A 04/06/2012   Procedure: PERCUTANEOUS CORONARY STENT INTERVENTION (PCI-S);  Surgeon: Peter M Jordan, MD; Promus DES for 90% mid LAD and 95% mid RCA disease     UPPER GASTROINTESTINAL ENDOSCOPY  04/23/2016   Dr.Perry   HPI:  Patient is a Todd Spears who presented to [facility] on 04/26/24 for CABGx4, replacement of aortic valve secondary to diagnosis of CAD. He was intubated for surgery and extubated the same day. He failed the Yale swallow screen with RN on 11/24 and was kept NPO and SLP swallow evaluation was ordered. PMH: HTN, HLD, CKD, CAD, GERD, OA.    Assessment / Plan / Recommendation  Clinical Impression  Patient presents with clinical s/s of dysphagia as per this BSE. He exhibited decreased hyolaryngeal elevation and delayed cough with thin liquids. Shortly after PO's, patient exhbited a few instances of hiccups which he indicated was not baseline despite him having h/o GERD. Patient overall is weak and fatigues easily, has a weak cough, making him at  risk for decompensation if he were to aspirate. SLP recommending continue NPO status but allow water sips, necessary oral meds and will proceed with MBS this PM. SLP Visit Diagnosis: Dysphagia, unspecified (R13.10)    Aspiration Risk  Mild aspiration risk    Diet Recommendation NPO;Free water protocol after oral care;NPO except meds    Medication Administration: Whole meds with liquid Postural Changes: Seated upright at 90 degrees    Other  Recommendations Oral Care Recommendations: Oral  care BID;Oral care prior to ice chip/H20     Assistance Recommended at Discharge    Functional Status Assessment Patient has had a recent decline in their functional status and demonstrates the ability to make significant improvements in function in a reasonable and predictable amount of time.  Frequency and Duration min 2x/week  1 week       Prognosis Prognosis for improved oropharyngeal function: Good      Swallow Study   General Date of Onset: 04/26/24 HPI: Patient is a Todd Spears who presented to [facility] on 04/26/24 for CABGx4, replacement of aortic valve secondary to diagnosis of CAD. He was intubated for surgery and extubated the same day. He failed the Yale swallow screen with RN on 11/24 and was kept NPO and SLP swallow evaluation was ordered. PMH: HTN, HLD, CKD, CAD, GERD, OA. Type of Study: Bedside Swallow Evaluation Previous Swallow Assessment: none found Diet Prior to this Study: NPO Temperature Spikes Noted: No Respiratory Status: Nasal cannula History of Recent Intubation: Yes Total duration of intubation (days): 1 days (intubated and extubated same day) Date extubated: 04/26/24 Behavior/Cognition: Alert;Cooperative;Pleasant mood Oral Cavity Assessment: Dry Oral Care Completed by SLP: Recent completion by staff Oral Cavity - Dentition: Adequate natural dentition Vision: Functional for self-feeding Self-Feeding Abilities: Needs assist Patient Positioning: Upright in chair Baseline Vocal Quality: Normal Volitional Cough: Weak Volitional Swallow: Able to elicit    Oral/Motor/Sensory Function Overall Oral Motor/Sensory Function: Within functional limits   Ice Chips Ice chips: Within functional limits   Thin Liquid Thin Liquid: Impaired Presentation: Straw Pharyngeal  Phase Impairments: Decreased hyoid-laryngeal movement;Cough - Delayed    Nectar Thick     Honey Thick     Puree Puree: Within functional limits Presentation: Spoon   Solid     Solid: Not tested       Norleen IVAR Blase, MA, CCC-SLP Speech Therapy

## 2024-04-28 ENCOUNTER — Inpatient Hospital Stay (HOSPITAL_COMMUNITY)

## 2024-04-28 DIAGNOSIS — Z9889 Other specified postprocedural states: Secondary | ICD-10-CM | POA: Diagnosis not present

## 2024-04-28 DIAGNOSIS — Z951 Presence of aortocoronary bypass graft: Secondary | ICD-10-CM | POA: Diagnosis not present

## 2024-04-28 DIAGNOSIS — Z48812 Encounter for surgical aftercare following surgery on the circulatory system: Secondary | ICD-10-CM | POA: Diagnosis not present

## 2024-04-28 DIAGNOSIS — I272 Pulmonary hypertension, unspecified: Secondary | ICD-10-CM | POA: Diagnosis not present

## 2024-04-28 LAB — GLUCOSE, CAPILLARY
Glucose-Capillary: 123 mg/dL — ABNORMAL HIGH (ref 70–99)
Glucose-Capillary: 107 mg/dL — ABNORMAL HIGH (ref 70–99)
Glucose-Capillary: 140 mg/dL — ABNORMAL HIGH (ref 70–99)
Glucose-Capillary: 102 mg/dL — ABNORMAL HIGH (ref 70–99)
Glucose-Capillary: 123 mg/dL — ABNORMAL HIGH (ref 70–99)
Glucose-Capillary: 93 mg/dL (ref 70–99)

## 2024-04-28 LAB — BASIC METABOLIC PANEL WITH GFR
Anion gap: 12 (ref 5–15)
BUN: 29 mg/dL — ABNORMAL HIGH (ref 8–23)
CO2: 19 mmol/L — ABNORMAL LOW (ref 22–32)
Calcium: 8.2 mg/dL — ABNORMAL LOW (ref 8.9–10.3)
Chloride: 107 mmol/L (ref 98–111)
Creatinine, Ser: 1.62 mg/dL — ABNORMAL HIGH (ref 0.61–1.24)
GFR, Estimated: 43 mL/min — ABNORMAL LOW (ref >60)
Glucose, Bld: 125 mg/dL — ABNORMAL HIGH (ref 70–99)
Potassium: 4.2 mmol/L (ref 3.5–5.1)
Sodium: 138 mmol/L (ref 135–145)

## 2024-04-28 LAB — CBC
HCT: 30.6 % — ABNORMAL LOW (ref 39.0–52.0)
Hemoglobin: 9.7 g/dL — ABNORMAL LOW (ref 13.0–17.0)
MCH: 31.8 pg (ref 26.0–34.0)
MCHC: 31.7 g/dL (ref 30.0–36.0)
MCV: 100.3 fL — ABNORMAL HIGH (ref 80.0–100.0)
Platelets: 168 K/uL (ref 150–400)
RBC: 3.05 MIL/uL — ABNORMAL LOW (ref 4.22–5.81)
RDW: 13.5 % (ref 11.5–15.5)
WBC: 10.1 K/uL (ref 4.0–10.5)
nRBC: 0 % (ref 0.0–0.2)

## 2024-04-28 MED ORDER — METOPROLOL TARTRATE 12.5 MG HALF TABLET
12.5000 mg | ORAL_TABLET | Freq: Two times a day (BID) | ORAL | Status: DC
  Administered 2024-04-28 (×2): 12.5 mg via ORAL
  Filled 2024-04-28 (×2): qty 1

## 2024-04-28 MED ORDER — POTASSIUM CHLORIDE CRYS ER 20 MEQ PO TBCR
20.0000 meq | EXTENDED_RELEASE_TABLET | Freq: Every day | ORAL | Status: DC
  Administered 2024-04-28 – 2024-04-29 (×2): 20 meq via ORAL
  Filled 2024-04-28 (×2): qty 1

## 2024-04-28 MED ORDER — TORSEMIDE 20 MG PO TABS
40.0000 mg | ORAL_TABLET | Freq: Every day | ORAL | Status: AC
  Administered 2024-04-28 – 2024-04-29 (×2): 40 mg via ORAL
  Filled 2024-04-28 (×2): qty 2

## 2024-04-28 MED ORDER — ENSURE PLUS HIGH PROTEIN PO LIQD
237.0000 mL | Freq: Two times a day (BID) | ORAL | Status: DC
  Administered 2024-04-28 – 2024-05-01 (×5): 237 mL via ORAL

## 2024-04-28 MED ORDER — INSULIN ASPART 100 UNIT/ML IJ SOLN
0.0000 [IU] | Freq: Three times a day (TID) | INTRAMUSCULAR | Status: DC
  Administered 2024-04-28: 2 [IU] via SUBCUTANEOUS
  Administered 2024-04-29: 4 [IU] via SUBCUTANEOUS
  Administered 2024-04-29 – 2024-04-30 (×4): 2 [IU] via SUBCUTANEOUS
  Filled 2024-04-28 (×7): qty 2

## 2024-04-28 NOTE — Progress Notes (Signed)
 2 Days Post-Op Procedure(s) (LRB): CORONARY ARTERY BYPASS GRAFTING (CABG) TIMES 4 USING LEFT INTERNAL MAMMARY ARTERY AND ENDOSCOPICALLY HARVESTED RIGHT GREATER SAPHENOUS VEIN (N/A) REPLACEMENT, AORTIC VALVE, OPEN (N/A) ECHOCARDIOGRAM, TRANSESOPHAGEAL, INTRAOPERATIVE (N/A) Subjective: No complaints. Slept some. Walked a short loop slowly this am. Pain under control  Objective: Vital signs in last 24 hours: Temp:  [97.8 F (36.6 C)-99.7 F (37.6 C)] 98 F (36.7 C) (11/26 0323) Pulse Rate:  [68-87] 79 (11/26 0615) Cardiac Rhythm: Normal sinus rhythm (11/25 2014) Resp:  [10-30] 13 (11/26 0615) BP: (94-141)/(51-83) 136/61 (11/26 0615) SpO2:  [90 %-100 %] 96 % (11/26 0615) Arterial Line BP: (127-134)/(46-53) 130/52 (11/25 0815) Weight:  [113.5 kg] 113.5 kg (11/26 0500)  Hemodynamic parameters for last 24 hours: PAP: (38-44)/(14-20) 44/20 CVP:  [10 mmHg-18 mmHg] 18 mmHg CO:  [5 L/min] 5 L/min CI:  [2.25 L/min/m2] 2.25 L/min/m2  Intake/Output from previous day: 11/25 0701 - 11/26 0700 In: 383.2 [I.V.:183; IV Piggyback:200.1] Out: 855 [Urine:835; Chest Tube:20] Intake/Output this shift: No intake/output data recorded.  General appearance: alert and cooperative Neurologic: intact Heart: regular rate and rhythm and systolic AS murmur Lungs: diminished breath sounds bibasilar Extremities: edema moderate Wound: incisions ok  Lab Results: Recent Labs    04/27/24 1612 04/28/24 0256  WBC 11.5* 10.1  HGB 10.1* 9.7*  HCT 30.9* 30.6*  PLT 184 168   BMET:  Recent Labs    04/27/24 1612 04/28/24 0256  NA 135 138  K 4.2 4.2  CL 105 107  CO2 19* 19*  GLUCOSE 148* 125*  BUN 25* 29*  CREATININE 1.53* 1.62*  CALCIUM  8.2* 8.2*    PT/INR:  Recent Labs    04/26/24 1341  LABPROT 17.5*  INR 1.4*   ABG    Component Value Date/Time   PHART 7.327 (L) 04/26/2024 1821   HCO3 20.9 04/26/2024 1821   TCO2 22 04/26/2024 1821   ACIDBASEDEF 5.0 (H) 04/26/2024 1821   O2SAT 97  04/26/2024 1821   CBG (last 3)  Recent Labs    04/27/24 2312 04/28/24 0321 04/28/24 0657  GLUCAP 137* 123* 107*   CXR: bilateral LL atelectasis  Assessment/Plan: S/P Procedure(s) (LRB): CORONARY ARTERY BYPASS GRAFTING (CABG) TIMES 4 USING LEFT INTERNAL MAMMARY ARTERY AND ENDOSCOPICALLY HARVESTED RIGHT GREATER SAPHENOUS VEIN (N/A) REPLACEMENT, AORTIC VALVE, OPEN (N/A) ECHOCARDIOGRAM, TRANSESOPHAGEAL, INTRAOPERATIVE (N/A)  POD 2 Hemodynamically stable in sinus rhythm. Will resume metoprolol . Keep pacing wires in today.  -500 cc yesterday. Wt 250. He says he has been 240-245 at home but was fluid overloaded when he presented for surgery with edema in legs, high filling pressures. Will start Demedex 40 mg daily. Creat bumped to 1.62 from his baseline about 1.3. Repeat BMET in am.  Glucose under good control on SSI and SEMGLEE . Preop Hgb A1c 5.5 on no meds.  Continue IS, ambulation.  Severe AS with calcified aorta : plan TAVR once recovered from surgery.     LOS: 2 days    Dorise MARLA Fellers 04/28/2024

## 2024-04-28 NOTE — Plan of Care (Signed)

## 2024-04-28 NOTE — Progress Notes (Signed)
 TCTS Evening Rounds:  Hemodynamically stable in NSR.  Diuresing well today. -1L so far.  Ambulated and sitting up in chair.  BMET in am.

## 2024-04-28 NOTE — Progress Notes (Signed)
 04/28/2024 Looks great Walked unit this am No BM or flatus Cleared for dysphagia 3 with GERD precautions given some gastric motility issues Ongoing sign edema Abd protuberant but states this is normal for him Imaging ongoing volume loss on R Pulling ~750 on IS Labs reviewed Could consider diuresis, hopefully foley can come out and he is nearing readiness to transfer out. Will follow while in ICU  Rolan Sharps MD PCCM

## 2024-04-29 LAB — BASIC METABOLIC PANEL WITH GFR
Anion gap: 9 (ref 5–15)
BUN: 34 mg/dL — ABNORMAL HIGH (ref 8–23)
CO2: 23 mmol/L (ref 22–32)
Calcium: 8 mg/dL — ABNORMAL LOW (ref 8.9–10.3)
Chloride: 105 mmol/L (ref 98–111)
Creatinine, Ser: 1.51 mg/dL — ABNORMAL HIGH (ref 0.61–1.24)
GFR, Estimated: 47 mL/min — ABNORMAL LOW (ref >60)
Glucose, Bld: 116 mg/dL — ABNORMAL HIGH (ref 70–99)
Potassium: 3.6 mmol/L (ref 3.5–5.1)
Sodium: 137 mmol/L (ref 135–145)

## 2024-04-29 LAB — GLUCOSE, CAPILLARY
Glucose-Capillary: 105 mg/dL — ABNORMAL HIGH (ref 70–99)
Glucose-Capillary: 180 mg/dL — ABNORMAL HIGH (ref 70–99)
Glucose-Capillary: 127 mg/dL — ABNORMAL HIGH (ref 70–99)
Glucose-Capillary: 130 mg/dL — ABNORMAL HIGH (ref 70–99)

## 2024-04-29 MED ORDER — AMLODIPINE BESYLATE 10 MG PO TABS
10.0000 mg | ORAL_TABLET | Freq: Every day | ORAL | Status: DC
  Administered 2024-04-29: 10 mg via ORAL
  Filled 2024-04-29: qty 1

## 2024-04-29 MED ORDER — AMIODARONE HCL 200 MG PO TABS
400.0000 mg | ORAL_TABLET | Freq: Two times a day (BID) | ORAL | Status: DC
  Administered 2024-04-29 (×2): 400 mg via ORAL
  Filled 2024-04-29 (×2): qty 2

## 2024-04-29 MED ORDER — TAMSULOSIN HCL 0.4 MG PO CAPS
0.4000 mg | ORAL_CAPSULE | Freq: Every day | ORAL | Status: DC
  Administered 2024-04-29 – 2024-05-02 (×4): 0.4 mg via ORAL
  Filled 2024-04-29 (×4): qty 1

## 2024-04-29 MED ORDER — POTASSIUM CHLORIDE CRYS ER 20 MEQ PO TBCR
20.0000 meq | EXTENDED_RELEASE_TABLET | ORAL | Status: AC
  Administered 2024-04-29 (×3): 20 meq via ORAL
  Filled 2024-04-29 (×3): qty 1

## 2024-04-29 MED ORDER — LACTULOSE 10 GM/15ML PO SOLN
30.0000 g | Freq: Once | ORAL | Status: AC
  Administered 2024-04-29: 30 g via ORAL
  Filled 2024-04-29: qty 45

## 2024-04-29 MED ORDER — AMIODARONE IV BOLUS ONLY 150 MG/100ML
150.0000 mg | Freq: Once | INTRAVENOUS | Status: AC
  Administered 2024-04-29: 150 mg via INTRAVENOUS
  Filled 2024-04-29: qty 100

## 2024-04-29 MED ORDER — METOPROLOL TARTRATE 25 MG PO TABS: 25.0000 mg | ORAL_TABLET | Freq: Two times a day (BID) | ORAL | Status: DC

## 2024-04-29 NOTE — Progress Notes (Signed)
 04/29/2024 Discussed case with TCTS, watching for some brady episodes overnight and continuing diuresis.  Check AM CXR.  No other issues.  Rolan Sharps MD PCCM

## 2024-04-29 NOTE — Progress Notes (Addendum)
 3 Days Post-Op Procedure(s) (LRB): CORONARY ARTERY BYPASS GRAFTING (CABG) TIMES 4 USING LEFT INTERNAL MAMMARY ARTERY AND ENDOSCOPICALLY HARVESTED RIGHT GREATER SAPHENOUS VEIN (N/A) REPLACEMENT, AORTIC VALVE, OPEN (N/A) ECHOCARDIOGRAM, TRANSESOPHAGEAL, INTRAOPERATIVE (N/A) Subjective: Several episodes of bradycardia overnight to the 30s while sleeping Currently sinus in the 90s Telemetry shows at least 3 episodes of brady, wires reattached and put to back up Otherwise feels well, has not yet walked this morning No BM since surgery Sats stable on 1L [place]  Objective: Vital signs in last 24 hours: Temp:  [98.1 F (36.7 C)-98.7 F (37.1 C)] 98.3 F (36.8 C) (11/27 0624) Pulse Rate:  [65-89] 86 (11/27 0700) Cardiac Rhythm: Normal sinus rhythm;Sinus bradycardia (11/27 0400) Resp:  [10-26] 19 (11/27 0700) BP: (88-156)/(54-101) 152/81 (11/27 0700) SpO2:  [87 %-99 %] 99 % (11/27 0700) Weight:  [112.4 kg] 112.4 kg (11/27 0500)  Hemodynamic parameters for last 24 hours:    Intake/Output from previous day: 11/26 0701 - 11/27 0700 In: 10 [I.V.:10] Out: 2110 [Urine:2110] Intake/Output this shift: No intake/output data recorded.  General appearance: alert and cooperative Neurologic: intact Heart: regular rate and rhythm and systolic AS murmur Lungs: diminished breath sounds bibasilar Extremities: edema moderate Wound: incisions ok  Lab Results: Recent Labs    04/27/24 1612 04/28/24 0256  WBC 11.5* 10.1  HGB 10.1* 9.7*  HCT 30.9* 30.6*  PLT 184 168   BMET:  Recent Labs    04/28/24 0256 04/29/24 0327  NA 138 137  K 4.2 3.6  CL 107 105  CO2 19* 23  GLUCOSE 125* 116*  BUN 29* 34*  CREATININE 1.62* 1.51*  CALCIUM  8.2* 8.0*    PT/INR:  Recent Labs    04/26/24 1341  LABPROT 17.5*  INR 1.4*   ABG    Component Value Date/Time   PHART 7.327 (L) 04/26/2024 1821   HCO3 20.9 04/26/2024 1821   TCO2 22 04/26/2024 1821   ACIDBASEDEF 5.0 (H) 04/26/2024 1821   O2SAT 97  04/26/2024 1821   CBG (last 3)  Recent Labs    04/28/24 1611 04/28/24 2113 04/29/24 0624  GLUCAP 123* 93 105*   CXR: No new imaging  Assessment/Plan: S/P Procedure(s) (LRB): CORONARY ARTERY BYPASS GRAFTING (CABG) TIMES 4 USING LEFT INTERNAL MAMMARY ARTERY AND ENDOSCOPICALLY HARVESTED RIGHT GREATER SAPHENOUS VEIN (N/A) REPLACEMENT, AORTIC VALVE, OPEN (N/A) ECHOCARDIOGRAM, TRANSESOPHAGEAL, INTRAOPERATIVE (N/A)  POD 3 Hemodynamically stable in 90s, but several episodes of HR in the 30s overnight while sleeping that self-resolved.  Will hold metoprolol  for now and keep wires on AAI back up. - Resume home norvasc  for BP control  GI - no BM since surgery. No nausea, tolerating diet. Will give lactulose  x 1 today.  Renal -Cre improving, will continue torsemide  for diuresis  Reports a weak stream and difficulty starting urination at home. Will start flomax  today and plan to remove foley tomorrow  Glucose under good control on SSI and SEMGLEE . Continue current regimen  Continue IS, ambulation.  Severe AS with calcified aorta : plan TAVR once recovered from surgery.  Continue ICU care given new bradycardia.   LOS: 3 days    Con RAMAN Todd Spears 04/29/2024

## 2024-04-29 NOTE — Plan of Care (Signed)

## 2024-04-29 NOTE — Progress Notes (Signed)
 TCTS PM Rounding Progress Note  Looks great this evening Had multiple bowel movements In sinus rhythm 80s, no additional bradycardia episodes  Vitals:   04/29/24 1700 04/29/24 1800  BP: 110/68 107/60  Pulse: 89 86  Resp: 16 16  Temp:    SpO2: 97% 96%    Plan: Continue current plan, keep wires at VVI 50 backup  Con Clunes, MD Cardiothoracic Surgery Pager: [phone]

## 2024-04-30 ENCOUNTER — Inpatient Hospital Stay (HOSPITAL_COMMUNITY)

## 2024-04-30 DIAGNOSIS — Z48812 Encounter for surgical aftercare following surgery on the circulatory system: Secondary | ICD-10-CM | POA: Diagnosis not present

## 2024-04-30 DIAGNOSIS — I517 Cardiomegaly: Secondary | ICD-10-CM | POA: Diagnosis not present

## 2024-04-30 DIAGNOSIS — R918 Other nonspecific abnormal finding of lung field: Secondary | ICD-10-CM | POA: Diagnosis not present

## 2024-04-30 DIAGNOSIS — J90 Pleural effusion, not elsewhere classified: Secondary | ICD-10-CM | POA: Diagnosis not present

## 2024-04-30 LAB — GLUCOSE, CAPILLARY
Glucose-Capillary: 112 mg/dL — ABNORMAL HIGH (ref 70–99)
Glucose-Capillary: 107 mg/dL — ABNORMAL HIGH (ref 70–99)
Glucose-Capillary: 134 mg/dL — ABNORMAL HIGH (ref 70–99)
Glucose-Capillary: 147 mg/dL — ABNORMAL HIGH (ref 70–99)
Glucose-Capillary: 101 mg/dL — ABNORMAL HIGH (ref 70–99)

## 2024-04-30 LAB — BASIC METABOLIC PANEL WITH GFR
Anion gap: 9 (ref 5–15)
BUN: 37 mg/dL — ABNORMAL HIGH (ref 8–23)
CO2: 23 mmol/L (ref 22–32)
Calcium: 8.2 mg/dL — ABNORMAL LOW (ref 8.9–10.3)
Chloride: 107 mmol/L (ref 98–111)
Creatinine, Ser: 1.54 mg/dL — ABNORMAL HIGH (ref 0.61–1.24)
GFR, Estimated: 46 mL/min — ABNORMAL LOW (ref >60)
Glucose, Bld: 102 mg/dL — ABNORMAL HIGH (ref 70–99)
Potassium: 3.7 mmol/L (ref 3.5–5.1)
Sodium: 139 mmol/L (ref 135–145)

## 2024-04-30 MED ORDER — SODIUM CHLORIDE 0.9 % IV SOLN: 250.0000 mL | INTRAVENOUS | Status: AC | PRN

## 2024-04-30 MED ORDER — SODIUM CHLORIDE 0.9% FLUSH
3.0000 mL | Freq: Two times a day (BID) | INTRAVENOUS | Status: DC
  Administered 2024-04-30 – 2024-05-02 (×5): 3 mL via INTRAVENOUS

## 2024-04-30 MED ORDER — AMIODARONE HCL 200 MG PO TABS
200.0000 mg | ORAL_TABLET | Freq: Two times a day (BID) | ORAL | Status: DC
  Administered 2024-04-30: 200 mg via ORAL
  Filled 2024-04-30: qty 1

## 2024-04-30 MED ORDER — ~~LOC~~ CARDIAC SURGERY, PATIENT & FAMILY EDUCATION: Freq: Once | Status: AC

## 2024-04-30 MED ORDER — AMLODIPINE BESYLATE 5 MG PO TABS
5.0000 mg | ORAL_TABLET | Freq: Every day | ORAL | Status: DC
  Administered 2024-04-30: 5 mg via ORAL
  Filled 2024-04-30: qty 1

## 2024-04-30 MED ORDER — SODIUM CHLORIDE 0.9% FLUSH: 3.0000 mL | INTRAVENOUS | Status: DC | PRN

## 2024-04-30 MED ORDER — POTASSIUM CHLORIDE CRYS ER 20 MEQ PO TBCR
20.0000 meq | EXTENDED_RELEASE_TABLET | ORAL | Status: AC
  Administered 2024-04-30 (×3): 20 meq via ORAL
  Filled 2024-04-30 (×3): qty 1

## 2024-04-30 MED ORDER — TORSEMIDE 20 MG PO TABS
20.0000 mg | ORAL_TABLET | Freq: Every day | ORAL | Status: DC
  Administered 2024-04-30 – 2024-05-02 (×3): 20 mg via ORAL
  Filled 2024-04-30 (×3): qty 1

## 2024-04-30 MED ORDER — AMIODARONE HCL 200 MG PO TABS
400.0000 mg | ORAL_TABLET | Freq: Two times a day (BID) | ORAL | Status: DC
  Administered 2024-04-30 – 2024-05-01 (×3): 400 mg via ORAL
  Filled 2024-04-30 (×3): qty 2

## 2024-04-30 MED ORDER — ASPIRIN 325 MG PO TBEC
325.0000 mg | DELAYED_RELEASE_TABLET | Freq: Every day | ORAL | Status: DC
  Administered 2024-05-01 – 2024-05-02 (×2): 325 mg via ORAL
  Filled 2024-04-30 (×2): qty 1

## 2024-04-30 MED ORDER — POTASSIUM CHLORIDE CRYS ER 20 MEQ PO TBCR
20.0000 meq | EXTENDED_RELEASE_TABLET | Freq: Two times a day (BID) | ORAL | Status: DC
Start: 2024-04-30 — End: 2024-04-30

## 2024-04-30 MED ORDER — POTASSIUM CHLORIDE CRYS ER 20 MEQ PO TBCR
20.0000 meq | EXTENDED_RELEASE_TABLET | Freq: Two times a day (BID) | ORAL | Status: DC
  Administered 2024-05-01 – 2024-05-02 (×3): 20 meq via ORAL
  Filled 2024-04-30 (×3): qty 1

## 2024-04-30 NOTE — Plan of Care (Signed)

## 2024-04-30 NOTE — Progress Notes (Signed)
 Speech Language Pathology Treatment: Dysphagia  Patient Details Name: Todd Spears MRN: [id number] DOB: [birth year] Today's Date: 04/30/2024 Time: 8877-8869 SLP Time Calculation (min) (ACUTE ONLY): 8 min  Assessment / Plan / Recommendation Clinical Impression  Pt sitting upright in the chair, feeding himself from his lunch tray. No s/s of dysphagia or aspiration were observed. Education was provided re: esophageal precautions and ongoing f/u is not needed. Pt prefers to remain on Dys 3 solids but can be advanced to regular by RN per pt preference.    HPI HPI: Patient is a 78 y.o. male who presented to [facility] on 04/26/24 for CABGx4, replacement of aortic valve secondary to diagnosis of CAD. He was intubated for surgery and extubated the same day. He failed the Yale swallow screen with RN on 11/24 and was kept NPO. SLP BSE on 11/25 revealed clinical s/s dysphagia. MBS ordered to further assess swallow, showing no penetration/aspiration but retrograde bolus flow below the level of the PES. PMH: HTN, HLD, CKD, CAD, GERD, OA.      SLP Plan  All goals met          Recommendations  Diet recommendations: Dysphagia 3 (mechanical soft);Thin liquid Liquids provided via: Cup;Straw Medication Administration: Whole meds with liquid Supervision: Patient able to self feed;Intermittent supervision to cue for compensatory strategies Compensations: Slow rate;Small sips/bites Postural Changes and/or Swallow Maneuvers: Seated upright 90 degrees;Upright 30-60 min after meal                  Oral care BID   PRN Dysphagia, oral phase (R13.11);Dysphagia, pharyngoesophageal phase (R13.14)     All goals met     Damien Blumenthal, M.A., CCC-SLP Speech Language Pathology, Acute Rehabilitation Services  Secure Chat preferred [phone]   04/30/2024, 11:57 AM

## 2024-04-30 NOTE — Progress Notes (Signed)
 Pt arrived from ...2H.., A/ox .4.Marland Kitchenpt denies any pain, MD aware,CCMD called. CHG bath given,no further needs at this time

## 2024-04-30 NOTE — Progress Notes (Signed)
 04/30/2024 Continues diuresis, to floor today, PCCM will be available as needed.  Rolan Sharps MD PCCM

## 2024-04-30 NOTE — Progress Notes (Signed)
 CARDIAC REHAB PHASE I   Post OHS education including site care, restrictions, heart healthy diabetic diet, sternal precautions, IS use, exercise guidelines, and CRP2 reviewed. All questions and concerns addressed. Will refer to [facility] for CRP2.   Fairy JONETTA Music, RN BSN 04/30/2024 4:45 PM [phone]

## 2024-04-30 NOTE — Progress Notes (Signed)
 4 Days Post-Op Procedure(s) (LRB): CORONARY ARTERY BYPASS GRAFTING (CABG) TIMES 4 USING LEFT INTERNAL MAMMARY ARTERY AND ENDOSCOPICALLY HARVESTED RIGHT GREATER SAPHENOUS VEIN (N/A) REPLACEMENT, AORTIC VALVE, OPEN (N/A) ECHOCARDIOGRAM, TRANSESOPHAGEAL, INTRAOPERATIVE (N/A) Subjective: No complaints. Walked this am.  Reportedly paced a little per nurse. Was on VVI 50.  Objective: Vital signs in last 24 hours: Temp:  [97.9 F (36.6 C)-98.8 F (37.1 C)] 98.2 F (36.8 C) (11/28 0400) Pulse Rate:  [69-97] 73 (11/28 0400) Cardiac Rhythm: Heart block (11/28 0400) Resp:  [11-27] 14 (11/28 0400) BP: (89-156)/(44-118) 114/64 (11/28 0400) SpO2:  [94 %-100 %] 100 % (11/28 0400) Weight:  [110.6 kg] 110.6 kg (11/28 0500)  Hemodynamic parameters for last 24 hours:    Intake/Output from previous day: 11/27 0701 - 11/28 0700 In: 203 [P.O.:100; I.V.:103] Out: 2375 [Urine:2375] Intake/Output this shift: No intake/output data recorded.  General appearance: alert and cooperative Neurologic: intact Heart: regular rate and rhythm and systolic murmur Lungs: clear to auscultation bilaterally Extremities: moderate pitting edema that is improving Wound: incision healing well  Lab Results: Recent Labs    04/27/24 1612 04/28/24 0256  WBC 11.5* 10.1  HGB 10.1* 9.7*  HCT 30.9* 30.6*  PLT 184 168   BMET:  Recent Labs    04/29/24 0327 04/30/24 0256  NA 137 139  K 3.6 3.7  CL 105 107  CO2 23 23  GLUCOSE 116* 102*  BUN 34* 37*  CREATININE 1.51* 1.54*  CALCIUM  8.0* 8.2*    PT/INR: No results for input(s): LABPROT, INR in the last 72 hours. ABG    Component Value Date/Time   PHART 7.327 (L) 04/26/2024 1821   HCO3 20.9 04/26/2024 1821   TCO2 22 04/26/2024 1821   ACIDBASEDEF 5.0 (H) 04/26/2024 1821   O2SAT 97 04/26/2024 1821   CBG (last 3)  Recent Labs    04/29/24 1521 04/29/24 2122 04/30/24 0643  GLUCAP 130* 112* 107*   CXR: improving bibasilar atelectasis and  effusions  ECG: pending this am.  Assessment/Plan: S/P Procedure(s) (LRB): CORONARY ARTERY BYPASS GRAFTING (CABG) TIMES 4 USING LEFT INTERNAL MAMMARY ARTERY AND ENDOSCOPICALLY HARVESTED RIGHT GREATER SAPHENOUS VEIN (N/A) REPLACEMENT, AORTIC VALVE, OPEN (N/A) ECHOCARDIOGRAM, TRANSESOPHAGEAL, INTRAOPERATIVE (N/A)  POD 4  Hemodynamically stable in NSR. He had some postop atrial fib yesterday converted with amio. Switched to po. Will decrease to 200 bid. Give boluses as needed for recurrent AF. Not using beta blocker due to bradycardia. Continue Norvasc  5. Want to avoid hypotension with severe AS.  -2172 cc yesterday. Wt is down 4 lbs from yesterday and 10 lbs below preop although he was very overloaded at the time of surgery with marked pitting edema bilaterally. Will continue daily Demedex 20 to try to keep negative with decreasing wt and edema. His creat is 1.54 and baseline is 1.3 so will need to keep an eye on this.  DC foley.  Glucose under good control on current regimen.  Transfer to 4E and continue mobilization and IS.    LOS: 4 days    Todd Spears 04/30/2024

## 2024-05-01 ENCOUNTER — Inpatient Hospital Stay (HOSPITAL_COMMUNITY)

## 2024-05-01 LAB — GLUCOSE, CAPILLARY
Glucose-Capillary: 113 mg/dL — ABNORMAL HIGH (ref 70–99)
Glucose-Capillary: 159 mg/dL — ABNORMAL HIGH (ref 70–99)
Glucose-Capillary: 131 mg/dL — ABNORMAL HIGH (ref 70–99)
Glucose-Capillary: 128 mg/dL — ABNORMAL HIGH (ref 70–99)

## 2024-05-01 LAB — BASIC METABOLIC PANEL WITH GFR
Anion gap: 7 (ref 5–15)
BUN: 33 mg/dL — ABNORMAL HIGH (ref 8–23)
CO2: 25 mmol/L (ref 22–32)
Calcium: 8.3 mg/dL — ABNORMAL LOW (ref 8.9–10.3)
Chloride: 103 mmol/L (ref 98–111)
Creatinine, Ser: 1.51 mg/dL — ABNORMAL HIGH (ref 0.61–1.24)
GFR, Estimated: 47 mL/min — ABNORMAL LOW (ref >60)
Glucose, Bld: 105 mg/dL — ABNORMAL HIGH (ref 70–99)
Potassium: 3.7 mmol/L (ref 3.5–5.1)
Sodium: 135 mmol/L (ref 135–145)

## 2024-05-01 MED ORDER — AMLODIPINE BESYLATE 5 MG PO TABS
5.0000 mg | ORAL_TABLET | Freq: Every day | ORAL | Status: DC
  Administered 2024-05-01 – 2024-05-02 (×2): 5 mg via ORAL
  Filled 2024-05-01 (×2): qty 1

## 2024-05-01 MED ORDER — AMLODIPINE BESYLATE 10 MG PO TABS: 10.0000 mg | ORAL_TABLET | Freq: Every day | ORAL | Status: DC

## 2024-05-01 MED ORDER — AMIODARONE HCL 200 MG PO TABS
200.0000 mg | ORAL_TABLET | Freq: Two times a day (BID) | ORAL | Status: DC
  Administered 2024-05-01 – 2024-05-02 (×2): 200 mg via ORAL
  Filled 2024-05-01 (×2): qty 1

## 2024-05-01 NOTE — Progress Notes (Signed)
 Pacing wires removed per order. Vitals taken

## 2024-05-01 NOTE — Progress Notes (Addendum)
      [address] [employer] [address]             [phone]         5 Days Post-Op Procedure(s) (LRB): CORONARY ARTERY BYPASS GRAFTING (CABG) TIMES 4 USING LEFT INTERNAL MAMMARY ARTERY AND ENDOSCOPICALLY HARVESTED RIGHT GREATER SAPHENOUS VEIN (N/A) REPLACEMENT, AORTIC VALVE, OPEN (N/A) ECHOCARDIOGRAM, TRANSESOPHAGEAL, INTRAOPERATIVE (N/A)  Subjective:  Patient sitting up in chair w/o complaints.  Objective: Vital signs in last 24 hours: Temp:  [98.3 F (36.8 C)-98.4 F (36.9 C)] 98.3 F (36.8 C) (11/29 0345) Pulse Rate:  [75-91] 75 (11/29 0345) Cardiac Rhythm: Normal sinus rhythm;Heart block (11/29 0708) Resp:  [14-21] 17 (11/29 0345) BP: (101-141)/(54-83) 124/54 (11/29 0345) SpO2:  [95 %-100 %] 98 % (11/29 0345) Weight:  [110 kg] 110 kg (11/29 0500)  Intake/Output from previous day: 11/28 0701 - 11/29 0700 In: -  Out: 1925 [Urine:1925]  General appearance: alert, cooperative, no distress, and morbidly obese Heart: regular rate and rhythm Lungs: clear to auscultation bilaterally Abdomen: soft, non-tender; bowel sounds normal; no masses,  no organomegaly Extremities: edema + pitting  Wound: clean and dry  Lab Results: No results for input(s): WBC, HGB, HCT, PLT in the last 72 hours. BMET:  Recent Labs    04/30/24 0256 05/01/24 0311  NA 139 135  K 3.7 3.7  CL 107 103  CO2 23 25  GLUCOSE 102* 105*  BUN 37* 33*  CREATININE 1.54* 1.51*  CALCIUM  8.2* 8.3*    PT/INR: No results for input(s): LABPROT, INR in the last 72 hours. ABG    Component Value Date/Time   PHART 7.327 (L) 04/26/2024 1821   HCO3 20.9 04/26/2024 1821   TCO2 22 04/26/2024 1821   ACIDBASEDEF 5.0 (H) 04/26/2024 1821   O2SAT 97 04/26/2024 1821   CBG (last 3)  Recent Labs    04/30/24 1714 04/30/24 2102 05/01/24 0557  GLUCAP 147* 101* 113*    Assessment/Plan: S/P Procedure(s) (LRB): CORONARY ARTERY BYPASS GRAFTING (CABG) TIMES 4 USING LEFT  INTERNAL MAMMARY ARTERY AND ENDOSCOPICALLY HARVESTED RIGHT GREATER SAPHENOUS VEIN (N/A) REPLACEMENT, AORTIC VALVE, OPEN (N/A) ECHOCARDIOGRAM, TRANSESOPHAGEAL, INTRAOPERATIVE (N/A)  CV- PAF, currently in NSR with 1st degree AV Block.. no BB due to rhythm issues/bradycardia- remains on low dose Amiodarone .. on Norvasc  for BP control.. however this can run slightly higher in setting of AS... ? Remove EPW today? Pulm- continue IS, patient not requiring oxygen as this time Renal- + pitting edema in setting of Aortic Stenosis.SABRA continue Demadex  to help volume management DM- sugars remain controlled    LOS: 5 days    Rocky Shad, [name] 05/01/2024 8:41 AM  Looks good this morning.  Had a 45 min run of Afib around 8:30 that self-converted.  Patient asymptomatic.  No issues with bradycardia so will remove pacing wires today.  Amio down to 200, no beta blockers.  Has pitting edema but he says it's at his baseline for the last 2 months.  Breathing well on room air and has ambulated multiple times in the hall with rolling walked.  Will have PT eval for dispo to ensure safe to go home with wife and son.  Probably home tomorrow.  Con Clunes, MD Cardiothoracic Surgery Pager: [phone]

## 2024-05-01 NOTE — Evaluation (Signed)
 Physical Therapy Evaluation Patient Details Name: CRISTOFER YAFFE MRN: [id number] DOB: 1945-10-11 Today's Date: 05/01/2024  History of Present Illness  Pt is a 78 y/o male admitted 11/24 s/p CABG x4, but Aortic valve deferred to a near future period of time.  PMH HTN, HLD, CKD, CAD, GERD, OA, gout,  Clinical Impression  Pt admitted with/for CABG x4.  Pt still needing CGA to min assist.  Education reinforced and generally completed to families' satisfaction.  Pt currently limited functionally due to the problems listed below.  (see problems list.)  Pt will benefit from PT to maximize function and safety to be able to get home safely with available assist.         If plan is discharge home, recommend the following: A little help with walking and/or transfers;A little help with bathing/dressing/bathroom;Assistance with cooking/housework;Assist for transportation   Can travel by private vehicle        Equipment Recommendations None recommended by PT  Recommendations for Other Services       Functional Status Assessment Patient has had a recent decline in their functional status and demonstrates the ability to make significant improvements in function in a reasonable and predictable amount of time.     Precautions / Restrictions Precautions Precautions: Fall      Mobility  Bed Mobility Overal bed mobility: Needs Assistance Bed Mobility: Sidelying to Sit, Sit to Sidelying   Sidelying to sit: Min assist, Contact guard assist     Sit to sidelying: Contact guard assist General bed mobility comments: demonstration cues and pt  return demo'd at min assist, but improved to CGA    Transfers Overall transfer level: Needs assistance   Transfers: Sit to/from Stand Sit to Stand: Contact guard assist                Ambulation/Gait Ambulation/Gait assistance: Contact guard assist Gait Distance (Feet): 400 Feet Assistive device: Rolling walker (2 wheels) Gait  Pattern/deviations: Step-through pattern   Gait velocity interpretation: 1.31 - 2.62 ft/sec, indicative of limited community ambulator   General Gait Details: generally steady, but weak enough for appropriate use of the RW  Stairs Stairs: Yes   Stair Management: One rail Left, Step to pattern, Forwards, Backwards Number of Stairs: 1 (x3  has curb)    Wheelchair Mobility     Tilt Bed    Modified Rankin (Stroke Patients Only)       Balance                                             Pertinent Vitals/Pain Pain Assessment Pain Assessment: Faces Faces Pain Scale: Hurts little more Pain Location: sternal Pain Descriptors / Indicators: Burning, Sore Pain Intervention(s): Limited activity within patient's tolerance    Home Living Family/patient expects to be discharged to:: Private residence Living Arrangements: Spouse/significant other Available Help at Discharge: Family;Available 24 hours/day Type of Home: Apartment (home is being renovated) Home Access: Stairs to enter   Secretary/administrator of Steps: 1   Home Layout: One level Home Equipment: None      Prior Function Prior Level of Function : Independent/Modified Independent;Working/employed;Driving                     Extremity/Trunk Assessment   Upper Extremity Assessment Upper Extremity Assessment: Overall WFL for tasks assessed    Lower Extremity Assessment Lower Extremity Assessment:  Overall [facility] for tasks assessed    Cervical / Trunk Assessment Cervical / Trunk Assessment: Normal  Communication   Communication Communication: No apparent difficulties    Cognition Arousal: Alert Behavior During Therapy: WFL for tasks assessed/performed   PT - Cognitive impairments: No apparent impairments                         Following commands: Intact       Cueing Cueing Techniques: Verbal cues, Visual cues     General Comments General comments (skin integrity,  edema, etc.): pt, wife and sone instructed, reinforced in all precautions and progression of activity, appropropriate/safe sequencing.    Exercises     Assessment/Plan    PT Assessment Patient needs continued PT services  PT Problem List Decreased activity tolerance;Decreased mobility;Decreased knowledge of use of DME;Decreased knowledge of precautions;Cardiopulmonary status limiting activity;Decreased strength       PT Treatment Interventions DME instruction;Gait training;Stair training;Functional mobility training;Therapeutic activities;Patient/family education    PT Goals (Current goals can be found in the Care Plan section)  Acute Rehab PT Goals Patient Stated Goal: Independent, back to work PT Goal Formulation: With patient Time For Goal Achievement: 05/08/24 Potential to Achieve Goals: Good    Frequency Min 3X/week     Co-evaluation               AM-PAC PT 6 Clicks Mobility  Outcome Measure Help needed turning from your back to your side while in a flat bed without using bedrails?: A Little Help needed moving from lying on your back to sitting on the side of a flat bed without using bedrails?: A Little Help needed moving to and from a bed to a chair (including a wheelchair)?: A Little Help needed standing up from a chair using your arms (e.g., wheelchair or bedside chair)?: A Little Help needed to walk in hospital room?: A Little Help needed climbing 3-5 steps with a railing? : A Little 6 Click Score: 18    End of Session   Activity Tolerance: Patient tolerated treatment well Patient left: in chair;with call bell/phone within reach;with family/visitor present Nurse Communication: Mobility status;Precautions PT Visit Diagnosis: Other abnormalities of gait and mobility (R26.89)    Time: 8575-8497 PT Time Calculation (min) (ACUTE ONLY): 38 min   Charges:   PT Evaluation $PT Eval Moderate Complexity: 1 Mod PT Treatments $Gait Training: 8-22 mins $Self  Care/Home Management: 8-22 PT General Charges $$ ACUTE PT VISIT: 1 Visit         05/01/2024  [place] HERO., PT Acute Rehabilitation Services [phone]  (office)  Vinie GAILS Kweli Grassel 05/01/2024, 7:34 PM

## 2024-05-01 NOTE — Plan of Care (Signed)

## 2024-05-02 LAB — GLUCOSE, CAPILLARY: Glucose-Capillary: 123 mg/dL — ABNORMAL HIGH (ref 70–99)

## 2024-05-02 MED ORDER — TAMSULOSIN HCL 0.4 MG PO CAPS: 0.4000 mg | ORAL_CAPSULE | Freq: Every day | ORAL | 3 refills | Status: AC

## 2024-05-02 MED ORDER — OXYCODONE HCL 5 MG PO TABS: 5.0000 mg | ORAL_TABLET | ORAL | 0 refills | Status: AC | PRN

## 2024-05-02 MED ORDER — AMIODARONE HCL 200 MG PO TABS: 200.0000 mg | ORAL_TABLET | Freq: Two times a day (BID) | ORAL | 1 refills | Status: DC

## 2024-05-02 MED ORDER — TORSEMIDE 20 MG PO TABS: 20.0000 mg | ORAL_TABLET | Freq: Every day | ORAL | 3 refills | Status: AC

## 2024-05-02 MED ORDER — AMLODIPINE BESYLATE 5 MG PO TABS: 5.0000 mg | ORAL_TABLET | Freq: Every day | ORAL | 1 refills | Status: AC

## 2024-05-02 NOTE — Evaluation (Signed)
 Occupational Therapy Evaluation Patient Details Name: LYNNE RIGHI MRN: [id number] DOB: 09/18/1945 Today's Date: 05/02/2024   History of Present Illness   Pt is a 78 y/o male admitted 11/24 s/p CABG x4, but Aortic valve deferred to a near future period of time.  PMH HTN, HLD, CKD, CAD, GERD, OA, gout,     Clinical Impressions Pt admitted based on above, and was seen based on problem list below. PTA pt was independent with ADLs and IADLs. Today pt is requiring s for safety with ADLs to ensure adherance to sternal precautions. Functional transfers are  s for safety. Educated pt on compensatory and energy conservation strategies for ADLs while adhering to sternal precautions. Pt able to return demo and verbalized understanding. OT will continue to follow acutely to reinforce precautions and increase activity tolerance, however no follow up OT needs.       If plan is discharge home, recommend the following:   A little help with walking and/or transfers;A little help with bathing/dressing/bathroom;Assistance with cooking/housework;Assist for transportation     Functional Status Assessment   Patient has had a recent decline in their functional status and demonstrates the ability to make significant improvements in function in a reasonable and predictable amount of time.     Equipment Recommendations   Tub/shower seat      Precautions/Restrictions   Precautions Precautions: Fall;Sternal Precaution Booklet Issued: Yes (comment) (has HF booklet) Recall of Precautions/Restrictions: Intact Restrictions Weight Bearing Restrictions Per Provider Order: No Other Position/Activity Restrictions: cardiac sternal precautions     Mobility Bed Mobility Overal bed mobility: Needs Assistance Bed Mobility: Sidelying to Sit, Sit to Sidelying   Sidelying to sit: Min assist, Contact guard assist     Sit to sidelying: Contact guard assist General bed mobility comments: demonstration  cues and pt  return demo'd at min assist, but improved to CGA    Transfers Overall transfer level: Needs assistance   Transfers: Sit to/from Stand Sit to Stand: Supervision           General transfer comment: S for safety with good technique      Balance Overall balance assessment: Mild deficits observed, not formally tested         ADL either performed or assessed with clinical judgement   ADL Overall ADL's : Needs assistance/impaired Eating/Feeding: Set up;Sitting   Grooming: Oral care;Wash/dry face;Standing           Upper Body Dressing : Sitting;Supervision/safety   Lower Body Dressing: Supervision/safety;Sit to/from stand   Toilet Transfer: Supervision/safety;Ambulation   Toileting- Clothing Manipulation and Hygiene: Supervision/safety;Sit to/from stand       Functional mobility during ADLs: Supervision/safety General ADL Comments: S for safety with compensatory strategies for ADLs     Vision Baseline Vision/History: 0 No visual deficits Vision Assessment?: No apparent visual deficits            Pertinent Vitals/Pain Pain Assessment Pain Assessment: Faces Faces Pain Scale: Hurts little more Pain Location: sternal Pain Descriptors / Indicators: Burning, Sore Pain Intervention(s): Monitored during session     Extremity/Trunk Assessment Upper Extremity Assessment Upper Extremity Assessment: Overall WFL for tasks assessed   Lower Extremity Assessment Lower Extremity Assessment: Defer to PT evaluation   Cervical / Trunk Assessment Cervical / Trunk Assessment: Normal   Communication Communication Communication: No apparent difficulties   Cognition Arousal: Alert Behavior During Therapy: WFL for tasks assessed/performed Cognition: No apparent impairments     Following commands: Intact       Cueing  General Comments   Cueing Techniques: Verbal cues;Visual cues  Reviewed ADLs and energy conservation strategies with pt            Home Living Family/patient expects to be discharged to:: Private residence Living Arrangements: Spouse/significant other Available Help at Discharge: Family;Available 24 hours/day Type of Home: Apartment (home is being renovated) Home Access: Stairs to enter Secretary/administrator of Steps: 1   Home Layout: One level     Bathroom Shower/Tub: Chief Strategy Officer: Handicapped height     Home Equipment: Toilet riser;Adaptive equipment Adaptive Equipment: Sock aid        Prior Functioning/Environment Prior Level of Function : Independent/Modified Independent;Working/employed;Driving             Mobility Comments: No AD, denies falls ADLs Comments: mod I, uses sock aid for socks    OT Problem List: Decreased activity tolerance;Cardiopulmonary status limiting activity   OT Treatment/Interventions: Self-care/ADL training;Therapeutic exercise;Energy conservation;DME and/or AE instruction;Therapeutic activities;Patient/family education;Balance training      OT Goals(Current goals can be found in the care plan section)   Acute Rehab OT Goals Patient Stated Goal: To go home OT Goal Formulation: With patient Time For Goal Achievement: 05/16/24 Potential to Achieve Goals: Good   OT Frequency:  Min 2X/week       AM-PAC OT 6 Clicks Daily Activity     Outcome Measure Help from another person eating meals?: None Help from another person taking care of personal grooming?: A Little Help from another person toileting, which includes using toliet, bedpan, or urinal?: A Little Help from another person bathing (including washing, rinsing, drying)?: A Little Help from another person to put on and taking off regular upper body clothing?: A Little Help from another person to put on and taking off regular lower body clothing?: A Little 6 Click Score: 19   End of Session Equipment Utilized During Treatment: Gait belt Nurse Communication: Mobility  status  Activity Tolerance: Patient tolerated treatment well Patient left: in chair;with call bell/phone within reach  OT Visit Diagnosis: Muscle weakness (generalized) (M62.81)                Time: 9191-9168 OT Time Calculation (min): 23 min Charges:  OT General Charges $OT Visit: 1 Visit OT Evaluation $OT Eval Moderate Complexity: 1 Mod  Alizzon Dioguardi C, OT  Acute Rehabilitation Services Office [phone] Secure chat preferred   Adrianne GORMAN Savers 05/02/2024, 8:46 AM

## 2024-05-02 NOTE — Progress Notes (Signed)
 DISCHARGE NOTE HOME Todd Spears to be discharged Home per MD order. Discussed prescriptions and follow up appointments with the patient. Prescriptions given to patient; medication list explained in detail. Patient verbalized understanding.  Skin clean, dry and intact without evidence of skin break down, no evidence of skin tears noted. IV catheter discontinued intact. Site without signs and symptoms of complications. Dressing and pressure applied. Pt denies pain at the site currently. No complaints noted.  See LDA for surgical incision at discharge chest and leg (per notes) Patient free of lines, drains, and wounds.   An After Visit Summary (AVS) was printed and given to the patient. Patient escorted via wheelchair, and discharged home via private auto.  [name], RN

## 2024-05-02 NOTE — Progress Notes (Signed)
 Todd Spears to be discharged home per MD order. DR RN Peyton discussed with the patient and wife and all questions answered.  Skin clean and dry, midsternal incision and leg incisions CDI. IV catheter discontinued intact. Site without signs and symptoms of complications. Dressing and pressure applied.  An After Visit Summary was printed and given to the patient.  Patient escorted via [facility], and discharged home via private auto.  Todd Spears  05/02/2024 11:59 AM

## 2024-05-02 NOTE — Progress Notes (Signed)
 Physical Therapy Treatment Patient Details Name: Todd Spears MRN: [id number] DOB: 22-Aug-1945 Today's Date: 05/02/2024   History of Present Illness Pt is a 78 y/o male admitted 11/24 s/p CABG x4, but Aortic valve deferred to a near future period of time.  PMH HTN, HLD, CKD, CAD, GERD, OA, gout,    PT Comments  Continuing work on functional mobility and activity tolerance;  session focused on bed mobility and functional transfers in prep fro dc home (pt had walked the hallways earlier with RW); Showeing good carryover of education; OK for dc home from PT standpoint    If plan is discharge home, recommend the following: A little help with walking and/or transfers;A little help with bathing/dressing/bathroom;Assistance with cooking/housework;Assist for transportation   Can travel by private vehicle        Equipment Recommendations  None recommended by PT    Recommendations for Other Services       Precautions / Restrictions Precautions Precautions: Fall;Sternal Precaution Booklet Issued: Yes (comment) (has HF booklet) Recall of Precautions/Restrictions: Intact Restrictions Other Position/Activity Restrictions: cardiac sternal precautions     Mobility  Bed Mobility Overal bed mobility: Needs Assistance Bed Mobility: Rolling, Sidelying to Sit, Sit to Sidelying Rolling: Supervision Sidelying to sit: Min assist, Contact guard assist     Sit to sidelying: Contact guard assist General bed mobility comments: Uses momentum well; very light min assist to help weight shift trunk to fully upright sitting from sidelying    Transfers Overall transfer level: Needs assistance Equipment used: Rolling walker (2 wheels) Transfers: Sit to/from Stand Sit to Stand: Contact guard assist           General transfer comment: Light contact to encourage slightly more weight shift forward; hugs heart pillow adn focuses on LEs to push up    Ambulation/Gait Ambulation/Gait assistance:  Supervision Gait Distance (Feet): 20 Feet Assistive device: Rolling walker (2 wheels) Gait Pattern/deviations: Step-through pattern       General Gait Details: generally steady, but weak enough for appropriate use of the RW   Stairs             Wheelchair Mobility     Tilt Bed    Modified Rankin (Stroke Patients Only)       Balance Overall balance assessment: Mild deficits observed, not formally tested                                          Communication Communication Communication: No apparent difficulties  Cognition Arousal: Alert Behavior During Therapy: WFL for tasks assessed/performed   PT - Cognitive impairments: No apparent impairments                         Following commands: Intact      Cueing Cueing Techniques: Verbal cues, Visual cues  Exercises      General Comments General comments (skin integrity, edema, etc.): VSS on RA      Pertinent Vitals/Pain Pain Assessment Pain Assessment: No/denies pain Pain Intervention(s): Monitored during session    Home Living                          Prior Function            PT Goals (current goals can now be found in the care plan section) Acute Rehab PT Goals Patient  Stated Goal: Independent, back to work PT Goal Formulation: With patient Time For Goal Achievement: 05/08/24 Potential to Achieve Goals: Good Progress towards PT goals: Progressing toward goals    Frequency    Min 3X/week      PT Plan      Co-evaluation              AM-PAC PT 6 Clicks Mobility   Outcome Measure  Help needed turning from your back to your side while in a flat bed without using bedrails?: A Little Help needed moving from lying on your back to sitting on the side of a flat bed without using bedrails?: A Little Help needed moving to and from a bed to a chair (including a wheelchair)?: A Little Help needed standing up from a chair using your arms (e.g.,  wheelchair or bedside chair)?: A Little Help needed to walk in hospital room?: A Little Help needed climbing 3-5 steps with a railing? : A Little 6 Click Score: 18    End of Session   Activity Tolerance: Patient tolerated treatment well Patient left: in chair;with call bell/phone within reach Nurse Communication: Mobility status (OK for DC) PT Visit Diagnosis: Other abnormalities of gait and mobility (R26.89)     Time: 0943-1000 PT Time Calculation (min) (ACUTE ONLY): 17 min  Charges:    $Therapeutic Activity: 8-22 mins PT General Charges $$ ACUTE PT VISIT: 1 Visit                     Silvano Currier, PT  Acute Rehabilitation Services Office [phone] Secure Chat welcomed    Silvano VEAR Currier 05/02/2024, 12:17 PM

## 2024-05-02 NOTE — Progress Notes (Addendum)
      [address] [employer] [address]             [phone]         6 Days Post-Op Procedure(s) (LRB): CORONARY ARTERY BYPASS GRAFTING (CABG) TIMES 4 USING LEFT INTERNAL MAMMARY ARTERY AND ENDOSCOPICALLY HARVESTED RIGHT GREATER SAPHENOUS VEIN (N/A) REPLACEMENT, AORTIC VALVE, OPEN (N/A) ECHOCARDIOGRAM, TRANSESOPHAGEAL, INTRAOPERATIVE (N/A)  Subjective:  Patient doing very well.  He has already been up and ambulating in the hallway.  He states he notices an improvement in his LE swelling.  Objective: Vital signs in last 24 hours: Temp:  [96.9 F (36.1 C)-98.4 F (36.9 C)] 96.9 F (36.1 C) (11/30 0751) Pulse Rate:  [83-99] 99 (11/30 0751) Cardiac Rhythm: Sinus tachycardia;Heart block (11/30 0752) Resp:  [14-22] 17 (11/30 0350) BP: (107-157)/(59-79) 138/76 (11/30 0751) SpO2:  [95 %-100 %] 100 % (11/30 0751) Weight:  [106 kg] 106 kg (11/30 0620)  Intake/Output from previous day: 11/29 0701 - 11/30 0700 In: 120 [P.O.:120] Out: 1170 [Urine:1170]  General appearance: alert, cooperative, and no distress Heart: regular rate and rhythm Lungs: clear to auscultation bilaterally Abdomen: soft, non-tender; bowel sounds normal; no masses,  no organomegaly Extremities: edema + pitting, less tight today Wound: clean and dry  Lab Results: No results for input(s): WBC, HGB, HCT, PLT in the last 72 hours. BMET:  Recent Labs    04/30/24 0256 05/01/24 0311  NA 139 135  K 3.7 3.7  CL 107 103  CO2 23 25  GLUCOSE 102* 105*  BUN 37* 33*  CREATININE 1.54* 1.51*  CALCIUM  8.2* 8.3*    PT/INR: No results for input(s): LABPROT, INR in the last 72 hours. ABG    Component Value Date/Time   PHART 7.327 (L) 04/26/2024 1821   HCO3 20.9 04/26/2024 1821   TCO2 22 04/26/2024 1821   ACIDBASEDEF 5.0 (H) 04/26/2024 1821   O2SAT 97 04/26/2024 1821   CBG (last 3)  Recent Labs    05/01/24 1639 05/01/24 2109 05/02/24 0606  GLUCAP 131* 128* 123*     Assessment/Plan: S/P Procedure(s) (LRB): CORONARY ARTERY BYPASS GRAFTING (CABG) TIMES 4 USING LEFT INTERNAL MAMMARY ARTERY AND ENDOSCOPICALLY HARVESTED RIGHT GREATER SAPHENOUS VEIN (N/A) REPLACEMENT, AORTIC VALVE, OPEN (N/A) ECHOCARDIOGRAM, TRANSESOPHAGEAL, INTRAOPERATIVE (N/A)  CV- PAF, maintaining NSR with 1st degree block currently continue Amiodarone , Norvasc  for BP control Pulm- continue IS at discharge, not requiring oxygen REnal- pitting edema persists, responding well to Demadex  will continue DM- sugars are well controlled Deconditioning- patient ambulating with walker w/o difficulty, family would like H/H PT..will order, however not recommended per PT Eval D/c HOme today   LOS: 6 days    Rocky Shad, [name] 05/02/2024 9:14 AM  Patient doing great this morning.  Cleared by PT/OT for home.  Leg edema, while still with pitting edema, is better on demedex 20 daily so will continue that for home.  Ambulated multiple times in the hall and is saturating well on RA. Weight is 233 lb today from 242 lb but unsure that's real. Regardless will continue diuresis for home until follow-up visit.  Con Clunes, MD Cardiothoracic Surgery Pager: [phone]

## 2024-05-02 NOTE — Progress Notes (Signed)
 RNCM received DME and PT orders for patient.  RNCM spoke with patient and offered choice for [facility] services.  Patient with no preference so Adriana at [facility] contacted with HHPT order and confirmation received.  Patient in  agreement to services.  Jermaine at [facility] contacted with DME order for tub bench and confirmation received.  DME to be delivered to patient's room prior to d/c home today.

## 2024-05-03 ENCOUNTER — Telehealth: Payer: Self-pay | Admitting: *Deleted

## 2024-05-03 ENCOUNTER — Telehealth (HOSPITAL_COMMUNITY): Payer: Self-pay

## 2024-05-03 NOTE — Telephone Encounter (Signed)
 Called patient to see if he is interested in the Cardiac Rehab Program. Patient expressed interest. Explained scheduling process and went over insurance, patient verbalized understanding. Will contact patient for scheduling once f/u has been completed.

## 2024-05-03 NOTE — Transitions of Care (Post Inpatient/ED Visit) (Signed)
 05/03/2024  Name: Todd Spears MRN: [id number] DOB: [birth year]  Today's TOC FU Call Status: Today's TOC FU Call Status:: Successful TOC FU Call Completed TOC FU Call Complete Date: 05/03/24  Patient's Name and Date of Birth confirmed.    Transition Care Management Follow-up Telephone Call Date of Discharge: 05/02/24 Discharge Facility: [facility]) Type of Discharge: Inpatient Admission Primary Inpatient Discharge Diagnosis:: Coronary artery disease How have you been since you were released from the hospital?: Better Any questions or concerns?: No  Items Reviewed: Did you receive and understand the discharge instructions provided?: Yes Medications obtained,verified, and reconciled?: Yes (Medications Reviewed) Any new allergies since your discharge?: No Dietary orders reviewed?: Yes Type of Diet Ordered:: Low sodium Heart Healthy diet Do you have support at home?: Yes People in Home [RPT]: spouse Name of Support/Comfort Primary Source: Romero  Medications Reviewed Today: Medications Reviewed Today     Reviewed by Kennieth Cathlean DEL, RN (Case Manager) on 05/03/24 at 1449  Med List Status: <None>   Medication Order Taking? Sig Documenting Provider Last Dose Status Informant  albuterol  (VENTOLIN  HFA) 108 (90 Base) MCG/ACT inhaler [id number] Yes Inhale 2 puffs into the lungs every 6 (six) hours as needed for wheezing or shortness of breath. Kennyth Worth HERO, MD  Active Self  amiodarone  (PACERONE ) 200 MG tablet [id number] Yes Take 1 tablet (200 mg total) by mouth 2 (two) times daily. Barrett, Erin R, [name]  Active   amLODipine  (NORVASC ) 5 MG tablet [id number] Yes Take 1 tablet (5 mg total) by mouth daily. Barrett, Rocky JONELLE RIGGERS  Active   aspirin  81 MG chewable tablet [id number] Yes Chew 81 mg by mouth daily. [provider]  Active Self  Azelastine  HCl 137 MCG/SPRAY SOLN [id number] Yes INSTILL 2 SPRAYS INTO BOTH NOSTRILS 2 TIMES DAILY AS DIRECTED  Patient taking differently:  Place 1 spray into both nostrils daily as needed (Congestion).   Kennyth Worth HERO, MD  Active Self  cetirizine (ZYRTEC) 10 MG tablet [id number] Yes Take 10 mg by mouth daily. [provider]  Active Self  Cyanocobalamin  (VITAMIN B 12 PO) [id number] Yes Take 1 tablet by mouth daily.  [provider]  Active Self  esomeprazole  (NEXIUM ) 40 MG capsule [id number] Yes TAKE 1 CAPSULE BY MOUTH DAILY BEFORE A MEAL Kennyth Worth HERO, MD  Active Self  ezetimibe  (ZETIA ) 10 MG tablet [id number] Yes TAKE 1 TABLET BY MOUTH EVERY DAY Jordan, Peter M, MD  Active Self  FeFum-FePoly-FA-B Cmp-C-Biot (INTEGRA PLUS ) CAPS [id number] Yes TAKE 1 CAPSULE BY MOUTH DAILY Kennyth Worth HERO, MD  Active Self  fluticasone  (FLONASE ) 50 MCG/ACT nasal spray [id number] Yes Place 2 sprays into both nostrils 2 (two) times daily.  Patient taking differently: Place 2 sprays into both nostrils 2 (two) times daily as needed for rhinitis.   Tobie Eldora NOVAK, MD  Active Self  LORazepam  (ATIVAN ) 0.5 MG tablet [id number] Yes Take 0.5 mg daily as needed Jordan, Peter M, MD  Active Self  LUMIGAN 0.01 % SOLN [id number] Yes Place 1 drop into both eyes at bedtime. [provider]  Active Self  montelukast  (SINGULAIR ) 10 MG tablet [id number] Yes TAKE 1 TABLET BY MOUTH EVERYDAY AT BEDTIME Kennyth Worth HERO, MD  Active Self  Multiple Vitamin (MULTIVITAMIN WITH MINERALS) TABS tablet [id number] Yes Take 1 tablet by mouth daily. Adult 50+ [provider]  Active Self  nitroGLYCERIN  (NITROSTAT ) 0.4 MG SL tablet [id number] Yes Place 1 tablet (0.4 mg total) under the tongue every 5 (five)  minutes as needed for chest pain (up to 3 doses. not within 24hrs of Viagra ). Kennyth Worth HERO, MD  Active Self           Med Note NANI, JOHNETTA LITTIE Kitchens Apr 26, 2024  6:22 AM) Patient states he has never taken nitro  oxyCODONE  (OXY IR/ROXICODONE ) 5 MG immediate release tablet [id number] Yes Take 1 tablet (5 mg total) by mouth every 4 (four) hours as needed  for severe pain (pain score 7-10). Barrett, Rocky SAUNDERS, [name]  Active   potassium chloride  SA (KLOR-CON  M) 20 MEQ tablet [id number] Yes Take 1 tablet (20 mEq total) by mouth daily. Jordan, Peter M, MD  Active Self  pravastatin  (PRAVACHOL ) 10 MG tablet [id number] Yes TAKE 1 TABLET EVERY OTHER DAY, ALTERNATING WITH 2 TABLETS. Kennyth Worth HERO, MD  Active Self  tamsulosin  (FLOMAX ) 0.4 MG CAPS capsule [id number] Yes Take 1 capsule (0.4 mg total) by mouth daily after breakfast. Barrett, Erin R, [name]  Active   torsemide  (DEMADEX ) 20 MG tablet [id number] Yes Take 1 tablet (20 mg total) by mouth daily. Barrett, Rocky SAUNDERS, [name]  Active             Home Care and Equipment/Supplies: Were Home Health Services Ordered?: NA Any new equipment or medical supplies ordered?: NA  Functional Questionnaire: Do you need assistance with bathing/showering or dressing?: No Do you need assistance with meal preparation?: No Do you need assistance with eating?: No Do you have difficulty maintaining continence: No Do you need assistance with getting out of bed/getting out of a chair/moving?: No Do you have difficulty managing or taking your medications?: No  Follow up appointments reviewed: PCP Follow-up appointment confirmed?: No MD Provider Line Number:[phone] Given: No (Patient will make follow up appointment with PCP) Specialist Hospital Follow-up appointment confirmed?: Yes Date of Specialist follow-up appointment?: 05/12/24 Follow-Up Specialty Provider:: 1210 Peter Jordan 9:20  12/10 Manuelita Rough 2:30  1215 Eldora Blanch 1:00 Do you need transportation to your follow-up appointment?: No Do you understand care options if your condition(s) worsen?: Yes-patient verbalized understanding  SDOH Interventions Today    Flowsheet Row Most Recent Value  SDOH Interventions   Food Insecurity Interventions Intervention Not Indicated  Housing Interventions Intervention Not Indicated  Transportation Interventions  Intervention Not Indicated, Patient Resources (Friends/Family)  Utilities Interventions Intervention Not Indicated    Goals Addressed             This Visit's Progress    VBCI Transitions of Care (TOC) Care Plan       Problems:  Recent Hospitalization for treatment of CAD and CABG x 4 Knowledge Deficit Related to CAD  Goal:  Over the next 30 days, the patient will not experience hospital readmission  Interventions:   CAD Interventions: Assessed understanding of CAD diagnosis Medications reviewed including medications utilized in CAD treatment plan Provided education on Importance of limiting foods high in cholesterol Reviewed Importance of taking all medications as prescribed Reviewed Importance of attending all scheduled provider appointments  Patient Self Care Activities:  Attend all scheduled provider appointments Call pharmacy for medication refills 3-7 days in advance of running out of medications Call provider office for new concerns or questions  Notify RN Care Manager of TOC call rescheduling needs Participate in Transition of Care Program/Attend TOC scheduled calls Perform all self care activities independently  Take medications as prescribed   Monitor for fluid weight gain Follow up with outpatient rehab  Plan:  An initial telephone outreach has been scheduled  for: 05/13/2024 Follow up with provider re: any edema, shortness of breath or weight gain Next PCP appointment scheduled for: Patient will call and schedule follow up appointment Telephone follow up appointment with care management team member scheduled for:  05/13/2024 1:00 Mliss Creed      Discussed and offered 30 day TOC program.  Patient   accepted.  The patient has been provided with contact information for the care management team and has been advised to call with any health -related questions or concerns.  The patient verbalized understanding with current plan of care.  The patient is directed to  their insurance card regarding availability of benefits coverage   Cathlean Headland BSN RN [facility] Health Care Management Coordinator [email or web address] Direct Dial: [phone]  Fax: [phone] Website: [email or web address]

## 2024-05-05 ENCOUNTER — Telehealth: Payer: Self-pay | Admitting: *Deleted

## 2024-05-05 NOTE — Telephone Encounter (Signed)
 Copied from CRM #[id number]. Topic: Clinical - Home Health Verbal Orders >> May 05, 2024  2:26 PM Dedra NOVAK wrote: Caller/Agency: [place], PT from [facility] Callback Number: [phone] Service Requested: Physical Therapy Frequency: 1w1 followed by 2w4 Any new concerns about the patient? No   Ok to give VO? Please advice

## 2024-05-06 NOTE — Telephone Encounter (Signed)
 Florence Barefoot, PT from [facility] Number: [phone] Vo given

## 2024-05-06 NOTE — Telephone Encounter (Signed)
 Ok with me. Please place any necessary orders.

## 2024-05-08 LAB — BPAM RBC
Blood Product Expiration Date: 202512132359
Blood Product Expiration Date: 202512152359
ISSUE DATE / TIME: 202511240827
ISSUE DATE / TIME: 202511240827
Unit Type and Rh: 1700
Unit Type and Rh: 1700

## 2024-05-08 LAB — TYPE AND SCREEN
ABO/RH(D): B NEG
Antibody Screen: NEGATIVE
Unit division: 0
Unit division: 0

## 2024-05-11 ENCOUNTER — Other Ambulatory Visit: Payer: Self-pay | Admitting: *Deleted

## 2024-05-11 ENCOUNTER — Other Ambulatory Visit: Payer: Self-pay | Admitting: Surgery

## 2024-05-11 DIAGNOSIS — I350 Nonrheumatic aortic (valve) stenosis: Secondary | ICD-10-CM

## 2024-05-11 NOTE — Patient Instructions (Signed)
 Visit Information  Thank you for taking time to visit with me today. Please don't hesitate to contact me if I can be of assistance to you before our next scheduled telephone appointment.  Our next appointment is by telephone on 05/18/24 @ 1030 am  Following is a copy of your care plan:   Goals Addressed             This Visit's Progress    VBCI Transitions of Care (TOC) Care Plan       Problems:  Recent Hospitalization for treatment of CAD and CABG x 4 Knowledge Deficit Related to CAD 05/11/24- spoke with pt, no new concerns reported  Goal:  Over the next 30 days, the patient will not experience hospital readmission  Interventions:  CAD Interventions: Assessed understanding of CAD diagnosis Medications reviewed including medications utilized in CAD treatment plan Provided education on Importance of limiting foods high in cholesterol Reviewed Importance of taking all medications as prescribed Reviewed Importance of attending all scheduled provider appointments Pain assessment completed Reviewed sternal precautions Reviewed signs /symptoms of infection Reviewed weight, BP, HR log, encouraged pt to continue keeping a log and take to provider appointments  Patient Self Care Activities:  Attend all scheduled provider appointments Call pharmacy for medication refills 3-7 days in advance of running out of medications Call provider office for new concerns or questions  Notify RN Care Manager of TOC call rescheduling needs Participate in Transition of Care Program/Attend TOC scheduled calls Perform all self care activities independently  Take medications as prescribed   Monitor for fluid weight gain, continue daily weights Follow up with outpatient rehab  Plan:  Follow up with provider re: any edema, shortness of breath or weight gain, signs of infection Next PCP appointment scheduled for: Patient will call and schedule follow up appointment Telephone follow up appointment with  care management team member scheduled for:  05/18/24 @ 1030 am        Care plan and visit instructions communicated with the patient verbally today. Patient agrees to receive a copy in MyChart. Active MyChart status and patient understanding of how to access instructions and care plan via MyChart confirmed with patient.     Telephone follow up appointment with care management team member scheduled for: 05/18/24 @ 1030 am  Please call the care guide team at [phone] if you need to cancel or reschedule your appointment.   Please call the Suicide and Crisis Lifeline: 988 call the [place]  National Suicide Prevention Lifeline: 909-457-3806 or TTY: 208-813-6653 TTY (430) 071-0285) to talk to a trained counselor call 1-800-273-TALK (toll free, 24 hour hotline) go to [facility] [address], [place] [phone]) call the [facility] Crisis Line: 3438670792 call 911 if you are experiencing a Mental Health or Behavioral Health Crisis or need someone to talk to.  Mliss Creed [facility], BSN RN Care Manager/ Transition of Care [place]/ [facility] [phone]

## 2024-05-11 NOTE — Patient Outreach (Signed)
 Transition of Care week 2  Visit Note  05/11/2024  Name: Todd Spears MRN: [id number]          DOB: [birth year]  Situation: Patient enrolled in [facility] 30-day program. Visit completed with patient by telephone.   Background: Transition of Care Management Follow-up Telephone Call Discharge Date and Diagnosis: 05/02/24, Coronary artery disease   Past Medical History:  Diagnosis Date   Allergic rhinitis    Anemia    Aortic stenosis    Asthma    CAD (coronary artery disease)    a. s/p PTCA/stenting of the mid LAD and mid RCA 04/06/12   Carotid bruit    r   Colon polyp    Diverticulosis    ED (erectile dysfunction)    Fatty liver    GERD (gastroesophageal reflux disease)    Gout    Hyperlipidemia    Hypertension    Obesity    Osteoarthritis    Renal insufficiency    Restless leg syndrome     Assessment: Patient Reported Symptoms: Cognitive Cognitive Status: No symptoms reported, Alert and oriented to person, place, and time, Able to follow simple commands, Normal speech and language skills      Neurological Neurological Review of Symptoms: No symptoms reported    HEENT HEENT Symptoms Reported: No symptoms reported      Cardiovascular Cardiovascular Symptoms Reported:  (RN CM reviewed sternal precautions, no heavy lifting) Does patient have uncontrolled Hypertension?: No Weight: 221 lb (100.2 kg) Cardiovascular Self-Management Outcome: 4 (good) Cardiovascular Comment: pt reports he is using heart pillow for when I have to cough or sneeze, states everything looks good  Respiratory Respiratory Symptoms Reported: No symptoms reported    Endocrine Endocrine Symptoms Reported: No symptoms reported Is patient diabetic?: No    Gastrointestinal Gastrointestinal Symptoms Reported: No symptoms reported      Genitourinary Genitourinary Symptoms Reported: No symptoms reported    Integumentary Integumentary Symptoms Reported: Incision Additional Integumentary Details: pt  reports  my incision looks good Skin Management Strategies: Routine screening, Adequate rest Skin Self-Management Outcome: 4 (good) Skin Comment: pt continues showering with antibacterial soap,  reviewed signs / symptoms of infection  Musculoskeletal Musculoskelatal Symptoms Reviewed: No symptoms reported        Psychosocial Psychosocial Symptoms Reported: No symptoms reported         Today's Vitals   05/11/24 0959  BP: 133/76  Pulse: 88  Weight: 221 lb (100.2 kg)   Pain Scale: 0-10 Pain Score: 0-No pain  Medications Reviewed Today     Reviewed by Aura Mliss DELENA, RN (Registered Nurse) on 05/11/24 at [phone]  Med List Status: <None>   Medication Order Taking? Sig Documenting Provider Last Dose Status Informant  albuterol  (VENTOLIN  HFA) 108 (90 Base) MCG/ACT inhaler [id number]  Inhale 2 puffs into the lungs every 6 (six) hours as needed for wheezing or shortness of breath. Kennyth Worth HERO, MD  Active Self  amiodarone  (PACERONE ) 200 MG tablet [id number]  Take 1 tablet (200 mg total) by mouth 2 (two) times daily. Barrett, Rocky SAUNDERS, [name]  Active   amLODipine  (NORVASC ) 5 MG tablet [id number]  Take 1 tablet (5 mg total) by mouth daily. Barrett, Rocky SAUNDERS RIGGERS  Active   aspirin  81 MG chewable tablet [id number]  Chew 81 mg by mouth daily. [provider]  Active Self  Azelastine  HCl 137 MCG/SPRAY SOLN [id number]  INSTILL 2 SPRAYS INTO BOTH NOSTRILS 2 TIMES DAILY AS DIRECTED  Patient taking differently: Place 1 spray into both  nostrils daily as needed (Congestion).   Kennyth Worth HERO, MD  Active Self  cetirizine (ZYRTEC) 10 MG tablet [id number]  Take 10 mg by mouth daily. [provider]  Active Self  Cyanocobalamin  (VITAMIN B 12 PO) [id number]  Take 1 tablet by mouth daily.  [provider]  Active Self  esomeprazole  (NEXIUM ) 40 MG capsule [id number]  TAKE 1 CAPSULE BY MOUTH DAILY BEFORE A MEAL Kennyth Worth HERO, MD  Active Self  ezetimibe  (ZETIA ) 10 MG tablet [id number]  TAKE 1  TABLET BY MOUTH EVERY DAY Jordan, Peter M, MD  Active Self  FeFum-FePoly-FA-B Cmp-C-Biot (INTEGRA PLUS ) CAPS [id number]  TAKE 1 CAPSULE BY MOUTH DAILY Kennyth Worth HERO, MD  Active Self  fluticasone  (FLONASE ) 50 MCG/ACT nasal spray [id number]  Place 2 sprays into both nostrils 2 (two) times daily.  Patient taking differently: Place 2 sprays into both nostrils 2 (two) times daily as needed for rhinitis.   Tobie Eldora NOVAK, MD  Active Self  LORazepam  (ATIVAN ) 0.5 MG tablet [id number]  Take 0.5 mg daily as needed Jordan, Peter M, MD  Active Self  LUMIGAN 0.01 % SOLN [id number]  Place 1 drop into both eyes at bedtime. [provider]  Active Self  montelukast  (SINGULAIR ) 10 MG tablet [id number]  TAKE 1 TABLET BY MOUTH EVERYDAY AT BEDTIME Kennyth Worth HERO, MD  Active Self  Multiple Vitamin (MULTIVITAMIN WITH MINERALS) TABS tablet [id number]  Take 1 tablet by mouth daily. Adult 50+ [provider]  Active Self  nitroGLYCERIN  (NITROSTAT ) 0.4 MG SL tablet [id number]  Place 1 tablet (0.4 mg total) under the tongue every 5 (five) minutes as needed for chest pain (up to 3 doses. not within 24hrs of Viagra ). Kennyth Worth HERO, MD  Active Self           Med Note NANI, JOHNETTA LITTIE Kitchens Apr 26, 2024  6:22 AM) Patient states he has never taken nitro  oxyCODONE  (OXY IR/ROXICODONE ) 5 MG immediate release tablet [id number]  Take 1 tablet (5 mg total) by mouth every 4 (four) hours as needed for severe pain (pain score 7-10). Barrett, Rocky SAUNDERS, [name]  Active   potassium chloride  SA (KLOR-CON  M) 20 MEQ tablet [id number]  Take 1 tablet (20 mEq total) by mouth daily. Jordan, Peter M, MD  Active Self  pravastatin  (PRAVACHOL ) 10 MG tablet [id number]  TAKE 1 TABLET EVERY OTHER DAY, ALTERNATING WITH 2 TABLETS. Kennyth Worth HERO, MD  Active Self  tamsulosin  (FLOMAX ) 0.4 MG CAPS capsule [id number]  Take 1 capsule (0.4 mg total) by mouth daily after breakfast. Barrett, Erin R, [name]  Active   torsemide  (DEMADEX ) 20 MG tablet  [id number]  Take 1 tablet (20 mg total) by mouth daily. Barrett, Rocky SAUNDERS, [name]  Active             Goals Addressed             This Visit's Progress    [facility] Transitions of Care (TOC) Care Plan       Problems:  Recent Hospitalization for treatment of CAD and CABG x 4 Knowledge Deficit Related to CAD 05/11/24- spoke with pt, no new concerns reported  Goal:  Over the next 30 days, the patient will not experience hospital readmission  Interventions:  CAD Interventions: Assessed understanding of CAD diagnosis Medications reviewed including medications utilized in CAD treatment plan Provided education on Importance of limiting foods high in cholesterol Reviewed Importance of taking all medications as prescribed Reviewed Importance of attending all  scheduled provider appointments Pain assessment completed Reviewed sternal precautions Reviewed signs /symptoms of infection Reviewed weight, BP, HR log, encouraged pt to continue keeping a log and take to provider appointments  Patient Self Care Activities:  Attend all scheduled provider appointments Call pharmacy for medication refills 3-7 days in advance of running out of medications Call provider office for new concerns or questions  Notify RN Care Manager of TOC call rescheduling needs Participate in Transition of Care Program/Attend TOC scheduled calls Perform all self care activities independently  Take medications as prescribed   Monitor for fluid weight gain, continue daily weights Follow up with outpatient rehab  Plan:  Follow up with provider re: any edema, shortness of breath or weight gain, signs of infection Next PCP appointment scheduled for: Patient will call and schedule follow up appointment Telephone follow up appointment with care management team member scheduled for:  05/18/24 @ 1030 am         Recommendation:   PCP Follow-up  Follow Up Plan:   Telephone follow-up 05/18/24 @ 1030 am  Mliss Creed [facility],  BSN RN Care Manager/ Transition of Care [place]/ [facility] [phone]

## 2024-05-12 ENCOUNTER — Encounter: Payer: Self-pay | Admitting: Cardiology

## 2024-05-12 ENCOUNTER — Ambulatory Visit: Payer: Self-pay

## 2024-05-12 ENCOUNTER — Ambulatory Visit: Admitting: Cardiology

## 2024-05-12 ENCOUNTER — Ambulatory Visit
Admission: RE | Admit: 2024-05-12 | Discharge: 2024-05-12 | Disposition: A | Discharge status: Home / Self Care | Source: Ambulatory Visit | Attending: Cardiovascular Disease | Admitting: Cardiovascular Disease

## 2024-05-12 VITALS — BP 137/81 | HR 85 | Ht 67.0 in | Wt 221.4 lb

## 2024-05-12 VITALS — BP 154/80 | HR 90 | Resp 18 | Ht 67.0 in | Wt 221.0 lb

## 2024-05-12 DIAGNOSIS — E7800 Pure hypercholesterolemia, unspecified: Secondary | ICD-10-CM | POA: Diagnosis not present

## 2024-05-12 DIAGNOSIS — I480 Paroxysmal atrial fibrillation: Secondary | ICD-10-CM | POA: Diagnosis present

## 2024-05-12 DIAGNOSIS — Z951 Presence of aortocoronary bypass graft: Secondary | ICD-10-CM | POA: Insufficient documentation

## 2024-05-12 DIAGNOSIS — I350 Nonrheumatic aortic (valve) stenosis: Secondary | ICD-10-CM | POA: Diagnosis not present

## 2024-05-12 DIAGNOSIS — I2510 Atherosclerotic heart disease of native coronary artery without angina pectoris: Secondary | ICD-10-CM

## 2024-05-12 NOTE — Patient Instructions (Signed)
-  follow up in 3 weeks with chest xray

## 2024-05-12 NOTE — Progress Notes (Signed)
 Todd Spears Date of Birth: [birth year] Medical Record #[id number]  History of Present Illness: Todd Spears is seen back today for follow up CAD. He is s/p 2 vessel PCI with Promus DES for 90% mid LAD and 95% mid RCA disease on 04/06/2012. His EF is normal at 55%. At that time he really had no significant anginal symptoms but had an abnormal stress test.  Myoview  in Nov. 2014 showed a fixed inferior defect without ischemia. Last Myoview  in Feb. 2017 was unchanged. Other issues include CKD stage 3, HTN, obesity, gout,,OA, ED, carotid bruit and asthma.   He was admitted in January 2017 with tremors and hyponatremia. Chlorthalidone  was discontinued.   He had carotid dopplers showing 1-39% RICA and 60-79% LICA stenoses. Echo done in March 2017 showed normal LV function and mild AS unchanged from 2013. He has been followed in our lipid clinic for hypercholesterolemia with prior intolerance to statins. He is now on pravastatin  and Zetia .   He was seen last month with symptoms of dyspnea on exertion. A PET CT was performed demonstrating anterior ischemia and infarction in the inferior wall. EF 37%. This was felt to be a high risk scan. Her underwent cardiac cath on 10/17 showing three vessel and left main CAD. AV gradient of 24 mm Hg. EDP elevated at 24 mm Hg.   He was seen by Dr Lucas and underwent CABG x 4 on 04/26/24: this included LIMA to LAD, SVG to diagonal, SVG to OM and SVG to distal RCA. Did well post op. Did have Afib and converted to NSR. On amiodarone . Making an excellent recovery. Walking a lot. No edema. Has lost 24 lbs. No pain or SOB. Planning for TAVR once recovered from CABG   Allergies as of 05/12/2024       Reactions   Prednisone Shortness Of Breath, Swelling   Can tolerate as an injection but not orally   Rosuvastatin  Other (See Comments)   myalgia   Crestor  [rosuvastatin  Calcium ] Other (See Comments)   myalgia   Gabapentin  Other (See Comments)   Lyrica  [pregabalin ]    swelling         Medication List        Accurate as of May 12, 2024  9:08 AM. If you have any questions, ask your nurse or doctor.          albuterol  108 (90 Base) MCG/ACT inhaler Commonly known as: VENTOLIN  HFA Inhale 2 puffs into the lungs every 6 (six) hours as needed for wheezing or shortness of breath.   amiodarone  200 MG tablet Commonly known as: PACERONE  Take 1 tablet (200 mg total) by mouth 2 (two) times daily.   amLODipine  5 MG tablet Commonly known as: NORVASC  Take 1 tablet (5 mg total) by mouth daily.   aspirin  81 MG chewable tablet Chew 81 mg by mouth daily.   Azelastine  HCl 137 MCG/SPRAY Soln INSTILL 2 SPRAYS INTO BOTH NOSTRILS 2 TIMES DAILY AS DIRECTED What changed: See the new instructions.   cetirizine 10 MG tablet Commonly known as: ZYRTEC Take 10 mg by mouth daily.   esomeprazole  40 MG capsule Commonly known as: NEXIUM  TAKE 1 CAPSULE BY MOUTH DAILY BEFORE A MEAL   ezetimibe  10 MG tablet Commonly known as: ZETIA  TAKE 1 TABLET BY MOUTH EVERY DAY   fluticasone  50 MCG/ACT nasal spray Commonly known as: FLONASE  Place 2 sprays into both nostrils 2 (two) times daily. What changed:  when to take this reasons to take this   Integra Plus   Caps TAKE 1 CAPSULE BY MOUTH DAILY   LORazepam  0.5 MG tablet Commonly known as: Ativan  Take 0.5 mg daily as needed   Lumigan 0.01 % Soln Generic drug: bimatoprost Place 1 drop into both eyes at bedtime.   montelukast  10 MG tablet Commonly known as: SINGULAIR  TAKE 1 TABLET BY MOUTH EVERYDAY AT BEDTIME   multivitamin with minerals Tabs tablet Take 1 tablet by mouth daily. Adult 50+   nitroGLYCERIN  0.4 MG SL tablet Commonly known as: Nitrostat  Place 1 tablet (0.4 mg total) under the tongue every 5 (five) minutes as needed for chest pain (up to 3 doses. not within 24hrs of Viagra ).   oxyCODONE  5 MG immediate release tablet Commonly known as: Oxy IR/ROXICODONE  Take 1 tablet (5 mg total) by mouth every 4  (four) hours as needed for severe pain (pain score 7-10).   potassium chloride  SA 20 MEQ tablet Commonly known as: KLOR-CON  M Take 1 tablet (20 mEq total) by mouth daily.   pravastatin  10 MG tablet Commonly known as: PRAVACHOL  TAKE 1 TABLET EVERY OTHER DAY, ALTERNATING WITH 2 TABLETS.   tamsulosin  0.4 MG Caps capsule Commonly known as: FLOMAX  Take 1 capsule (0.4 mg total) by mouth daily after breakfast.   torsemide  20 MG tablet Commonly known as: DEMADEX  Take 1 tablet (20 mg total) by mouth daily.   VITAMIN B 12 PO Take 1 tablet by mouth daily.         Allergies  Allergen Reactions   Prednisone Shortness Of Breath and Swelling    Can tolerate as an injection but not orally   Rosuvastatin  Other (See Comments)    myalgia   Crestor  [Rosuvastatin  Calcium ] Other (See Comments)    myalgia   Gabapentin  Other (See Comments)   Lyrica  [Pregabalin ]     swelling    Past Medical History:  Diagnosis Date   Allergic rhinitis    Anemia    Aortic stenosis    Asthma    CAD (coronary artery disease)    a. s/p PTCA/stenting of the mid LAD and mid RCA 04/06/12   Carotid bruit    r   Colon polyp    Diverticulosis    ED (erectile dysfunction)    Fatty liver    GERD (gastroesophageal reflux disease)    Gout    Hyperlipidemia    Hypertension    Obesity    Osteoarthritis    Renal insufficiency    Restless leg syndrome     Past Surgical History:  Procedure Laterality Date   AORTIC VALVE REPLACEMENT N/A 04/26/2024   Procedure: REPLACEMENT, AORTIC VALVE, OPEN;  Surgeon: Lucas Dorise POUR, MD;  Location: [facility] OR;  Service: Open Heart Surgery;  Laterality: N/A;   BACK SURGERY  2020   COLONOSCOPY  04/23/2016   Dr.Perry   CORONARY ARTERY BYPASS GRAFT N/A 04/26/2024   Procedure: CORONARY ARTERY BYPASS GRAFTING (CABG) TIMES 4 USING LEFT INTERNAL MAMMARY ARTERY AND ENDOSCOPICALLY HARVESTED RIGHT GREATER SAPHENOUS VEIN;  Surgeon: Lucas Dorise POUR, MD;  Location: [facility] OR;  Service: Open  Heart Surgery;  Laterality: N/A;   INGUINAL HERNIA REPAIR Right 02/27/2016   Procedure: HERNIA REPAIR INGUINAL ADULT;  Surgeon: Donnice Bury, MD;  Location: [facility];  Service: General;  Laterality: Right;   INSERTION OF MESH Right 02/27/2016   Procedure: INSERTION OF MESH--IN AND OUT CATHETERIZATION PERFORMED AT END OF PROCEDURE TO DRAIN URINE.;  Surgeon: Donnice Bury, MD;  Location: [facility];  Service: General;  Laterality: Right;  INTRAOPERATIVE TRANSESOPHAGEAL ECHOCARDIOGRAM N/A 04/26/2024   Procedure: ECHOCARDIOGRAM, TRANSESOPHAGEAL, INTRAOPERATIVE;  Surgeon: Lucas Dorise POUR, MD;  Location: [facility] OR;  Service: Open Heart Surgery;  Laterality: N/A;   LEFT HEART CATH AND CORONARY ANGIOGRAPHY N/A 03/19/2024   Procedure: LEFT HEART CATH AND CORONARY ANGIOGRAPHY;  Surgeon: Anner Todd ORN, MD;  Location: [facility] IN[facility]IVE CV [facility];  Service: Cardiovascular;  Laterality: N/A;   LYMPH NODE BIOPSY     right side of neck 1999 - benign   PERCUTANEOUS CORONARY STENT INTERVENTION (PCI-S) N/A 04/06/2012   Procedure: PERCUTANEOUS CORONARY STENT INTERVENTION (PCI-S);  Surgeon: Tranae Laramie M Missie Gehrig, MD; Promus DES for 90% mid LAD and 95% mid RCA disease     UPPER GASTROINTESTINAL ENDOSCOPY  04/23/2016   Dr.Perry    Social History   Tobacco Use  Smoking Status Former   Current packs/day: 0.00   Types: Cigarettes   Quit date: 06/04/1975   Years since quitting: 48.9  Smokeless Tobacco Never    Social History   Substance and Sexual Activity  Alcohol Use No   Alcohol/week: 0.0 standard drinks of alcohol    Family History  Problem Relation Age of Onset   Hypertension Mother    Stroke Mother        Several    Stomach cancer Mother    Glaucoma Mother    Coronary artery disease Father        in his 37s   Heart disease Father    Heart attack Father    Hypertension Paternal Grandmother    Hypertension Paternal Grandfather    Colon cancer Neg Hx    Rectal  cancer Neg Hx     Review of Systems: The review of systems is per the HPI.  All other systems were reviewed and are negative.  Physical Exam: BP 137/81   Pulse 85   Ht 5' 7 (1.702 m)   Wt 221 lb 6.4 oz (100.4 kg)   SpO2 98%   BMI 34.68 kg/m  GENERAL:  Well appearing, obese WM  HEENT:  PERRL, EOMI, sclera are clear. Oropharynx is clear. NECK:  No jugular venous distention, carotid upstroke brisk and symmetric, no bruits, no thyromegaly or adenopathy LUNGS:  Clear to auscultation bilaterally CHEST:  sternal incision well healed.  HEART:  RRR,  PMI not displaced or sustained,S1 and S2 within normal limits, no S3, no S4: no clicks, no rubs, harsh gr 2/6 systolic murmur RUSB radiating to carotids.  ABD:  Soft, nontender. BS +, no masses or bruits. No hepatomegaly, no splenomegaly EXT:  no edema. SKIN:  Warm and dry.  No rashes NEURO:  Alert and oriented x 3. Cranial nerves II through XII intact. PSYCH:  Cognitively intact      LABORATORY DATA: Lab Results  Component Value Date   WBC 10.1 04/28/2024   HGB 9.7 (L) 04/28/2024   HCT 30.6 (L) 04/28/2024   PLT 168 04/28/2024   GLUCOSE 105 (H) 05/01/2024   CHOL 159 12/09/2023   TRIG 172.0 (H) 12/09/2023   HDL 49.50 12/09/2023   LDLDIRECT 71.0 06/27/2022   LDLCALC 75 12/09/2023   ALT 28 04/23/2024   AST 31 04/23/2024   NA 135 05/01/2024   K 3.7 05/01/2024   CL 103 05/01/2024   CREATININE 1.51 (H) 05/01/2024   BUN 33 (H) 05/01/2024   CO2 25 05/01/2024   TSH 2.73 12/09/2023   PSA 0.64 06/27/2022   INR 1.4 (H) 04/26/2024   HGBA1C 5.5 04/23/2024  Lexiscan  myoview  07/28/15: Study Highlights     There was no ST segment deviation noted during stress. The left ventricular ejection fraction is normal (55-65%). Nuclear stress EF: 56%. Defect 1: There is a medium defect of moderate severity present in the basal inferoseptal, basal inferior, mid inferior and apical inferior location. In setting of normal LVF, this  is consistent with diaphragmatic attenuation artifact. No ischemia noted. This is a low risk study.      Addendum by Wilbert JONELLE Bihari, MD on Fri Jul 28, 2015  2:03 PM    There was no ST segment deviation noted during stress. The left ventricular ejection fraction is normal (55-65%). Nuclear stress EF: 56%. Defect 1: There is a medium defect of moderate severity present in the basal inferoseptal, basal inferior, mid inferior and apical inferior location. In setting of normal LVF, this is consistent with diaphragmatic attenuation artifact. No ischemia noted. This is a low risk study.     Echo: 08/25/15: Study Conclusions   - Left ventricle: The cavity size was normal. Wall thickness was   normal. Systolic function was normal. The estimated ejection   fraction was in the range of 60% to 65%. Wall motion was normal;   there were no regional wall motion abnormalities. - Aortic valve: Moderately calcified annulus. Moderately calcified   leaflets. There was mild stenosis. - Mitral valve: There was mild regurgitation. - Left atrium: The atrium was mildly dilated  PET CT: Study Highlights Show Result Comparison    Findings are consistent with ischemia. The study is high risk.   No ST deviation was noted.   LV perfusion is abnormal. There is evidence of ischemia. Defect 1: There is a large defect with moderate reduction in uptake present in the apical to basal anterior, anterolateral and apex location(s) that is partially reversible. There is abnormal wall motion in the defect area. Consistent with ischemia. Defect 2: There is a medium defect with moderate reduction in uptake present in the apical to basal inferior location(s) that is fixed. There is abnormal wall motion in the defect area. Consistent with infarction.   Left ventricular function is abnormal. Nuclear stress EF: 37%. The left ventricular ejection fraction is moderately decreased (30-44%). End diastolic cavity size is severely enlarged.  End systolic cavity size is severely enlarged.   CT images were obtained for attenuation correction and were examined for the presence of coronary calcium  when appropriate.   Coronary calcium  assessment not performed due to prior revascularization.   Electronically signed by Jerel Balding, MD     LEFT HEART CATH AND CORONARY ANGIOGRAPHY   Conclusion      Ost RCA to Prox RCA lesion is 100% stenosed.  Prox RCA to Mid RCA lesion is 100% stenosed.  The RCA fills fills from left to right collaterals.   Ost LM to Ost LAD lesion is 50% stenosed with 80% stenosed side branch in Ost Cx to Prox Cx.  Ostial ramus lesion is 70% stenosed.   Mid LAD-1 lesion is 55% stenosed. Mid LAD-2 lesion is 50% stenosed.  Previously placed Mid LAD-3 stent of unknown type is widely patent.   In the absence of any other complications or medical issues, we expect the patient to be ready for discharge from a cath perspective on 03/19/2024.   Plan will be to discussed with Dr. Pearlie Nies and consider if he would be a potential candidate for CVTS consultation.  Need to reassess his symptoms.  He does have echocardiographic  evidence of moderate  to severe AS.  (24 mmHg per cath)   Recommend Aspirin  81mg  daily for moderate CAD.         100% flush occlusion of the RCA with retrograde filling from left-to-right collaterals filling all the way back to the occlusion. Eccentric Left Main Disease, difficult to interpret stenosis, cannot exclude up to 50%.  The LAD gives off a major diagonal branch just after the diagonal branch there is a focal 50% stenosis followed by another 50% stenosis. After large septal perforator there is a widely patent stent. The LAD reaches down around the apex and has mild diffuse luminal irregularities.. Bifurcation ostial LCx and RI 70 % RI, 80 % LCx stenosis.  Very torturous aorta iliac, abdominal aortic and aortic arch/innominate artery vessels making it very difficult to manipulate catheters either  from femoral or radial access.  Would not recommend right radial access in the future.  LVEDP 24 mmHg.      RECOMMENDATIONS   In the absence of any other complications or medical issues, we expect the patient to be ready for discharge from a cath perspective on 03/19/2024.   Plan will be to discussed with Dr. Kebin Maye and consider if he would be a potential candidate for CVTS consultation.  Need to reassess his symptoms.  He does have echocardiographic  evidence of moderate to severe AS.  (24 mmHg per cath)   Recommend Aspirin  81mg  daily for moderate CAD.       Todd Clay, MD  Assessment / Plan:  1. CAD - status post 2 vessel PCI with DES in November 2013. Now with progressive symptoms of SOB. High risk PET CT scan. Cardiac cath showed 3 vessel obstructive CAD progressed from 2012.  Now s/p CABG x 4 with excellent recovery.   2. HTN - controlled.   3. Post op Afib. Rhythm regular. On amiodarone . Anticipate being able to stop amiodarone  if rhythm stable post recovery  4. CKD stage 3a.  5. Hyperlipidemia. Intolerant of statins at higher doses. Continue with  dietary modification and weight loss. Continue pravastatin . Excellent response to addition of Zetia . Last LDL 71.   6. Carotid arterial disease with moderate 40-59% LICA stenosis, 40-59% RICA stenosis. Asymptomatic. Dopplers  this month unchanged.   7. Severe AS. Plan TAVR once recovered from CABG. To be directed by Dr Lucas.   I will follow up in 3 months

## 2024-05-12 NOTE — Progress Notes (Signed)
 [address] [place] [address]             [phone]       HPI:  Patient returns for routine postoperative follow-up having undergone  Coronary artery bypass grafting x 4, Left internal mammary artery graft to the LAD, SVG to diagonal, SVG to OM, SVG to distal RCA and endoscopic vein harvest from the right leg on 04/26/2024 with Dr. Lucas.  The patient's early postoperative recovery while in the hospital was notable for having an increase in his creatinine. He was routinely diuresed and his creatinine was closely monitored. He has a few episodes of bradycardia. He one episode of paroxysmal atrial fibrillation and was started on amiodarone .  He converted back to normal sinus rhythm. He was stable for discharge home on 05/02/2024.   Since hospital discharge the patient reports that he has been doing well.  He has been using tylenol  as needed for pain. He has been walking laps around his house for activity.  His incision sites have been healing appropriately.  He denies chest pain and shortness of breath.  He does have some slight lower leg edema which he attributes to not taking torsemide  today.  He had multiple office visits and will take this medication when he gets home.   Allergies as of 05/12/2024       Reactions   Prednisone Shortness Of Breath, Swelling   Can tolerate as an injection but not orally   Rosuvastatin  Other (See Comments)   myalgia   Crestor  [rosuvastatin  Calcium ] Other (See Comments)   myalgia   Gabapentin  Other (See Comments)   Lyrica  [pregabalin ]    swelling        Medication List        Accurate as of May 12, 2024  4:32 PM. If you have any questions, ask your nurse or doctor.          albuterol  108 (90 Base) MCG/ACT inhaler Commonly known as: VENTOLIN  HFA Inhale 2 puffs into the lungs every 6 (six) hours as needed for wheezing or shortness of breath.   amiodarone  200 MG tablet Commonly known as:  PACERONE  Take 1 tablet (200 mg total) by mouth 2 (two) times daily.   amLODipine  5 MG tablet Commonly known as: NORVASC  Take 1 tablet (5 mg total) by mouth daily.   aspirin  81 MG chewable tablet Chew 81 mg by mouth daily.   Azelastine  HCl 137 MCG/SPRAY Soln INSTILL 2 SPRAYS INTO BOTH NOSTRILS 2 TIMES DAILY AS DIRECTED What changed: See the new instructions.   cetirizine 10 MG tablet Commonly known as: ZYRTEC Take 10 mg by mouth daily.   esomeprazole  40 MG capsule Commonly known as: NEXIUM  TAKE 1 CAPSULE BY MOUTH DAILY BEFORE A MEAL   ezetimibe  10 MG tablet Commonly known as: ZETIA  TAKE 1 TABLET BY MOUTH EVERY DAY   fluticasone  50 MCG/ACT nasal spray Commonly known as: FLONASE  Place 2 sprays into both nostrils 2 (two) times daily. What changed:  when to take this reasons to take this   Integra Plus  Caps TAKE 1 CAPSULE BY MOUTH DAILY   LORazepam  0.5 MG tablet Commonly known as: Ativan  Take 0.5 mg daily as needed   Lumigan 0.01 % Soln Generic drug: bimatoprost Place 1 drop into both eyes at bedtime.   montelukast  10 MG tablet Commonly known as: SINGULAIR  TAKE 1 TABLET BY MOUTH EVERYDAY AT BEDTIME   multivitamin with minerals Tabs tablet  Take 1 tablet by mouth daily. Adult 50+   nitroGLYCERIN  0.4 MG SL tablet Commonly known as: Nitrostat  Place 1 tablet (0.4 mg total) under the tongue every 5 (five) minutes as needed for chest pain (up to 3 doses. not within 24hrs of Viagra ).   oxyCODONE  5 MG immediate release tablet Commonly known as: Oxy IR/ROXICODONE  Take 1 tablet (5 mg total) by mouth every 4 (four) hours as needed for severe pain (pain score 7-10).   potassium chloride  SA 20 MEQ tablet Commonly known as: KLOR-CON  M Take 1 tablet (20 mEq total) by mouth daily.   pravastatin  10 MG tablet Commonly known as: PRAVACHOL  TAKE 1 TABLET EVERY OTHER DAY, ALTERNATING WITH 2 TABLETS.   tamsulosin  0.4 MG Caps capsule Commonly known as: FLOMAX  Take 1 capsule  (0.4 mg total) by mouth daily after breakfast.   torsemide  20 MG tablet Commonly known as: DEMADEX  Take 1 tablet (20 mg total) by mouth daily.   VITAMIN B 12 PO Take 1 tablet by mouth daily.         ROS Review of Systems  Constitutional: Negative.  Negative for fever and malaise/fatigue.  Respiratory:  Negative for cough and shortness of breath.   Cardiovascular:  Positive for leg swelling. Negative for chest pain and palpitations.      BP (!) 154/80 (BP Location: Left Arm)   Pulse 90   Resp 18   Ht 5' 7 (1.702 m)   Wt 221 lb (100.2 kg)   SpO2 98%   BMI 34.61 kg/m   Physical Exam Constitutional:      Appearance: Normal appearance.  HENT:     Head: Normocephalic and atraumatic.  Cardiovascular:     Rate and Rhythm: Normal rate and regular rhythm.     Heart sounds: S1 normal and S2 normal. Murmur heard.     Systolic murmur is present with a grade of 2/6.  Pulmonary:     Effort: Pulmonary effort is normal.     Breath sounds: Normal breath sounds.  Musculoskeletal:     Right lower leg: 1+ Edema present.     Left lower leg: 1+ Edema present.  Skin:    General: Skin is warm and dry.      Neurological:     General: No focal deficit present.     Mental Status: He is alert and oriented to person, place, and time.       Imaging: CLINICAL DATA:  CABG.  Aortic valve stenosis.   EXAM: CHEST - 2 VIEW   COMPARISON:  04/21/2024   FINDINGS: Cardiomediastinal silhouette and pulmonary vasculature are within normal limits.   Postsurgical changes of CABG again seen.  Lungs are clear.   IMPRESSION: No acute cardiopulmonary process.     Electronically Signed   By: Aliene Lloyd M.D.   On: 05/12/2024 13:09     Assessment/Plan:  S/P CABG x 4 -We reviewed today's chest x ray.   -We discussed the continuation of sternal precautions until a full 6 weeks from surgery, continue to not lift over 10 pounds until a full 3 months from surgery -Continue to increase  activity/exercise as tolerated -Incision sites are healing appropriately.  Continue to keep them clean with soap and pat dry.  -He should continue to increase his activity/exercise as tolerated -He has been seen by cardiology and he is to with amlodipine , aspirin , zetia , torsemide , potassium, and pravastatin .  He plans to undergo a TAVR once he has recovered from CABG.  -Follow up with  [facility] in 3 weeks with chest x-ray. Will discuss driving and clearance for cardiac rehab.   Manuelita HERO [place], [name] 4:32 PM 05/12/24

## 2024-05-12 NOTE — Patient Instructions (Addendum)
 Medication Instructions:  Continue same medications *If you need a refill on your cardiac medications before your next appointment, please call your pharmacy*  Lab Work: Continue same medications   Testing/Procedures: None ordered  Follow-Up: At [facility], you and your health needs are our priority.  As part of our continuing mission to provide you with exceptional heart care, our providers are all part of one team.  This team includes your primary Cardiologist (physician) and Advanced Practice Providers or APPs (Physician Assistants and Nurse Practitioners) who all work together to provide you with the care you need, when you need it.  Your next appointment:  3 months     Tues 3/3 at 10:20 am    Provider:  Dr.Jordan  We recommend signing up for the patient portal called MyChart.  Sign up information is provided on this After Visit Summary.  MyChart is used to connect with patients for Virtual Visits (Telemedicine).  Patients are able to view lab/test results, encounter notes, upcoming appointments, etc.  Non-urgent messages can be sent to your provider as well.   To learn more about what you can do with MyChart, go to [email or web address].

## 2024-05-13 ENCOUNTER — Other Ambulatory Visit: Payer: Self-pay | Admitting: Cardiology

## 2024-05-13 ENCOUNTER — Telehealth: Payer: Self-pay | Admitting: Family Medicine

## 2024-05-13 ENCOUNTER — Telehealth: Admitting: *Deleted

## 2024-05-13 NOTE — Telephone Encounter (Signed)
 [facility] faxed Home Health Certificate (Order [place] [id number]), to be filled out by provider. Patient requested to send it back via Fax within ASAP. Document is located in providers tray at front office.Please advise at [phone].

## 2024-05-14 DIAGNOSIS — M109 Gout, unspecified: Secondary | ICD-10-CM | POA: Diagnosis not present

## 2024-05-14 DIAGNOSIS — N1831 Chronic kidney disease, stage 3a: Secondary | ICD-10-CM | POA: Diagnosis not present

## 2024-05-14 DIAGNOSIS — M1990 Unspecified osteoarthritis, unspecified site: Secondary | ICD-10-CM | POA: Diagnosis not present

## 2024-05-14 DIAGNOSIS — Z48812 Encounter for surgical aftercare following surgery on the circulatory system: Secondary | ICD-10-CM | POA: Diagnosis not present

## 2024-05-14 DIAGNOSIS — I2510 Atherosclerotic heart disease of native coronary artery without angina pectoris: Secondary | ICD-10-CM | POA: Diagnosis not present

## 2024-05-14 DIAGNOSIS — I480 Paroxysmal atrial fibrillation: Secondary | ICD-10-CM | POA: Diagnosis not present

## 2024-05-14 DIAGNOSIS — Z951 Presence of aortocoronary bypass graft: Secondary | ICD-10-CM | POA: Diagnosis not present

## 2024-05-14 DIAGNOSIS — K219 Gastro-esophageal reflux disease without esophagitis: Secondary | ICD-10-CM | POA: Diagnosis not present

## 2024-05-14 DIAGNOSIS — I350 Nonrheumatic aortic (valve) stenosis: Secondary | ICD-10-CM | POA: Diagnosis not present

## 2024-05-14 DIAGNOSIS — I129 Hypertensive chronic kidney disease with stage 1 through stage 4 chronic kidney disease, or unspecified chronic kidney disease: Secondary | ICD-10-CM | POA: Diagnosis not present

## 2024-05-14 DIAGNOSIS — E785 Hyperlipidemia, unspecified: Secondary | ICD-10-CM | POA: Diagnosis not present

## 2024-05-14 DIAGNOSIS — I6529 Occlusion and stenosis of unspecified carotid artery: Secondary | ICD-10-CM | POA: Diagnosis not present

## 2024-05-14 NOTE — Telephone Encounter (Signed)
Form placed to be reviewed in PCP office

## 2024-05-14 NOTE — Telephone Encounter (Signed)
 [facility] form faxed to [phone].  Form placed to be scan in paitent chart

## 2024-05-17 ENCOUNTER — Ambulatory Visit (INDEPENDENT_AMBULATORY_CARE_PROVIDER_SITE_OTHER): Admitting: Otolaryngology

## 2024-05-17 ENCOUNTER — Encounter (INDEPENDENT_AMBULATORY_CARE_PROVIDER_SITE_OTHER): Payer: Self-pay | Admitting: Otolaryngology

## 2024-05-17 VITALS — BP 132/77 | HR 95 | Ht 67.0 in | Wt 218.0 lb

## 2024-05-17 DIAGNOSIS — Z9622 Myringotomy tube(s) status: Secondary | ICD-10-CM

## 2024-05-17 DIAGNOSIS — H6121 Impacted cerumen, right ear: Secondary | ICD-10-CM

## 2024-05-17 DIAGNOSIS — H6501 Acute serous otitis media, right ear: Secondary | ICD-10-CM

## 2024-05-17 DIAGNOSIS — H903 Sensorineural hearing loss, bilateral: Secondary | ICD-10-CM

## 2024-05-17 DIAGNOSIS — H6991 Unspecified Eustachian tube disorder, right ear: Secondary | ICD-10-CM

## 2024-05-17 NOTE — Progress Notes (Unsigned)
 Dear Dr. Kennyth, Here is my assessment for our mutual patient, Todd Spears. Thank you for allowing me the opportunity to care for your patient. Please do not hesitate to contact me should you have any other questions. Sincerely, Dr. Eldora Blanch  Otolaryngology Clinic Note Referring provider: Dr. Kennyth HPI:  Todd Spears is a 78 y.o. male kindly referred by Dr. Kennyth for evaluation of ear fullness.   Initial visit (06/2023): Patient reports: right ear fullness and pain in December during a URI. Got two courses of augmentin , two shots of steroids, improved URI symptoms and pain. Now persistent right ear fullness.  He is able to hear some from left (very little), but lost hearing in that ear suddenly in his 63s (normal hearing before it) -- no antecedent event. He saw an ENT and audiology, and reported he had never damage. Does not know how it happened but does note it was sudden hearing loss. Does not wear hearing aid. He reports he cannot takes steroids as he swells up No general sinusitis or nasal trouble. Patient currently denies: ear pain, vertigo, drainage, tinnitus Patient additionally denies: deep pain in ear canal, eustachian tube symptoms such as popping/crackling, sensitive to pressure changes Patient also denies barotrauma, vestibular suppressant use, ototoxic medication use Prior ear surgery: no  No autoimmune symptoms. No recent hearing tests.  --------------------------------------------------------- 07/30/2023 Seen in follow up today. Unfortunately, he still continues to have right sided ear fullness, popping, and trouble hearing. He has been using flonase  and insufflating his ears, but no significant benefit. We discussed options again and he wishes to have a tube placed.  --------------------------------------------------------- 09/12/2023 Returns for recheck. He reports that he is doing much better after tymp tube. Hearing back to baseline. No fullness, popping,  drainage, vertigo. He has been using nasal sprays. He did have an audiogram today ---------------------------------------------------------- 05/17/2024 Returns for recheck. No issues with hearing or other issues from ear standpoint; no fullness, popping, drainage, vertigo.  He underwent CABG recently and is recovering from that.  Personal or FHx of bleeding dz or anesthesia difficulty: no   GLP-1: no  Tobacco: former (only smoked ~1 year). Lives in [place], [place]  PMHx: HTN, Aortic stenosis, CAD s/p PCI (2013) and CABG x4 (2025), Gout, OA, prior hyponatremia, KD Independent Review of Additional Tests or Records:  Dr. Kennyth (FM) 06/16/2023): Noted ear fullness, decreaesd hearing; improving ME effusion, c/f ETD; improved with steroid injection, now given another; Rpt course of augmentin ; Dx: ETD, ME effusion, Rx: ENT Dr. Kennyth 06/03/2023: Noted ME effusion with c/f ETD; recent URI; prior rxn to steroids; has HL; Rx: Augmentin ; f/u 2 weeks Dr. Lavern (FM) 05/21/2023: URI sx, throat drainage, sinus and ear sx including pain; Dx: Bronchitis, URI; Rx: Oral abx (Z pak), mucinex  09/2023 Audio reviewed: resolution of ABG on right with improvement in thresholds when compared to 07/2023 audio; WRT 84% at 75dB; AS with stable thresholds. Type A tymp AS, large vol AD    07/2023 Audio/Tymps interpreted independently: B AD; A AS 06/2023 Audiogram was independently reviewed and interpreted by me and it reveals AD: B tymp; AS: A tymp; Left - mild then profound mixed(?) HL -- essentially SNHL; CNT discrim score; Right: mild to severe mixed HL, but noted mixed dilemma; clear asymmetry; AD: 84% at 95dB WRT   SNHL= Sensorineural hearing loss  Audio [facility] ENT (Dr. Devora) 12/2007 and 11/03/2007 independently reviewed:quite similar compared to 2025 on left; on right, low freq declined but could be due to acute effusion  BMP 03/13/2023, CBC 01/28/2023 reviewed: WBC 7.8, Plt 281, Hgb 11.2, BMP essentially wnl,  Na borderline  MRI Brain 08/07/2021 independently reviewed with respect to ears: no obvious retrocochlear; mild paranasal disease disease b/l - right max; no NP lesions noted  [facility] 08/07/2021 independently reviewed: no significant mastoid or ME effusion; no otic capsule abnormalities; cuts thick  PMH/Meds/All/SocHx/FamHx/ROS:   Past Medical History:  Diagnosis Date   Allergic rhinitis    Anemia    Aortic stenosis    Asthma    CAD (coronary artery disease)    a. s/p PTCA/stenting of the mid LAD and mid RCA 04/06/12   Carotid bruit    r   Colon polyp    Diverticulosis    ED (erectile dysfunction)    Fatty liver    GERD (gastroesophageal reflux disease)    Gout    Hyperlipidemia    Hypertension    Obesity    Osteoarthritis    Renal insufficiency    Restless leg syndrome      Past Surgical History:  Procedure Laterality Date   AORTIC VALVE REPLACEMENT N/A 04/26/2024   Procedure: REPLACEMENT, AORTIC VALVE, OPEN;  Surgeon: Lucas Dorise POUR, MD;  Location: [facility] OR;  Service: Open Heart Surgery;  Laterality: N/A;   BACK SURGERY  2020   COLONOSCOPY  04/23/2016   Dr.Perry   CORONARY ARTERY BYPASS GRAFT N/A 04/26/2024   Procedure: CORONARY ARTERY BYPASS GRAFTING (CABG) TIMES 4 USING LEFT INTERNAL MAMMARY ARTERY AND ENDOSCOPICALLY HARVESTED RIGHT GREATER SAPHENOUS VEIN;  Surgeon: Lucas Dorise POUR, MD;  Location: [facility] OR;  Service: Open Heart Surgery;  Laterality: N/A;   INGUINAL HERNIA REPAIR Right 02/27/2016   Procedure: HERNIA REPAIR INGUINAL ADULT;  Surgeon: Donnice Bury, MD;  Location: [facility];  Service: General;  Laterality: Right;   INSERTION OF MESH Right 02/27/2016   Procedure: INSERTION OF MESH--IN AND OUT CATHETERIZATION PERFORMED AT END OF PROCEDURE TO DRAIN URINE.;  Surgeon: Donnice Bury, MD;  Location: [facility];  Service: General;  Laterality: Right;   INTRAOPERATIVE TRANSESOPHAGEAL ECHOCARDIOGRAM N/A 04/26/2024   Procedure:  ECHOCARDIOGRAM, TRANSESOPHAGEAL, INTRAOPERATIVE;  Surgeon: Lucas Dorise POUR, MD;  Location: [facility] OR;  Service: Open Heart Surgery;  Laterality: N/A;   LEFT HEART CATH AND CORONARY ANGIOGRAPHY N/A 03/19/2024   Procedure: LEFT HEART CATH AND CORONARY ANGIOGRAPHY;  Surgeon: Anner Alm ORN, MD;  Location: [facility] INVASIVE CV LAB;  Service: Cardiovascular;  Laterality: N/A;   LYMPH NODE BIOPSY     right side of neck 1999 - benign   PERCUTANEOUS CORONARY STENT INTERVENTION (PCI-S) N/A 04/06/2012   Procedure: PERCUTANEOUS CORONARY STENT INTERVENTION (PCI-S);  Surgeon: Peter M Jordan, MD; Promus DES for 90% mid LAD and 95% mid RCA disease     UPPER GASTROINTESTINAL ENDOSCOPY  04/23/2016   Dr.Perry    Family History  Problem Relation Age of Onset   Hypertension Mother    Stroke Mother        Several    Stomach cancer Mother    Glaucoma Mother    Coronary artery disease Father        in his 51s   Heart disease Father    Heart attack Father    Hypertension Paternal Grandmother    Hypertension Paternal Grandfather    Colon cancer Neg Hx    Rectal cancer Neg Hx      Social Connections: Socially Integrated (05/01/2024)   Social Connection and Isolation Panel    Frequency of Communication with Friends  and Family: Three times a week    Frequency of Social Gatherings with Friends and Family: Once a week    Attends Religious Services: More than 4 times per year    Active Member of [employer] or Organizations: Yes    Attends Engineer, Structural: More than 4 times per year    Marital Status: Married      Current Outpatient Medications:    albuterol  (VENTOLIN  HFA) 108 (90 Base) MCG/ACT inhaler, Inhale 2 puffs into the lungs every 6 (six) hours as needed for wheezing or shortness of breath., Disp: 8.5 each, Rfl: 3   amiodarone  (PACERONE ) 200 MG tablet, Take 1 tablet (200 mg total) by mouth 2 (two) times daily., Disp: 60 tablet, Rfl: 1   amLODipine  (NORVASC ) 5 MG tablet, Take 1 tablet (5 mg total)  by mouth daily., Disp: 30 tablet, Rfl: 1   aspirin  81 MG chewable tablet, Chew 81 mg by mouth daily., Disp: , Rfl:    Azelastine  HCl 137 MCG/SPRAY SOLN, INSTILL 2 SPRAYS INTO BOTH NOSTRILS 2 TIMES DAILY AS DIRECTED (Patient taking differently: Place 1 spray into both nostrils daily as needed (Congestion).), Disp: 90 mL, Rfl: 1   cetirizine (ZYRTEC) 10 MG tablet, Take 10 mg by mouth daily., Disp: , Rfl:    Cyanocobalamin  (VITAMIN B 12 PO), Take 1 tablet by mouth daily. , Disp: , Rfl:    esomeprazole  (NEXIUM ) 40 MG capsule, TAKE 1 CAPSULE BY MOUTH DAILY BEFORE A MEAL, Disp: 90 capsule, Rfl: 2   ezetimibe  (ZETIA ) 10 MG tablet, TAKE 1 TABLET BY MOUTH EVERY DAY, Disp: 90 tablet, Rfl: 3   FeFum-FePoly-FA-B Cmp-C-Biot (INTEGRA PLUS ) CAPS, TAKE 1 CAPSULE BY MOUTH DAILY, Disp: 90 capsule, Rfl: 3   fluticasone  (FLONASE ) 50 MCG/ACT nasal spray, Place 2 sprays into both nostrils 2 (two) times daily. (Patient taking differently: Place 2 sprays into both nostrils 2 (two) times daily as needed for rhinitis.), Disp: 16 g, Rfl: 6   LORazepam  (ATIVAN ) 0.5 MG tablet, Take 0.5 mg daily as needed, Disp: 30 tablet, Rfl: 3   LUMIGAN 0.01 % SOLN, Place 1 drop into both eyes at bedtime., Disp: , Rfl:    montelukast  (SINGULAIR ) 10 MG tablet, TAKE 1 TABLET BY MOUTH EVERYDAY AT BEDTIME, Disp: 90 tablet, Rfl: 3   Multiple Vitamin (MULTIVITAMIN WITH MINERALS) TABS tablet, Take 1 tablet by mouth daily. Adult 50+, Disp: , Rfl:    nitroGLYCERIN  (NITROSTAT ) 0.4 MG SL tablet, Place 1 tablet (0.4 mg total) under the tongue every 5 (five) minutes as needed for chest pain (up to 3 doses. not within 24hrs of Viagra )., Disp: 25 tablet, Rfl: 6   oxyCODONE  (OXY IR/ROXICODONE ) 5 MG immediate release tablet, Take 1 tablet (5 mg total) by mouth every 4 (four) hours as needed for severe pain (pain score 7-10)., Disp: 30 tablet, Rfl: 0   potassium chloride  SA (KLOR-CON  M) 20 MEQ tablet, Take 1 tablet (20 mEq total) by mouth daily., Disp: 90  tablet, Rfl: 3   pravastatin  (PRAVACHOL ) 10 MG tablet, TAKE 1 TABLET EVERY OTHER DAY, ALTERNATING WITH 2 TABLETS., Disp: 135 tablet, Rfl: 1   tamsulosin  (FLOMAX ) 0.4 MG CAPS capsule, Take 1 capsule (0.4 mg total) by mouth daily after breakfast., Disp: 30 capsule, Rfl: 3   torsemide  (DEMADEX ) 20 MG tablet, Take 1 tablet (20 mg total) by mouth daily., Disp: 30 tablet, Rfl: 3   Physical Exam:   BP 132/77 (BP Location: Right Arm, Patient Position: Sitting, Cuff Size: Large)  Pulse 95   Ht 5' 7 (1.702 m)   Wt 218 lb (98.9 kg)   SpO2 97%   BMI 34.14 kg/m   Salient findings:  CN II-XII intact Given history and complaints, ear microscopy was indicated and performed for evaluation with findings as below in physical exam section and in procedures. Right ear cerumen impaction, cleared; on left, TM intact with well pneumatized middle ear space on left; on right, PE tube is dry and patent and in place; no evidence of infection Anterior rhinoscopy: Septum intact; bilateral inferior turbinates without significant hypertrophy  Seprately Identifiable Procedures:  Procedure: Bilateral ear microscopy and cerumen removal using microscope (CPT [id number]) - Mod 25 Pre-procedure diagnosis: Cerumen impaction right external ears, history of right tympanostomy tube placement Post-procedure diagnosis: same Indication: Right cerumen impaction; given patient's otologic complaints and history as well as for improved and comprehensive examination of external ear and tympanic membrane, bilateral otologic examination using microscope was performed and impacted cerumen removed Prior to proceeding, R/B/A were discussed and verbal consent was obtained for procdure  Procedure: Patient was placed semi-recumbent. Both ear canals were examined using the microscope with findings above. Impacted Cerumen removed on on right using currette with improvement in EAC examination and patency. Findings as above. Patient tolerated the  procedure well.       Prior nasal endo did not show any masses over ET  Impression & Plans:  Todd Spears is a 78 y.o. male with:  1. Sensorineural hearing loss, bilateral   2. Asymmetrical sensorineural hearing loss   3. Dysfunction of right eustachian tube   4. Non-recurrent acute serous otitis media of right ear   5. S/P tympanostomy tube placement   6. Impacted cerumen of right ear    ---  Sudden hearing loss left - at least since 2009  Noted hearing loss and ETD symptoms after URI which did not improve with medical management. No masses over eustachian tube. Tube placed on right with improvement in symptoms  - Can stop nasal sprays now - Ofloxacin  drops now PRN should he have issue with right ear - Return precautions discussed; if any issues with right ear, advised to call us  ASAP - Consider amplification - f/u 1 year    Thank you for allowing me the opportunity to care for your patient. Please do not hesitate to contact me should you have any other questions.  Sincerely, Eldora Blanch, MD Otolaryngologist (ENT), [facility] Phone: [phone] Fax: [phone]  05/21/2024, 1:12 PM   MDM:  [phone] Complexity/Problems addressed: mod - multiple chronic problems Data complexity: low - Morbidity: low - Prescription Drug prescribed or managed: no

## 2024-05-18 ENCOUNTER — Other Ambulatory Visit: Payer: Self-pay | Admitting: *Deleted

## 2024-05-18 NOTE — Patient Instructions (Signed)
 Visit Information  Thank you for taking time to visit with me today. Please don't hesitate to contact me if I can be of assistance to you before our next scheduled telephone appointment.  Our next appointment is by telephone on 06/01/24 @ 915 am  Following is a copy of your care plan:   Goals Addressed             This Visit's Progress    VBCI Transitions of Care (TOC) Care Plan       Problems:  Recent Hospitalization for treatment of CAD and CABG x 4 Knowledge Deficit Related to CAD 05/18/24 spoke with pt, no new concerns reported, saw surgeon and cardiologist on 05/12/24, had CXR, will follow up with surgeon on 12/31 and cardiologist in 3 months,  PT is working with pt 2 x per week  Goal:  Over the next 30 days, the patient will not experience hospital readmission  Interventions:  CAD Interventions: Assessed understanding of CAD diagnosis Medications reviewed including medications utilized in CAD treatment plan Provided education on Importance of limiting foods high in cholesterol Reviewed Importance of taking all medications as prescribed Reviewed Importance of attending all scheduled provider appointments Pain assessment completed Reinforced sternal precautions Reinforced signs /symptoms of infection Reviewed weight, BP, HR log, encouraged pt to continue keeping a log and take to provider appointments  Patient Self Care Activities:  Attend all scheduled provider appointments Call pharmacy for medication refills 3-7 days in advance of running out of medications Call provider office for new concerns or questions  Notify RN Care Manager of TOC call rescheduling needs Participate in Transition of Care Program/Attend TOC scheduled calls Perform all self care activities independently  Take medications as prescribed   Monitor for fluid weight gain, continue daily weights Follow up with outpatient rehab  Plan:  Follow up with provider re: any edema, shortness of breath or  weight gain, signs of infection Next PCP appointment scheduled for: Patient will call and schedule follow up appointment Telephone follow up appointment with care management team member scheduled for:  06/01/24 @ 915 am        Care plan and visit instructions communicated with the patient verbally today. Patient agrees to receive a copy in MyChart. Active MyChart status and patient understanding of how to access instructions and care plan via MyChart confirmed with patient.     Telephone follow up appointment with care management team member scheduled for: 06/01/24 @ 915 am  Please call the care guide team at [phone] if you need to cancel or reschedule your appointment.   Please call the Suicide and Crisis Lifeline: 988 call the [place]  National Suicide Prevention Lifeline: 620-388-7905 or TTY: 825-776-7134 TTY 825-083-1396) to talk to a trained counselor call 1-800-273-TALK (toll free, 24 hour hotline) go to [facility] [address], [place] [phone]) call 911 if you are experiencing a Mental Health or Behavioral Health Crisis or need someone to talk to.  Mliss Creed [facility], BSN RN Care Manager/ Transition of Care [place]/ [facility] [phone]

## 2024-05-18 NOTE — Patient Outreach (Signed)
 Transition of Care week 3  Visit Note  05/18/2024  Name: Todd Spears MRN: [id number]          DOB: 1945-06-05  Situation: Patient enrolled in [facility] 30-day program. Visit completed with patient by telephone.   Background:  Discharge Date and Diagnosis: 05/02/24, Coronary artery disease   Past Medical History:  Diagnosis Date   Allergic rhinitis    Anemia    Aortic stenosis    Asthma    CAD (coronary artery disease)    a. s/p PTCA/stenting of the mid LAD and mid RCA 04/06/12   Carotid bruit    r   Colon polyp    Diverticulosis    ED (erectile dysfunction)    Fatty liver    GERD (gastroesophageal reflux disease)    Gout    Hyperlipidemia    Hypertension    Obesity    Osteoarthritis    Renal insufficiency    Restless leg syndrome     Assessment: Patient Reported Symptoms: Cognitive Cognitive Status: Alert and oriented to person, place, and time, No symptoms reported, Able to follow simple commands, Normal speech and language skills      Neurological Neurological Review of Symptoms: No symptoms reported    HEENT HEENT Symptoms Reported: No symptoms reported      Cardiovascular Cardiovascular Symptoms Reported: No symptoms reported Does patient have uncontrolled Hypertension?: No Cardiovascular Management Strategies: Adequate rest, Routine screening Cardiovascular Self-Management Outcome: 4 (good) Cardiovascular Comment: reinforced sternal precautions  Respiratory      Endocrine Endocrine Symptoms Reported: No symptoms reported Is patient diabetic?: No    Gastrointestinal Gastrointestinal Symptoms Reported: No symptoms reported      Genitourinary Genitourinary Symptoms Reported: No symptoms reported    Integumentary Integumentary Symptoms Reported: Incision Additional Integumentary Details: pt followed up with surgeon on 12/10, states got good report, had CXR Skin Management Strategies: Routine screening, Adequate rest Skin Self-Management Outcome: 4  (good) Skin Comment: reinforced signs /symptoms infection  Musculoskeletal Musculoskelatal Symptoms Reviewed: No symptoms reported        Psychosocial Psychosocial Symptoms Reported: No symptoms reported         There were no vitals filed for this visit.    Medications Reviewed Today     Reviewed by Aura Mliss DELENA, RN (Registered Nurse) on 05/18/24 at 1041  Med List Status: <None>   Medication Order Taking? Sig Documenting Provider Last Dose Status Informant  albuterol  (VENTOLIN  HFA) 108 (90 Base) MCG/ACT inhaler [id number] Yes Inhale 2 puffs into the lungs every 6 (six) hours as needed for wheezing or shortness of breath. Kennyth Worth HERO, MD  Active Self  amiodarone  (PACERONE ) 200 MG tablet [id number] Yes Take 1 tablet (200 mg total) by mouth 2 (two) times daily. Barrett, Rocky SAUNDERS, [name]  Active   amLODipine  (NORVASC ) 5 MG tablet [id number] Yes Take 1 tablet (5 mg total) by mouth daily. Barrett, Rocky SAUNDERS RIGGERS  Active   aspirin  81 MG chewable tablet [id number] Yes Chew 81 mg by mouth daily. [provider]  Active Self  Azelastine  HCl 137 MCG/SPRAY SOLN [id number] Yes INSTILL 2 SPRAYS INTO BOTH NOSTRILS 2 TIMES DAILY AS DIRECTED  Patient taking differently: Place 1 spray into both nostrils daily as needed (Congestion).   Kennyth Worth HERO, MD  Active Self  cetirizine (ZYRTEC) 10 MG tablet [id number] Yes Take 10 mg by mouth daily. [provider]  Active Self  Cyanocobalamin  (VITAMIN B 12 PO) [id number] Yes Take 1 tablet by mouth daily.  [provider]  Active Self  esomeprazole  (NEXIUM ) 40 MG capsule [id number] Yes TAKE 1 CAPSULE BY MOUTH DAILY BEFORE A MEAL Kennyth Worth HERO, MD  Active Self  ezetimibe  (ZETIA ) 10 MG tablet [id number] Yes TAKE 1 TABLET BY MOUTH EVERY DAY Jordan, Peter M, MD  Active   FeFum-FePoly-FA-B Cmp-C-Biot (INTEGRA PLUS ) CAPS [id number] Yes TAKE 1 CAPSULE BY MOUTH DAILY Kennyth Worth HERO, MD  Active Self  fluticasone  (FLONASE ) 50 MCG/ACT nasal spray  [id number] Yes Place 2 sprays into both nostrils 2 (two) times daily.  Patient taking differently: Place 2 sprays into both nostrils 2 (two) times daily as needed for rhinitis.   Tobie Eldora NOVAK, MD  Active Self  LORazepam  (ATIVAN ) 0.5 MG tablet [id number] Yes Take 0.5 mg daily as needed Jordan, Peter M, MD  Active Self  LUMIGAN 0.01 % SOLN [id number] Yes Place 1 drop into both eyes at bedtime. [provider]  Active Self  montelukast  (SINGULAIR ) 10 MG tablet [id number] Yes TAKE 1 TABLET BY MOUTH EVERYDAY AT BEDTIME Kennyth Worth HERO, MD  Active Self  Multiple Vitamin (MULTIVITAMIN WITH MINERALS) TABS tablet [id number] Yes Take 1 tablet by mouth daily. Adult 50+ [provider]  Active Self  nitroGLYCERIN  (NITROSTAT ) 0.4 MG SL tablet [id number] Yes Place 1 tablet (0.4 mg total) under the tongue every 5 (five) minutes as needed for chest pain (up to 3 doses. not within 24hrs of Viagra ). Kennyth Worth HERO, MD  Active Self           Med Note NANI, JOHNETTA LITTIE Kitchens Apr 26, 2024  6:22 AM) Patient states he has never taken nitro  oxyCODONE  (OXY IR/ROXICODONE ) 5 MG immediate release tablet [id number] Yes Take 1 tablet (5 mg total) by mouth every 4 (four) hours as needed for severe pain (pain score 7-10). Barrett, Rocky SAUNDERS, [name]  Active   potassium chloride  SA (KLOR-CON  M) 20 MEQ tablet [id number] Yes Take 1 tablet (20 mEq total) by mouth daily. Jordan, Peter M, MD  Active Self  pravastatin  (PRAVACHOL ) 10 MG tablet [id number] Yes TAKE 1 TABLET EVERY OTHER DAY, ALTERNATING WITH 2 TABLETS. Kennyth Worth HERO, MD  Active Self  tamsulosin  (FLOMAX ) 0.4 MG CAPS capsule [id number] Yes Take 1 capsule (0.4 mg total) by mouth daily after breakfast. Barrett, Erin R, [name]  Active   torsemide  (DEMADEX ) 20 MG tablet [id number] Yes Take 1 tablet (20 mg total) by mouth daily. Barrett, Rocky SAUNDERS, [name]  Active             Goals Addressed             This Visit's Progress    VBCI Transitions of Care (TOC) Care Plan        Problems:  Recent Hospitalization for treatment of CAD and CABG x 4 Knowledge Deficit Related to CAD 05/18/24 spoke with pt, no new concerns reported, saw surgeon and cardiologist on 05/12/24, had CXR, will follow up with surgeon on 12/31 and cardiologist in 3 months,  PT is working with pt 2 x per week  Goal:  Over the next 30 days, the patient will not experience hospital readmission  Interventions:  CAD Interventions: Assessed understanding of CAD diagnosis Medications reviewed including medications utilized in CAD treatment plan Provided education on Importance of limiting foods high in cholesterol Reviewed Importance of taking all medications as prescribed Reviewed Importance of attending all scheduled provider appointments Pain assessment completed Reinforced sternal precautions Reinforced signs /symptoms of infection Reviewed weight, BP, HR log, encouraged  pt to continue keeping a log and take to provider appointments  Patient Self Care Activities:  Attend all scheduled provider appointments Call pharmacy for medication refills 3-7 days in advance of running out of medications Call provider office for new concerns or questions  Notify RN Care Manager of TOC call rescheduling needs Participate in Transition of Care Program/Attend TOC scheduled calls Perform all self care activities independently  Take medications as prescribed   Monitor for fluid weight gain, continue daily weights Follow up with outpatient rehab  Plan:  Follow up with provider re: any edema, shortness of breath or weight gain, signs of infection Next PCP appointment scheduled for: Patient will call and schedule follow up appointment Telephone follow up appointment with care management team member scheduled for:  06/01/24 @ 915 am         Recommendation:   PCP Follow-up  Follow Up Plan:   Telephone follow-up 06/01/24 @ 915 am, will not outreach week of 12/22 (Christmas holiday) will outreach pt  week of 12/29, pt verbalizes understanding, will contact RN CM for any questions, concerns  Mliss Creed [facility], BSN RN Care Manager/ Transition of Care [place]/ [facility] [phone]

## 2024-05-20 ENCOUNTER — Ambulatory Visit: Admitting: Family Medicine

## 2024-05-20 VITALS — BP 122/66 | HR 81 | Temp 97.9°F | Resp 16 | Ht 67.0 in | Wt 218.4 lb

## 2024-05-20 DIAGNOSIS — N400 Enlarged prostate without lower urinary tract symptoms: Secondary | ICD-10-CM | POA: Insufficient documentation

## 2024-05-20 DIAGNOSIS — I10 Essential (primary) hypertension: Secondary | ICD-10-CM

## 2024-05-20 DIAGNOSIS — I2510 Atherosclerotic heart disease of native coronary artery without angina pectoris: Secondary | ICD-10-CM | POA: Diagnosis not present

## 2024-05-20 DIAGNOSIS — R739 Hyperglycemia, unspecified: Secondary | ICD-10-CM | POA: Diagnosis not present

## 2024-05-20 NOTE — Progress Notes (Signed)
 Todd Spears is a 78 y.o. male who presents today for an office visit.  Assessment/Plan:  Chronic Problems Addressed Today: CAD s/p CABG x4 2025 Patient is doing well status post CABG.  He is about 3 to 4 weeks postop now.  Pain is very well-controlled.  He has seen cardiology and cardiothoracic surgery.  He will be seeing them again in a couple weeks.  He has been consistent with working with home rehab.  Pain has been well-controlled.  Is not having any significant pain or shortness of breath.  Will defer further management to cardiology though he will let us  know if he needs any other assistance.  Essential hypertension Patient has several of his blood pressure medications adjusted while hospitalized.  He is now currently on only amlodipine  10 mg daily.  Tolerating this well.  Blood pressure is well-controlled today.  Hyperglycemia A1c proved to 5.5 during recent hospitalization.  Congratulated patient on A1c reduction.  We can recheck in about 6 months at his CPE.  BPH (benign prostatic hyperplasia) Patient was started on Flomax  during recent hospitalization.  He has been tolerating well.  He is not having any significant issues with urinary retention prior to his hospitalization.  Not clear if he will need this is a long-term medication however we will continue for now although would consider discontinuing at some point in the near future.     Subjective:  HPI:  See assessment / plan for status of chronic conditions.  Patient is here today for hospitalization follow-up.  He was admitted from 04/26/2024 through 05/02/2024 for CABG x 4.  He did well postoperatively.  Did have some mild issues with urinary retention that was treated with Flomax . Discussed the use of AI scribe software for clinical note transcription with the patient, who gave verbal consent to proceed.  History of Present Illness CLEMONS Spears is a 78 year old male who presents for follow-up after hospitalization  for CABG times four. He is accompanied by his wife, Romero.  He was hospitalized from April 26, 2024, to May 02, 2024, for a quadruple coronary artery bypass graft (CABG). Postoperatively, he experienced mild urinary retention, which was managed with tamsulosin . He feels great and has been engaging in physical therapy twice a week, focusing on exercises such as walking to strengthen his body. No shortness of breath and he can breathe comfortably while lying on his back. No significant pain, only mild tenderness around the surgical site, which is manageable.  He has been on tamsulosin  since his surgery to aid with urinary flow, as he experienced urinary retention postoperatively. He urinates fine now and had no urinary issues prior to the surgery.  His medication regimen has been adjusted post-surgery. He is currently taking amlodipine  for blood pressure management, having been taken off other blood pressure medications such as hydralazine  and metoprolol . He also experienced an episode of atrial fibrillation (AFib) while in the hospital, for which he is on amiodarone .  He has made dietary changes, including a salt-free diet, and has been monitoring his weight and blood pressure since returning home. His blood pressure readings have been stable, and he has lost a significant amount of fluid weight, which was initially gained during hospitalization. His A1c levels have improved, indicating better blood sugar control.  He is currently sleeping in a help chair instead of his bed to avoid using his elbows, which are still healing. He plans to return to his bed once he is more comfortable. He uses  a spirometer three to four times a day to maintain lung function.         Objective:  Physical Exam: BP 122/66   Pulse 81   Temp 97.9 F (36.6 C) (Temporal)   Resp 16   Ht 5' 7 (1.702 m)   Wt 218 lb 6.4 oz (99.1 kg)   SpO2 98%   BMI 34.21 kg/m   Wt Readings from Last 3 Encounters:  05/20/24  218 lb 6.4 oz (99.1 kg)  05/17/24 218 lb (98.9 kg)  05/12/24 221 lb (100.2 kg)    Gen: No acute distress, resting comfortably CV: Regular rate and rhythm with no murmurs appreciated Pulm: Normal work of breathing, clear to auscultation bilaterally with no crackles, wheezes, or rhonchi Skin: Surgical scars healing without complication Neuro: Grossly normal, moves all extremities Psych: Normal affect and thought content  Time Spent: 45 minutes of total time was spent on the date of the encounter performing the following actions: chart review prior to seeing the patient including recent hospitalization, obtaining history, performing a medically necessary exam, counseling on the treatment plan, placing orders, and documenting in our EHR.        Worth HERO. Kennyth, MD 05/20/2024 1:34 PM

## 2024-05-20 NOTE — Assessment & Plan Note (Signed)
 A1c proved to 5.5 during recent hospitalization.  Congratulated patient on A1c reduction.  We can recheck in about 6 months at his CPE.

## 2024-05-20 NOTE — Patient Instructions (Signed)
 It was very nice to see you today!  VISIT SUMMARY: You had a follow-up visit after your recent quadruple coronary artery bypass graft (CABG) surgery. You are recovering well, engaging in physical therapy, and managing your medications effectively. Your blood pressure, heart rate, and blood sugar levels are stable.  YOUR PLAN: CORONARY ARTERY DISEASE, STATUS POST CORONARY ARTERY BYPASS GRAFTING: You are recovering well after your CABG surgery, with no significant pain or complications. -Continue your physical therapy and exercise regimen. -You will start cardiopulmonary rehab once you get clearance. -Continue follow-up appointments with your cardiologist and surgical team.  ATRIAL FIBRILLATION: Your heart rate is stable with the medication amiodarone . -Continue taking amiodarone  as prescribed.  ESSENTIAL HYPERTENSION: Your blood pressure is well-controlled with amlodipine . -Continue taking amlodipine  10 mg daily.  BENIGN PROSTATIC HYPERPLASIA WITH URINARY RETENTION: Your urination is normal with the medication tamsulosin . -Continue taking tamsulosin  as needed.  HYPERGLYCEMIA: Your blood sugar levels have improved with dietary changes. -Continue with your dietary modifications. -Monitor your blood glucose levels regularly.  Return in about 6 months (around 11/18/2024) for Annual Physical.   Take care, Dr Kennyth  PLEASE NOTE:  If you had any lab tests, please let us  know if you have not heard back within a few days. You may see your results on mychart before we have a chance to review them but we will give you a call once they are reviewed by us .   If we ordered any referrals today, please let us  know if you have not heard from their office within the next week.   If you had any urgent prescriptions sent in today, please check with the pharmacy within an hour of our visit to make sure the prescription was transmitted appropriately.   Please try these tips to maintain a healthy  lifestyle:  Eat at least 3 REAL meals and 1-2 snacks per day.  Aim for no more than 5 hours between eating.  If you eat breakfast, please do so within one hour of getting up.   Each meal should contain half fruits/vegetables, one quarter protein, and one quarter carbs (no bigger than a computer mouse)  Cut down on sweet beverages. This includes juice, soda, and sweet tea.   Drink at least 1 glass of water with each meal and aim for at least 8 glasses per day  Exercise at least 150 minutes every week.

## 2024-05-20 NOTE — Assessment & Plan Note (Signed)
 Patient is doing well status post CABG.  He is about 3 to 4 weeks postop now.  Pain is very well-controlled.  He has seen cardiology and cardiothoracic surgery.  He will be seeing them again in a couple weeks.  He has been consistent with working with home rehab.  Pain has been well-controlled.  Is not having any significant pain or shortness of breath.  Will defer further management to cardiology though he will let us  know if he needs any other assistance.

## 2024-05-20 NOTE — Assessment & Plan Note (Signed)
 Patient has several of his blood pressure medications adjusted while hospitalized.  He is now currently on only amlodipine  10 mg daily.  Tolerating this well.  Blood pressure is well-controlled today.

## 2024-05-20 NOTE — Assessment & Plan Note (Signed)
 Patient was started on Flomax  during recent hospitalization.  He has been tolerating well.  He is not having any significant issues with urinary retention prior to his hospitalization.  Not clear if he will need this is a long-term medication however we will continue for now although would consider discontinuing at some point in the near future.

## 2024-05-21 ENCOUNTER — Other Ambulatory Visit: Payer: Self-pay | Admitting: Surgery

## 2024-05-21 DIAGNOSIS — Z951 Presence of aortocoronary bypass graft: Secondary | ICD-10-CM

## 2024-05-23 ENCOUNTER — Other Ambulatory Visit: Payer: Self-pay | Admitting: Family Medicine

## 2024-05-24 ENCOUNTER — Other Ambulatory Visit: Payer: Self-pay | Admitting: Physician Assistant

## 2024-05-27 ENCOUNTER — Other Ambulatory Visit: Payer: Self-pay | Admitting: Family Medicine

## 2024-06-01 ENCOUNTER — Other Ambulatory Visit: Payer: Self-pay | Admitting: *Deleted

## 2024-06-01 NOTE — Patient Instructions (Signed)
 Visit Information  Thank you for taking time to visit with me today. Please don't hesitate to contact me if I can be of assistance to you before our next scheduled telephone appointment.  Our next appointment is no further scheduled appointments.    Following is a copy of your care plan:   Goals Addressed             This Visit's Progress    COMPLETED: VBCI Transitions of Care (TOC) Care Plan       Problems:  Recent Hospitalization for treatment of CAD and CABG x 4 Knowledge Deficit Related to CAD 05/18/24 spoke with Todd Spears, no new concerns reported, saw surgeon and cardiologist on 05/12/24, had CXR, will follow up with surgeon on 12/31 and cardiologist in 3 months,  Todd Spears is working with Todd Spears 2 x per week 06/01/24- spoke with Todd Spears, no new concerns reported, will see surgeon 12/31 and have CXR, home health Todd Spears discharged Todd Spears yesterday, Todd Spears will discuss outpatient rehab with Todd Spears  Goal:  Over the next 30 days, the patient will not experience hospital readmission  Interventions:  CAD Interventions: Assessed understanding of CAD diagnosis Medications reviewed including medications utilized in CAD treatment plan Provided education on Importance of limiting foods high in cholesterol Reviewed Importance of taking all medications as prescribed Reviewed Importance of attending all scheduled provider appointments Pain assessment completed Reviewed sternal precautions Reviewed signs /symptoms of infection Reviewed weight log, encouraged Todd Spears to continue keeping a log and take to provider appointments Reviewed plan of care with Todd Spears including case closure  Patient Self Care Activities:  Attend all scheduled provider appointments Call pharmacy for medication refills 3-7 days in advance of running out of medications Call provider office for new concerns or questions  Notify RN Care Manager of TOC call rescheduling needs Participate in Transition of Care Program/Attend TOC scheduled calls Perform all self  care activities independently  Take medications as prescribed   Monitor for fluid weight gain, continue daily weights Follow up with outpatient rehab  Plan:  [facility] case closure        Care plan and visit instructions communicated with the patient verbally today. Patient agrees to receive a copy in MyChart. Active MyChart status and patient understanding of how to access instructions and care plan via MyChart confirmed with patient.     No further follow up required: case closure  Please call the care guide team at [phone] if you need to cancel or reschedule your appointment.   Please call the Suicide and Crisis Lifeline: 988 call the [place]  National Suicide Prevention Lifeline: (520) 617-1661 or TTY: 318-045-8792 TTY 413-618-3855) to talk to a trained counselor call 1-800-273-TALK (toll free, 24 hour hotline) go to [facility] [address], [place] [phone]) call 911 if you are experiencing a Mental Health or Behavioral Health Crisis or need someone to talk to.  Mliss Creed [facility], BSN RN Care Manager/ Transition of Care [place]/ [facility] [phone]

## 2024-06-01 NOTE — Patient Outreach (Signed)
 " Transition of Care week 4  Visit Note  06/01/2024  Name: Todd Spears MRN: [id number]          DOB: May 12, 1946  Situation: Patient enrolled in [facility] 30-day program. Visit completed with patient by telephone.   Background:  Discharge Date and Diagnosis: 05/02/24, Coronary artery disease   Past Medical History:  Diagnosis Date   Allergic rhinitis    Anemia    Aortic stenosis    Asthma    CAD (coronary artery disease)    a. s/p PTCA/stenting of the mid LAD and mid RCA 04/06/12   Carotid bruit    r   Colon polyp    Diverticulosis    ED (erectile dysfunction)    Fatty liver    GERD (gastroesophageal reflux disease)    Gout    Hyperlipidemia    Hypertension    Obesity    Osteoarthritis    Renal insufficiency    Restless leg syndrome     Assessment: Patient Reported Symptoms: Cognitive Cognitive Status: No symptoms reported, Alert and oriented to person, place, and time, Able to follow simple commands      Neurological Neurological Review of Symptoms: No symptoms reported    HEENT HEENT Symptoms Reported: No symptoms reported      Cardiovascular Cardiovascular Symptoms Reported: No symptoms reported Weight: 217 lb 12.8 oz (98.8 kg)  Respiratory Respiratory Symptoms Reported: No symptoms reported    Endocrine Endocrine Symptoms Reported: No symptoms reported Is patient diabetic?: No    Gastrointestinal Gastrointestinal Symptoms Reported: No symptoms reported      Genitourinary Genitourinary Symptoms Reported: No symptoms reported    Integumentary Integumentary Symptoms Reported: Incision Additional Integumentary Details: pt to follow up with surgeon 12/31 and have CXR, pt states incision looks good Skin Management Strategies: Routine screening, Adequate rest Skin Self-Management Outcome: 4 (good) Skin Comment: reviewed signs/ symptoms of infection  Musculoskeletal Musculoskelatal Symptoms Reviewed: No symptoms reported        Psychosocial Psychosocial  Symptoms Reported: No symptoms reported         Today's Vitals   06/01/24 0936  Weight: 217 lb 12.8 oz (98.8 kg)   Pain Scale: 0-10 Pain Score: 0-No pain  Medications Reviewed Today     Reviewed by Aura Mliss DELENA, RN (Registered Nurse) on 06/01/24 at [phone]  Med List Status: <None>   Medication Order Taking? Sig Documenting Provider Last Dose Status Informant  albuterol  (VENTOLIN  HFA) 108 (90 Base) MCG/ACT inhaler [id number]  TAKE 2 PUFFS BY MOUTH EVERY 6 HOURS AS NEEDED FOR WHEEZE OR SHORTNESS OF BREATH Kennyth Worth HERO, MD  Active   amiodarone  (PACERONE ) 200 MG tablet [id number]  Take 1 tablet (200 mg total) by mouth 2 (two) times daily. Barrett, Rocky SAUNDERS, [name]  Active   amLODipine  (NORVASC ) 5 MG tablet [id number]  Take 1 tablet (5 mg total) by mouth daily. Barrett, Rocky SAUNDERS RIGGERS  Active   aspirin  81 MG chewable tablet [id number]  Chew 81 mg by mouth daily. [provider]  Active Self  Azelastine  HCl 137 MCG/SPRAY SOLN [id number]  INSTILL 2 SPRAYS INTO BOTH NOSTRILS 2 TIMES DAILY AS DIRECTED  Patient taking differently: Place 1 spray into both nostrils daily as needed (Congestion).   Kennyth Worth HERO, MD  Active Self  cetirizine (ZYRTEC) 10 MG tablet [id number]  Take 10 mg by mouth daily. [provider]  Active Self  Cyanocobalamin  (VITAMIN B 12 PO) [id number]  Take 1 tablet by mouth daily.  [provider]  Active Self  esomeprazole  (NEXIUM ) 40 MG capsule [id number]  TAKE 1 CAPSULE BY MOUTH DAILY BEFORE A MEAL Kennyth Worth HERO, MD  Active Self  ezetimibe  (ZETIA ) 10 MG tablet [id number]  TAKE 1 TABLET BY MOUTH EVERY DAY Jordan, Peter M, MD  Active   FeFum-FePoly-FA-B Cmp-C-Biot (INTEGRA PLUS ) CAPS [id number]  TAKE 1 CAPSULE BY MOUTH DAILY Kennyth Worth HERO, MD  Active Self  fluticasone  (FLONASE ) 50 MCG/ACT nasal spray [id number]  Place 2 sprays into both nostrils 2 (two) times daily.  Patient taking differently: Place 2 sprays into both nostrils 2 (two) times daily as needed  for rhinitis.   Tobie Eldora NOVAK, MD  Active Self  LORazepam  (ATIVAN ) 0.5 MG tablet [id number]  Take 0.5 mg daily as needed Jordan, Peter M, MD  Active Self  LUMIGAN 0.01 % SOLN [id number]  Place 1 drop into both eyes at bedtime. [provider]  Active Self  montelukast  (SINGULAIR ) 10 MG tablet [id number]  TAKE 1 TABLET BY MOUTH EVERYDAY AT BEDTIME Kennyth Worth HERO, MD  Active Self  Multiple Vitamin (MULTIVITAMIN WITH MINERALS) TABS tablet [id number]  Take 1 tablet by mouth daily. Adult 50+ [provider]  Active Self  nitroGLYCERIN  (NITROSTAT ) 0.4 MG SL tablet [id number]  Place 1 tablet (0.4 mg total) under the tongue every 5 (five) minutes as needed for chest pain (up to 3 doses. not within 24hrs of Viagra ). Kennyth Worth HERO, MD  Active Self           Med Note NANI, JOHNETTA LITTIE Kitchens Apr 26, 2024  6:22 AM) Patient states he has never taken nitro  oxyCODONE  (OXY IR/ROXICODONE ) 5 MG immediate release tablet [id number]  Take 1 tablet (5 mg total) by mouth every 4 (four) hours as needed for severe pain (pain score 7-10). Barrett, Rocky SAUNDERS, [name]  Active   potassium chloride  SA (KLOR-CON  M) 20 MEQ tablet [id number]  Take 1 tablet (20 mEq total) by mouth daily. Jordan, Peter M, MD  Active Self  pravastatin  (PRAVACHOL ) 10 MG tablet [id number]  TAKE 1 TABLET EVERY OTHER DAY, ALTERNATING WITH 2 TABLETS. Kennyth Worth HERO, MD  Active   tamsulosin  (FLOMAX ) 0.4 MG CAPS capsule [id number]  Take 1 capsule (0.4 mg total) by mouth daily after breakfast. Barrett, Erin R, [name]  Active   torsemide  (DEMADEX ) 20 MG tablet [id number]  Take 1 tablet (20 mg total) by mouth daily. Barrett, Rocky SAUNDERS, [name]  Active             Goals Addressed             This Visit's Progress    COMPLETED: [facility] Transitions of Care (TOC) Care Plan       Problems:  Recent Hospitalization for treatment of CAD and CABG x 4 Knowledge Deficit Related to CAD 05/18/24 spoke with pt, no new concerns reported, saw surgeon and  cardiologist on 05/12/24, had CXR, will follow up with surgeon on 12/31 and cardiologist in 3 months,  PT is working with pt 2 x per week 06/01/24- spoke with pt, no new concerns reported, will see surgeon 12/31 and have CXR, home health PT discharged pt yesterday, pt will discuss outpatient rehab with pt  Goal:  Over the next 30 days, the patient will not experience hospital readmission  Interventions:  CAD Interventions: Assessed understanding of CAD diagnosis Medications reviewed including medications utilized in CAD treatment plan Provided education on Importance of limiting foods high in cholesterol Reviewed Importance of taking all medications as  prescribed Reviewed Importance of attending all scheduled provider appointments Pain assessment completed Reviewed sternal precautions Reviewed signs /symptoms of infection Reviewed weight log, encouraged pt to continue keeping a log and take to provider appointments Reviewed plan of care with pt including case closure  Patient Self Care Activities:  Attend all scheduled provider appointments Call pharmacy for medication refills 3-7 days in advance of running out of medications Call provider office for new concerns or questions  Notify RN Care Manager of TOC call rescheduling needs Participate in Transition of Care Program/Attend TOC scheduled calls Perform all self care activities independently  Take medications as prescribed   Monitor for fluid weight gain, continue daily weights Follow up with outpatient rehab  Plan:  [facility] case closure        Recommendation:   PCP Follow-up Specialty provider follow-up surgeon 06/02/24  Follow Up Plan:   Closing From:  Transitions of Care Program  Mliss [facility], BSN RN Care Manager/ Transition of Care [place]/ [facility] [phone]     "

## 2024-06-02 ENCOUNTER — Ambulatory Visit: Payer: Self-pay

## 2024-06-02 ENCOUNTER — Ambulatory Visit (HOSPITAL_COMMUNITY)

## 2024-06-09 ENCOUNTER — Other Ambulatory Visit (HOSPITAL_COMMUNITY)

## 2024-06-10 ENCOUNTER — Ambulatory Visit: Payer: Self-pay

## 2024-06-10 ENCOUNTER — Ambulatory Visit (HOSPITAL_COMMUNITY)
Admission: RE | Admit: 2024-06-10 | Discharge: 2024-06-10 | Disposition: A | Discharge status: Home / Self Care | Source: Ambulatory Visit | Attending: Cardiovascular Disease | Admitting: Cardiovascular Disease

## 2024-06-10 VITALS — BP 153/76 | HR 84 | Resp 20 | Ht 67.0 in | Wt 221.8 lb

## 2024-06-10 DIAGNOSIS — Z951 Presence of aortocoronary bypass graft: Secondary | ICD-10-CM

## 2024-06-10 MED ORDER — AMOXICILLIN-POT CLAVULANATE 875-125 MG PO TABS: 1.0000 | ORAL_TABLET | Freq: Two times a day (BID) | ORAL | 0 refills | Status: AC

## 2024-06-10 MED ORDER — HYDROCORTISONE 2.5 % EX CREA: TOPICAL_CREAM | Freq: Two times a day (BID) | CUTANEOUS | 0 refills | Status: AC

## 2024-06-10 NOTE — Progress Notes (Signed)
 "     [address] [place] [address]             [phone]       HPI:  Patient returns for routine postoperative follow-up having undergone  Coronary artery bypass grafting x 4, Left internal mammary artery graft to the LAD, SVG to diagonal, SVG to OM, SVG to distal RCA and endoscopic vein harvest from the right leg on 04/26/2024 with Dr. Lucas.  The patient's early postoperative recovery while in the hospital was notable for having an increase in his creatinine. He was routinely diuresed and his creatinine was closely monitored. He has a few episodes of bradycardia. He one episode of paroxysmal atrial fibrillation and was started on amiodarone .  He converted back to normal sinus rhythm. He was stable for discharge home on 05/02/2024.   Since hospital discharge the patient reports that he has been doing well.  He has been using tylenol  as needed for pain. He has been active with walking is doing 20-30 minutes daily.  He has completed all of his home physical therapy. He does note that he has bilateral lower leg and torso rash that started one day ago.  He has used Cerave cream and hydrocortisone  cream to the rash which has helped.  The rash is itchy and has not spread. No new lotions, soaps or detergents.  He does walk outside but it is paved, he does not go into grassy/wooded areas. Denies chest pain, shortness of breath and lower leg edema.   Allergies as of 06/10/2024       Reactions   Prednisone Shortness Of Breath, Swelling   Can tolerate as an injection but not orally   Rosuvastatin  Other (See Comments)   myalgia   Crestor  [rosuvastatin  Calcium ] Other (See Comments)   myalgia   Gabapentin  Other (See Comments)   Lyrica  [pregabalin ]    swelling        Medication List        Accurate as of June 10, 2024 11:59 PM. If you have any questions, ask your nurse or doctor.          albuterol  108 (90 Base) MCG/ACT inhaler Commonly known as: VENTOLIN   HFA TAKE 2 PUFFS BY MOUTH EVERY 6 HOURS AS NEEDED FOR WHEEZE OR SHORTNESS OF BREATH   amiodarone  200 MG tablet Commonly known as: PACERONE  Take 1 tablet (200 mg total) by mouth 2 (two) times daily.   amLODipine  5 MG tablet Commonly known as: NORVASC  Take 1 tablet (5 mg total) by mouth daily.   amoxicillin -clavulanate 875-125 MG tablet Commonly known as: AUGMENTIN  Take 1 tablet by mouth 2 (two) times daily for 7 days. Started by: Manuelita CHRISTELLA Rough   aspirin  81 MG chewable tablet Chew 81 mg by mouth daily.   Azelastine  HCl 137 MCG/SPRAY Soln INSTILL 2 SPRAYS INTO BOTH NOSTRILS 2 TIMES DAILY AS DIRECTED What changed: See the new instructions.   cetirizine 10 MG tablet Commonly known as: ZYRTEC Take 10 mg by mouth daily.   esomeprazole  40 MG capsule Commonly known as: NEXIUM  TAKE 1 CAPSULE BY MOUTH DAILY BEFORE A MEAL   ezetimibe  10 MG tablet Commonly known as: ZETIA  TAKE 1 TABLET BY MOUTH EVERY DAY   fluticasone  50 MCG/ACT nasal spray Commonly known as: FLONASE  Place 2 sprays into both nostrils 2 (two) times daily. What changed:  when to take this reasons to take this   hydrocortisone  2.5 % cream Apply topically 2 (  two) times daily. Started by: Manuelita CHRISTELLA Rough   Integra Plus  Caps TAKE 1 CAPSULE BY MOUTH DAILY   LORazepam  0.5 MG tablet Commonly known as: Ativan  Take 0.5 mg daily as needed   Lumigan 0.01 % Soln Generic drug: bimatoprost Place 1 drop into both eyes at bedtime.   montelukast  10 MG tablet Commonly known as: SINGULAIR  TAKE 1 TABLET BY MOUTH EVERYDAY AT BEDTIME   multivitamin with minerals Tabs tablet Take 1 tablet by mouth daily. Adult 50+   nitroGLYCERIN  0.4 MG SL tablet Commonly known as: Nitrostat  Place 1 tablet (0.4 mg total) under the tongue every 5 (five) minutes as needed for chest pain (up to 3 doses. not within 24hrs of Viagra ).   oxyCODONE  5 MG immediate release tablet Commonly known as: Oxy IR/ROXICODONE  Take 1 tablet (5 mg  total) by mouth every 4 (four) hours as needed for severe pain (pain score 7-10).   potassium chloride  SA 20 MEQ tablet Commonly known as: KLOR-CON  M Take 1 tablet (20 mEq total) by mouth daily.   pravastatin  10 MG tablet Commonly known as: PRAVACHOL  TAKE 1 TABLET EVERY OTHER DAY, ALTERNATING WITH 2 TABLETS.   tamsulosin  0.4 MG Caps capsule Commonly known as: FLOMAX  Take 1 capsule (0.4 mg total) by mouth daily after breakfast.   torsemide  20 MG tablet Commonly known as: DEMADEX  Take 1 tablet (20 mg total) by mouth daily.   VITAMIN B 12 PO Take 1 tablet by mouth daily.         ROS Review of Systems  Constitutional: Negative.  Negative for fever and malaise/fatigue.  Respiratory:  Negative for cough and shortness of breath.   Cardiovascular:  Negative for chest pain, palpitations and leg swelling.  Skin:  Positive for itching and rash.     BP (!) 153/76 (BP Location: Right Arm, Patient Position: Sitting, Cuff Size: Normal)   Pulse 84   Resp 20   Ht 5' 7 (1.702 m)   Wt 221 lb 12.8 oz (100.6 kg)   SpO2 99% Comment: RA  BMI 34.74 kg/m   Physical Exam Constitutional:      Appearance: Normal appearance.  HENT:     Head: Normocephalic and atraumatic.  Cardiovascular:     Rate and Rhythm: Normal rate and regular rhythm.     Heart sounds: S1 normal and S2 normal. Murmur heard.     Systolic murmur is present with a grade of 2/6.  Pulmonary:     Effort: Pulmonary effort is normal.     Breath sounds: Normal breath sounds.  Skin:    General: Skin is warm and dry.     Findings: Rash present. Rash is macular.         Comments: Macular rash on bilateral lower legs and abdomen   Neurological:     General: No focal deficit present.     Mental Status: He is alert and oriented to person, place, and time.       Imaging: CLINICAL DATA:  CABG.   EXAM: CHEST - 2 VIEW   COMPARISON:  05/12/2024   FINDINGS: Lungs are adequately inflated without focal airspace  consolidation or effusion. Cardiomediastinal silhouette and remainder the exam is unchanged.   IMPRESSION: No active cardiopulmonary disease.     Electronically Signed   By: Toribio Agreste M.D.   On: 06/10/2024 14:18     Assessment/Plan:  S/P CABG x 4 -We reviewed today's chest x ray.   -We discussed driving and he is able to start  at this time.  First time driving should be a short distance in the daytime and he can increase from there -He is cleared from a surgical standpoint to participate in cardiac rehab.  However he will be undergoing a TAVR procedure in the next couple months and he would possibly like to defer rehab until after this. -We discussed the continuation of sternal precautions until a full 6 weeks from surgery, continue to not lift over 10 pounds until a full 3 months from surgery -Continue to increase activity/exercise as tolerated -New onset rash to bilateral lower legs and abdomen area.  Discussed possibilities of rash origin with patient.  He is to use hydrocortisone  cream at this time.  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 He is to reach out to our clinic if he needs a note to return  -He plans to undergo a TAVR once recovered from CABG. This would be around 3 months after CABG procedure.    Manuelita HERO [place], [name] 3:18 PM 06/11/2024  "

## 2024-06-11 NOTE — Patient Instructions (Signed)

## 2024-06-14 ENCOUNTER — Encounter: Payer: Self-pay | Admitting: *Deleted

## 2024-06-20 ENCOUNTER — Other Ambulatory Visit: Payer: Self-pay | Admitting: Physician Assistant

## 2024-06-24 ENCOUNTER — Other Ambulatory Visit: Payer: Self-pay | Admitting: Physician Assistant

## 2024-06-29 ENCOUNTER — Encounter: Payer: Self-pay | Admitting: Cardiology

## 2024-06-29 MED ORDER — AMIODARONE HCL 200 MG PO TABS: 200.0000 mg | ORAL_TABLET | Freq: Two times a day (BID) | ORAL | 1 refills | Status: AC

## 2024-07-02 ENCOUNTER — Other Ambulatory Visit: Payer: Self-pay

## 2024-07-02 DIAGNOSIS — I350 Nonrheumatic aortic (valve) stenosis: Secondary | ICD-10-CM

## 2024-07-04 ENCOUNTER — Other Ambulatory Visit: Payer: Self-pay | Admitting: Family Medicine

## 2024-07-05 ENCOUNTER — Ambulatory Visit: Payer: Medicare HMO

## 2024-07-08 ENCOUNTER — Ambulatory Visit: Payer: Self-pay | Admitting: Physician Assistant

## 2024-07-08 LAB — BASIC METABOLIC PANEL WITH GFR
BUN/Creatinine Ratio: 16 (ref 10–24)
BUN: 23 mg/dL (ref 8–27)
CO2: 22 mmol/L (ref 20–29)
Calcium: 9.2 mg/dL (ref 8.6–10.2)
Chloride: 99 mmol/L (ref 96–106)
Creatinine, Ser: 1.47 mg/dL — ABNORMAL HIGH (ref 0.76–1.27)
Glucose: 99 mg/dL (ref 70–99)
Potassium: 3.8 mmol/L (ref 3.5–5.2)
Sodium: 139 mmol/L (ref 134–144)
eGFR: 48 mL/min/{1.73_m2} — ABNORMAL LOW

## 2024-07-15 ENCOUNTER — Ambulatory Visit: Admitting: Cardiovascular Disease

## 2024-07-15 ENCOUNTER — Ambulatory Visit (HOSPITAL_COMMUNITY)

## 2024-08-03 ENCOUNTER — Ambulatory Visit: Admitting: Cardiology

## 2024-12-14 ENCOUNTER — Ambulatory Visit

## 2024-12-14 ENCOUNTER — Encounter: Admitting: Family Medicine
